# Patient Record
Sex: Female | Born: 1945 | Race: White | Hispanic: No | Smoking: Never smoker
Health system: Southern US, Community
[De-identification: ages and names within clinical notes are randomized; demographics above are authoritative.]

## PROBLEM LIST (undated history)

## (undated) DIAGNOSIS — R600 Localized edema: Secondary | ICD-10-CM

## (undated) DIAGNOSIS — B1910 Unspecified viral hepatitis B without hepatic coma: Secondary | ICD-10-CM

## (undated) DIAGNOSIS — J45909 Unspecified asthma, uncomplicated: Secondary | ICD-10-CM

## (undated) DIAGNOSIS — F419 Anxiety disorder, unspecified: Secondary | ICD-10-CM

## (undated) DIAGNOSIS — C801 Malignant (primary) neoplasm, unspecified: Secondary | ICD-10-CM

## (undated) DIAGNOSIS — K5790 Diverticulosis of intestine, part unspecified, without perforation or abscess without bleeding: Secondary | ICD-10-CM

## (undated) DIAGNOSIS — Z8719 Personal history of other diseases of the digestive system: Secondary | ICD-10-CM

## (undated) DIAGNOSIS — I10 Essential (primary) hypertension: Secondary | ICD-10-CM

## (undated) DIAGNOSIS — T7840XA Allergy, unspecified, initial encounter: Secondary | ICD-10-CM

## (undated) DIAGNOSIS — C8590 Non-Hodgkin lymphoma, unspecified, unspecified site: Secondary | ICD-10-CM

## (undated) DIAGNOSIS — I82629 Acute embolism and thrombosis of deep veins of unspecified upper extremity: Secondary | ICD-10-CM

## (undated) DIAGNOSIS — J069 Acute upper respiratory infection, unspecified: Secondary | ICD-10-CM

## (undated) DIAGNOSIS — K219 Gastro-esophageal reflux disease without esophagitis: Secondary | ICD-10-CM

## (undated) DIAGNOSIS — K259 Gastric ulcer, unspecified as acute or chronic, without hemorrhage or perforation: Secondary | ICD-10-CM

## (undated) HISTORY — DX: Allergy, unspecified, initial encounter: T78.40XA

## (undated) HISTORY — DX: Unspecified viral hepatitis B without hepatic coma: B19.10

## (undated) HISTORY — DX: Anxiety disorder, unspecified: F41.9

## (undated) HISTORY — PX: HEMORRHOID SURGERY: SHX153

## (undated) HISTORY — PX: PORTOCAVAL SHUNT PLACEMENT: SUR1035

## (undated) HISTORY — PX: COLONOSCOPY: SHX174

## (undated) HISTORY — DX: Acute upper respiratory infection, unspecified: J06.9

## (undated) HISTORY — DX: Essential (primary) hypertension: I10

## (undated) HISTORY — PX: CHOLECYSTECTOMY: SHX55

## (undated) HISTORY — PX: ESOPHAGOGASTRODUODENOSCOPY: SHX1529

---

## 1996-01-27 HISTORY — PX: LIVER BIOPSY: SHX301

## 2006-11-15 ENCOUNTER — Ambulatory Visit: Payer: Self-pay

## 2007-10-27 ENCOUNTER — Ambulatory Visit: Payer: Self-pay | Admitting: Oncology

## 2007-10-27 ENCOUNTER — Ambulatory Visit: Payer: Self-pay | Admitting: Internal Medicine

## 2007-10-27 DIAGNOSIS — K259 Gastric ulcer, unspecified as acute or chronic, without hemorrhage or perforation: Secondary | ICD-10-CM

## 2007-10-27 DIAGNOSIS — C8590 Non-Hodgkin lymphoma, unspecified, unspecified site: Secondary | ICD-10-CM | POA: Insufficient documentation

## 2007-10-27 HISTORY — DX: Gastric ulcer, unspecified as acute or chronic, without hemorrhage or perforation: K25.9

## 2007-11-10 ENCOUNTER — Inpatient Hospital Stay: Payer: Self-pay | Admitting: Internal Medicine

## 2007-11-10 ENCOUNTER — Other Ambulatory Visit: Payer: Self-pay

## 2007-11-27 ENCOUNTER — Ambulatory Visit: Payer: Self-pay | Admitting: Oncology

## 2007-12-05 ENCOUNTER — Ambulatory Visit: Payer: Self-pay | Admitting: Internal Medicine

## 2007-12-27 ENCOUNTER — Ambulatory Visit: Payer: Self-pay | Admitting: Oncology

## 2007-12-27 ENCOUNTER — Ambulatory Visit: Payer: Self-pay | Admitting: Internal Medicine

## 2008-01-04 ENCOUNTER — Emergency Department: Payer: Self-pay | Admitting: Emergency Medicine

## 2008-01-04 ENCOUNTER — Other Ambulatory Visit: Payer: Self-pay

## 2008-01-27 ENCOUNTER — Ambulatory Visit: Payer: Self-pay | Admitting: Internal Medicine

## 2008-01-27 ENCOUNTER — Ambulatory Visit: Payer: Self-pay | Admitting: Oncology

## 2008-02-15 ENCOUNTER — Inpatient Hospital Stay: Payer: Self-pay | Admitting: Internal Medicine

## 2008-02-27 ENCOUNTER — Ambulatory Visit: Payer: Self-pay | Admitting: Internal Medicine

## 2008-02-27 ENCOUNTER — Ambulatory Visit: Payer: Self-pay | Admitting: Oncology

## 2008-03-28 ENCOUNTER — Ambulatory Visit: Payer: Self-pay | Admitting: Internal Medicine

## 2008-03-28 ENCOUNTER — Ambulatory Visit: Payer: Self-pay | Admitting: Oncology

## 2008-04-13 ENCOUNTER — Emergency Department: Payer: Self-pay | Admitting: Emergency Medicine

## 2008-04-28 ENCOUNTER — Ambulatory Visit: Payer: Self-pay | Admitting: Oncology

## 2008-04-28 ENCOUNTER — Ambulatory Visit: Payer: Self-pay | Admitting: Internal Medicine

## 2008-05-27 ENCOUNTER — Ambulatory Visit: Payer: Self-pay | Admitting: Internal Medicine

## 2008-05-28 ENCOUNTER — Ambulatory Visit: Payer: Self-pay | Admitting: Internal Medicine

## 2008-06-18 ENCOUNTER — Emergency Department: Payer: Self-pay | Admitting: Emergency Medicine

## 2008-06-22 ENCOUNTER — Emergency Department: Payer: Self-pay | Admitting: Emergency Medicine

## 2008-06-28 ENCOUNTER — Ambulatory Visit: Payer: Self-pay | Admitting: Internal Medicine

## 2008-07-23 ENCOUNTER — Ambulatory Visit: Payer: Self-pay | Admitting: Unknown Physician Specialty

## 2008-07-29 ENCOUNTER — Ambulatory Visit: Payer: Self-pay | Admitting: Internal Medicine

## 2008-08-05 ENCOUNTER — Ambulatory Visit: Payer: Self-pay | Admitting: Unknown Physician Specialty

## 2008-08-26 ENCOUNTER — Ambulatory Visit: Payer: Self-pay | Admitting: Internal Medicine

## 2008-09-03 ENCOUNTER — Ambulatory Visit: Payer: Self-pay | Admitting: Family Medicine

## 2008-09-26 ENCOUNTER — Ambulatory Visit: Payer: Self-pay | Admitting: Internal Medicine

## 2008-09-30 ENCOUNTER — Ambulatory Visit: Payer: Self-pay | Admitting: Unknown Physician Specialty

## 2008-10-14 ENCOUNTER — Ambulatory Visit: Payer: Self-pay | Admitting: Unknown Physician Specialty

## 2008-10-26 ENCOUNTER — Ambulatory Visit: Payer: Self-pay | Admitting: Internal Medicine

## 2008-11-26 ENCOUNTER — Ambulatory Visit: Payer: Self-pay | Admitting: Internal Medicine

## 2009-01-26 ENCOUNTER — Ambulatory Visit: Payer: Self-pay | Admitting: Internal Medicine

## 2009-01-26 ENCOUNTER — Emergency Department: Payer: Self-pay | Admitting: Emergency Medicine

## 2009-02-11 ENCOUNTER — Ambulatory Visit: Payer: Self-pay | Admitting: Internal Medicine

## 2009-02-26 ENCOUNTER — Ambulatory Visit: Payer: Self-pay | Admitting: Internal Medicine

## 2009-03-11 ENCOUNTER — Ambulatory Visit: Payer: Self-pay | Admitting: Surgery

## 2009-03-26 ENCOUNTER — Ambulatory Visit: Payer: Self-pay | Admitting: Family Medicine

## 2009-03-28 ENCOUNTER — Ambulatory Visit: Payer: Self-pay | Admitting: Internal Medicine

## 2009-05-05 ENCOUNTER — Ambulatory Visit: Payer: Self-pay | Admitting: Internal Medicine

## 2009-05-14 ENCOUNTER — Ambulatory Visit: Payer: Self-pay | Admitting: Podiatry

## 2009-05-28 ENCOUNTER — Ambulatory Visit: Payer: Self-pay | Admitting: Internal Medicine

## 2009-06-05 ENCOUNTER — Ambulatory Visit: Payer: Self-pay | Admitting: Unknown Physician Specialty

## 2009-06-28 ENCOUNTER — Ambulatory Visit: Payer: Self-pay | Admitting: Internal Medicine

## 2009-07-29 ENCOUNTER — Ambulatory Visit: Payer: Self-pay | Admitting: Internal Medicine

## 2009-08-26 ENCOUNTER — Ambulatory Visit: Payer: Self-pay | Admitting: Internal Medicine

## 2009-09-03 ENCOUNTER — Ambulatory Visit: Payer: Self-pay | Admitting: Unknown Physician Specialty

## 2009-09-15 ENCOUNTER — Ambulatory Visit: Payer: Self-pay | Admitting: Internal Medicine

## 2009-09-26 ENCOUNTER — Ambulatory Visit: Payer: Self-pay | Admitting: Internal Medicine

## 2009-12-26 ENCOUNTER — Ambulatory Visit: Payer: Self-pay | Admitting: Internal Medicine

## 2010-01-19 ENCOUNTER — Ambulatory Visit: Payer: Self-pay | Admitting: Internal Medicine

## 2010-01-26 ENCOUNTER — Ambulatory Visit: Payer: Self-pay | Admitting: Internal Medicine

## 2010-07-27 ENCOUNTER — Ambulatory Visit: Payer: Self-pay | Admitting: Internal Medicine

## 2010-07-29 ENCOUNTER — Ambulatory Visit: Payer: Self-pay | Admitting: Internal Medicine

## 2010-10-05 ENCOUNTER — Ambulatory Visit: Payer: Self-pay | Admitting: Internal Medicine

## 2010-11-19 ENCOUNTER — Ambulatory Visit: Payer: Self-pay | Admitting: Family Medicine

## 2011-01-25 ENCOUNTER — Ambulatory Visit: Payer: Self-pay | Admitting: Internal Medicine

## 2011-01-27 ENCOUNTER — Ambulatory Visit: Payer: Self-pay | Admitting: Internal Medicine

## 2011-02-27 ENCOUNTER — Ambulatory Visit: Payer: Self-pay | Admitting: Internal Medicine

## 2011-03-27 DIAGNOSIS — B181 Chronic viral hepatitis B without delta-agent: Secondary | ICD-10-CM | POA: Insufficient documentation

## 2011-09-20 ENCOUNTER — Ambulatory Visit: Payer: Self-pay | Admitting: Internal Medicine

## 2011-09-20 LAB — CBC CANCER CENTER
Basophil #: 0 x10 3/mm (ref 0.0–0.1)
Basophil %: 0.3 %
Eosinophil #: 0.1 x10 3/mm (ref 0.0–0.7)
Eosinophil %: 2.1 %
HCT: 39 % (ref 35.0–47.0)
HGB: 13.4 g/dL (ref 12.0–16.0)
Lymphocyte #: 1.4 x10 3/mm (ref 1.0–3.6)
Lymphocyte %: 33.5 %
MCH: 32 pg (ref 26.0–34.0)
MCHC: 34.3 g/dL (ref 32.0–36.0)
MCV: 93 fL (ref 80–100)
Monocyte #: 0.4 x10 3/mm (ref 0.0–0.7)
Monocyte %: 8.8 %
Neutrophil #: 2.3 x10 3/mm (ref 1.4–6.5)
Neutrophil %: 55.3 %
Platelet: 164 x10 3/mm (ref 150–440)
RBC: 4.18 10*6/uL (ref 3.80–5.20)
RDW: 13.3 % (ref 11.5–14.5)
WBC: 4.1 x10 3/mm (ref 3.6–11.0)

## 2011-09-20 LAB — HEPATIC FUNCTION PANEL A (ARMC)
Albumin: 3.7 g/dL (ref 3.4–5.0)
Alkaline Phosphatase: 130 U/L (ref 50–136)
Bilirubin, Direct: 0.1 mg/dL (ref 0.00–0.20)
Bilirubin,Total: 0.2 mg/dL (ref 0.2–1.0)
SGOT(AST): 23 U/L (ref 15–37)
SGPT (ALT): 22 U/L
Total Protein: 7.2 g/dL (ref 6.4–8.2)

## 2011-09-20 LAB — LACTATE DEHYDROGENASE: LDH: 150 U/L (ref 84–246)

## 2011-09-20 LAB — CREATININE, SERUM
Creatinine: 0.89 mg/dL (ref 0.60–1.30)
EGFR (African American): >60
EGFR (Non-African Amer.): >60

## 2011-09-27 ENCOUNTER — Ambulatory Visit: Payer: Self-pay | Admitting: Internal Medicine

## 2012-01-20 ENCOUNTER — Ambulatory Visit: Payer: Self-pay | Admitting: Family Medicine

## 2012-01-29 ENCOUNTER — Emergency Department: Payer: Self-pay | Admitting: *Deleted

## 2012-03-20 ENCOUNTER — Ambulatory Visit: Payer: Self-pay | Admitting: Internal Medicine

## 2012-03-20 LAB — CBC CANCER CENTER
Basophil #: 0 x10 3/mm (ref 0.0–0.1)
Basophil %: 0.5 %
Eosinophil #: 0.1 x10 3/mm (ref 0.0–0.7)
Eosinophil %: 2.9 %
HCT: 42.3 % (ref 35.0–47.0)
HGB: 14 g/dL (ref 12.0–16.0)
Lymphocyte #: 1.4 x10 3/mm (ref 1.0–3.6)
Lymphocyte %: 32.9 %
MCH: 31 pg (ref 26.0–34.0)
MCHC: 33.1 g/dL (ref 32.0–36.0)
MCV: 94 fL (ref 80–100)
Monocyte #: 0.4 x10 3/mm (ref 0.2–0.9)
Monocyte %: 8.9 %
Neutrophil #: 2.3 x10 3/mm (ref 1.4–6.5)
Neutrophil %: 54.8 %
Platelet: 159 x10 3/mm (ref 150–440)
RBC: 4.52 10*6/uL (ref 3.80–5.20)
RDW: 12.7 % (ref 11.5–14.5)
WBC: 4.3 x10 3/mm (ref 3.6–11.0)

## 2012-03-20 LAB — HEPATIC FUNCTION PANEL A (ARMC)
Albumin: 3.8 g/dL (ref 3.4–5.0)
Alkaline Phosphatase: 131 U/L (ref 50–136)
Bilirubin, Direct: 0.1 mg/dL (ref 0.00–0.20)
Bilirubin,Total: 0.3 mg/dL (ref 0.2–1.0)
SGOT(AST): 31 U/L (ref 15–37)
SGPT (ALT): 28 U/L (ref 12–78)
Total Protein: 7.7 g/dL (ref 6.4–8.2)

## 2012-03-20 LAB — CREATININE, SERUM
Creatinine: 0.95 mg/dL (ref 0.60–1.30)
EGFR (African American): >60
EGFR (Non-African Amer.): >60

## 2012-03-20 LAB — LACTATE DEHYDROGENASE: LDH: 154 U/L (ref 81–246)

## 2012-03-28 ENCOUNTER — Ambulatory Visit: Payer: Self-pay | Admitting: Internal Medicine

## 2012-05-21 ENCOUNTER — Emergency Department: Payer: Self-pay | Admitting: Emergency Medicine

## 2012-05-24 ENCOUNTER — Emergency Department: Payer: Self-pay | Admitting: Emergency Medicine

## 2012-05-24 LAB — BASIC METABOLIC PANEL
Anion Gap: 6 — ABNORMAL LOW (ref 7–16)
BUN: 6 mg/dL — ABNORMAL LOW (ref 7–18)
Calcium, Total: 8.7 mg/dL (ref 8.5–10.1)
Chloride: 111 mmol/L — ABNORMAL HIGH (ref 98–107)
Co2: 24 mmol/L (ref 21–32)
Creatinine: 0.88 mg/dL (ref 0.60–1.30)
EGFR (African American): >60
EGFR (Non-African Amer.): >60
Glucose: 124 mg/dL — ABNORMAL HIGH (ref 65–99)
Osmolality: 280 (ref 275–301)
Potassium: 4.5 mmol/L (ref 3.5–5.1)
Sodium: 141 mmol/L (ref 136–145)

## 2012-05-24 LAB — CBC
HCT: 37.9 % (ref 35.0–47.0)
HGB: 12.8 g/dL (ref 12.0–16.0)
MCH: 31.1 pg (ref 26.0–34.0)
MCHC: 33.9 g/dL (ref 32.0–36.0)
MCV: 92 fL (ref 80–100)
Platelet: 145 10*3/uL — ABNORMAL LOW (ref 150–440)
RBC: 4.12 10*6/uL (ref 3.80–5.20)
RDW: 13.3 % (ref 11.5–14.5)
WBC: 3.5 10*3/uL — ABNORMAL LOW (ref 3.6–11.0)

## 2012-05-24 LAB — CK TOTAL AND CKMB (NOT AT ARMC)
CK, Total: 279 U/L — ABNORMAL HIGH (ref 21–215)
CK-MB: 1.6 ng/mL (ref 0.5–3.6)

## 2012-05-24 LAB — TROPONIN I: Troponin-I: <0.02 ng/mL

## 2012-10-26 ENCOUNTER — Ambulatory Visit: Payer: Self-pay | Admitting: Internal Medicine

## 2012-11-17 LAB — CBC CANCER CENTER
Basophil #: 0 x10 3/mm (ref 0.0–0.1)
Basophil %: 0.6 %
Eosinophil #: 0.2 x10 3/mm (ref 0.0–0.7)
Eosinophil %: 4.2 %
HCT: 39.3 % (ref 35.0–47.0)
HGB: 13.4 g/dL (ref 12.0–16.0)
Lymphocyte #: 1.1 x10 3/mm (ref 1.0–3.6)
Lymphocyte %: 27 %
MCH: 31.4 pg (ref 26.0–34.0)
MCHC: 34.1 g/dL (ref 32.0–36.0)
MCV: 92 fL (ref 80–100)
Monocyte #: 0.5 x10 3/mm (ref 0.2–0.9)
Monocyte %: 11.4 %
Neutrophil #: 2.3 x10 3/mm (ref 1.4–6.5)
Neutrophil %: 56.8 %
Platelet: 176 x10 3/mm (ref 150–440)
RBC: 4.28 10*6/uL (ref 3.80–5.20)
RDW: 12.8 % (ref 11.5–14.5)
WBC: 4.1 x10 3/mm (ref 3.6–11.0)

## 2012-11-17 LAB — HEPATIC FUNCTION PANEL A (ARMC)
Albumin: 3.6 g/dL (ref 3.4–5.0)
Alkaline Phosphatase: 129 U/L (ref 50–136)
Bilirubin, Direct: 0.1 mg/dL (ref 0.00–0.20)
Bilirubin,Total: 0.3 mg/dL (ref 0.2–1.0)
SGOT(AST): 22 U/L (ref 15–37)
SGPT (ALT): 22 U/L (ref 12–78)
Total Protein: 7.3 g/dL (ref 6.4–8.2)

## 2012-11-17 LAB — CREATININE, SERUM
Creatinine: 0.97 mg/dL (ref 0.60–1.30)
EGFR (African American): >60
EGFR (Non-African Amer.): >60

## 2012-11-17 LAB — LACTATE DEHYDROGENASE: LDH: 153 U/L (ref 81–246)

## 2012-11-26 ENCOUNTER — Ambulatory Visit: Payer: Self-pay | Admitting: Internal Medicine

## 2013-01-23 ENCOUNTER — Ambulatory Visit: Payer: Self-pay | Admitting: Family Medicine

## 2013-02-09 ENCOUNTER — Emergency Department: Payer: Self-pay | Admitting: Emergency Medicine

## 2013-07-30 ENCOUNTER — Ambulatory Visit: Payer: Self-pay | Admitting: Internal Medicine

## 2013-07-31 LAB — CBC CANCER CENTER
Basophil #: 0 x10 3/mm (ref 0.0–0.1)
Basophil %: 0.5 %
Eosinophil #: 0.1 x10 3/mm (ref 0.0–0.7)
Eosinophil %: 1.9 %
HCT: 38.7 % (ref 35.0–47.0)
HGB: 12.9 g/dL (ref 12.0–16.0)
Lymphocyte #: 1.1 x10 3/mm (ref 1.0–3.6)
Lymphocyte %: 22.8 %
MCH: 30.5 pg (ref 26.0–34.0)
MCHC: 33.4 g/dL (ref 32.0–36.0)
MCV: 92 fL (ref 80–100)
Monocyte #: 0.5 x10 3/mm (ref 0.2–0.9)
Monocyte %: 9.8 %
Neutrophil #: 3 x10 3/mm (ref 1.4–6.5)
Neutrophil %: 65 %
Platelet: 168 x10 3/mm (ref 150–440)
RBC: 4.22 10*6/uL (ref 3.80–5.20)
RDW: 12.9 % (ref 11.5–14.5)
WBC: 4.6 x10 3/mm (ref 3.6–11.0)

## 2013-07-31 LAB — PROTIME-INR
INR: 1
Prothrombin Time: 12.6 secs (ref 11.5–14.7)

## 2013-07-31 LAB — HEPATIC FUNCTION PANEL A (ARMC)
Albumin: 3.4 g/dL (ref 3.4–5.0)
Alkaline Phosphatase: 111 U/L
Bilirubin, Direct: 0.1 mg/dL (ref 0.00–0.20)
Bilirubin,Total: 0.3 mg/dL (ref 0.2–1.0)
SGOT(AST): 23 U/L (ref 15–37)
SGPT (ALT): 19 U/L (ref 12–78)
Total Protein: 6.5 g/dL (ref 6.4–8.2)

## 2013-07-31 LAB — CREATININE, SERUM
Creatinine: 1.14 mg/dL (ref 0.60–1.30)
EGFR (African American): 58 — ABNORMAL LOW
EGFR (Non-African Amer.): 50 — ABNORMAL LOW

## 2013-07-31 LAB — LACTATE DEHYDROGENASE: LDH: 295 U/L — ABNORMAL HIGH (ref 81–246)

## 2013-07-31 LAB — APTT: Activated PTT: 28 secs (ref 23.6–35.9)

## 2013-08-06 ENCOUNTER — Ambulatory Visit: Payer: Self-pay | Admitting: Internal Medicine

## 2013-08-13 ENCOUNTER — Ambulatory Visit: Payer: Self-pay | Admitting: Surgery

## 2013-08-13 LAB — CBC WITH DIFFERENTIAL/PLATELET
Basophil #: 0 10*3/uL (ref 0.0–0.1)
Basophil %: 0.8 %
Eosinophil #: 0.1 10*3/uL (ref 0.0–0.7)
Eosinophil %: 2.4 %
HCT: 36.4 % (ref 35.0–47.0)
HGB: 12.6 g/dL (ref 12.0–16.0)
Lymphocyte #: 1.2 10*3/uL (ref 1.0–3.6)
Lymphocyte %: 25.9 %
MCH: 31.9 pg (ref 26.0–34.0)
MCHC: 34.6 g/dL (ref 32.0–36.0)
MCV: 92 fL (ref 80–100)
Monocyte #: 0.4 x10 3/mm (ref 0.2–0.9)
Monocyte %: 9.4 %
Neutrophil #: 2.9 10*3/uL (ref 1.4–6.5)
Neutrophil %: 61.5 %
Platelet: 199 10*3/uL (ref 150–440)
RBC: 3.96 10*6/uL (ref 3.80–5.20)
RDW: 13.2 % (ref 11.5–14.5)
WBC: 4.7 10*3/uL (ref 3.6–11.0)

## 2013-08-17 ENCOUNTER — Ambulatory Visit: Payer: Self-pay | Admitting: Surgery

## 2013-08-24 LAB — PATHOLOGY REPORT

## 2013-08-26 ENCOUNTER — Ambulatory Visit: Payer: Self-pay | Admitting: Internal Medicine

## 2013-08-29 LAB — CBC CANCER CENTER
Basophil: 2 %
Comment - H1-Com1: NORMAL
Eosinophil: 4 %
HCT: 37.8 % (ref 35.0–47.0)
HGB: 12.3 g/dL (ref 12.0–16.0)
Lymphocytes: 19 %
MCH: 29.8 pg (ref 26.0–34.0)
MCHC: 32.6 g/dL (ref 32.0–36.0)
MCV: 91 fL (ref 80–100)
Monocytes: 7 %
Platelet: 212 x10 3/mm (ref 150–440)
RBC: 4.14 10*6/uL (ref 3.80–5.20)
RDW: 12.7 % (ref 11.5–14.5)
Segmented Neutrophils: 64 %
Variant Lymphocyte: 4 %
WBC: 5.3 x10 3/mm (ref 3.6–11.0)

## 2013-09-03 LAB — CBC CANCER CENTER
Basophil #: 0 x10 3/mm (ref 0.0–0.1)
Basophil %: 0.6 %
Eosinophil #: 0.1 x10 3/mm (ref 0.0–0.7)
Eosinophil %: 1.5 %
HCT: 35.4 % (ref 35.0–47.0)
HGB: 12 g/dL (ref 12.0–16.0)
Lymphocyte #: 1.4 x10 3/mm (ref 1.0–3.6)
Lymphocyte %: 28 %
MCH: 30.8 pg (ref 26.0–34.0)
MCHC: 33.9 g/dL (ref 32.0–36.0)
MCV: 91 fL (ref 80–100)
Monocyte #: 0.6 x10 3/mm (ref 0.2–0.9)
Monocyte %: 12.5 %
Neutrophil #: 2.8 x10 3/mm (ref 1.4–6.5)
Neutrophil %: 57.4 %
Platelet: 225 x10 3/mm (ref 150–440)
RBC: 3.91 10*6/uL (ref 3.80–5.20)
RDW: 12.6 % (ref 11.5–14.5)
WBC: 4.9 x10 3/mm (ref 3.6–11.0)

## 2013-09-03 LAB — COMPREHENSIVE METABOLIC PANEL
Albumin: 3.2 g/dL — ABNORMAL LOW (ref 3.4–5.0)
Alkaline Phosphatase: 136 U/L — ABNORMAL HIGH
Anion Gap: 10 (ref 7–16)
BUN: 16 mg/dL (ref 7–18)
Bilirubin,Total: 0.3 mg/dL (ref 0.2–1.0)
Calcium, Total: 9.4 mg/dL (ref 8.5–10.1)
Chloride: 98 mmol/L (ref 98–107)
Co2: 30 mmol/L (ref 21–32)
Creatinine: 1.01 mg/dL (ref 0.60–1.30)
EGFR (African American): >60
EGFR (Non-African Amer.): 58 — ABNORMAL LOW
Glucose: 104 mg/dL — ABNORMAL HIGH (ref 65–99)
Osmolality: 277 (ref 275–301)
Potassium: 4 mmol/L (ref 3.5–5.1)
SGOT(AST): 22 U/L (ref 15–37)
SGPT (ALT): 12 U/L (ref 12–78)
Sodium: 138 mmol/L (ref 136–145)
Total Protein: 7 g/dL (ref 6.4–8.2)

## 2013-09-03 LAB — URIC ACID: Uric Acid: 3.7 mg/dL (ref 2.6–6.0)

## 2013-09-06 LAB — URINALYSIS, COMPLETE
Bacteria: NONE SEEN
Bilirubin,UR: NEGATIVE
Blood: NEGATIVE
Glucose,UR: NEGATIVE mg/dL (ref 0–75)
Hyaline Cast: 1
Ketone: NEGATIVE
Nitrite: NEGATIVE
Ph: 6 (ref 4.5–8.0)
Protein: NEGATIVE
RBC,UR: 1 /HPF (ref 0–5)
Specific Gravity: 1.023 (ref 1.003–1.030)
Squamous Epithelial: 2
WBC UR: 7 /HPF (ref 0–5)

## 2013-09-07 LAB — URINALYSIS, COMPLETE
Bacteria: NONE SEEN
Bilirubin,UR: NEGATIVE
Blood: NEGATIVE
Glucose,UR: NEGATIVE mg/dL (ref 0–75)
Hyaline Cast: 3
Nitrite: NEGATIVE
Ph: 5 (ref 4.5–8.0)
Protein: NEGATIVE
RBC,UR: 1 /HPF (ref 0–5)
Specific Gravity: 1.03 (ref 1.003–1.030)
Squamous Epithelial: 3
WBC UR: 7 /HPF (ref 0–5)

## 2013-09-10 LAB — CBC CANCER CENTER
Basophil #: 0 x10 3/mm (ref 0.0–0.1)
Basophil %: 0.1 %
Eosinophil #: 0.1 x10 3/mm (ref 0.0–0.7)
Eosinophil %: 0.5 %
HCT: 40.4 % (ref 35.0–47.0)
HGB: 13.2 g/dL (ref 12.0–16.0)
Lymphocyte #: 0.7 x10 3/mm — ABNORMAL LOW (ref 1.0–3.6)
Lymphocyte %: 5.2 %
MCH: 29.8 pg (ref 26.0–34.0)
MCHC: 32.6 g/dL (ref 32.0–36.0)
MCV: 91 fL (ref 80–100)
Monocyte #: 0.1 x10 3/mm — ABNORMAL LOW (ref 0.2–0.9)
Monocyte %: 0.9 %
Neutrophil #: 12 x10 3/mm — ABNORMAL HIGH (ref 1.4–6.5)
Neutrophil %: 93.3 %
Platelet: 227 x10 3/mm (ref 150–440)
RBC: 4.41 10*6/uL (ref 3.80–5.20)
RDW: 12.7 % (ref 11.5–14.5)
WBC: 12.9 x10 3/mm — ABNORMAL HIGH (ref 3.6–11.0)

## 2013-09-10 LAB — BASIC METABOLIC PANEL
Anion Gap: 6 — ABNORMAL LOW (ref 7–16)
BUN: 24 mg/dL — ABNORMAL HIGH (ref 7–18)
Calcium, Total: 10.3 mg/dL — ABNORMAL HIGH (ref 8.5–10.1)
Chloride: 100 mmol/L (ref 98–107)
Co2: 27 mmol/L (ref 21–32)
Creatinine: 1.25 mg/dL (ref 0.60–1.30)
EGFR (African American): 52 — ABNORMAL LOW
EGFR (Non-African Amer.): 45 — ABNORMAL LOW
Glucose: 104 mg/dL — ABNORMAL HIGH (ref 65–99)
Osmolality: 271 (ref 275–301)
Potassium: 4.1 mmol/L (ref 3.5–5.1)
Sodium: 133 mmol/L — ABNORMAL LOW (ref 136–145)

## 2013-09-10 LAB — URIC ACID: Uric Acid: 4 mg/dL (ref 2.6–6.0)

## 2013-09-13 LAB — BASIC METABOLIC PANEL
Anion Gap: 9 (ref 7–16)
BUN: 20 mg/dL — ABNORMAL HIGH (ref 7–18)
Calcium, Total: 8.8 mg/dL (ref 8.5–10.1)
Chloride: 97 mmol/L — ABNORMAL LOW (ref 98–107)
Co2: 29 mmol/L (ref 21–32)
Creatinine: 1 mg/dL (ref 0.60–1.30)
EGFR (African American): >60
EGFR (Non-African Amer.): 59 — ABNORMAL LOW
Glucose: 110 mg/dL — ABNORMAL HIGH (ref 65–99)
Osmolality: 273 (ref 275–301)
Potassium: 3.9 mmol/L (ref 3.5–5.1)
Sodium: 135 mmol/L — ABNORMAL LOW (ref 136–145)

## 2013-09-17 LAB — CBC CANCER CENTER
Basophil #: 0 x10 3/mm (ref 0.0–0.1)
Basophil %: 0.1 %
Eosinophil #: 0 x10 3/mm (ref 0.0–0.7)
Eosinophil %: 0.1 %
HCT: 30.2 % — ABNORMAL LOW (ref 35.0–47.0)
HGB: 10 g/dL — ABNORMAL LOW (ref 12.0–16.0)
Lymphocyte #: 0.7 x10 3/mm — ABNORMAL LOW (ref 1.0–3.6)
Lymphocyte %: 12.4 %
MCH: 29.9 pg (ref 26.0–34.0)
MCHC: 33.1 g/dL (ref 32.0–36.0)
MCV: 90 fL (ref 80–100)
Monocyte #: 0.9 x10 3/mm (ref 0.2–0.9)
Monocyte %: 17.5 %
Neutrophil #: 3.8 x10 3/mm (ref 1.4–6.5)
Neutrophil %: 69.9 %
Platelet: 31 x10 3/mm — ABNORMAL LOW (ref 150–440)
RBC: 3.35 10*6/uL — ABNORMAL LOW (ref 3.80–5.20)
RDW: 12.3 % (ref 11.5–14.5)
WBC: 5.4 x10 3/mm (ref 3.6–11.0)

## 2013-09-17 LAB — BASIC METABOLIC PANEL
Anion Gap: 9 (ref 7–16)
BUN: 10 mg/dL (ref 7–18)
Calcium, Total: 8.9 mg/dL (ref 8.5–10.1)
Chloride: 102 mmol/L (ref 98–107)
Co2: 29 mmol/L (ref 21–32)
Creatinine: 0.81 mg/dL (ref 0.60–1.30)
EGFR (African American): >60
EGFR (Non-African Amer.): >60
Glucose: 114 mg/dL — ABNORMAL HIGH (ref 65–99)
Osmolality: 279 (ref 275–301)
Potassium: 3.7 mmol/L (ref 3.5–5.1)
Sodium: 140 mmol/L (ref 136–145)

## 2013-09-17 LAB — HEPATIC FUNCTION PANEL A (ARMC)
Albumin: 2.9 g/dL — ABNORMAL LOW (ref 3.4–5.0)
Alkaline Phosphatase: 122 U/L — ABNORMAL HIGH
Bilirubin, Direct: <0.1 mg/dL (ref 0.00–0.20)
Bilirubin,Total: <0.1 mg/dL — ABNORMAL LOW (ref 0.2–1.0)
SGOT(AST): 12 U/L — ABNORMAL LOW (ref 15–37)
SGPT (ALT): 16 U/L (ref 12–78)
Total Protein: 6.7 g/dL (ref 6.4–8.2)

## 2013-09-17 LAB — MAGNESIUM: Magnesium: 1.9 mg/dL

## 2013-09-24 LAB — CBC CANCER CENTER
Basophil #: 0 x10 3/mm (ref 0.0–0.1)
Basophil %: 0.3 %
Eosinophil #: 0 x10 3/mm (ref 0.0–0.7)
Eosinophil %: 0.1 %
HCT: 28.9 % — ABNORMAL LOW (ref 35.0–47.0)
HGB: 9.8 g/dL — ABNORMAL LOW (ref 12.0–16.0)
Lymphocyte #: 1.1 x10 3/mm (ref 1.0–3.6)
Lymphocyte %: 15.1 %
MCH: 30.4 pg (ref 26.0–34.0)
MCHC: 33.9 g/dL (ref 32.0–36.0)
MCV: 90 fL (ref 80–100)
Monocyte #: 1.2 x10 3/mm — ABNORMAL HIGH (ref 0.2–0.9)
Monocyte %: 16.3 %
Neutrophil #: 4.9 x10 3/mm (ref 1.4–6.5)
Neutrophil %: 68.2 %
Platelet: 303 x10 3/mm (ref 150–440)
RBC: 3.22 10*6/uL — ABNORMAL LOW (ref 3.80–5.20)
RDW: 12.5 % (ref 11.5–14.5)
WBC: 7.2 x10 3/mm (ref 3.6–11.0)

## 2013-09-24 LAB — BASIC METABOLIC PANEL
Anion Gap: 8 (ref 7–16)
BUN: 12 mg/dL (ref 7–18)
Calcium, Total: 9.1 mg/dL (ref 8.5–10.1)
Chloride: 102 mmol/L (ref 98–107)
Co2: 29 mmol/L (ref 21–32)
Creatinine: 0.83 mg/dL (ref 0.60–1.30)
EGFR (African American): >60
EGFR (Non-African Amer.): >60
Glucose: 110 mg/dL — ABNORMAL HIGH (ref 65–99)
Osmolality: 278 (ref 275–301)
Potassium: 4 mmol/L (ref 3.5–5.1)
Sodium: 139 mmol/L (ref 136–145)

## 2013-09-26 ENCOUNTER — Ambulatory Visit: Payer: Self-pay | Admitting: Internal Medicine

## 2013-09-27 ENCOUNTER — Inpatient Hospital Stay: Payer: Self-pay | Admitting: Internal Medicine

## 2013-09-27 LAB — URINALYSIS, COMPLETE
Bacteria: NONE SEEN
Bilirubin,UR: NEGATIVE
Blood: NEGATIVE
Glucose,UR: NEGATIVE mg/dL (ref 0–75)
Ketone: NEGATIVE
Leukocyte Esterase: NEGATIVE
Nitrite: NEGATIVE
Ph: 7 (ref 4.5–8.0)
Protein: 100
RBC,UR: <1 /HPF (ref 0–5)
Specific Gravity: 1.018 (ref 1.003–1.030)
Squamous Epithelial: 1
WBC UR: 7 /HPF (ref 0–5)

## 2013-09-27 LAB — CBC WITH DIFFERENTIAL/PLATELET
Basophil #: 0 10*3/uL (ref 0.0–0.1)
Basophil %: 0.5 %
Eosinophil #: 0 10*3/uL (ref 0.0–0.7)
Eosinophil %: 0.1 %
HCT: 29.3 % — ABNORMAL LOW (ref 35.0–47.0)
HGB: 9.9 g/dL — ABNORMAL LOW (ref 12.0–16.0)
Lymphocyte #: 0.1 10*3/uL — ABNORMAL LOW (ref 1.0–3.6)
Lymphocyte %: 1.3 %
MCH: 30.5 pg (ref 26.0–34.0)
MCHC: 33.6 g/dL (ref 32.0–36.0)
MCV: 91 fL (ref 80–100)
Monocyte #: 0.1 x10 3/mm — ABNORMAL LOW (ref 0.2–0.9)
Monocyte %: 1.3 %
Neutrophil #: 8.2 10*3/uL — ABNORMAL HIGH (ref 1.4–6.5)
Neutrophil %: 96.8 %
Platelet: 553 10*3/uL — ABNORMAL HIGH (ref 150–440)
RBC: 3.23 10*6/uL — ABNORMAL LOW (ref 3.80–5.20)
RDW: 12.5 % (ref 11.5–14.5)
WBC: 8.5 10*3/uL (ref 3.6–11.0)

## 2013-09-27 LAB — COMPREHENSIVE METABOLIC PANEL
Albumin: 3 g/dL — ABNORMAL LOW (ref 3.4–5.0)
Alkaline Phosphatase: 104 U/L
Anion Gap: 5 — ABNORMAL LOW (ref 7–16)
BUN: 24 mg/dL — ABNORMAL HIGH (ref 7–18)
Bilirubin,Total: 0.2 mg/dL (ref 0.2–1.0)
Calcium, Total: 8.8 mg/dL (ref 8.5–10.1)
Chloride: 104 mmol/L (ref 98–107)
Co2: 28 mmol/L (ref 21–32)
Creatinine: 0.83 mg/dL (ref 0.60–1.30)
EGFR (African American): >60
EGFR (Non-African Amer.): >60
Glucose: 106 mg/dL — ABNORMAL HIGH (ref 65–99)
Osmolality: 278 (ref 275–301)
Potassium: 3.3 mmol/L — ABNORMAL LOW (ref 3.5–5.1)
SGOT(AST): 32 U/L (ref 15–37)
SGPT (ALT): 28 U/L (ref 12–78)
Sodium: 137 mmol/L (ref 136–145)
Total Protein: 6.5 g/dL (ref 6.4–8.2)

## 2013-09-27 LAB — TROPONIN I: Troponin-I: <0.02 ng/mL

## 2013-09-27 LAB — LIPASE, BLOOD: Lipase: 93 U/L (ref 73–393)

## 2013-09-28 LAB — BASIC METABOLIC PANEL
Anion Gap: 6 — ABNORMAL LOW (ref 7–16)
BUN: 18 mg/dL (ref 7–18)
Calcium, Total: 8.3 mg/dL — ABNORMAL LOW (ref 8.5–10.1)
Chloride: 109 mmol/L — ABNORMAL HIGH (ref 98–107)
Co2: 25 mmol/L (ref 21–32)
Creatinine: 1 mg/dL (ref 0.60–1.30)
EGFR (African American): >60
EGFR (Non-African Amer.): 59 — ABNORMAL LOW
Glucose: 77 mg/dL (ref 65–99)
Osmolality: 280 (ref 275–301)
Potassium: 3.8 mmol/L (ref 3.5–5.1)
Sodium: 140 mmol/L (ref 136–145)

## 2013-09-28 LAB — MAGNESIUM: Magnesium: 1.9 mg/dL

## 2013-09-29 LAB — URINE CULTURE

## 2013-09-29 LAB — BASIC METABOLIC PANEL
Anion Gap: 7 (ref 7–16)
BUN: 10 mg/dL (ref 7–18)
Calcium, Total: 8.4 mg/dL — ABNORMAL LOW (ref 8.5–10.1)
Chloride: 114 mmol/L — ABNORMAL HIGH (ref 98–107)
Co2: 21 mmol/L (ref 21–32)
Creatinine: 0.98 mg/dL (ref 0.60–1.30)
EGFR (African American): >60
EGFR (Non-African Amer.): >60
Glucose: 83 mg/dL (ref 65–99)
Osmolality: 281 (ref 275–301)
Potassium: 3.7 mmol/L (ref 3.5–5.1)
Sodium: 142 mmol/L (ref 136–145)

## 2013-09-29 LAB — MAGNESIUM: Magnesium: 1.7 mg/dL — ABNORMAL LOW

## 2013-09-30 LAB — MAGNESIUM: Magnesium: 1.8 mg/dL

## 2013-10-01 LAB — BASIC METABOLIC PANEL
Anion Gap: 11 (ref 7–16)
BUN: 16 mg/dL (ref 7–18)
Calcium, Total: 8.6 mg/dL (ref 8.5–10.1)
Chloride: 102 mmol/L (ref 98–107)
Co2: 23 mmol/L (ref 21–32)
Creatinine: 0.95 mg/dL (ref 0.60–1.30)
EGFR (African American): >60
EGFR (Non-African Amer.): >60
Glucose: 108 mg/dL — ABNORMAL HIGH (ref 65–99)
Osmolality: 274 (ref 275–301)
Potassium: 3.8 mmol/L (ref 3.5–5.1)
Sodium: 136 mmol/L (ref 136–145)

## 2013-10-01 LAB — CBC CANCER CENTER
Basophil #: 0 x10 3/mm (ref 0.0–0.1)
Basophil %: 1 %
Eosinophil #: 0 x10 3/mm (ref 0.0–0.7)
Eosinophil %: 0.4 %
HCT: 28.1 % — ABNORMAL LOW (ref 35.0–47.0)
HGB: 9.5 g/dL — ABNORMAL LOW (ref 12.0–16.0)
Lymphocyte #: 0.4 x10 3/mm — ABNORMAL LOW (ref 1.0–3.6)
Lymphocyte %: 21.3 %
MCH: 30.1 pg (ref 26.0–34.0)
MCHC: 33.7 g/dL (ref 32.0–36.0)
MCV: 89 fL (ref 80–100)
Monocyte #: 0 x10 3/mm — ABNORMAL LOW (ref 0.2–0.9)
Monocyte %: 1.3 %
Neutrophil #: 1.4 x10 3/mm (ref 1.4–6.5)
Neutrophil %: 76 %
Platelet: 263 x10 3/mm (ref 150–440)
RBC: 3.14 10*6/uL — ABNORMAL LOW (ref 3.80–5.20)
RDW: 12.7 % (ref 11.5–14.5)
WBC: 1.8 x10 3/mm — CL (ref 3.6–11.0)

## 2013-10-03 LAB — CULTURE, BLOOD (SINGLE)

## 2013-10-08 LAB — CBC CANCER CENTER
Basophil #: 0 x10 3/mm (ref 0.0–0.1)
Basophil %: 0.1 %
Eosinophil #: 0 x10 3/mm (ref 0.0–0.7)
Eosinophil %: 1.3 %
HCT: 21.1 % — ABNORMAL LOW (ref 35.0–47.0)
HGB: 7.2 g/dL — ABNORMAL LOW (ref 12.0–16.0)
Lymphocyte #: 0.3 x10 3/mm — ABNORMAL LOW (ref 1.0–3.6)
Lymphocyte %: 34.9 %
MCH: 29.8 pg (ref 26.0–34.0)
MCHC: 34.1 g/dL (ref 32.0–36.0)
MCV: 87 fL (ref 80–100)
Monocyte #: 0.5 x10 3/mm (ref 0.2–0.9)
Monocyte %: 49.8 %
Neutrophil #: 0.1 x10 3/mm — ABNORMAL LOW (ref 1.4–6.5)
Neutrophil %: 13.9 %
Platelet: 12 x10 3/mm — CL (ref 150–440)
RBC: 2.41 10*6/uL — ABNORMAL LOW (ref 3.80–5.20)
RDW: 12.2 % (ref 11.5–14.5)
WBC: 1 x10 3/mm — CL (ref 3.6–11.0)

## 2013-10-08 LAB — HEPATIC FUNCTION PANEL A (ARMC)
Albumin: 2.9 g/dL — ABNORMAL LOW (ref 3.4–5.0)
Alkaline Phosphatase: 101 U/L
Bilirubin, Direct: 0.1 mg/dL (ref 0.00–0.20)
Bilirubin,Total: 0.3 mg/dL (ref 0.2–1.0)
SGOT(AST): 11 U/L — ABNORMAL LOW (ref 15–37)
SGPT (ALT): 23 U/L (ref 12–78)
Total Protein: 6.8 g/dL (ref 6.4–8.2)

## 2013-10-08 LAB — BASIC METABOLIC PANEL
Anion Gap: 8 (ref 7–16)
BUN: 10 mg/dL (ref 7–18)
Calcium, Total: 9.1 mg/dL (ref 8.5–10.1)
Chloride: 95 mmol/L — ABNORMAL LOW (ref 98–107)
Co2: 29 mmol/L (ref 21–32)
Creatinine: 0.86 mg/dL (ref 0.60–1.30)
EGFR (African American): >60
EGFR (Non-African Amer.): >60
Glucose: 95 mg/dL (ref 65–99)
Osmolality: 263 (ref 275–301)
Potassium: 4.2 mmol/L (ref 3.5–5.1)
Sodium: 132 mmol/L — ABNORMAL LOW (ref 136–145)

## 2013-10-08 LAB — PROTIME-INR
INR: 1
Prothrombin Time: 13.2 secs (ref 11.5–14.7)

## 2013-10-08 LAB — MAGNESIUM: Magnesium: 2.1 mg/dL

## 2013-10-10 LAB — CBC CANCER CENTER
Basophil #: 0 x10 3/mm (ref 0.0–0.1)
Basophil %: 0.1 %
Eosinophil #: 0 x10 3/mm (ref 0.0–0.7)
Eosinophil %: 0.1 %
HCT: 21.1 % — ABNORMAL LOW (ref 35.0–47.0)
HGB: 7.1 g/dL — ABNORMAL LOW (ref 12.0–16.0)
Lymphocyte #: 0.4 x10 3/mm — ABNORMAL LOW (ref 1.0–3.6)
Lymphocyte %: 20.3 %
MCH: 30 pg (ref 26.0–34.0)
MCHC: 33.7 g/dL (ref 32.0–36.0)
MCV: 89 fL (ref 80–100)
Monocyte #: 0.7 x10 3/mm (ref 0.2–0.9)
Monocyte %: 34.5 %
Neutrophil #: 0.9 x10 3/mm — ABNORMAL LOW (ref 1.4–6.5)
Neutrophil %: 45 %
Platelet: 30 x10 3/mm — CL (ref 150–440)
RBC: 2.37 10*6/uL — ABNORMAL LOW (ref 3.80–5.20)
RDW: 12.2 % (ref 11.5–14.5)
WBC: 2 x10 3/mm — CL (ref 3.6–11.0)

## 2013-10-15 LAB — BASIC METABOLIC PANEL
Anion Gap: 7 (ref 7–16)
BUN: 11 mg/dL (ref 7–18)
Calcium, Total: 9.2 mg/dL (ref 8.5–10.1)
Chloride: 103 mmol/L (ref 98–107)
Co2: 31 mmol/L (ref 21–32)
Creatinine: 0.87 mg/dL (ref 0.60–1.30)
EGFR (African American): >60
EGFR (Non-African Amer.): >60
Glucose: 101 mg/dL — ABNORMAL HIGH (ref 65–99)
Osmolality: 281 (ref 275–301)
Potassium: 4.4 mmol/L (ref 3.5–5.1)
Sodium: 141 mmol/L (ref 136–145)

## 2013-10-15 LAB — CBC CANCER CENTER
Basophil #: 0 x10 3/mm (ref 0.0–0.1)
Basophil %: 0.2 %
Eosinophil #: 0 x10 3/mm (ref 0.0–0.7)
Eosinophil %: 0 %
HCT: 26.3 % — ABNORMAL LOW (ref 35.0–47.0)
HGB: 8.8 g/dL — ABNORMAL LOW (ref 12.0–16.0)
Lymphocyte #: 0.7 x10 3/mm — ABNORMAL LOW (ref 1.0–3.6)
Lymphocyte %: 25.8 %
MCH: 30 pg (ref 26.0–34.0)
MCHC: 33.3 g/dL (ref 32.0–36.0)
MCV: 90 fL (ref 80–100)
Monocyte #: 0.7 x10 3/mm (ref 0.2–0.9)
Monocyte %: 26.6 %
Neutrophil #: 1.2 x10 3/mm — ABNORMAL LOW (ref 1.4–6.5)
Neutrophil %: 47.4 %
Platelet: 114 x10 3/mm — ABNORMAL LOW (ref 150–440)
RBC: 2.92 10*6/uL — ABNORMAL LOW (ref 3.80–5.20)
RDW: 12.7 % (ref 11.5–14.5)
WBC: 2.6 x10 3/mm — ABNORMAL LOW (ref 3.6–11.0)

## 2013-10-22 LAB — CBC CANCER CENTER
Basophil #: 0 x10 3/mm (ref 0.0–0.1)
Basophil %: 0.5 %
Eosinophil #: 0 x10 3/mm (ref 0.0–0.7)
Eosinophil %: 0.1 %
HCT: 29.4 % — ABNORMAL LOW (ref 35.0–47.0)
HGB: 10 g/dL — ABNORMAL LOW (ref 12.0–16.0)
Lymphocyte #: 1.1 x10 3/mm (ref 1.0–3.6)
Lymphocyte %: 32.2 %
MCH: 31.4 pg (ref 26.0–34.0)
MCHC: 33.9 g/dL (ref 32.0–36.0)
MCV: 93 fL (ref 80–100)
Monocyte #: 0.8 x10 3/mm (ref 0.2–0.9)
Monocyte %: 22.7 %
Neutrophil #: 1.5 x10 3/mm (ref 1.4–6.5)
Neutrophil %: 44.5 %
Platelet: 306 x10 3/mm (ref 150–440)
RBC: 3.18 10*6/uL — ABNORMAL LOW (ref 3.80–5.20)
RDW: 15.3 % — ABNORMAL HIGH (ref 11.5–14.5)
WBC: 3.4 x10 3/mm — ABNORMAL LOW (ref 3.6–11.0)

## 2013-10-22 LAB — BASIC METABOLIC PANEL
Anion Gap: 8 (ref 7–16)
BUN: 17 mg/dL (ref 7–18)
Calcium, Total: 9.1 mg/dL (ref 8.5–10.1)
Chloride: 103 mmol/L (ref 98–107)
Co2: 30 mmol/L (ref 21–32)
Creatinine: 0.82 mg/dL (ref 0.60–1.30)
EGFR (African American): >60
EGFR (Non-African Amer.): >60
Glucose: 93 mg/dL (ref 65–99)
Osmolality: 282 (ref 275–301)
Potassium: 4.1 mmol/L (ref 3.5–5.1)
Sodium: 141 mmol/L (ref 136–145)

## 2013-10-25 LAB — URINALYSIS, COMPLETE
Bacteria: NONE SEEN
Bilirubin,UR: NEGATIVE
Blood: NEGATIVE
Glucose,UR: NEGATIVE mg/dL (ref 0–75)
Ketone: NEGATIVE
Nitrite: NEGATIVE
Ph: 8 (ref 4.5–8.0)
Protein: 100
RBC,UR: 1 /HPF (ref 0–5)
Specific Gravity: 1.016 (ref 1.003–1.030)
Squamous Epithelial: NONE SEEN
WBC UR: 9 /HPF (ref 0–5)

## 2013-10-26 ENCOUNTER — Ambulatory Visit: Payer: Self-pay | Admitting: Internal Medicine

## 2013-10-29 LAB — CBC CANCER CENTER
Basophil #: 0 x10 3/mm (ref 0.0–0.1)
Basophil %: 1 %
Eosinophil #: 0 x10 3/mm (ref 0.0–0.7)
Eosinophil %: 0.9 %
HCT: 24.9 % — ABNORMAL LOW (ref 35.0–47.0)
HGB: 8.7 g/dL — ABNORMAL LOW (ref 12.0–16.0)
Lymphocyte #: 0.2 x10 3/mm — ABNORMAL LOW (ref 1.0–3.6)
Lymphocyte %: 39.7 %
MCH: 32.2 pg (ref 26.0–34.0)
MCHC: 35.1 g/dL (ref 32.0–36.0)
MCV: 92 fL (ref 80–100)
Monocyte #: 0 x10 3/mm — ABNORMAL LOW (ref 0.2–0.9)
Monocyte %: 6.2 %
Neutrophil #: 0.2 x10 3/mm — ABNORMAL LOW (ref 1.4–6.5)
Neutrophil %: 52.2 %
Platelet: 66 x10 3/mm — ABNORMAL LOW (ref 150–440)
RBC: 2.71 10*6/uL — ABNORMAL LOW (ref 3.80–5.20)
RDW: 19.4 % — ABNORMAL HIGH (ref 11.5–14.5)
WBC: 0.4 x10 3/mm — CL (ref 3.6–11.0)

## 2013-10-29 LAB — BASIC METABOLIC PANEL
Anion Gap: 8 (ref 7–16)
BUN: 22 mg/dL — ABNORMAL HIGH (ref 7–18)
Calcium, Total: 8.7 mg/dL (ref 8.5–10.1)
Chloride: 102 mmol/L (ref 98–107)
Co2: 28 mmol/L (ref 21–32)
Creatinine: 0.98 mg/dL (ref 0.60–1.30)
EGFR (African American): >60
EGFR (Non-African Amer.): >60
Glucose: 111 mg/dL — ABNORMAL HIGH (ref 65–99)
Osmolality: 280 (ref 275–301)
Potassium: 3.4 mmol/L — ABNORMAL LOW (ref 3.5–5.1)
Sodium: 138 mmol/L (ref 136–145)

## 2013-11-05 ENCOUNTER — Ambulatory Visit: Payer: Self-pay | Admitting: Internal Medicine

## 2013-11-05 LAB — BASIC METABOLIC PANEL
Anion Gap: 8 (ref 7–16)
BUN: 25 mg/dL — ABNORMAL HIGH (ref 7–18)
Calcium, Total: 8.9 mg/dL (ref 8.5–10.1)
Chloride: 102 mmol/L (ref 98–107)
Co2: 30 mmol/L (ref 21–32)
Creatinine: 0.93 mg/dL (ref 0.60–1.30)
EGFR (African American): >60
EGFR (Non-African Amer.): >60
Glucose: 95 mg/dL (ref 65–99)
Osmolality: 284 (ref 275–301)
Potassium: 3.9 mmol/L (ref 3.5–5.1)
Sodium: 140 mmol/L (ref 136–145)

## 2013-11-05 LAB — HEPATIC FUNCTION PANEL A (ARMC)
Albumin: 3.5 g/dL (ref 3.4–5.0)
Alkaline Phosphatase: 138 U/L — ABNORMAL HIGH
Bilirubin, Direct: <0.05 mg/dL (ref 0.00–0.20)
Bilirubin,Total: 0.1 mg/dL — ABNORMAL LOW (ref 0.2–1.0)
SGOT(AST): 15 U/L (ref 15–37)
SGPT (ALT): 22 U/L (ref 12–78)
Total Protein: 6.9 g/dL (ref 6.4–8.2)

## 2013-11-05 LAB — CBC CANCER CENTER
Basophil #: 0 x10 3/mm (ref 0.0–0.1)
Basophil %: 0.1 %
Eosinophil #: 0 x10 3/mm (ref 0.0–0.7)
Eosinophil %: 0.1 %
HCT: 21.5 % — ABNORMAL LOW (ref 35.0–47.0)
HGB: 7.4 g/dL — ABNORMAL LOW (ref 12.0–16.0)
Lymphocyte #: 0.8 x10 3/mm — ABNORMAL LOW (ref 1.0–3.6)
Lymphocyte %: 12.6 %
MCH: 31.8 pg (ref 26.0–34.0)
MCHC: 34.2 g/dL (ref 32.0–36.0)
MCV: 93 fL (ref 80–100)
Monocyte #: 1.2 x10 3/mm — ABNORMAL HIGH (ref 0.2–0.9)
Monocyte %: 19.6 %
Neutrophil #: 4.2 x10 3/mm (ref 1.4–6.5)
Neutrophil %: 67.6 %
Platelet: 5 x10 3/mm — CL (ref 150–440)
RBC: 2.32 10*6/uL — ABNORMAL LOW (ref 3.80–5.20)
RDW: 19.4 % — ABNORMAL HIGH (ref 11.5–14.5)
WBC: 6.2 x10 3/mm (ref 3.6–11.0)

## 2013-11-05 LAB — MAGNESIUM: Magnesium: 2.3 mg/dL

## 2013-11-06 LAB — CANCER CTR PLATELET CT: Platelet: 40 x10 3/mm — ABNORMAL LOW (ref 150–440)

## 2013-11-08 LAB — CBC CANCER CENTER
Basophil #: 0 x10 3/mm (ref 0.0–0.1)
Basophil %: 0.2 %
Eosinophil #: 0 x10 3/mm (ref 0.0–0.7)
Eosinophil %: 0.2 %
HCT: 18.6 % — ABNORMAL LOW (ref 35.0–47.0)
HGB: 6.4 g/dL — ABNORMAL LOW (ref 12.0–16.0)
Lymphocyte #: 0.5 x10 3/mm — ABNORMAL LOW (ref 1.0–3.6)
Lymphocyte %: 14.4 %
MCH: 31.9 pg (ref 26.0–34.0)
MCHC: 34.4 g/dL (ref 32.0–36.0)
MCV: 93 fL (ref 80–100)
Monocyte #: 0.9 x10 3/mm (ref 0.2–0.9)
Monocyte %: 24.3 %
Neutrophil #: 2.1 x10 3/mm (ref 1.4–6.5)
Neutrophil %: 60.9 %
RBC: 2.01 10*6/uL — ABNORMAL LOW (ref 3.80–5.20)
RDW: 19.2 % — ABNORMAL HIGH (ref 11.5–14.5)
WBC: 3.5 x10 3/mm — ABNORMAL LOW (ref 3.6–11.0)

## 2013-11-08 LAB — PLATELET COUNT: Platelet: 25 10*3/uL — CL (ref 150–440)

## 2013-11-12 LAB — BASIC METABOLIC PANEL
Anion Gap: 7 (ref 7–16)
BUN: 14 mg/dL (ref 7–18)
Calcium, Total: 8.5 mg/dL (ref 8.5–10.1)
Chloride: 105 mmol/L (ref 98–107)
Co2: 30 mmol/L (ref 21–32)
Creatinine: 0.76 mg/dL (ref 0.60–1.30)
EGFR (African American): >60
EGFR (Non-African Amer.): >60
Glucose: 126 mg/dL — ABNORMAL HIGH (ref 65–99)
Osmolality: 285 (ref 275–301)
Potassium: 4 mmol/L (ref 3.5–5.1)
Sodium: 142 mmol/L (ref 136–145)

## 2013-11-12 LAB — CBC CANCER CENTER
Basophil #: 0 x10 3/mm (ref 0.0–0.1)
Basophil %: 0.4 %
Eosinophil #: 0 x10 3/mm (ref 0.0–0.7)
Eosinophil %: 0.1 %
HCT: 21.9 % — ABNORMAL LOW (ref 35.0–47.0)
HGB: 7.6 g/dL — ABNORMAL LOW (ref 12.0–16.0)
Lymphocyte #: 0.5 x10 3/mm — ABNORMAL LOW (ref 1.0–3.6)
Lymphocyte %: 18.1 %
MCH: 32 pg (ref 26.0–34.0)
MCHC: 34.8 g/dL (ref 32.0–36.0)
MCV: 92 fL (ref 80–100)
Monocyte #: 0.8 x10 3/mm (ref 0.2–0.9)
Monocyte %: 27.8 %
Neutrophil #: 1.6 x10 3/mm (ref 1.4–6.5)
Neutrophil %: 53.6 %
Platelet: 64 x10 3/mm — ABNORMAL LOW (ref 150–440)
RBC: 2.38 10*6/uL — ABNORMAL LOW (ref 3.80–5.20)
RDW: 18 % — ABNORMAL HIGH (ref 11.5–14.5)
WBC: 2.9 x10 3/mm — ABNORMAL LOW (ref 3.6–11.0)

## 2013-11-20 LAB — BASIC METABOLIC PANEL
Anion Gap: 7 (ref 7–16)
BUN: 12 mg/dL (ref 7–18)
Calcium, Total: 8.3 mg/dL — ABNORMAL LOW (ref 8.5–10.1)
Chloride: 105 mmol/L (ref 98–107)
Co2: 30 mmol/L (ref 21–32)
Creatinine: 0.81 mg/dL (ref 0.60–1.30)
EGFR (African American): >60
EGFR (Non-African Amer.): >60
Glucose: 94 mg/dL (ref 65–99)
Osmolality: 283 (ref 275–301)
Potassium: 4 mmol/L (ref 3.5–5.1)
Sodium: 142 mmol/L (ref 136–145)

## 2013-11-20 LAB — CBC CANCER CENTER
Basophil #: 0 x10 3/mm (ref 0.0–0.1)
Basophil %: 0.1 %
Eosinophil #: 0 x10 3/mm (ref 0.0–0.7)
Eosinophil %: 1.2 %
HCT: 23.6 % — ABNORMAL LOW (ref 35.0–47.0)
HGB: 8 g/dL — ABNORMAL LOW (ref 12.0–16.0)
Lymphocyte #: 0.6 x10 3/mm — ABNORMAL LOW (ref 1.0–3.6)
Lymphocyte %: 21.3 %
MCH: 33.7 pg (ref 26.0–34.0)
MCHC: 33.7 g/dL (ref 32.0–36.0)
MCV: 100 fL (ref 80–100)
Monocyte #: 0.6 x10 3/mm (ref 0.2–0.9)
Monocyte %: 24.8 %
Neutrophil #: 1.4 x10 3/mm (ref 1.4–6.5)
Neutrophil %: 52.6 %
Platelet: 175 x10 3/mm (ref 150–440)
RBC: 2.37 10*6/uL — ABNORMAL LOW (ref 3.80–5.20)
RDW: 24 % — ABNORMAL HIGH (ref 11.5–14.5)
WBC: 2.6 x10 3/mm — ABNORMAL LOW (ref 3.6–11.0)

## 2013-11-26 ENCOUNTER — Ambulatory Visit: Payer: Self-pay | Admitting: Internal Medicine

## 2013-12-18 LAB — CBC CANCER CENTER
Basophil #: 0 x10 3/mm (ref 0.0–0.1)
Basophil %: 0.4 %
Eosinophil #: 0.1 x10 3/mm (ref 0.0–0.7)
Eosinophil %: 1.8 %
HCT: 30.7 % — ABNORMAL LOW (ref 35.0–47.0)
HGB: 10.3 g/dL — ABNORMAL LOW (ref 12.0–16.0)
Lymphocyte #: 0.7 x10 3/mm — ABNORMAL LOW (ref 1.0–3.6)
Lymphocyte %: 19.9 %
MCH: 35.3 pg — ABNORMAL HIGH (ref 26.0–34.0)
MCHC: 33.7 g/dL (ref 32.0–36.0)
MCV: 105 fL — ABNORMAL HIGH (ref 80–100)
Monocyte #: 0.5 x10 3/mm (ref 0.2–0.9)
Monocyte %: 15 %
Neutrophil #: 2.2 x10 3/mm (ref 1.4–6.5)
Neutrophil %: 62.9 %
Platelet: 156 x10 3/mm (ref 150–440)
RBC: 2.93 10*6/uL — ABNORMAL LOW (ref 3.80–5.20)
RDW: 17.7 % — ABNORMAL HIGH (ref 11.5–14.5)
WBC: 3.4 x10 3/mm — ABNORMAL LOW (ref 3.6–11.0)

## 2013-12-25 LAB — CBC CANCER CENTER
Basophil #: 0 x10 3/mm (ref 0.0–0.1)
Basophil %: 0.2 %
Eosinophil #: 0.1 x10 3/mm (ref 0.0–0.7)
Eosinophil %: 1.6 %
HCT: 33.1 % — ABNORMAL LOW (ref 35.0–47.0)
HGB: 11.2 g/dL — ABNORMAL LOW (ref 12.0–16.0)
Lymphocyte #: 0.8 x10 3/mm — ABNORMAL LOW (ref 1.0–3.6)
Lymphocyte %: 19.9 %
MCH: 35.4 pg — ABNORMAL HIGH (ref 26.0–34.0)
MCHC: 33.8 g/dL (ref 32.0–36.0)
MCV: 105 fL — ABNORMAL HIGH (ref 80–100)
Monocyte #: 0.5 x10 3/mm (ref 0.2–0.9)
Monocyte %: 11.1 %
Neutrophil #: 2.8 x10 3/mm (ref 1.4–6.5)
Neutrophil %: 67.2 %
Platelet: 158 x10 3/mm (ref 150–440)
RBC: 3.16 10*6/uL — ABNORMAL LOW (ref 3.80–5.20)
RDW: 16.1 % — ABNORMAL HIGH (ref 11.5–14.5)
WBC: 4.2 x10 3/mm (ref 3.6–11.0)

## 2013-12-26 ENCOUNTER — Ambulatory Visit: Payer: Self-pay | Admitting: Internal Medicine

## 2013-12-31 LAB — CBC CANCER CENTER
Basophil #: 0 x10 3/mm (ref 0.0–0.1)
Basophil %: 0.3 %
Eosinophil #: 0.1 x10 3/mm (ref 0.0–0.7)
Eosinophil %: 2.6 %
HCT: 32.3 % — ABNORMAL LOW (ref 35.0–47.0)
HGB: 10.9 g/dL — ABNORMAL LOW (ref 12.0–16.0)
Lymphocyte #: 0.6 x10 3/mm — ABNORMAL LOW (ref 1.0–3.6)
Lymphocyte %: 19.7 %
MCH: 35.5 pg — ABNORMAL HIGH (ref 26.0–34.0)
MCHC: 33.8 g/dL (ref 32.0–36.0)
MCV: 105 fL — ABNORMAL HIGH (ref 80–100)
Monocyte #: 0.3 x10 3/mm (ref 0.2–0.9)
Monocyte %: 10.7 %
Neutrophil #: 1.9 x10 3/mm (ref 1.4–6.5)
Neutrophil %: 66.7 %
Platelet: 140 x10 3/mm — ABNORMAL LOW (ref 150–440)
RBC: 3.08 10*6/uL — ABNORMAL LOW (ref 3.80–5.20)
RDW: 15.3 % — ABNORMAL HIGH (ref 11.5–14.5)
WBC: 2.8 x10 3/mm — ABNORMAL LOW (ref 3.6–11.0)

## 2014-01-26 ENCOUNTER — Ambulatory Visit: Payer: Self-pay | Admitting: Internal Medicine

## 2014-02-11 ENCOUNTER — Ambulatory Visit: Payer: Self-pay | Admitting: Internal Medicine

## 2014-02-12 ENCOUNTER — Ambulatory Visit: Payer: Self-pay | Admitting: Oncology

## 2014-02-12 LAB — CBC CANCER CENTER
Basophil #: 0 x10 3/mm (ref 0.0–0.1)
Basophil %: 0.5 %
Eosinophil #: 0 x10 3/mm (ref 0.0–0.7)
Eosinophil %: 1.8 %
HCT: 33.2 % — ABNORMAL LOW (ref 35.0–47.0)
HGB: 11.2 g/dL — ABNORMAL LOW (ref 12.0–16.0)
Lymphocyte #: 0.9 x10 3/mm — ABNORMAL LOW (ref 1.0–3.6)
Lymphocyte %: 34.1 %
MCH: 34.5 pg — ABNORMAL HIGH (ref 26.0–34.0)
MCHC: 33.8 g/dL (ref 32.0–36.0)
MCV: 102 fL — ABNORMAL HIGH (ref 80–100)
Monocyte #: 0.5 x10 3/mm (ref 0.2–0.9)
Monocyte %: 18.2 %
Neutrophil #: 1.2 x10 3/mm — ABNORMAL LOW (ref 1.4–6.5)
Neutrophil %: 45.4 %
Platelet: 157 x10 3/mm (ref 150–440)
RBC: 3.26 10*6/uL — ABNORMAL LOW (ref 3.80–5.20)
RDW: 13.3 % (ref 11.5–14.5)
WBC: 2.6 x10 3/mm — ABNORMAL LOW (ref 3.6–11.0)

## 2014-02-12 LAB — LACTATE DEHYDROGENASE: LDH: 167 U/L (ref 81–246)

## 2014-02-12 LAB — HEPATIC FUNCTION PANEL A (ARMC)
Albumin: 3.3 g/dL — ABNORMAL LOW (ref 3.4–5.0)
Alkaline Phosphatase: 132 U/L — ABNORMAL HIGH
Bilirubin, Direct: <0.1 mg/dL (ref 0.00–0.20)
Bilirubin,Total: 0.2 mg/dL (ref 0.2–1.0)
SGOT(AST): 19 U/L (ref 15–37)
SGPT (ALT): 24 U/L
Total Protein: 6.7 g/dL (ref 6.4–8.2)

## 2014-02-12 LAB — CREATININE, SERUM
Creatinine: 1.14 mg/dL (ref 0.60–1.30)
EGFR (African American): 58 — ABNORMAL LOW
EGFR (Non-African Amer.): 50 — ABNORMAL LOW

## 2014-02-26 ENCOUNTER — Ambulatory Visit: Payer: Self-pay | Admitting: Internal Medicine

## 2014-03-28 ENCOUNTER — Ambulatory Visit: Payer: Self-pay | Admitting: Internal Medicine

## 2014-03-29 ENCOUNTER — Ambulatory Visit: Payer: Self-pay | Admitting: Family Medicine

## 2014-05-07 ENCOUNTER — Ambulatory Visit: Payer: Self-pay | Admitting: Internal Medicine

## 2014-05-07 LAB — CBC CANCER CENTER
Basophil #: 0 x10 3/mm (ref 0.0–0.1)
Basophil %: 0.7 %
Eosinophil #: 0.2 x10 3/mm (ref 0.0–0.7)
Eosinophil %: 7.3 %
HCT: 33.2 % — ABNORMAL LOW (ref 35.0–47.0)
HGB: 11.2 g/dL — ABNORMAL LOW (ref 12.0–16.0)
Lymphocyte #: 0.7 x10 3/mm — ABNORMAL LOW (ref 1.0–3.6)
Lymphocyte %: 27.1 %
MCH: 34 pg (ref 26.0–34.0)
MCHC: 33.8 g/dL (ref 32.0–36.0)
MCV: 100 fL (ref 80–100)
Monocyte #: 0.6 x10 3/mm (ref 0.2–0.9)
Monocyte %: 22.6 %
Neutrophil #: 1 x10 3/mm — ABNORMAL LOW (ref 1.4–6.5)
Neutrophil %: 42.3 %
Platelet: 161 x10 3/mm (ref 150–440)
RBC: 3.3 10*6/uL — ABNORMAL LOW (ref 3.80–5.20)
RDW: 14.8 % — ABNORMAL HIGH (ref 11.5–14.5)
WBC: 2.5 x10 3/mm — ABNORMAL LOW (ref 3.6–11.0)

## 2014-05-07 LAB — HEPATIC FUNCTION PANEL A (ARMC)
Albumin: 3.5 g/dL (ref 3.4–5.0)
Alkaline Phosphatase: 143 U/L — ABNORMAL HIGH
Bilirubin, Direct: <0.1 mg/dL (ref 0.0–0.2)
Bilirubin,Total: 0.3 mg/dL (ref 0.2–1.0)
SGOT(AST): 20 U/L (ref 15–37)
SGPT (ALT): 22 U/L
Total Protein: 6.9 g/dL (ref 6.4–8.2)

## 2014-05-07 LAB — LACTATE DEHYDROGENASE: LDH: 153 U/L (ref 81–246)

## 2014-05-07 LAB — CREATININE, SERUM
Creatinine: 1.06 mg/dL (ref 0.60–1.30)
EGFR (African American): >60
EGFR (Non-African Amer.): 55 — ABNORMAL LOW

## 2014-05-09 DIAGNOSIS — I10 Essential (primary) hypertension: Secondary | ICD-10-CM | POA: Insufficient documentation

## 2014-05-28 ENCOUNTER — Ambulatory Visit: Payer: Self-pay | Admitting: Internal Medicine

## 2014-06-18 LAB — CBC CANCER CENTER
Basophil #: 0 x10 3/mm (ref 0.0–0.1)
Basophil %: 0.8 %
Eosinophil #: 0.1 x10 3/mm (ref 0.0–0.7)
Eosinophil %: 3.5 %
HCT: 33.5 % — ABNORMAL LOW (ref 35.0–47.0)
HGB: 11.3 g/dL — ABNORMAL LOW (ref 12.0–16.0)
Lymphocyte #: 0.8 x10 3/mm — ABNORMAL LOW (ref 1.0–3.6)
Lymphocyte %: 32.5 %
MCH: 33 pg (ref 26.0–34.0)
MCHC: 33.7 g/dL (ref 32.0–36.0)
MCV: 98 fL (ref 80–100)
Monocyte #: 0.4 x10 3/mm (ref 0.2–0.9)
Monocyte %: 16.4 %
Neutrophil #: 1.2 x10 3/mm — ABNORMAL LOW (ref 1.4–6.5)
Neutrophil %: 46.8 %
Platelet: 161 x10 3/mm (ref 150–440)
RBC: 3.41 10*6/uL — ABNORMAL LOW (ref 3.80–5.20)
RDW: 13.8 % (ref 11.5–14.5)
WBC: 2.5 x10 3/mm — ABNORMAL LOW (ref 3.6–11.0)

## 2014-06-28 ENCOUNTER — Ambulatory Visit: Payer: Self-pay | Admitting: Internal Medicine

## 2014-08-07 ENCOUNTER — Ambulatory Visit: Payer: Self-pay | Admitting: Internal Medicine

## 2014-08-14 ENCOUNTER — Ambulatory Visit: Payer: Self-pay | Admitting: Internal Medicine

## 2014-08-27 ENCOUNTER — Ambulatory Visit
Admit: 2014-08-27 | Disposition: A | Discharge status: Home / Self Care | Payer: Self-pay | Attending: Internal Medicine | Admitting: Internal Medicine

## 2014-10-02 ENCOUNTER — Ambulatory Visit
Admit: 2014-10-02 | Disposition: A | Discharge status: Home / Self Care | Payer: Self-pay | Attending: Internal Medicine | Admitting: Internal Medicine

## 2014-10-19 NOTE — H&P (Signed)
PATIENT NAME:  Erin [name], Erin [name] MR#:  [id number] DATE OF BIRTH:  [birth year]  DATE OF ADMISSION:  09/27/2013  PRIMARY CARE PROVIDER: Dr. Torrie Mayers  PRIMARY ONCOLOGIST: Dr. Ma Hillock  CHIEF COMPLAINT: Vomiting and severe weakness.   HISTORY OF PRESENT ILLNESS: A 69 year old Caucasian female patient with history of diffuse, large cell B-cell lymphoma, diagnosed in the right inguinal node and history of DVT, who presents to the Emergency Room after she started having severe vomiting earlier today morning. The patient did have a bowel movement, which was normal yesterday. Is passing gas. She started her second round of chemotherapy yesterday for her lymphoma. She did have similar symptoms during prior chemotherapy, but this is much more severe. She tried her Zofran pills, which did not help. Here in the hospital, the patient received 2 IV doses of different nausea medications, still continues to have dry heaving, vomiting, and hospitalist team has been requested for admission for IV medications and IV fluids for dehydration. The patient is also hypokalemic.   The patient had similar symptoms in the past with chemo, but milder. She does not complain of any diarrhea. No hematemesis. Does not take any NSAIDs.  PAST MEDICAL HISTORY: 1.  DVT.   2.  Diffuse large B-cell lymphoma.  3.  Anxiety.  4.  Asthma.  5.  Hiatal hernia.  6.  Peptic ulcer disease.  7.  Hemorrhoids.  8.  Diverticulosis.  9.  Cholecystectomy.   FAMILY HISTORY:  CVA and diabetes. No malignancy.   SOCIAL HISTORY: The patient does not smoke. No alcohol. No illicit drugs. Is unemployed. Lives alone.   HOME MEDICATIONS: 1.  Zofran 4 mg oral every 4 hours as needed for nausea or vomiting.  2.  Promethazine 25 mg oral every 6 hours as needed for nausea or vomiting.  3.  Allopurinol 100 mg oral once a day.  4.  Oxycodone 5 mg 1 to 2 tablets every 4 hours as needed for pain.  5.  Calcium with vitamin D 1 tablet 2 times a day. 6.   Gabapentin 300 mg oral once a day.  7.  Multivitamin 1 tablet daily.  8.  Omeprazole 20 mg oral 2 times a day.  9.  Ibuprofen 800 mg oral 3 times a day as needed for pain.  10.  Baraclude 0.5 mg oral once a day.  11.  Lorazepam 0.5 mg oral 3 times a day as needed for anxiety and nervousness. 13.  Tizanidine 2 mg, 2 tablets oral every 8 hours as needed.  14.  ProAir HFA 2 puffs inhaled 4 times a day as needed.   REVIEW OF SYSTEMS:   CONSTITUTIONAL: Complains of severe fatigue, weakness.  EYES: No blurred vision, pain or redness.  ENT: No tinnitus, ear pain, hearing loss.  RESPIRATORY: No cough, wheeze, hemoptysis.  CARDIOVASCULAR: No dyspnea, orthopnea, or edema.  GASTROINTESTINAL: Has nausea, vomiting. No diarrhea. Has upper epigastric abdominal pain.  GENITOURINARY: No dysuria, hematuria, frequency. ENDOCRINE: No polyuria, nocturia, thyroid problems.  HEMATOLOGIC AND LYMPHATIC: Has chronic anemia and lymphoma.  INTEGUMENTARY: No acne, rash, or lesion.  MUSCULOSKELETAL: No back pain, arthritis.  NEUROLOGIC: No focal numbness, has generalized weakness.  PSYCHIATRIC: Has anxiety.   ALLERGIES: PENICILLIN, PREDNISONE AND SULFA.   PHYSICAL EXAMINATION:  VITAL SIGNS: Temperature 98.5, pulse 102. Respirations 18, blood pressure 89/51, saturating 100% on room air.  GENERAL: Obese Caucasian female patient lying in bed in significant distress secondary to her vomiting and dry heaving. Feels very weak.  PSYCHIATRIC: Alert  and oriented x 3, anxious.  HEENT: Atraumatic, normocephalic. Oral mucosa dry and pink. No ulcers or thrush. Pallor positive. No icterus. Pupils were equal and reactive to light.  NECK: Supple. No thyromegaly or palpable lymph nodes. Trachea midline. No carotid bruits or JVD.  CARDIOVASCULAR: S1, S2, tachycardic, without any murmurs. Peripheral pulses 2+. No edema.  RESPIRATORY: Normal work of breathing. Clear to auscultation on both sides.  GASTROINTESTINAL: Soft abdomen.  Tenderness in the epigastric area. No rigidity or guarding. Bowel sounds present. No organomegaly palpable.  SKIN: Warm and dry. No petechiae, rash, ulcers.  MUSCULOSKELETAL: No joint swelling, redness, effusion of the large joints. Normal muscle tone.  NEUROLOGICAL: Motor strength 5/5 in upper and lower extremities. Sensation to fine touch  intact all over.  GENITOURINARY: No significant bladder distention.  LYMPHATIC: No cervical lymphadenopathy.   LABORATORY STUDIES: Show glucose of 106, BUN 24, creatinine of 0.83, sodium 137, potassium 3.3, with GFR greater than 60. AST, ALT, alkaline phosphatase, bilirubin normal. Albumin 3. Troponin less than 0.02. WBC 8.5, hemoglobin 9.9, platelets of 553.   Urinalysis shows no bacteria, with 7 WBCs.   EKG shows normal sinus rhythm, nonspecific ST-T wave changes.   Abdominal x-ray shows unremarkable bowel gas pattern. No acute findings.   ASSESSMENT AND PLAN: 1.  Intractable nausea and vomiting with significant dehydration and hypotension in a patient who received chemotherapy yesterday. The patient has not felt better in spite of IV nausea medications. Also continues to be hypotensive, in spite of 2 boluses of normal saline. Will admit the patient as inpatient, continue IV fluid resuscitation, along with IV nausea medication. X-ray shows no obstruction. I suspect her symptoms are secondary to the chemotherapy, along with her weakness. We will have to closely monitor how the patient does over the next 24 hours.   2.  Lymphoma. The patient to followup with Dr. Ma Hillock as outpatient.   3.  Anemia of chronic disease, stable.   4.  Hypokalemia. Replace through IV and p.o.   5.  History of deep vein thrombosis and pulmonary embolus. Place patient on Lovenox.   CODE STATUS IS FULL CODE.   Time spent today on this case with severe dehydration, hypotension, being bolused with IV fluids, is 45 minutes of critical care  time.   ____________________________ Leia Alf Gautam Langhorst, MD srs:mr D: 09/27/2013 19:47:00 ET T: 09/27/2013 20:43:45 ET JOB#: [id number]  cc: Alveta Heimlich R. Jeovanni Heuring, MD, <Dictator> Vinton Sandeep R. Ma Hillock, MD   Neita Carp MD ELECTRONICALLY SIGNED 10/09/2013 14:48

## 2014-10-19 NOTE — Op Note (Signed)
PATIENT NAME:  Erin [name], Erin [name] MR#:  [id number] DATE OF BIRTH:  [birth year]  DATE OF PROCEDURE:  08/17/2013  PREOPERATIVE DIAGNOSIS: Lymphoma.   POSTOPERATIVE DIAGNOSIS: Lymphoma.   PROCEDURE PERFORMED: Insertion of central venous catheter with subcutaneous infusion port, biopsy of right inguinal lymph node.   SURGEON: Rochel Brome, M.D.   ANESTHESIA: General.   INDICATIONS: This 69 year old with a prior history of lymphoma had recent development of a right inguinal adenopathy and has had oncology consultation and advised to have open lymph node biopsy. Also advised to have insertion of the port for chemotherapy.   DESCRIPTION OF PROCEDURE: The patient was placed on the operating table in the supine position under general anesthesia. A rolled sheet was placed behind the shoulder blades so that the neck was extended. The head was turned 20 degrees to the left. The neck was studied with ultrasound, demonstrating location of a normal-appearing internal jugular vein. Also the carotid artery was identified. Next, the anterior neck and right subclavian areas were prepared with ChloraPrep and also prepared the right groin with ChloraPrep. Both areas were draped with sterile towels and sheets.   The port was inserted first and a 3 cm incision was made in the right subclavian area, carried down through subcutaneous tissues. A pouch was created just deep to the deep fascia inferior to the incision large enough to admit the [place] port. Several small bleeding points were cauterized. Hemostasis subsequently intact.   The patient was placed in the Trendelenburg position and the jugular vein was then identified with fluoroscopy and I made a 5 mm incision overlying the jugular vein, and with ultrasound guidance, a needle was advanced into the jugular vein. Dark-appearing blood was aspirated. A guidewire was inserted and passed down into the vena cava. There were some momentary premature ventricular contractions.  The catheter was pulled back several inches. Fluoroscopy was used to demonstrate location of the guidewire in the vena cava.   Next, the dilator and introducer sheath were advanced over the guidewire. The guidewire and dilator were removed. The catheter was advanced down through the sheath and the sheath was peeled away. The catheter was positioned with the tip in the superior vena cava 12 cm from the incision. Next, the catheter was tunneled to the subclavian incision. Pressure was held over the tunnel site.   The patient was placed in the reverse Trendelenburg position. It was noted that an ultrasound image and fluoroscopic image were each saved for the paper chart. Next, the catheter was cut to fit and tacked to the [place] port. The port was accessed with Charisse March needle and aspirated a trace of blood and flushed with heparinized saline solution. The port was placed into the subfascial pouch and sutured to the pectoralis major muscle with 4-0 silk. Next, the pouch was closed with interrupted 5-0 Vicryl. Both skin incisions were closed with 5-0 Vicryl, subcuticular suture and Dermabond.   The patient appeared to be in satisfactory condition.   Next, the lymph node was biopsied by making an incision oriented transversely at the palpable large lymph node in the right groin. Dissection was carried down through a thin layer of subcutaneous tissues. Several small bleeding points were cauterized and deep fascia was incised, and there was the large lymph node apparent with its gray appearance and I used scissors to excise approximately 1 g of tissue and submitted fresh for lymphoma workup.   The wound was inspected. Several small bleeding points were cauterized. Next, the fascia was closed with  interrupted 4-0 Vicryl interrupted inverted sutures and then the skin was closed with 4-0 Monocryl subcuticular suture and Dermabond. The patient tolerated surgery satisfactorily and was then prepared for transfer to the  recovery room.   ____________________________ Lenna Sciara. Rochel Brome, MD jws:np D: 08/17/2013 15:58:51 ET T: 08/17/2013 17:25:09 ET JOB#: [id number]  cc: Loreli Dollar, MD, <Dictator> Loreli Dollar MD ELECTRONICALLY SIGNED 08/22/2013 16:15

## 2014-10-19 NOTE — Consult Note (Signed)
Reason for Visit: This 69 year old Female patient presents to the clinic for initial evaluation of  malignant lymphoma .   Referred by Dr Ma Hillock.  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 Had recurrence status post 3 cycles of rice chemotherapy with residual disease by PET/CT in the right inguinal region. Now for consolidative radiation therapy  Pathology Report pathology reports reviewed   Imaging Report PET CT scan is reviewed   Referral Report clinical notes reviewed   Planned Treatment Regimen consolidative radiation therapy to right inguinal nodes   HPI   the patient is an interesting 69 year old female presented in 2009 with diffuse large cell lymphoma B. 20 positive diagnosed by laparoscopic lymph node biopsy. Bone marrow was negative. PET scan in 2009 showed multiple areas of involvement including chest abdomen pelvis and right inguinal region. Completed 8 cycles of R. CHOP in October 2009. She developed a recurrence biopsy proven in February 2015 with biopsy right inguinal node. At that time PET CT scan showed hypermetabolic left suprarenal lymph nodes as well as right inguinal lymph nodes. She's undergone 3 cycles of rice chemotherapy with PET/CT demonstrating only residual hypermetabolic activity in the right inguinal nodes. She's been [facility] at [facility] and based on social situation has been declined for bone marrow transplant. I've asked to evaluate the patient for possibility of consolidative treatment to her right inguinal nodes. She's otherwise doing well at this time she specifically denies fever chills night sweats weight lossor pain.she does complain of edema in her lower extremities bilaterally.  Past Hx:    Peptic Ulcer Disease:    Hernia, Hiatal:    Hemorrhoids:    Diverticulosis:    DVT - Deep Vein Thrombosis:    Anxiety:     Asthma:    lymphoma:    Hepatitis A 1996:    GERD:    Anemia:    Cholecystectomy:    Laparoscopy:    port a cath removed:    POWER  Port a Cath placement  11/24/07:   Past, Family and Social History:  Past Medical History positive   Respiratory asthma   Gastrointestinal GERD; peptic ulcer disease; hiatal hernia, hemorrhoids   Neurological/Psychiatric anxiety   Past Surgical History cholecystectomy   Past Medical History Comments DVT, malignant lymphoma B cell type as described above   Family History positive   Family History Comments family history positive for CVA, [place] adult onset diabetes.   Social History noncontributory   Additional Past Medical and Surgical History seen by herself today   Allergies:   PCN: Hives  Prednisone: Hives  Sulfa: Rash  Home Meds:  Home Medications: Medication Instructions Status  Aldactone 25 mg oral tablet 1 tab(s) orally once a day Active  oxyCODONE 5 mg oral tablet Take 1  tablet orally every 4 hours as needed for pain. Active  magic mouthwash  5 ml Squish/swallow/spit QID - 480 ml with 1 refill Active  Coumadin 1 mg oral tablet 1 tab(s) orally once a day Active  ondansetron 4 mg oral tablet 1 tablet orally every 4 hours as needed for nausea or vomiting Active  Calcium 600+D 1 tab(s) orally 2 times a day Active  gabapentin capsule 300 mg 1 cap(s) orally once a day (at bedtime) Active  One-A-Day Women oral tablet 1 tab(s) orally once a day Active  omeprazole 20 mg oral delayed release capsule  1 cap(s) orally 2 times a day Active  Baraclude 0.5 mg oral tablet 1 tab(s) orally once a day Active  cetirizine 10 mg oral tablet 1 tab(s) orally once a day Active  ProAir HFA CFC free 90 mcg/inh inhalation aerosol 2 puff(s) inhaled 4 times a day, As Needed Active  LORazepam 0.5 mg oral tablet 1 tab(s) orally 3 times a day, As Needed - for Anxiety, Nervousness Active  tiZANidine 2 mg oral tablet 1 tab(s) orally every 8 hours, As  Needed Active  Vitamin B-12 100 mcg oral tablet 1 tab(s) orally once a day Active   Review of Systems:  General negative   Performance Status (ECOG) 0   Skin negative   Breast negative   Ophthalmologic negative   ENMT negative   Respiratory and Thorax negative   Cardiovascular negative   Gastrointestinal negative   Genitourinary negative   Musculoskeletal negative   Psychiatric negative   Hematology/Lymphatics see HPI   Endocrine negative   Allergic/Immunologic negative   Review of Systems   denies any weight loss, fatigue, weakness, fever, chills or night sweats. Patient denies any loss of vision, blurred vision. Patient denies any ringing  of the ears or hearing loss. No irregular heartbeat. Patient denies heart murmur or history of fainting. Patient denies any chest pain or pain radiating to her upper extremities. Patient denies any shortness of breath, difficulty breathing at night, cough or hemoptysis. Patient denies any swelling in the lower legs. Patient denies any nausea vomiting, vomiting of blood, or coffee ground material in the vomitus. Patient denies any stomach pain. Patient states has had normal bowel movements no significant constipation or diarrhea. Patient denies any dysuria, hematuria or significant nocturia. Patient denies any problems walking, swelling in the joints or loss of balance. Patient denies any skin changes, loss of hair or loss of weight. Patient denies any excessive worrying or anxiety or significant depression. Patient denies any problems with insomnia. Patient denies excessive thirst, polyuria, polydipsia. Patient denies any swollen glands, patient denies easy bruising or easy bleeding. Patient denies any recent infections, allergies or URI. Patient "s visual fields have not changed significantly in recent time.   Physical Exam:  General/Skin/HEENT:  General normal   Skin normal   Eyes normal   ENMT normal   Head and Neck normal    Additional PE well-developed elderly female in NAD. Lungs are clear to A&P cardiac examination shows regular rate and rhythm. Abdomen is benign. She does have prominent inguinal adenopathy on the right side left is clear no other peripheral adenopathy is identified. She does have 2/6 peripheral edema in her lower extremities bilaterally.   Breasts/Resp/CV/GI/GU:  Respiratory and Thorax normal   Cardiovascular normal   Gastrointestinal normal   Genitourinary normal   MS/Neuro/Psych/Lymph:  Musculoskeletal normal   Neurological normal   Lymphatics normal   Other Results:  Radiology Results: LabUnknown:    09-Feb-15 14:23, PET/CT Scan Lymphoma Restaging  PACS Image     11-May-15 14:48, PET/CT Scan Lymphoma Restaging  PACS Image   Nuclear Med:    09-Feb-15 14:23, PET/CT Scan Lymphoma Restaging  PET/CT Scan Lymphoma Restaging   REASON FOR EXAM:    lymphoma restage Rt inguinal adenopathy  COMMENTS:       PROCEDURE: PET - PET/CT RESTAGING LYMPHOMA  - Aug 06 2013  2:23PM     CLINICAL DATA:  Subsequent treatment strategy for lymphoma.  Non-Hodgkin's B-cell lymphoma. Patient status post 8 cycles  chemotherapy.    EXAM:  NUCLEAR MEDICINE PET SKULL BASE TO THIGH    FASTING BLOOD GLUCOSE:  Value: 88 mg/dl    TECHNIQUE:  12.2 mCi F-18 FDG was injected intravenously. CT data was obtained  and used for attenuation correction and anatomic localization only.  (This was not acquired as a diagnostic CT examination.) Additional  exam technical data entered on technologist worksheet.    COMPARISON:  NM PET TUM IMG RESTAG (PS) SKULL BASE T - THIGH dated  05/27/2008    FINDINGS:  NECK    No hypermetabolic neck.    CHEST  No hypermetabolic mediastinal or hilar nodes. No suspicious  pulmonary nodules on the CT scan.    ABDOMEN/PELVIS    There is an intensely hypermetabolic mass centered in the left super  renal location measuring 3.3 x 2.8 cm. This is immediately inferior  to  the adrenal gland and has SUV max 42. No Additional  hypermetabolic abdominal or pelvic lymph nodes. Spleen is normal  volume.    There is an enlarged hypermetabolic right inguinal lymph node  measuring 3.8cm with SUV max 56.    SKELETON  No focal hypermetabolic activity to suggest skeletal metastasis.     IMPRESSION:  1. Lymphoma recurrence with intensely hypermetabolic left suprarenal  lymph node and right inguinal lymph node.  2. Normal spleen.  No evidence of skeletal metastasis.      Electronically Signed    By: Suzy Bouchard M.D.    On: 08/06/2013 15:39         Verified By: Rennis Golden, M.D.,    11-May-15 14:48, PET/CT Scan Lymphoma Restaging  PET/CT Scan Lymphoma Restaging   REASON FOR EXAM:    restaging lymphoma  COMMENTS:       PROCEDURE: PET - PET/CT RESTAGING LYMPHOMA  - Nov 05 2013  2:48PM     CLINICAL DATA:  Subsequent treatment strategy for recurrent  non-Hodgkin's B-cell lymphoma.    EXAM:  NUCLEAR MEDICINE PET SKULL BASE TO THIGH    TECHNIQUE:  13.1 mCi F-18 FDG was injected intravenously. Full-ring PET imaging  was performed from the skull base to thigh after the radiotracer. CT  data was obtained and used for attenuation correction and anatomic  localization.    FASTING BLOOD GLUCOSE:  Value: 81 mg/dl    COMPARISON:  08/06/2013    FINDINGS:  NECK    No hypermetabolic lymph nodes in the neck.    CHEST    No hypermetabolic mediastinal or hilar nodes. No suspicious  pulmonary nodules on the CT scan.  ABDOMEN/PELVIS    No abnormal hypermetabolic activity within the liver, pancreas,  adrenal glands, or spleen.    Previously seen hypermetabolic left abdominal retroperitoneal  lymphadenopathy has resolved since previous exam. Hypermetabolic  lymphadenopathy in the right inguinal region has significantly  decreased in size and activity, with SUV max currently measuring 2.6  compared to 56.8 on prior exam.    SKELETON    No focal  hypermetabolic activity to suggest skeletal metastasis.  Diffusely increased hypermetabolic activity is seen throughout the  bone marrow, consistent with regenerative response to treatment.     IMPRESSION:  Resolution of previously seen hypermetabolic left abdominal  retroperitoneal lymphadenopathy.    Significant decrease in mild hypermetabolic lymphadenopathy in the  right inguinal region.    No evidence of new or progressive lymphoma since prior exam.    Diffuse bone marrow hypermetabolic regenerative response to  treatment noted.    Electronically Signed    By: Earle Gell  M.D.    On: 11/05/2013 17:54         Verified By: Marlaine Hind, M.D.,   Relevent Results:   Relevant Scans and Labs serial PET CTs are reviewed   Assessment and Plan: Impression:   recurrent B-cell lymphoma diffuse type with residual involvement of right inguinal nodes by PET CT scan after 3 cycles of our IRICE chemotherapy. Now for consolidative treatment a right inguinal nodes. Plan:   at this time have recommended based on her previous history and residual hypermetabolic activity in the right inguinal region to treat with consolidative radiation therapy. Will treat to 5000 cGy over 5 weeks using three-dimensional treatment planning. Risks and benefits of treatment including fatigue, skin reaction, possible alteration of blood counts, all were explained in detail to the patient. I have set her up next week for CT simulation. I discussed the case personally with medical oncology.  I would like to take this opportunity for allowing me to participate in the care of your patient..  CC Referral:  cc: Dr. Drue Dun   Electronic Signatures: Baruch Gouty Roda Shutters (MD)  (Signed 26-May-15 11:38)  Authored: HPI, Diagnosis, Past Hx, PFSH, Allergies, Home Meds, ROS, Physical Exam, Other Results, Relevent Results, Encounter Assessment and Plan, CC Referring Physician   Last Updated: 26-May-15 11:38 by Armstead Peaks (MD)

## 2014-10-19 NOTE — Discharge Summary (Signed)
PATIENT NAME:  Erin [name], Erin [name] MR#:  [id number] DATE OF BIRTH:  Nov 17, 1945  DATE OF ADMISSION:  09/27/2013 DATE OF DISCHARGE:  09/30/2013  ADMISSION DIAGNOSES:  1.  Hypotension.  2.  Nausea and vomiting secondary to chemotherapy.   DISCHARGE DIAGNOSES: 1.  Hypotension, likely secondary to dehydration with possibly a chronic component of hypotension.  2.  Intractable nausea and vomiting due to chemotherapy.  4.  History of lymphoma.  5.  Anemia of chronic disease.  6.  Electrolyte abnormalities.   LABORATORY, DIAGNOSTIC AND RADIOLOGICAL DATA AT DISCHARGE:   1.  Sodium 142, potassium 3.7, chloride 114, bicarb 21, BUN 10, creatinine 0.98, glucose 83. Magnesium is 1.8.  2.  Blood cultures are negative to date.   HOSPITAL COURSE: A 69 year old female who is undergoing chemotherapy, who presented with intractable nausea and vomiting and found to have hypotension. For further details, please refer to the H and P.  1.  Intractable nausea and vomiting due to effects of chemotherapy. The patient was placed on supportive care. She actually did well. She is tolerating her regular diet.  2.  Dehydration and hypotension. Blood pressure on the lower side but the patient is asymptomatic. She will need close followup as an outpatient for this. She received over 3 liters of fluids. She does not appear to be dehydrated at this time but her blood pressure is on the low normal side.  There is no indication for sepsis. There was a cortisol level that was checked but the laboratories are still pending. This should be followed up by her oncologist, Dr. Loni Muse.  3.  History of lymphoma. The patient will follow up with Dr. Ma Hillock on Monday.  4.  Anemia of chronic disease which is stable.  5.  Electrolyte abnormalities which were repleted prior to discharge.   DISCHARGE MEDICATIONS: 1.  Calcium plus D 1 tablet b.i.d.  2.  Gabapentin 300 mg at bedtime.  3.  One-A-Day vitamin daily.  4.  Omeprazole 20 mg b.i.d.   5.  Baraclude 0.5 mg daily.  6.  Cetirizine 10 mg daily.  7.  ProAir 2 puffs 4 times a day p.r.n.  8.  Allopurinol 100 mg daily.  9.  Zofran 4  mg q.4 hours p.r.n. nausea and vomiting.  10.  Oxycodone 5 mg 1 to 2 tablets q.4 hours p.r.n. pain.  11.  Tizanidine 2 mg q.8 hours p.r.n.  12.  Vitamin B12 100 mcg daily.   DISCHARGE DIET: Regular diet.   DISCHARGE ACTIVITY: As tolerated.   FOLLOWUP: Patient will follow up with Dr. Ma Hillock on Monday. At that time, cortisol level may be back and may be followed up on.   TIME SPENT: 35 minutes.   The patient is medically stable for discharge.   ____________________________ Erin Schank P. Benjie Karvonen, MD spm:cs D: 09/30/2013 12:34:28 ET T: 09/30/2013 18:41:19 ET JOB#: [id number]  cc: Jaymir Struble P. Benjie Karvonen, MD, <Dictator> Sandeep R. Ma Hillock, MD Donell Beers Hollis Tuller MD ELECTRONICALLY SIGNED 09/30/2013 21:20

## 2014-11-07 DIAGNOSIS — J069 Acute upper respiratory infection, unspecified: Secondary | ICD-10-CM | POA: Insufficient documentation

## 2014-11-12 ENCOUNTER — Other Ambulatory Visit: Payer: Self-pay | Admitting: Internal Medicine

## 2014-11-12 DIAGNOSIS — C8590 Non-Hodgkin lymphoma, unspecified, unspecified site: Secondary | ICD-10-CM

## 2014-12-10 ENCOUNTER — Other Ambulatory Visit: Payer: Self-pay | Admitting: Internal Medicine

## 2014-12-10 NOTE — Telephone Encounter (Signed)
Prescriptions approved and faxed to her pharmacy

## 2014-12-17 ENCOUNTER — Other Ambulatory Visit: Payer: Self-pay

## 2014-12-17 DIAGNOSIS — C8330 Diffuse large B-cell lymphoma, unspecified site: Secondary | ICD-10-CM

## 2014-12-18 ENCOUNTER — Other Ambulatory Visit: Payer: Self-pay

## 2014-12-18 ENCOUNTER — Ambulatory Visit: Payer: Self-pay | Admitting: Internal Medicine

## 2014-12-18 ENCOUNTER — Inpatient Hospital Stay: Payer: Medicare Other

## 2014-12-18 ENCOUNTER — Inpatient Hospital Stay: Payer: Medicare Other | Attending: Internal Medicine

## 2014-12-18 ENCOUNTER — Encounter: Payer: Self-pay | Admitting: Internal Medicine

## 2014-12-18 ENCOUNTER — Inpatient Hospital Stay (HOSPITAL_BASED_OUTPATIENT_CLINIC_OR_DEPARTMENT_OTHER): Payer: Medicare Other | Admitting: Internal Medicine

## 2014-12-18 VITALS — BP 109/74 | HR 82 | Temp 98.4°F | Resp 18 | Ht 63.0 in | Wt 162.1 lb

## 2014-12-18 DIAGNOSIS — Z9221 Personal history of antineoplastic chemotherapy: Secondary | ICD-10-CM | POA: Diagnosis not present

## 2014-12-18 DIAGNOSIS — D649 Anemia, unspecified: Secondary | ICD-10-CM

## 2014-12-18 DIAGNOSIS — Z79899 Other long term (current) drug therapy: Secondary | ICD-10-CM | POA: Diagnosis not present

## 2014-12-18 DIAGNOSIS — M545 Low back pain: Secondary | ICD-10-CM | POA: Insufficient documentation

## 2014-12-18 DIAGNOSIS — Z86718 Personal history of other venous thrombosis and embolism: Secondary | ICD-10-CM | POA: Insufficient documentation

## 2014-12-18 DIAGNOSIS — D72819 Decreased white blood cell count, unspecified: Secondary | ICD-10-CM

## 2014-12-18 DIAGNOSIS — G8929 Other chronic pain: Secondary | ICD-10-CM | POA: Insufficient documentation

## 2014-12-18 DIAGNOSIS — K769 Liver disease, unspecified: Secondary | ICD-10-CM | POA: Insufficient documentation

## 2014-12-18 DIAGNOSIS — C8338 Diffuse large B-cell lymphoma, lymph nodes of multiple sites: Secondary | ICD-10-CM | POA: Insufficient documentation

## 2014-12-18 DIAGNOSIS — C8330 Diffuse large B-cell lymphoma, unspecified site: Secondary | ICD-10-CM

## 2014-12-18 DIAGNOSIS — C8590 Non-Hodgkin lymphoma, unspecified, unspecified site: Secondary | ICD-10-CM

## 2014-12-18 LAB — CBC WITH DIFFERENTIAL/PLATELET
Basophils Absolute: 0 10*3/uL (ref 0–0.1)
Basophils Relative: 0 %
Eosinophils Absolute: 0.1 10*3/uL (ref 0–0.7)
Eosinophils Relative: 2 %
HCT: 35.1 % (ref 35.0–47.0)
Hemoglobin: 11.7 g/dL — ABNORMAL LOW (ref 12.0–16.0)
Lymphocytes Relative: 26 %
Lymphs Abs: 1 10*3/uL (ref 1.0–3.6)
MCH: 32.6 pg (ref 26.0–34.0)
MCHC: 33.4 g/dL (ref 32.0–36.0)
MCV: 97.4 fL (ref 80.0–100.0)
Monocytes Absolute: 0.4 10*3/uL (ref 0.2–0.9)
Monocytes Relative: 10 %
Neutro Abs: 2.5 10*3/uL (ref 1.4–6.5)
Neutrophils Relative %: 62 %
Platelets: 151 10*3/uL (ref 150–440)
RBC: 3.61 MIL/uL — ABNORMAL LOW (ref 3.80–5.20)
RDW: 13.8 % (ref 11.5–14.5)
WBC: 4 10*3/uL (ref 3.6–11.0)

## 2014-12-18 LAB — BASIC METABOLIC PANEL
Anion gap: 4 — ABNORMAL LOW (ref 5–15)
BUN: 16 mg/dL (ref 6–20)
CO2: 29 mmol/L (ref 22–32)
Calcium: 8.4 mg/dL — ABNORMAL LOW (ref 8.9–10.3)
Chloride: 100 mmol/L — ABNORMAL LOW (ref 101–111)
Creatinine, Ser: 1.1 mg/dL — ABNORMAL HIGH (ref 0.44–1.00)
GFR calc Af Amer: 59 mL/min — ABNORMAL LOW (ref >60)
GFR calc non Af Amer: 51 mL/min — ABNORMAL LOW (ref >60)
Glucose, Bld: 123 mg/dL — ABNORMAL HIGH (ref 65–99)
Potassium: 4 mmol/L (ref 3.5–5.1)
Sodium: 133 mmol/L — ABNORMAL LOW (ref 135–145)

## 2014-12-18 LAB — HEPATIC FUNCTION PANEL
ALT: 12 U/L — ABNORMAL LOW (ref 14–54)
AST: 15 U/L (ref 15–41)
Albumin: 3.8 g/dL (ref 3.5–5.0)
Alkaline Phosphatase: 128 U/L — ABNORMAL HIGH (ref 38–126)
Bilirubin, Direct: <0.1 mg/dL — ABNORMAL LOW (ref 0.1–0.5)
Total Bilirubin: 0.9 mg/dL (ref 0.3–1.2)
Total Protein: 6.9 g/dL (ref 6.5–8.1)

## 2014-12-18 LAB — LACTATE DEHYDROGENASE: LDH: 128 U/L (ref 98–192)

## 2014-12-18 MED ORDER — HEPARIN SOD (PORK) LOCK FLUSH 100 UNIT/ML IV SOLN
INTRAVENOUS | Status: AC
  Filled 2014-12-18: qty 5

## 2014-12-18 MED ORDER — HEPARIN SOD (PORK) LOCK FLUSH 100 UNIT/ML IV SOLN
500.0000 [IU] | Freq: Once | INTRAVENOUS | Status: AC
  Administered 2014-12-18: 500 [IU] via INTRAVENOUS

## 2014-12-18 MED ORDER — SODIUM CHLORIDE 0.9 % IJ SOLN
10.0000 mL | INTRAMUSCULAR | Status: AC | PRN
  Administered 2014-12-18: 10 mL via INTRAVENOUS
  Filled 2014-12-18: qty 10

## 2014-12-23 NOTE — Progress Notes (Signed)
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  The EBV ISH and CD15 are negative. Ki-67 highlights >90% proliferative rate. FISH study for MYC did not detect a MYC break apart rearrangement. 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 Flow cytometry is CD19+, CD20+, FMC7+, CD45+, HLADR+,  and CD5-, CD10-). PET scan 08/06/13 - IMPRESSION: 1. Lymphoma recurrence with intensely hypermetabolic left suprarenal lymph node and right inguinal lymph node. 2. Normal spleen.  No evidence of skeletal metastasis. Bone marrow biopsy on 08/29/13 reported:  No diagnostic morphologic or immunophenotypic evidence of lymphoma identified. Flow cytometry unremarkable. Cytogenetics unremarkable, 46XX. Patient completed 3 cycles of R-ICE chemotherapy (09/03/13 - 10/24/13). PET scan on 11/05/13 reported excellent response to treatment with some residual right inguinal area uptake. Was evaluated for Bone marrow transplant at [facility] (Dr.R.Shawnie Dapper) but felt not a candidate due to lack of social support. Completed consolidative radiation to right inguinal region in July 2015.  (Previously Stage III CD20+ diffuse LBCL diagnosed May 2009 by lap LN bx. Bone marrow bx was negative. PET Scan May 2009 - multiple areas of activity in chest abdomen pelvis and right inguinal areas.  Activity also noted in the liver, spleen, mildly in the cardiac region and in the region of the oropharynx. Completed R-CHOP x8 on 04/26/08).  #2  h/o extensive Right upper extremity and neck DVT July 30, 2008 - Port-A-Cath was removed, completed  anticoagulation.  #3 January 26, 2008, DVT of the left upper extremity (venous Doppler showed nonocclusive segmental thrombus in the LEFT axillary vein. No other areas of venous thrombosis are noted in the LEFT upper extremity).  Took anticoagulation till December 2009.    HISTORY OF PRESENT ILLNESS:  Patient returns for continued oncology evaluation for history of lymphoma as described above, she was last seen about 6 months ago. States that she is doing fairly steady. Eating steady, denies weight loss. Chronic low back is unchanged, denies new bone pains. She denies feeling any lymph node masses on self exam. She denies any new dyspnea, sputum, hemoptysis, or chest pain. No orthopnea, PND, or leg swelling either. No fevers or night sweats. No chills. No new mood disturbances. Pain is under control, back pain fluctuates 1-3/10.   REVIEW OF SYSTEMS:   ROS As in HPI above. In addition, no fever, chills or sweats. No new headaches or focal weakness.  No new mood disturbances. No  sore throat, cough, shortness of breath, sputum, hemoptysis or chest pain. No dizziness or palpitation. No abdominal pain, constipation, diarrhea, dysuria or hematuria. No new skin rash or bleeding symptoms. No new paresthesias in extremities. PS ECOG 1.  PAST MEDICAL HISTORY: Reviewed. Past Medical History  Diagnosis Date  . Allergy   . Anxiety           Asthma         Anxiety  Diffuse large B-cell lymphoma, CD20-positive, stage III diagnosed May 2009 by laparoscopic lymph node biopsy  May 2009 - EGD showed hiatal hernia, gastric ulcers with clean base. Colonoscopy showed diverticulosis and sigmoid colon and internal nonbleeding hemorrhoids.  Cholecystectomy September 2010  PAST SURGICAL HISTORY: Reviewed. As above  FAMILY HISTORY: Reviewed. Family History  Problem Relation Age of Onset  . Cancer Daughter   Remarkable for stroke and diabetes, denies malignancy.  ADVANCED DIRECTIVES:  No - patient declined  information  SOCIAL HISTORY: Reviewed. History  Substance Use Topics  . Smoking status: Never Smoker   . Smokeless tobacco: Never Used  . Alcohol Use: No  Denies history of smoking, alcohol, or recreational drug usage. Fairly physically active.  Allergies  Allergen Reactions  . Penicillins Rash  . Prednisone Hives  . Sulfa Antibiotics Hives and Shortness Of Breath    Current Outpatient Prescriptions  Medication Sig Dispense Refill  . albuterol (PROAIR HFA) 108 (90 BASE) MCG/ACT inhaler Inhale into the lungs.    . calcium-vitamin D (CALCIUM 500/D) 500-200 MG-UNIT per tablet Take by mouth.    . cetirizine (ZYRTEC) 10 MG tablet TAKE 1 TABLET BY MOUTH DAILY FOR ALLERGIES  11  . Cyanocobalamin (VITAMIN B-12 CR) 1000 MCG TBCR Take by mouth.    . entecavir (BARACLUDE) 0.5 MG tablet Take by mouth.    . gabapentin (NEURONTIN) 300 MG capsule Take by mouth.    Marland Kitchen ibuprofen (ADVIL,MOTRIN) 800 MG tablet   prn as needed    . ipratropium (ATROVENT HFA) 17 MCG/ACT inhaler Inhale into the lungs.    Marland Kitchen LORazepam (ATIVAN) 0.5 MG tablet Take by mouth.    . mirtazapine (REMERON) 15 MG tablet Take by mouth.    . Multiple Vitamin (MULTI-VITAMINS) TABS Take by mouth.    . ranitidine (ZANTAC) 300 MG capsule Take by mouth.    Marland Kitchen tiZANidine (ZANAFLEX) 2 MG tablet Take by mouth.    . traMADol (ULTRAM) 50 MG tablet TAKE ONE TABLET EVERY 6 HOURS AS NEEDED 60 tablet 0  . traZODone (DESYREL) 50 MG tablet Take by mouth.    . warfarin (COUMADIN) 1 MG tablet Take by mouth.     No current facility-administered medications for this visit.   Facility-Administered Medications Ordered in Other Visits  Medication Dose Route Frequency Provider Last Rate Last Dose  . sodium chloride 0.9 % injection 10 mL  10 mL Intravenous PRN Forest Gleason, MD   10 mL at 12/18/14 1121    PHYSICAL EXAM: Filed Vitals:   12/18/14 1147  BP: 109/74  Pulse: 82  Temp: 98.4 F (36.9 C)  Resp: 18     Body mass index is 28.73 kg/(m^2).     ECOG FS:1 - Symptomatic but completely ambulatory  GENERAL: Patient is alert and oriented and in no acute distress. There is no icterus. HEENT: EOMs intact. Oral exam negative for thrush or lesions. No cervical lymphadenopathy. CVS: S1S2, regular LUNGS: Bilaterally clear to auscultation, no rhonchi. ABDOMEN: Soft, nontender. No hepatosplenomegaly clinically.  NEURO: grossly nonfocal, cranial nerves are intact. Gait unremarkable. EXTREMITIES: No pedal edema. LYMPHATICS: No palpable adenopathy in axillary or inguinal areas.   LAB RESULTS:    Component Value Date/Time   NA 133* 12/18/2014 1108   NA 142 11/20/2013 0842   K 4.0 12/18/2014 1108   K 4.0 11/20/2013 0842   CL 100* 12/18/2014 1108   CL 105 11/20/2013 0842   CO2 29 12/18/2014 1108   CO2 30 11/20/2013 0842   GLUCOSE 123* 12/18/2014 1108   GLUCOSE 94 11/20/2013 0842   BUN 16 12/18/2014 1108   BUN 12 11/20/2013 0842   CREATININE 1.10* 12/18/2014 1108   CREATININE 1.06 05/07/2014 0937   CALCIUM 8.4* 12/18/2014 1108   CALCIUM 8.3* 11/20/2013  0842   PROT 6.9 12/18/2014 1108   PROT 6.9 05/07/2014 0937   ALBUMIN 3.8 12/18/2014 1108   ALBUMIN 3.5 05/07/2014 0937   AST 15 12/18/2014 1108   AST 20 05/07/2014 0937   ALT 12* 12/18/2014 1108   ALT 22 05/07/2014 0937   ALKPHOS 128* 12/18/2014 1108   ALKPHOS 143* 05/07/2014 0937   BILITOT 0.9 12/18/2014 1108   GFRNONAA 51* 12/18/2014 1108   GFRNONAA 50* 02/12/2014 1104   GFRAA 59* 12/18/2014 1108   GFRAA 58* 02/12/2014 1104   Lab Results  Component Value Date   WBC 4.0 12/18/2014   NEUTROABS 2.5 12/18/2014   HGB 11.7* 12/18/2014   HCT 35.1 12/18/2014   MCV 97.4 12/18/2014   PLT 151 12/18/2014     STUDIES: PET scan 11/05/13 - IMPRESSION: Resolution of previously seen hypermetabolic left abdominal retroperitoneal lymphadenopathy. Significant decrease in mild hypermetabolic lymphadenopathy in the right inguinal region. No evidence of new or progressive lymphoma since  prior exam. Diffuse bone marrow hypermetabolic regenerative response to treatment noted.  02/08/14 - CT chest/abdomen/pelvis. IMPRESSION:  1. The left adrenal and right inguinal masses have resolved.  2. No recurrent adenopathy or splenomegaly. No evidence of recurrent lymphoma.  3. Pelvic floor laxity.  4. Resolved sigmoid diverticulitis.  08/07/14 - PET scan. IMPRESSION: No evidence of metabolically active lymphoma.  Mild hypermetabolic atelectasis versus pneumonia in posterior left lower lobe.   STAGING: Lymphoma   Staging form: Lymphoid Neoplasms, AJCC 6th Edition     Clinical stage from 12/23/2014: Stage III - Signed by Leia Alf, MD on 12/23/2014   ASSESSMENT / PLAN:   1.  Recurrent stage III CD20 positive diffuse large B-cell lymphoma. Bone marrow biopsy on 08/29/13 is negative for lymphoma involvement. Has completed 3 cycles of R-ICE chemotherapy (09/03/13 - 10/24/13). PET scan on 11/05/13 reported excellent response to treatment with some residual right inguinal area uptake. Was evaluated for Bone marrow transplant at [facility] (Dr.C.Eugenio Hoes) but felt not a candidate due to lack of social support. Completed consolidative radiation to right inguinal region in July 2015  -  Have again independently reviewed PET scan done on 08/07/14 and d/w patient also, have explained that there is no evidence of recurrent lymphoma. Clinically does not have any lymphadenopathy, hepatosplenomegaly, or B symptoms. Plan is to continue surveillance, have again discussed with patient to try and identify and discuss with the family to see if she can have help available in case she needs to go for bone marrow transplant in the future, patient states that she will work on this. Will get lab on 04/07/15 and schedule CT chest/abdomen/pelvis for lymphoma surveillance, and see her back on 04/08/15. 2. Continue port flush q.6 weeks here.  3.  h/o right upper extremity DVT in 2010 related to Port-A-Cath which was removed, completed  3-4 months anticoagulation. Also has h/o LUE DVT in July 2009 and got anticoagulation till Dec 2009. Hypercoagulable workup was negative. Now on low grade anticoagulation with Coumadin 1 mg daily given that she has Port-A-Cath again.  4.  h/o abnormal liver enzymes, Hepatitis B, following with GI at [facility]. Liver biopsy 1997 reportedly negative for cirrhosis. Per last d/w them, most recent HBsAg study was negative. 5. Mild anemia and leukopenia - asymptomatic, nonspecific. She has had heavy treatment for lymphoma in the recent past. Continue to monitor. 6. In between visits, patient advised to call or come to ER in case of any new symptoms or acute sickness. She is agreeable to this plan.  Leia Alf, MD   12/23/2014 12:19 PM

## 2015-01-06 ENCOUNTER — Other Ambulatory Visit: Payer: Self-pay | Admitting: *Deleted

## 2015-01-06 DIAGNOSIS — C8590 Non-Hodgkin lymphoma, unspecified, unspecified site: Secondary | ICD-10-CM

## 2015-01-06 MED ORDER — LORAZEPAM 0.5 MG PO TABS: 0.5000 mg | ORAL_TABLET | Freq: Three times a day (TID) | ORAL | Status: DC | PRN

## 2015-01-06 MED ORDER — TRAMADOL HCL 50 MG PO TABS: 50.0000 mg | ORAL_TABLET | Freq: Four times a day (QID) | ORAL | Status: DC | PRN

## 2015-02-04 ENCOUNTER — Other Ambulatory Visit: Payer: Self-pay | Admitting: Family Medicine

## 2015-02-04 DIAGNOSIS — M8580 Other specified disorders of bone density and structure, unspecified site: Secondary | ICD-10-CM

## 2015-02-13 ENCOUNTER — Ambulatory Visit: Payer: Medicare Other

## 2015-02-17 ENCOUNTER — Ambulatory Visit
Admission: RE | Admit: 2015-02-17 | Discharge: 2015-02-17 | Disposition: A | Discharge status: Home / Self Care | Payer: Medicare Other | Source: Ambulatory Visit | Attending: Family Medicine | Admitting: Family Medicine

## 2015-02-17 DIAGNOSIS — Z13820 Encounter for screening for osteoporosis: Secondary | ICD-10-CM | POA: Diagnosis not present

## 2015-02-17 DIAGNOSIS — M810 Age-related osteoporosis without current pathological fracture: Secondary | ICD-10-CM | POA: Diagnosis not present

## 2015-02-17 DIAGNOSIS — M8580 Other specified disorders of bone density and structure, unspecified site: Secondary | ICD-10-CM

## 2015-02-26 ENCOUNTER — Other Ambulatory Visit: Payer: Self-pay | Admitting: Internal Medicine

## 2015-02-26 MED ORDER — TRAMADOL HCL 50 MG PO TABS: 50.0000 mg | ORAL_TABLET | Freq: Four times a day (QID) | ORAL | Status: DC | PRN

## 2015-02-26 MED ORDER — LORAZEPAM 0.5 MG PO TABS: ORAL_TABLET | ORAL | Status: DC

## 2015-02-26 NOTE — Addendum Note (Signed)
Addended by: Betti Cruz on: 02/26/2015 03:38 PM   Modules accepted: Orders, Medications

## 2015-03-10 ENCOUNTER — Other Ambulatory Visit: Payer: Self-pay | Admitting: Internal Medicine

## 2015-03-13 ENCOUNTER — Other Ambulatory Visit: Payer: Self-pay | Admitting: Internal Medicine

## 2015-03-14 ENCOUNTER — Other Ambulatory Visit: Payer: Self-pay | Admitting: Internal Medicine

## 2015-03-14 MED ORDER — TRAMADOL HCL 50 MG PO TABS: 50.0000 mg | ORAL_TABLET | Freq: Four times a day (QID) | ORAL | Status: DC | PRN

## 2015-03-14 NOTE — Telephone Encounter (Signed)
Faxed over refill of tramadol and called pt at the number listed and there was no answer.  Will try back later.

## 2015-04-07 ENCOUNTER — Ambulatory Visit
Admission: RE | Admit: 2015-04-07 | Discharge: 2015-04-07 | Disposition: A | Discharge status: Home / Self Care | Payer: Medicare Other | Source: Ambulatory Visit | Attending: Internal Medicine | Admitting: Internal Medicine

## 2015-04-07 ENCOUNTER — Inpatient Hospital Stay: Payer: Medicare Other | Attending: Oncology

## 2015-04-07 DIAGNOSIS — K4090 Unilateral inguinal hernia, without obstruction or gangrene, not specified as recurrent: Secondary | ICD-10-CM | POA: Insufficient documentation

## 2015-04-07 DIAGNOSIS — Z79899 Other long term (current) drug therapy: Secondary | ICD-10-CM | POA: Insufficient documentation

## 2015-04-07 DIAGNOSIS — Z8572 Personal history of non-Hodgkin lymphomas: Secondary | ICD-10-CM | POA: Diagnosis not present

## 2015-04-07 DIAGNOSIS — Z7901 Long term (current) use of anticoagulants: Secondary | ICD-10-CM | POA: Diagnosis not present

## 2015-04-07 DIAGNOSIS — J45909 Unspecified asthma, uncomplicated: Secondary | ICD-10-CM | POA: Insufficient documentation

## 2015-04-07 DIAGNOSIS — C8338 Diffuse large B-cell lymphoma, lymph nodes of multiple sites: Secondary | ICD-10-CM | POA: Diagnosis present

## 2015-04-07 DIAGNOSIS — G8929 Other chronic pain: Secondary | ICD-10-CM | POA: Diagnosis not present

## 2015-04-07 DIAGNOSIS — Z86718 Personal history of other venous thrombosis and embolism: Secondary | ICD-10-CM | POA: Diagnosis not present

## 2015-04-07 DIAGNOSIS — C8590 Non-Hodgkin lymphoma, unspecified, unspecified site: Secondary | ICD-10-CM

## 2015-04-07 DIAGNOSIS — R748 Abnormal levels of other serum enzymes: Secondary | ICD-10-CM | POA: Insufficient documentation

## 2015-04-07 DIAGNOSIS — D696 Thrombocytopenia, unspecified: Secondary | ICD-10-CM | POA: Insufficient documentation

## 2015-04-07 DIAGNOSIS — Z9221 Personal history of antineoplastic chemotherapy: Secondary | ICD-10-CM | POA: Diagnosis not present

## 2015-04-07 DIAGNOSIS — M545 Low back pain: Secondary | ICD-10-CM | POA: Insufficient documentation

## 2015-04-07 DIAGNOSIS — I2510 Atherosclerotic heart disease of native coronary artery without angina pectoris: Secondary | ICD-10-CM | POA: Diagnosis not present

## 2015-04-07 DIAGNOSIS — Z08 Encounter for follow-up examination after completed treatment for malignant neoplasm: Secondary | ICD-10-CM | POA: Diagnosis present

## 2015-04-07 DIAGNOSIS — B1910 Unspecified viral hepatitis B without hepatic coma: Secondary | ICD-10-CM | POA: Diagnosis not present

## 2015-04-07 DIAGNOSIS — F419 Anxiety disorder, unspecified: Secondary | ICD-10-CM | POA: Insufficient documentation

## 2015-04-07 DIAGNOSIS — Z923 Personal history of irradiation: Secondary | ICD-10-CM | POA: Diagnosis not present

## 2015-04-07 HISTORY — DX: Unspecified asthma, uncomplicated: J45.909

## 2015-04-07 HISTORY — DX: Malignant (primary) neoplasm, unspecified: C80.1

## 2015-04-07 LAB — CREATININE, SERUM
Creatinine, Ser: 0.99 mg/dL (ref 0.44–1.00)
GFR calc Af Amer: >60 mL/min (ref >60)
GFR calc non Af Amer: 58 mL/min — ABNORMAL LOW (ref >60)

## 2015-04-07 LAB — CBC WITH DIFFERENTIAL/PLATELET
Basophils Absolute: 0 10*3/uL (ref 0–0.1)
Basophils Relative: 0 %
Eosinophils Absolute: 0.1 10*3/uL (ref 0–0.7)
Eosinophils Relative: 3 %
HCT: 38.4 % (ref 35.0–47.0)
Hemoglobin: 13 g/dL (ref 12.0–16.0)
Lymphocytes Relative: 31 %
Lymphs Abs: 1.2 10*3/uL (ref 1.0–3.6)
MCH: 32.4 pg (ref 26.0–34.0)
MCHC: 33.9 g/dL (ref 32.0–36.0)
MCV: 95.7 fL (ref 80.0–100.0)
Monocytes Absolute: 0.4 10*3/uL (ref 0.2–0.9)
Monocytes Relative: 10 %
Neutro Abs: 2.3 10*3/uL (ref 1.4–6.5)
Neutrophils Relative %: 56 %
Platelets: 141 10*3/uL — ABNORMAL LOW (ref 150–440)
RBC: 4.01 MIL/uL (ref 3.80–5.20)
RDW: 13.5 % (ref 11.5–14.5)
WBC: 4.1 10*3/uL (ref 3.6–11.0)

## 2015-04-07 LAB — LACTATE DEHYDROGENASE: LDH: 125 U/L (ref 98–192)

## 2015-04-07 LAB — HEPATIC FUNCTION PANEL
ALT: 15 U/L (ref 14–54)
AST: 22 U/L (ref 15–41)
Albumin: 4 g/dL (ref 3.5–5.0)
Alkaline Phosphatase: 112 U/L (ref 38–126)
Bilirubin, Direct: <0.1 mg/dL — ABNORMAL LOW (ref 0.1–0.5)
Total Bilirubin: 0.4 mg/dL (ref 0.3–1.2)
Total Protein: 6.6 g/dL (ref 6.5–8.1)

## 2015-04-07 MED ORDER — IOHEXOL 350 MG/ML SOLN
100.0000 mL | Freq: Once | INTRAVENOUS | Status: AC | PRN
  Administered 2015-04-07: 85 mL via INTRAVENOUS

## 2015-04-08 ENCOUNTER — Ambulatory Visit: Payer: Medicare Other | Admitting: Internal Medicine

## 2015-04-09 ENCOUNTER — Inpatient Hospital Stay: Payer: Medicare Other | Admitting: Oncology

## 2015-04-09 ENCOUNTER — Other Ambulatory Visit: Payer: Self-pay | Admitting: Internal Medicine

## 2015-04-10 MED ORDER — TRAMADOL HCL 50 MG PO TABS: 50.0000 mg | ORAL_TABLET | Freq: Four times a day (QID) | ORAL | Status: DC | PRN

## 2015-04-10 NOTE — Addendum Note (Signed)
Addended by: Betti Cruz on: 04/10/2015 08:44 AM   Modules accepted: Orders

## 2015-04-16 ENCOUNTER — Inpatient Hospital Stay (HOSPITAL_BASED_OUTPATIENT_CLINIC_OR_DEPARTMENT_OTHER): Payer: Medicare Other | Admitting: Oncology

## 2015-04-16 VITALS — BP 115/79 | HR 90 | Temp 97.7°F | Resp 16 | Wt 165.3 lb

## 2015-04-16 DIAGNOSIS — Z7901 Long term (current) use of anticoagulants: Secondary | ICD-10-CM

## 2015-04-16 DIAGNOSIS — Z86718 Personal history of other venous thrombosis and embolism: Secondary | ICD-10-CM

## 2015-04-16 DIAGNOSIS — D696 Thrombocytopenia, unspecified: Secondary | ICD-10-CM | POA: Diagnosis not present

## 2015-04-16 DIAGNOSIS — C8338 Diffuse large B-cell lymphoma, lymph nodes of multiple sites: Secondary | ICD-10-CM | POA: Diagnosis not present

## 2015-04-16 DIAGNOSIS — Z9221 Personal history of antineoplastic chemotherapy: Secondary | ICD-10-CM | POA: Diagnosis not present

## 2015-04-16 DIAGNOSIS — Z923 Personal history of irradiation: Secondary | ICD-10-CM

## 2015-04-16 DIAGNOSIS — B1910 Unspecified viral hepatitis B without hepatic coma: Secondary | ICD-10-CM

## 2015-04-16 DIAGNOSIS — G8929 Other chronic pain: Secondary | ICD-10-CM

## 2015-04-16 DIAGNOSIS — C8330 Diffuse large B-cell lymphoma, unspecified site: Secondary | ICD-10-CM

## 2015-04-16 DIAGNOSIS — M545 Low back pain: Secondary | ICD-10-CM

## 2015-04-16 DIAGNOSIS — R748 Abnormal levels of other serum enzymes: Secondary | ICD-10-CM

## 2015-04-16 DIAGNOSIS — Z79899 Other long term (current) drug therapy: Secondary | ICD-10-CM

## 2015-04-16 DIAGNOSIS — F419 Anxiety disorder, unspecified: Secondary | ICD-10-CM

## 2015-04-16 NOTE — Progress Notes (Signed)
Patient reports being diagnosed with osteoporosis by PCP and since getting that diagnosis she has been having all over body aches.

## 2015-04-23 ENCOUNTER — Other Ambulatory Visit: Payer: Self-pay | Admitting: Family Medicine

## 2015-04-23 ENCOUNTER — Other Ambulatory Visit: Payer: Self-pay | Admitting: Internal Medicine

## 2015-04-23 MED ORDER — TRAMADOL HCL 50 MG PO TABS: 50.0000 mg | ORAL_TABLET | Freq: Four times a day (QID) | ORAL | Status: DC | PRN

## 2015-04-30 NOTE — Progress Notes (Signed)
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  The EBV ISH and CD15 are negative. Ki-67 highlights >90% proliferative rate. FISH study for [place] did not detect a MYC break apart rearrangement. 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 Flow cytometry is CD19+, CD20+, FMC7+, CD45+, HLADR+,  and CD5-, CD10-).  Bone marrow biopsy on 08/29/13 reported:  No diagnostic morphologic or immunophenotypic evidence of lymphoma identified. Flow cytometry unremarkable. Cytogenetics unremarkable, 46XX.  Patient completed 3 cycles of R-ICE chemotherapy (09/03/13 - 10/24/13). PET scan on 11/05/13 reported excellent response to treatment with some residual right inguinal area uptake. Was evaluated for Bone marrow transplant at [facility] (Dr.R.Eugenio Hoes) but felt not a candidate due to lack of social support. Completed consolidative radiation to right inguinal region in July 2015.  (Previously Stage III CD20+ diffuse LBCL diagnosed May 2009 by lap LN bx. Bone marrow bx was negative. PET Scan May 2009 - multiple areas of activity in chest abdomen pelvis and right inguinal areas.  Activity also noted in the liver, spleen, mildly in the cardiac region and in the region of the oropharynx. Completed R-CHOP x8 on 04/26/08).      HISTORY OF PRESENT ILLNESS: Patient returns to clinic today for repeat laboratory work, further evaluation, discussion of her imaging results. She currently feels well and is asymptomatic. She does not complain of weakness or fatigue. She has no neurologic complaints. She denies any recent fevers or  illnesses. She denies any night sweats. She has chronic low back pain that is unchanged. She denies any chest pain or shortness of breath. She denies any nausea, vomiting, constipation, or diarrhea. Patient otherwise feels well and offers no further specific complaints.  REVIEW OF SYSTEMS:   Review of Systems  Constitutional: Negative for fever, weight loss and malaise/fatigue.  Respiratory: Negative.   Cardiovascular: Negative.   Gastrointestinal: Negative.   Musculoskeletal: Positive for back pain.  Neurological: Negative.  Negative for weakness.  Endo/Heme/Allergies: Does not bruise/bleed easily.     PAST MEDICAL HISTORY: Reviewed. Past Medical History  Diagnosis Date  . Allergy   . Anxiety   . Asthma   . Cancer [facility])           Asthma         Anxiety  Diffuse large B-cell lymphoma, CD20-positive, stage III diagnosed May 2009 by laparoscopic lymph node biopsy  May 2009 - EGD showed hiatal hernia, gastric ulcers with clean base. Colonoscopy showed diverticulosis and sigmoid colon and internal nonbleeding hemorrhoids.  Cholecystectomy September 2010  PAST SURGICAL HISTORY: Reviewed.   FAMILY HISTORY: Reviewed. Family History  Problem Relation Age of Onset  . Cancer Daughter   Remarkable for stroke and diabetes, denies malignancy.  ADVANCED DIRECTIVES:  No - patient declined information  SOCIAL HISTORY: Reviewed. Social History  Substance Use Topics  . Smoking status: Never Smoker   . Smokeless tobacco: Never Used  . Alcohol Use: No  Denies history of smoking, alcohol, or recreational drug usage. Fairly physically active.  Allergies  Allergen Reactions  . Penicillins Rash  . Prednisone Hives  . Sulfa Antibiotics Hives and Shortness  Of Breath    Current Outpatient Prescriptions  Medication Sig Dispense Refill  . albuterol (PROAIR HFA) 108 (90 BASE) MCG/ACT inhaler Inhale into the lungs.    Marland Kitchen alendronate (FOSAMAX) 70 MG tablet     . calcium-vitamin D (CALCIUM  500/D) 500-200 MG-UNIT per tablet Take by mouth.    . cetirizine (ZYRTEC) 10 MG tablet TAKE 1 TABLET BY MOUTH DAILY FOR ALLERGIES  11  . Cyanocobalamin (VITAMIN B-12 CR) 1000 MCG TBCR Take by mouth.    . entecavir (BARACLUDE) 0.5 MG tablet Take by mouth.    . gabapentin (NEURONTIN) 300 MG capsule Take by mouth.    Marland Kitchen ibuprofen (ADVIL,MOTRIN) 800 MG tablet   prn as needed    . ipratropium (ATROVENT HFA) 17 MCG/ACT inhaler Inhale into the lungs.    Marland Kitchen LORazepam (ATIVAN) 0.5 MG tablet TAKE ONE TABLET 3 TIMES DAILY AS NEEDED FOR ANXIETY 90 tablet 1  . mirtazapine (REMERON) 15 MG tablet Take by mouth.    . Multiple Vitamin (MULTI-VITAMINS) TABS Take by mouth.    . ranitidine (ZANTAC) 300 MG capsule Take by mouth.    Marland Kitchen tiZANidine (ZANAFLEX) 2 MG tablet Take by mouth.    . traZODone (DESYREL) 50 MG tablet Take by mouth.    . warfarin (COUMADIN) 1 MG tablet Take by mouth.    . traMADol (ULTRAM) 50 MG tablet Take 1 tablet (50 mg total) by mouth every 6 (six) hours as needed. 60 tablet 0   No current facility-administered medications for this visit.   Facility-Administered Medications Ordered in Other Visits  Medication Dose Route Frequency Provider Last Rate Last Dose  . sodium chloride 0.9 % injection 10 mL  10 mL Intravenous PRN Forest Gleason, MD   10 mL at 12/18/14 1121    PHYSICAL EXAM: Filed Vitals:   04/16/15 1428  BP: 115/79  Pulse: 90  Temp: 97.7 F (36.5 C)  Resp: 16     Body mass index is 29.3 kg/(m^2).    ECOG FS:1 - Symptomatic but completely ambulatory  General: Well-developed, well-nourished, no acute distress. Eyes: Pink conjunctiva, anicteric sclera. Lungs: Clear to auscultation bilaterally. Heart: Regular rate and rhythm. No rubs, murmurs, or gallops. Abdomen: Soft, nontender, nondistended. No organomegaly noted, normoactive bowel sounds. Musculoskeletal: No edema, cyanosis, or clubbing. Neuro: Alert, answering all questions appropriately. Cranial nerves grossly  intact. Skin: No rashes or petechiae noted. Psych: Normal affect. Lymphatics: No cervical, calvicular, axillary or inguinal LAD.   LAB RESULTS:    Component Value Date/Time   NA 133* 12/18/2014 1108   NA 142 11/20/2013 0842   K 4.0 12/18/2014 1108   K 4.0 11/20/2013 0842   CL 100* 12/18/2014 1108   CL 105 11/20/2013 0842   CO2 29 12/18/2014 1108   CO2 30 11/20/2013 0842   GLUCOSE 123* 12/18/2014 1108   GLUCOSE 94 11/20/2013 0842   BUN 16 12/18/2014 1108   BUN 12 11/20/2013 0842   CREATININE 0.99 04/07/2015 0959   CREATININE 1.06 05/07/2014 0937   CALCIUM 8.4* 12/18/2014 1108   CALCIUM 8.3* 11/20/2013 0842   PROT 6.6 04/07/2015 0959   PROT 6.9 05/07/2014 0937   ALBUMIN 4.0 04/07/2015 0959   ALBUMIN 3.5 05/07/2014 0937   AST 22 04/07/2015 0959   AST 20 05/07/2014 0937   ALT 15 04/07/2015 0959   ALT 22 05/07/2014 0937   ALKPHOS 112 04/07/2015 0959   ALKPHOS 143* 05/07/2014 0937   BILITOT 0.4 04/07/2015 0959   BILITOT 0.3 05/07/2014 4401  GFRNONAA 58* 04/07/2015 0959   GFRNONAA 55* 05/07/2014 0937   GFRNONAA 50* 02/12/2014 1104   GFRAA >60 04/07/2015 0959   GFRAA >60 05/07/2014 0937   GFRAA 58* 02/12/2014 1104   Lab Results  Component Value Date   WBC 4.1 04/07/2015   NEUTROABS 2.3 04/07/2015   HGB 13.0 04/07/2015   HCT 38.4 04/07/2015   MCV 95.7 04/07/2015   PLT 141* 04/07/2015     STUDIES: April 07, 2015: CT of the chest, abdomen, pelvis- 1. No evidence of recurrent lymphoma in the chest, abdomen or Pelvis.   2. Coronary artery calcification.   STAGING: Lymphoma   Staging form: Lymphoid Neoplasms, AJCC 6th Edition     Clinical stage from 12/23/2014: Stage III - Signed by Leia Alf, MD on 12/23/2014   ASSESSMENT / PLAN:   1.  Recurrent stage III CD20 positive diffuse large B-cell lymphoma CT results reviewed independently and reported as above with no evidence of recurrent lymphoma. Patient completed her most recent chemotherapy with Rituxan plus  ICE in April 2015. She then completed consolidative radiation therapy to her right inguinal region in July 2015.  She was felt not to be a bone marrow transplant candidate given poor social support. No intervention is needed at this time. Return to clinic in 6 months with repeat imaging and further evaluation. 2.  h/o right upper extremity DVT in 2010 related to Port-A-Cath which was removed, completed 3-4 months anticoagulation. Also has h/o LUE DVT in July 2009 and got anticoagulation till Dec 2009. Hypercoagulable workup was negative. Now on low grade anticoagulation with Coumadin 1 mg daily given that she has Port-A-Cath again.  3.  h/o abnormal liver enzymes:  Hepatitis B, following with GI at [facility]. Liver biopsy 1997 reportedly negative for cirrhosis. Per last d/w them, most recent HBsAg study was negative. 4. Thrombocytopenia: Mild, monitor.  Patient expressed understanding and was in agreement with this plan.    Lloyd Huger, MD   04/30/2015 1:39 PM

## 2015-05-06 ENCOUNTER — Other Ambulatory Visit: Payer: Self-pay | Admitting: Internal Medicine

## 2015-05-06 NOTE — Telephone Encounter (Signed)
Patient has doubled her use of Tramadol in past month

## 2015-05-06 NOTE — Telephone Encounter (Signed)
Per Dr Thurnell Lose has not had tx in 1 1/2 years and should not be having this much anxiety from her cancer dx. She needs to see PCP regarding her chronic back pain as this is not related to her cancer or she needs to be referred to Pain Clinic for management of her pain.. Patient notified and she stated that she hurt her back and that she has a nerve problem and I reiterated that it has nothing to do with her cancer and that her PCP needs to be managing this for her. She said she will contact her PCP at [facility] for her refills

## 2015-05-21 ENCOUNTER — Inpatient Hospital Stay: Payer: Medicare Other | Admitting: Internal Medicine

## 2015-08-20 ENCOUNTER — Ambulatory Visit: Payer: Medicaid Other | Admitting: Internal Medicine

## 2015-08-20 ENCOUNTER — Inpatient Hospital Stay: Payer: Medicare Other

## 2015-08-20 ENCOUNTER — Inpatient Hospital Stay (HOSPITAL_BASED_OUTPATIENT_CLINIC_OR_DEPARTMENT_OTHER): Payer: Medicare Other | Admitting: Internal Medicine

## 2015-08-20 ENCOUNTER — Inpatient Hospital Stay: Payer: Medicare Other | Attending: Internal Medicine

## 2015-08-20 VITALS — BP 114/79 | HR 88 | Temp 95.5°F | Resp 18 | Ht 63.0 in | Wt 164.2 lb

## 2015-08-20 DIAGNOSIS — Z923 Personal history of irradiation: Secondary | ICD-10-CM

## 2015-08-20 DIAGNOSIS — Z9221 Personal history of antineoplastic chemotherapy: Secondary | ICD-10-CM | POA: Insufficient documentation

## 2015-08-20 DIAGNOSIS — Z79899 Other long term (current) drug therapy: Secondary | ICD-10-CM | POA: Insufficient documentation

## 2015-08-20 DIAGNOSIS — N189 Chronic kidney disease, unspecified: Secondary | ICD-10-CM

## 2015-08-20 DIAGNOSIS — C8330 Diffuse large B-cell lymphoma, unspecified site: Secondary | ICD-10-CM

## 2015-08-20 DIAGNOSIS — Z880 Allergy status to penicillin: Secondary | ICD-10-CM | POA: Diagnosis not present

## 2015-08-20 DIAGNOSIS — Z86718 Personal history of other venous thrombosis and embolism: Secondary | ICD-10-CM | POA: Diagnosis not present

## 2015-08-20 DIAGNOSIS — J45909 Unspecified asthma, uncomplicated: Secondary | ICD-10-CM | POA: Insufficient documentation

## 2015-08-20 DIAGNOSIS — C8338 Diffuse large B-cell lymphoma, lymph nodes of multiple sites: Secondary | ICD-10-CM | POA: Diagnosis present

## 2015-08-20 DIAGNOSIS — Z7901 Long term (current) use of anticoagulants: Secondary | ICD-10-CM | POA: Diagnosis not present

## 2015-08-20 DIAGNOSIS — K5900 Constipation, unspecified: Secondary | ICD-10-CM | POA: Insufficient documentation

## 2015-08-20 DIAGNOSIS — Z95828 Presence of other vascular implants and grafts: Secondary | ICD-10-CM

## 2015-08-20 LAB — CBC WITH DIFFERENTIAL/PLATELET
Basophils Absolute: 0 10*3/uL (ref 0–0.1)
Basophils Relative: 0 %
Eosinophils Absolute: 0.1 10*3/uL (ref 0–0.7)
Eosinophils Relative: 2 %
HCT: 36.3 % (ref 35.0–47.0)
Hemoglobin: 12.4 g/dL (ref 12.0–16.0)
Lymphocytes Relative: 32 %
Lymphs Abs: 1.3 10*3/uL (ref 1.0–3.6)
MCH: 32.8 pg (ref 26.0–34.0)
MCHC: 34 g/dL (ref 32.0–36.0)
MCV: 96.5 fL (ref 80.0–100.0)
Monocytes Absolute: 0.4 10*3/uL (ref 0.2–0.9)
Monocytes Relative: 10 %
Neutro Abs: 2.4 10*3/uL (ref 1.4–6.5)
Neutrophils Relative %: 56 %
Platelets: 150 10*3/uL (ref 150–440)
RBC: 3.76 MIL/uL — ABNORMAL LOW (ref 3.80–5.20)
RDW: 13.4 % (ref 11.5–14.5)
WBC: 4.2 10*3/uL (ref 3.6–11.0)

## 2015-08-20 LAB — COMPREHENSIVE METABOLIC PANEL
ALT: 13 U/L — ABNORMAL LOW (ref 14–54)
AST: 17 U/L (ref 15–41)
Albumin: 4 g/dL (ref 3.5–5.0)
Alkaline Phosphatase: 81 U/L (ref 38–126)
Anion gap: 6 (ref 5–15)
BUN: 21 mg/dL — ABNORMAL HIGH (ref 6–20)
CO2: 28 mmol/L (ref 22–32)
Calcium: 8.8 mg/dL — ABNORMAL LOW (ref 8.9–10.3)
Chloride: 103 mmol/L (ref 101–111)
Creatinine, Ser: 1.15 mg/dL — ABNORMAL HIGH (ref 0.44–1.00)
GFR calc Af Amer: 55 mL/min — ABNORMAL LOW (ref >60)
GFR calc non Af Amer: 48 mL/min — ABNORMAL LOW (ref >60)
Glucose, Bld: 108 mg/dL — ABNORMAL HIGH (ref 65–99)
Potassium: 3.9 mmol/L (ref 3.5–5.1)
Sodium: 137 mmol/L (ref 135–145)
Total Bilirubin: 0.4 mg/dL (ref 0.3–1.2)
Total Protein: 7 g/dL (ref 6.5–8.1)

## 2015-08-20 LAB — LACTATE DEHYDROGENASE: LDH: 121 U/L (ref 98–192)

## 2015-08-20 MED ORDER — SODIUM CHLORIDE 0.9% FLUSH
10.0000 mL | INTRAVENOUS | Status: DC | PRN
  Administered 2015-08-20: 10 mL via INTRAVENOUS
  Filled 2015-08-20: qty 10

## 2015-08-20 MED ORDER — HEPARIN SOD (PORK) LOCK FLUSH 100 UNIT/ML IV SOLN
500.0000 [IU] | Freq: Once | INTRAVENOUS | Status: AC
  Administered 2015-08-20: 500 [IU] via INTRAVENOUS

## 2015-08-20 NOTE — Progress Notes (Signed)
[place] OFFICE PROGRESS NOTE  Patient Care Team: Creola Corn, DO as PCP - General (Family Medicine)   SUMMARY OF ONCOLOGIC HISTORY:  # MAY 2009- DLBCL [Bx- laprscopic] s/p R-CHOP x8 [finished Oct 2009]  # FEB 2015- Recurrent DLBCL [right inguinal LN Bx-; PET- Left supra-renal LN; BMBx-neg] s/p RICE x3 [Dr.Pandit]; excellent Response; not Transplant candidate  [sec to poor social support]; s/p RT to right Inguinal LN; CT OCT 2016- NED.   # hx of DV [id number] /port [on coumadin 1mg /d]  INTERVAL HISTORY: This is my first interaction with the patient since I joined the practice September 2016. I reviewed the patient's prior charts/pertinent labs/imaging in detail; findings are summarized above.   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  Patient denies any new lumps or bumps. Appetite is good. She is not losing any weight. No nausea no vomiting. No chest pain or shortness of breath or cough.  Patient has intermittent constipation not any worse. She takes occasional laxative.   REVIEW OF SYSTEMS:  A complete 10 point review of system is done which is negative except mentioned above/history of present illness.   PAST MEDICAL HISTORY :  Past Medical History  Diagnosis Date  . Allergy   . Anxiety   . Asthma   . Cancer ([place])     PAST SURGICAL HISTORY :  No past surgical history on file.  FAMILY HISTORY :   Family History  Problem Relation Age of Onset  . Cancer Daughter     SOCIAL HISTORY:   Social History  Substance Use Topics  . Smoking status: Never Smoker   . Smokeless tobacco: Never Used  . Alcohol Use: No    ALLERGIES:  is allergic to penicillins; prednisone; and sulfa antibiotics.  MEDICATIONS:  Current Outpatient Prescriptions  Medication Sig Dispense Refill  . albuterol (PROAIR HFA) 108 (90 BASE) MCG/ACT inhaler Inhale into the lungs.    Marland Kitchen  alendronate (FOSAMAX) 70 MG tablet     . calcium-vitamin D (CALCIUM 500/D) 500-200 MG-UNIT per tablet Take by mouth.    . cetirizine (ZYRTEC) 10 MG tablet TAKE 1 TABLET BY MOUTH DAILY FOR ALLERGIES  11  . Cyanocobalamin (VITAMIN B-12 CR) 1000 MCG TBCR Take by mouth.    . entecavir (BARACLUDE) 0.5 MG tablet Take by mouth.    . gabapentin (NEURONTIN) 300 MG capsule Take by mouth.    Marland Kitchen ibuprofen (ADVIL,MOTRIN) 800 MG tablet   prn as needed    . ipratropium (ATROVENT HFA) 17 MCG/ACT inhaler Inhale into the lungs.    . mirtazapine (REMERON) 15 MG tablet Take by mouth.    . Multiple Vitamin (MULTI-VITAMINS) TABS Take by mouth.    . ranitidine (ZANTAC) 300 MG capsule Take by mouth.    Marland Kitchen tiZANidine (ZANAFLEX) 2 MG tablet Take by mouth.    . traZODone (DESYREL) 50 MG tablet Take by mouth.    . warfarin (COUMADIN) 1 MG tablet Take by mouth.     No current facility-administered medications for this visit.   Facility-Administered Medications Ordered in Other Visits  Medication Dose Route Frequency Provider Last Rate Last Dose  . sodium chloride 0.9 % injection 10 mL  10 mL Intravenous PRN Forest Gleason, MD   10 mL at 12/18/14 1121    PHYSICAL EXAMINATION: ECOG PERFORMANCE STATUS: 0 - Asymptomatic  BP 114/79 mmHg  Pulse 88  Temp(Src) 95.5 F (35.3 C) (Tympanic)  Resp  18  Ht 5\' 3"  (1.6 m)  Wt 164 lb 3.9 oz (74.5 kg)  BMI 29.10 kg/m2  Filed Weights   08/20/15 1115  Weight: 164 lb 3.9 oz (74.5 kg)    GENERAL: Well-nourished well-developed; Alert, no distress and comfortable.   Alone; able to sit on exam table with some help.  EYES: no pallor or icterus OROPHARYNX: no thrush or ulceration; dentures.  NECK: supple, no masses felt LYMPH:  no palpable lymphadenopathy in the cervical, axillary or inguinal regions LUNGS: clear to auscultation and  No wheeze or crackles HEART/CVS: regular rate & rhythm and no murmurs; No lower extremity edema ABDOMEN:abdomen soft, non-tender and normal bowel  sounds Musculoskeletal:no cyanosis of digits and no clubbing  PSYCH: alert & oriented x 3 with fluent speech NEURO: no focal motor/sensory deficits SKIN:  no rashes or significant lesions  LABORATORY DATA:  I have reviewed the data as listed    Component Value Date/Time   NA 137 08/20/2015 1049   NA 142 11/20/2013 0842   K 3.9 08/20/2015 1049   K 4.0 11/20/2013 0842   CL 103 08/20/2015 1049   CL 105 11/20/2013 0842   CO2 28 08/20/2015 1049   CO2 30 11/20/2013 0842   GLUCOSE 108* 08/20/2015 1049   GLUCOSE 94 11/20/2013 0842   BUN 21* 08/20/2015 1049   BUN 12 11/20/2013 0842   CREATININE 1.15* 08/20/2015 1049   CREATININE 1.06 05/07/2014 0937   CALCIUM 8.8* 08/20/2015 1049   CALCIUM 8.3* 11/20/2013 0842   PROT 7.0 08/20/2015 1049   PROT 6.9 05/07/2014 0937   ALBUMIN 4.0 08/20/2015 1049   ALBUMIN 3.5 05/07/2014 0937   AST 17 08/20/2015 1049   AST 20 05/07/2014 0937   ALT 13* 08/20/2015 1049   ALT 22 05/07/2014 0937   ALKPHOS 81 08/20/2015 1049   ALKPHOS 143* 05/07/2014 0937   BILITOT 0.4 08/20/2015 1049   BILITOT 0.3 05/07/2014 0937   GFRNONAA 48* 08/20/2015 1049   GFRNONAA 55* 05/07/2014 0937   GFRNONAA 50* 02/12/2014 1104   GFRAA 55* 08/20/2015 1049   GFRAA >60 05/07/2014 0937   GFRAA 58* 02/12/2014 1104    No results found for: SPEP, UPEP  Lab Results  Component Value Date   WBC 4.2 08/20/2015   NEUTROABS 2.4 08/20/2015   HGB 12.4 08/20/2015   HCT 36.3 08/20/2015   MCV 96.5 08/20/2015   PLT 150 08/20/2015      Chemistry      Component Value Date/Time   NA 137 08/20/2015 1049   NA 142 11/20/2013 0842   K 3.9 08/20/2015 1049   K 4.0 11/20/2013 0842   CL 103 08/20/2015 1049   CL 105 11/20/2013 0842   CO2 28 08/20/2015 1049   CO2 30 11/20/2013 0842   BUN 21* 08/20/2015 1049   BUN 12 11/20/2013 0842   CREATININE 1.15* 08/20/2015 1049   CREATININE 1.06 05/07/2014 0937      Component Value Date/Time   CALCIUM 8.8* 08/20/2015 1049   CALCIUM 8.3*  11/20/2013 0842   ALKPHOS 81 08/20/2015 1049   ALKPHOS 143* 05/07/2014 0937   AST 17 08/20/2015 1049   AST 20 05/07/2014 0937   ALT 13* 08/20/2015 1049   ALT 22 05/07/2014 0937   BILITOT 0.4 08/20/2015 1049   BILITOT 0.3 05/07/2014 0937        ASSESSMENT & PLAN:   # DLBCL- recurrent 2015-right inguinal/right suprarenal lymph node status post Rice 3; followed by consolidation radiation to the right inguinal  lymph node. Clinically no evidence of recurrence noted. Patient's most recent CAT scan October 2016- no recurrent disease. For now recommend continued follow-up on a clinical basis.  # question hepatitis B/abnormal liver enzymes- liver enzymes today are within normal limits.  # Port flushes- q 6-8 w  # chronic kidney disease creatinine of 1.15- reviewed with the patient. She is given a copy of her labs.  # patient will follow-up with me in approximately 6 months/labs.   # 15 minutes face-to-face with the patient discussing the above plan of care; more than 50% of time spent on natural history; counseling and coordination.       Cammie Sickle, MD 08/20/2015 11:30 AM

## 2015-09-25 ENCOUNTER — Other Ambulatory Visit: Payer: Self-pay | Admitting: Internal Medicine

## 2015-10-01 ENCOUNTER — Other Ambulatory Visit: Payer: Self-pay | Admitting: *Deleted

## 2015-10-02 ENCOUNTER — Other Ambulatory Visit: Payer: Self-pay | Admitting: Internal Medicine

## 2015-10-02 MED ORDER — WARFARIN SODIUM 1 MG PO TABS: 1.0000 mg | ORAL_TABLET | Freq: Every day | ORAL | Status: DC

## 2015-10-15 ENCOUNTER — Inpatient Hospital Stay: Payer: Medicare Other | Attending: Internal Medicine

## 2015-10-15 ENCOUNTER — Encounter: Payer: Self-pay | Admitting: *Deleted

## 2015-10-15 DIAGNOSIS — Z923 Personal history of irradiation: Secondary | ICD-10-CM | POA: Insufficient documentation

## 2015-10-15 DIAGNOSIS — C8338 Diffuse large B-cell lymphoma, lymph nodes of multiple sites: Secondary | ICD-10-CM | POA: Insufficient documentation

## 2015-10-15 DIAGNOSIS — Z452 Encounter for adjustment and management of vascular access device: Secondary | ICD-10-CM | POA: Diagnosis not present

## 2015-10-15 DIAGNOSIS — Z9221 Personal history of antineoplastic chemotherapy: Secondary | ICD-10-CM | POA: Insufficient documentation

## 2015-10-15 DIAGNOSIS — C801 Malignant (primary) neoplasm, unspecified: Secondary | ICD-10-CM

## 2015-10-15 MED ORDER — SODIUM CHLORIDE 0.9% FLUSH
10.0000 mL | INTRAVENOUS | Status: DC | PRN
  Administered 2015-10-15: 10 mL via INTRAVENOUS
  Filled 2015-10-15: qty 10

## 2015-10-15 MED ORDER — HEPARIN SOD (PORK) LOCK FLUSH 100 UNIT/ML IV SOLN
500.0000 [IU] | Freq: Once | INTRAVENOUS | Status: AC
  Administered 2015-10-15: 500 [IU] via INTRAVENOUS

## 2015-10-15 NOTE — Progress Notes (Signed)
Patient in office today for port flush and was asking to be seen for right knee pain and swelling and pain in her right hip when she lires on it at night. I asked her to go see her PCP regarding her pain and she stated she would go see her PCP.

## 2015-11-28 ENCOUNTER — Other Ambulatory Visit: Payer: Self-pay | Admitting: Internal Medicine

## 2015-12-03 ENCOUNTER — Inpatient Hospital Stay: Payer: Medicare Other | Attending: Internal Medicine

## 2015-12-03 DIAGNOSIS — C8330 Diffuse large B-cell lymphoma, unspecified site: Secondary | ICD-10-CM | POA: Insufficient documentation

## 2015-12-03 DIAGNOSIS — Z452 Encounter for adjustment and management of vascular access device: Secondary | ICD-10-CM | POA: Insufficient documentation

## 2015-12-03 DIAGNOSIS — Z95828 Presence of other vascular implants and grafts: Secondary | ICD-10-CM

## 2015-12-03 MED ORDER — SODIUM CHLORIDE 0.9% FLUSH
10.0000 mL | Freq: Once | INTRAVENOUS | Status: AC
  Administered 2015-12-03: 10 mL via INTRAVENOUS
  Filled 2015-12-03: qty 10

## 2015-12-03 MED ORDER — HEPARIN SOD (PORK) LOCK FLUSH 100 UNIT/ML IV SOLN
INTRAVENOUS | Status: AC
  Filled 2015-12-03: qty 5

## 2015-12-03 MED ORDER — HEPARIN SOD (PORK) LOCK FLUSH 100 UNIT/ML IV SOLN
500.0000 [IU] | Freq: Once | INTRAVENOUS | Status: AC
  Administered 2015-12-03: 500 [IU] via INTRAVENOUS

## 2015-12-29 ENCOUNTER — Other Ambulatory Visit: Payer: Self-pay | Admitting: Internal Medicine

## 2016-02-18 ENCOUNTER — Inpatient Hospital Stay: Payer: Medicare Other | Attending: Internal Medicine

## 2016-02-18 ENCOUNTER — Encounter: Payer: Self-pay | Admitting: Internal Medicine

## 2016-02-18 ENCOUNTER — Inpatient Hospital Stay: Payer: Medicare Other

## 2016-02-18 ENCOUNTER — Inpatient Hospital Stay (HOSPITAL_BASED_OUTPATIENT_CLINIC_OR_DEPARTMENT_OTHER): Payer: Medicare Other | Admitting: Internal Medicine

## 2016-02-18 VITALS — BP 110/71 | HR 85 | Temp 97.2°F | Wt 172.6 lb

## 2016-02-18 DIAGNOSIS — F419 Anxiety disorder, unspecified: Secondary | ICD-10-CM

## 2016-02-18 DIAGNOSIS — B1910 Unspecified viral hepatitis B without hepatic coma: Secondary | ICD-10-CM | POA: Diagnosis not present

## 2016-02-18 DIAGNOSIS — M7989 Other specified soft tissue disorders: Secondary | ICD-10-CM | POA: Diagnosis not present

## 2016-02-18 DIAGNOSIS — I10 Essential (primary) hypertension: Secondary | ICD-10-CM

## 2016-02-18 DIAGNOSIS — Z8572 Personal history of non-Hodgkin lymphomas: Secondary | ICD-10-CM | POA: Diagnosis present

## 2016-02-18 DIAGNOSIS — Z79899 Other long term (current) drug therapy: Secondary | ICD-10-CM

## 2016-02-18 DIAGNOSIS — I129 Hypertensive chronic kidney disease with stage 1 through stage 4 chronic kidney disease, or unspecified chronic kidney disease: Secondary | ICD-10-CM

## 2016-02-18 DIAGNOSIS — Z638 Other specified problems related to primary support group: Secondary | ICD-10-CM

## 2016-02-18 DIAGNOSIS — Z7289 Other problems related to lifestyle: Secondary | ICD-10-CM | POA: Diagnosis not present

## 2016-02-18 DIAGNOSIS — Z7951 Long term (current) use of inhaled steroids: Secondary | ICD-10-CM | POA: Diagnosis not present

## 2016-02-18 DIAGNOSIS — Z7901 Long term (current) use of anticoagulants: Secondary | ICD-10-CM

## 2016-02-18 DIAGNOSIS — J45909 Unspecified asthma, uncomplicated: Secondary | ICD-10-CM | POA: Insufficient documentation

## 2016-02-18 DIAGNOSIS — C8253 Diffuse follicle center lymphoma, intra-abdominal lymph nodes: Secondary | ICD-10-CM

## 2016-02-18 DIAGNOSIS — N189 Chronic kidney disease, unspecified: Secondary | ICD-10-CM | POA: Insufficient documentation

## 2016-02-18 DIAGNOSIS — C8330 Diffuse large B-cell lymphoma, unspecified site: Secondary | ICD-10-CM

## 2016-02-18 DIAGNOSIS — Z95828 Presence of other vascular implants and grafts: Secondary | ICD-10-CM

## 2016-02-18 LAB — COMPREHENSIVE METABOLIC PANEL
ALT: 17 U/L (ref 14–54)
AST: 22 U/L (ref 15–41)
Albumin: 3.7 g/dL (ref 3.5–5.0)
Alkaline Phosphatase: 93 U/L (ref 38–126)
Anion gap: 4 — ABNORMAL LOW (ref 5–15)
BUN: 15 mg/dL (ref 6–20)
CO2: 28 mmol/L (ref 22–32)
Calcium: 8.8 mg/dL — ABNORMAL LOW (ref 8.9–10.3)
Chloride: 103 mmol/L (ref 101–111)
Creatinine, Ser: 1.17 mg/dL — ABNORMAL HIGH (ref 0.44–1.00)
GFR calc Af Amer: 54 mL/min — ABNORMAL LOW (ref >60)
GFR calc non Af Amer: 47 mL/min — ABNORMAL LOW (ref >60)
Glucose, Bld: 110 mg/dL — ABNORMAL HIGH (ref 65–99)
Potassium: 3.8 mmol/L (ref 3.5–5.1)
Sodium: 135 mmol/L (ref 135–145)
Total Bilirubin: 0.4 mg/dL (ref 0.3–1.2)
Total Protein: 6.5 g/dL (ref 6.5–8.1)

## 2016-02-18 LAB — CBC WITH DIFFERENTIAL/PLATELET
Basophils Absolute: 0 10*3/uL (ref 0–0.1)
Basophils Relative: 0 %
Eosinophils Absolute: 0.1 10*3/uL (ref 0–0.7)
Eosinophils Relative: 2 %
HCT: 34 % — ABNORMAL LOW (ref 35.0–47.0)
Hemoglobin: 11.9 g/dL — ABNORMAL LOW (ref 12.0–16.0)
Lymphocytes Relative: 30 %
Lymphs Abs: 1 10*3/uL (ref 1.0–3.6)
MCH: 33.1 pg (ref 26.0–34.0)
MCHC: 34.9 g/dL (ref 32.0–36.0)
MCV: 94.8 fL (ref 80.0–100.0)
Monocytes Absolute: 0.4 10*3/uL (ref 0.2–0.9)
Monocytes Relative: 11 %
Neutro Abs: 1.9 10*3/uL (ref 1.4–6.5)
Neutrophils Relative %: 57 %
Platelets: 157 10*3/uL (ref 150–440)
RBC: 3.59 MIL/uL — ABNORMAL LOW (ref 3.80–5.20)
RDW: 13.6 % (ref 11.5–14.5)
WBC: 3.4 10*3/uL — ABNORMAL LOW (ref 3.6–11.0)

## 2016-02-18 LAB — LACTATE DEHYDROGENASE: LDH: 128 U/L (ref 98–192)

## 2016-02-18 MED ORDER — SODIUM CHLORIDE 0.9% FLUSH
10.0000 mL | INTRAVENOUS | Status: DC | PRN
  Administered 2016-02-18: 10 mL via INTRAVENOUS
  Filled 2016-02-18: qty 10

## 2016-02-18 MED ORDER — HEPARIN SOD (PORK) LOCK FLUSH 100 UNIT/ML IV SOLN
500.0000 [IU] | Freq: Once | INTRAVENOUS | Status: AC
  Administered 2016-02-18: 500 [IU] via INTRAVENOUS

## 2016-02-18 NOTE — Assessment & Plan Note (Signed)
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 Clinically no evidence of recurrence noted.  Last CT October 2016- no recurrent disease. For now recommend continued follow-up on a clinical basis.  # question hepatitis B/abnormal liver enzymes- liver enzymes today are within normal limits.  # Port flushes- q 6-8 w  # chronic kidney disease creatinine of 1.15- reviewed with the patient.  # swelling in legs/ recommend elevation/ compression stockings. No diuretics.   # patient will follow-up with me in approximately 6 months/labs.

## 2016-02-18 NOTE — Progress Notes (Signed)
[place] OFFICE PROGRESS NOTE  Patient Care Team: Creola Corn, DO as PCP - General (Family Medicine)   SUMMARY OF ONCOLOGIC HISTORY: Oncology History   # MAY 2009- DLBCL [Bx- laprscopic] s/p R-CHOP x8 [finished Oct 2009]  # FEB 2015- Recurrent DLBCL [right inguinal LN Bx-; PET- Left supra-renal LN; BMBx-neg] s/p RICE x3 [Dr.Pandit]; excellent Response; not Transplant candidate  [sec to poor social support]; s/p RT to right Inguinal LN; CT OCT 2016- NED.   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  Patient states that she has been under significant social stresses at home. She is gaining weight.  Patient denies any new lumps or bumps. Appetite is good.  No nausea no vomiting. No chest pain or shortness of breath or cough.   REVIEW OF SYSTEMS:  A complete 10 point review of system is done which is negative except mentioned above/history of present illness.   PAST MEDICAL HISTORY :  Past Medical History:  Diagnosis Date  . Acute URI   . Allergy   . Anxiety   . Asthma   . Cancer ([place])   . Hepatitis B   . Hypertension     PAST SURGICAL HISTORY :  History reviewed. No pertinent surgical history.  FAMILY HISTORY :   Family History  Problem Relation Age of Onset  . Cancer Daughter     SOCIAL HISTORY:   Social History  Substance Use Topics  . Smoking status: Never Smoker  . Smokeless tobacco: Never Used  . Alcohol use No    ALLERGIES:  is allergic to penicillins; prednisone; and sulfa antibiotics.  MEDICATIONS:  Current Outpatient Prescriptions  Medication Sig Dispense Refill  . albuterol (PROAIR HFA) 108 (90 BASE) MCG/ACT inhaler Inhale into the lungs.    Marland Kitchen alendronate (FOSAMAX) 70 MG tablet     . allopurinol  (ZYLOPRIM) 300 MG tablet Take by mouth.    . calcium-vitamin D (CALCIUM 500/D) 500-200 MG-UNIT per tablet Take by mouth.    . cetirizine (ZYRTEC) 10 MG tablet TAKE 1 TABLET BY MOUTH DAILY FOR ALLERGIES  11  . Cyanocobalamin (VITAMIN B-12 CR) 1000 MCG TBCR Take by mouth.    . entecavir (BARACLUDE) 0.5 MG tablet Take by mouth.    . gabapentin (NEURONTIN) 300 MG capsule Take by mouth.    Marland Kitchen ibuprofen (ADVIL,MOTRIN) 800 MG tablet   prn as needed    . ipratropium (ATROVENT HFA) 17 MCG/ACT inhaler Inhale into the lungs.    Marland Kitchen LORazepam (ATIVAN) 0.5 MG tablet Take by mouth.    . mirtazapine (REMERON) 15 MG tablet Take by mouth.    . Multiple Vitamin (MULTI-VITAMINS) TABS Take by mouth.    Marland Kitchen PARoxetine (PAXIL) 10 MG tablet     . ranitidine (ZANTAC) 300 MG capsule Take by mouth.    Marland Kitchen tiZANidine (ZANAFLEX) 2 MG tablet Take by mouth.    . traMADol (ULTRAM) 50 MG tablet Take by mouth.    . traZODone (DESYREL) 50 MG tablet Take by mouth.    . warfarin (COUMADIN) 1 MG tablet TAKE ONE (1) TABLET BY MOUTH EVERY DAY AT 6PM 30 tablet 2  . zolpidem (AMBIEN) 5 MG tablet Take 1 tab q hs     No current facility-administered medications for  this visit.    Facility-Administered Medications Ordered in Other Visits  Medication Dose Route Frequency Provider Last Rate Last Dose  . sodium chloride 0.9 % injection 10 mL  10 mL Intravenous PRN Forest Gleason, MD   10 mL at 12/18/14 1121  . sodium chloride flush (NS) 0.9 % injection 10 mL  10 mL Intravenous PRN Cammie Sickle, MD   10 mL at 02/18/16 1012    PHYSICAL EXAMINATION: ECOG PERFORMANCE STATUS: 0 - Asymptomatic  BP 110/71   Pulse 85   Temp 97.2 F (36.2 C) (Tympanic)   Wt 172 lb 9.9 oz (78.3 kg)   SpO2 98%   BMI 30.58 kg/m   Filed Weights   02/18/16 1058  Weight: 172 lb 9.9 oz (78.3 kg)    GENERAL: Well-nourished well-developed; Alert, no distress and comfortable.   Alone; able to sit on exam table with some help.  EYES: no pallor or  icterus OROPHARYNX: no thrush or ulceration; dentures.  NECK: supple, no masses felt LYMPH:  no palpable lymphadenopathy in the cervical, axillary or inguinal regions LUNGS: clear to auscultation and  No wheeze or crackles HEART/CVS: regular rate & rhythm and no murmurs; No lower extremity edema ABDOMEN:abdomen soft, non-tender and normal bowel sounds Musculoskeletal:no cyanosis of digits and no clubbing  PSYCH: alert & oriented x 3 with fluent speech NEURO: no focal motor/sensory deficits SKIN:  no rashes or significant lesions  LABORATORY DATA:  I have reviewed the data as listed    Component Value Date/Time   NA 135 02/18/2016 1004   NA 142 11/20/2013 0842   K 3.8 02/18/2016 1004   K 4.0 11/20/2013 0842   CL 103 02/18/2016 1004   CL 105 11/20/2013 0842   CO2 28 02/18/2016 1004   CO2 30 11/20/2013 0842   GLUCOSE 110 (H) 02/18/2016 1004   GLUCOSE 94 11/20/2013 0842   BUN 15 02/18/2016 1004   BUN 12 11/20/2013 0842   CREATININE 1.17 (H) 02/18/2016 1004   CREATININE 1.06 05/07/2014 0937   CALCIUM 8.8 (L) 02/18/2016 1004   CALCIUM 8.3 (L) 11/20/2013 0842   PROT 6.5 02/18/2016 1004   PROT 6.9 05/07/2014 0937   ALBUMIN 3.7 02/18/2016 1004   ALBUMIN 3.5 05/07/2014 0937   AST 22 02/18/2016 1004   AST 20 05/07/2014 0937   ALT 17 02/18/2016 1004   ALT 22 05/07/2014 0937   ALKPHOS 93 02/18/2016 1004   ALKPHOS 143 (H) 05/07/2014 0937   BILITOT 0.4 02/18/2016 1004   BILITOT 0.3 05/07/2014 0937   GFRNONAA 47 (L) 02/18/2016 1004   GFRNONAA 55 (L) 05/07/2014 0937   GFRNONAA 50 (L) 02/12/2014 1104   GFRAA 54 (L) 02/18/2016 1004   GFRAA >60 05/07/2014 0937   GFRAA 58 (L) 02/12/2014 1104    No results found for: SPEP, UPEP  Lab Results  Component Value Date   WBC 3.4 (L) 02/18/2016   NEUTROABS 1.9 02/18/2016   HGB 11.9 (L) 02/18/2016   HCT 34.0 (L) 02/18/2016   MCV 94.8 02/18/2016   PLT 157 02/18/2016      Chemistry      Component Value Date/Time   NA 135 02/18/2016  1004   NA 142 11/20/2013 0842   K 3.8 02/18/2016 1004   K 4.0 11/20/2013 0842   CL 103 02/18/2016 1004   CL 105 11/20/2013 0842   CO2 28 02/18/2016 1004   CO2 30 11/20/2013 0842   BUN 15 02/18/2016 1004   BUN 12 11/20/2013 0842  CREATININE 1.17 (H) 02/18/2016 1004   CREATININE 1.06 05/07/2014 0937      Component Value Date/Time   CALCIUM 8.8 (L) 02/18/2016 1004   CALCIUM 8.3 (L) 11/20/2013 0842   ALKPHOS 93 02/18/2016 1004   ALKPHOS 143 (H) 05/07/2014 0937   AST 22 02/18/2016 1004   AST 20 05/07/2014 0937   ALT 17 02/18/2016 1004   ALT 22 05/07/2014 0937   BILITOT 0.4 02/18/2016 1004   BILITOT 0.3 05/07/2014 0937        ASSESSMENT & PLAN:   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 Clinically no evidence of recurrence noted.  Last CT October 2016- no recurrent disease. For now recommend continued follow-up on a clinical basis.  # question hepatitis B/abnormal liver enzymes- liver enzymes today are within normal limits.  # Port flushes- q 6-8 w  # chronic kidney disease creatinine of 1.15- reviewed with the patient.  # swelling in legs/ recommend elevation/ compression stockings. No diuretics.   # patient will follow-up with me in approximately 6 months/labs.       Cammie Sickle, MD 02/18/2016 5:38 PM

## 2016-03-29 ENCOUNTER — Other Ambulatory Visit: Payer: Self-pay | Admitting: Internal Medicine

## 2016-03-31 ENCOUNTER — Inpatient Hospital Stay: Payer: Medicare Other | Attending: Internal Medicine

## 2016-03-31 DIAGNOSIS — Z452 Encounter for adjustment and management of vascular access device: Secondary | ICD-10-CM | POA: Diagnosis not present

## 2016-03-31 DIAGNOSIS — C9000 Multiple myeloma not having achieved remission: Secondary | ICD-10-CM | POA: Insufficient documentation

## 2016-03-31 DIAGNOSIS — Z95828 Presence of other vascular implants and grafts: Secondary | ICD-10-CM

## 2016-03-31 MED ORDER — HEPARIN SOD (PORK) LOCK FLUSH 100 UNIT/ML IV SOLN
500.0000 [IU] | Freq: Once | INTRAVENOUS | Status: AC
  Administered 2016-03-31: 500 [IU] via INTRAVENOUS

## 2016-03-31 MED ORDER — SODIUM CHLORIDE 0.9% FLUSH
10.0000 mL | INTRAVENOUS | Status: DC | PRN
  Administered 2016-03-31: 10 mL via INTRAVENOUS
  Filled 2016-03-31: qty 10

## 2016-04-01 ENCOUNTER — Other Ambulatory Visit: Payer: Self-pay | Admitting: Family Medicine

## 2016-04-01 DIAGNOSIS — Z1231 Encounter for screening mammogram for malignant neoplasm of breast: Secondary | ICD-10-CM

## 2016-04-27 ENCOUNTER — Ambulatory Visit
Admission: RE | Admit: 2016-04-27 | Discharge: 2016-04-27 | Disposition: A | Discharge status: Home / Self Care | Payer: Medicare Other | Source: Ambulatory Visit | Attending: Family Medicine | Admitting: Family Medicine

## 2016-04-27 ENCOUNTER — Encounter: Payer: Self-pay | Admitting: Radiology

## 2016-04-27 DIAGNOSIS — Z1231 Encounter for screening mammogram for malignant neoplasm of breast: Secondary | ICD-10-CM | POA: Insufficient documentation

## 2016-05-12 ENCOUNTER — Inpatient Hospital Stay: Payer: Medicare Other | Attending: Internal Medicine

## 2016-05-27 ENCOUNTER — Emergency Department
Admission: EM | Admit: 2016-05-27 | Discharge: 2016-05-27 | Disposition: A | Discharge status: Home / Self Care | Payer: Medicare Other | Attending: Emergency Medicine | Admitting: Emergency Medicine

## 2016-05-27 ENCOUNTER — Emergency Department: Payer: Medicare Other

## 2016-05-27 DIAGNOSIS — J45901 Unspecified asthma with (acute) exacerbation: Secondary | ICD-10-CM | POA: Insufficient documentation

## 2016-05-27 DIAGNOSIS — Z79899 Other long term (current) drug therapy: Secondary | ICD-10-CM | POA: Insufficient documentation

## 2016-05-27 DIAGNOSIS — J069 Acute upper respiratory infection, unspecified: Secondary | ICD-10-CM | POA: Insufficient documentation

## 2016-05-27 DIAGNOSIS — R0602 Shortness of breath: Secondary | ICD-10-CM | POA: Diagnosis present

## 2016-05-27 DIAGNOSIS — I10 Essential (primary) hypertension: Secondary | ICD-10-CM | POA: Diagnosis not present

## 2016-05-27 DIAGNOSIS — Z8572 Personal history of non-Hodgkin lymphomas: Secondary | ICD-10-CM | POA: Diagnosis not present

## 2016-05-27 LAB — BASIC METABOLIC PANEL
Anion gap: 7 (ref 5–15)
BUN: 14 mg/dL (ref 6–20)
CO2: 25 mmol/L (ref 22–32)
Calcium: 8.7 mg/dL — ABNORMAL LOW (ref 8.9–10.3)
Chloride: 106 mmol/L (ref 101–111)
Creatinine, Ser: 1.21 mg/dL — ABNORMAL HIGH (ref 0.44–1.00)
GFR calc Af Amer: 52 mL/min — ABNORMAL LOW (ref >60)
GFR calc non Af Amer: 45 mL/min — ABNORMAL LOW (ref >60)
Glucose, Bld: 114 mg/dL — ABNORMAL HIGH (ref 65–99)
Potassium: 3.5 mmol/L (ref 3.5–5.1)
Sodium: 138 mmol/L (ref 135–145)

## 2016-05-27 LAB — CBC
HCT: 37.1 % (ref 35.0–47.0)
Hemoglobin: 12.7 g/dL (ref 12.0–16.0)
MCH: 32.7 pg (ref 26.0–34.0)
MCHC: 34.1 g/dL (ref 32.0–36.0)
MCV: 95.7 fL (ref 80.0–100.0)
Platelets: 137 10*3/uL — ABNORMAL LOW (ref 150–440)
RBC: 3.88 MIL/uL (ref 3.80–5.20)
RDW: 13.8 % (ref 11.5–14.5)
WBC: 4.1 10*3/uL (ref 3.6–11.0)

## 2016-05-27 LAB — TROPONIN I: Troponin I: <0.03 ng/mL (ref <0.03)

## 2016-05-27 MED ORDER — IPRATROPIUM-ALBUTEROL 0.5-2.5 (3) MG/3ML IN SOLN
RESPIRATORY_TRACT | Status: AC
  Administered 2016-05-27: 3 mL via RESPIRATORY_TRACT
  Filled 2016-05-27: qty 6

## 2016-05-27 MED ORDER — DEXAMETHASONE SODIUM PHOSPHATE 4 MG/ML IJ SOLN
10.0000 mg | Freq: Once | INTRAMUSCULAR | Status: DC
  Filled 2016-05-27: qty 3
  Filled 2016-05-27: qty 2.5

## 2016-05-27 MED ORDER — IPRATROPIUM-ALBUTEROL 0.5-2.5 (3) MG/3ML IN SOLN
3.0000 mL | Freq: Once | RESPIRATORY_TRACT | Status: AC
  Administered 2016-05-27: 3 mL via RESPIRATORY_TRACT

## 2016-05-27 MED ORDER — LEVOFLOXACIN 500 MG PO TABS: 500.0000 mg | ORAL_TABLET | Freq: Every day | ORAL | 0 refills | Status: AC

## 2016-05-27 MED ORDER — IPRATROPIUM-ALBUTEROL 0.5-2.5 (3) MG/3ML IN SOLN
RESPIRATORY_TRACT | Status: AC
  Administered 2016-05-27: 3 mL via RESPIRATORY_TRACT
  Filled 2016-05-27: qty 3

## 2016-05-27 MED ORDER — IPRATROPIUM-ALBUTEROL 0.5-2.5 (3) MG/3ML IN SOLN
3.0000 mL | Freq: Four times a day (QID) | RESPIRATORY_TRACT | 1 refills | Status: DC | PRN
Start: 2016-05-27 — End: 2017-10-08

## 2016-05-27 MED ORDER — DEXAMETHASONE SODIUM PHOSPHATE 4 MG/ML IJ SOLN
10.0000 mg | Freq: Once | INTRAMUSCULAR | Status: AC
  Administered 2016-05-27: 10 mg via INTRAMUSCULAR

## 2016-05-27 MED ORDER — DEXAMETHASONE 1.5 MG (21) PO TBPK: 1.5000 mg | ORAL_TABLET | Freq: Every morning | ORAL | 0 refills | Status: DC

## 2016-05-27 NOTE — ED Triage Notes (Signed)
Pt in with co chest pain and shob since yest, has been using svn at home but states not improved. No obvious distress noted in triage.

## 2016-05-27 NOTE — ED Provider Notes (Signed)
[facility] Emergency Department Provider Note        Time seen: ----------------------------------------- 9:54 PM on 05/27/2016 -----------------------------------------    I have reviewed the triage vital signs and the nursing notes.   HISTORY  Chief Complaint Chest Pain    HPI Erin [name] is a 70 y.o. female who presents to ER for chest pain and shortness of breath since yesterday. Patient's been using inhalers at home but states it not improving.Son states she's had 3 breathing treatments today without significant improvement in her symptoms. She has not been on steroids recently, has not had fevers or chills.   Past Medical History:  Diagnosis Date  . Acute URI   . Allergy   . Anxiety   . Asthma   . Cancer ([place])    lymphoma  . Hepatitis B   . Hypertension     Patient Active Problem List   Diagnosis Date Noted  . 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 Disease of liver 12/18/2014  . Acute URI 11/07/2014  . BP (high blood pressure) 05/09/2014  . Chronic hepatitis B virus infection ([place]) 03/27/2011  . Lymphoma ([place]) 10/27/2007    No past surgical history on file.  Allergies Penicillins; Prednisone; and Sulfa antibiotics  Social History Social History  Substance Use Topics  . Smoking status: Never Smoker  . Smokeless tobacco: Never Used  . Alcohol use No    Review of Systems Constitutional: Negative for fever. Cardiovascular: Positive for chest tightness Respiratory: Positive for shortness of breath Gastrointestinal: Negative for abdominal pain, vomiting and diarrhea. Genitourinary: Negative for dysuria. Musculoskeletal: Negative for back pain. Skin: Negative for rash. Neurological: Negative for headaches, focal weakness or numbness.  10-point ROS otherwise negative.  ____________________________________________   PHYSICAL EXAM:  VITAL SIGNS: ED Triage Vitals  Enc  Vitals Group     BP 05/27/16 2037 132/80     Pulse Rate 05/27/16 2037 (!) 112     Resp 05/27/16 2037 20     Temp 05/27/16 2037 98.3 F (36.8 C)     Temp Source 05/27/16 2037 Oral     SpO2 05/27/16 2037 96 %     Weight 05/27/16 2038 173 lb (78.5 kg)     Height 05/27/16 2038 5\' 3"  (1.6 m)     Head Circumference --      Peak Flow --      Pain Score 05/27/16 2038 8     Pain Loc --      Pain Edu? --      Excl. in [place]? --     Constitutional: Alert and oriented. Well appearing and in no distress. Eyes: Conjunctivae are normal. PERRL. Normal extraocular movements. ENT   Head: Normocephalic and atraumatic.   Nose: No congestion/rhinnorhea.   Mouth/Throat: Mucous membranes are moist.   Neck: No stridor. Cardiovascular: Normal rate, regular rhythm. No murmurs, rubs, or gallops. Respiratory: Tachypnea, wheezing with diminished breath sounds bilaterally Gastrointestinal: Soft and nontender. Normal bowel sounds Musculoskeletal: Nontender with normal range of motion in all extremities. No lower extremity tenderness nor edema. Neurologic:  Normal speech and language. No gross focal neurologic deficits are appreciated.  Skin:  Skin is warm, dry and intact. No rash noted. Psychiatric: Mood and affect are normal. Speech and behavior are normal.  ____________________________________________  EKG: Interpreted by me. Sinus tachycardia with rate of 115 bpm, normal PR interval, normal QRS, normal QT, low voltage  ____________________________________________  ED COURSE:  Pertinent labs &  imaging results that were available during my care of the patient were reviewed by me and considered in my medical decision making (see chart for details). Clinical Course   Patient presents to ER with a likely asthma exacerbation. She'll be given DuoNeb since steroids.  Procedures ____________________________________________   LABS (pertinent positives/negatives)  Labs Reviewed  CBC - Abnormal;  Notable for the following:       Result Value   Platelets 137 (*)    All other components within normal limits  BASIC METABOLIC PANEL - Abnormal; Notable for the following:    Glucose, Bld 114 (*)    Creatinine, Ser 1.21 (*)    Calcium 8.7 (*)    GFR calc non Af Amer 45 (*)    GFR calc Af Amer 52 (*)    All other components within normal limits  TROPONIN I    RADIOLOGY Images were viewed by me  IMPRESSION: No active cardiopulmonary disease.  ____________________________________________  FINAL ASSESSMENT AND PLAN  Asthma exacerbation  Plan: Patient with labs and imaging as dictated above. Patient's no distress, much clearer after duo nebs and steroids here. She'll be discharged with a pack and DuoNeb solution for nebulizer. She is stable for outpatient follow-up.   Erin Newport, MD   Note: This dictation was prepared with Dragon dictation. Any transcriptional errors that result from this process are unintentional    Erin Newport, MD 05/27/16 2249

## 2016-05-27 NOTE — ED Notes (Signed)

## 2016-07-14 ENCOUNTER — Encounter: Payer: Self-pay | Admitting: Emergency Medicine

## 2016-07-14 ENCOUNTER — Emergency Department
Admission: EM | Admit: 2016-07-14 | Discharge: 2016-07-14 | Disposition: A | Discharge status: Home / Self Care | Payer: Medicare Other | Attending: Emergency Medicine | Admitting: Emergency Medicine

## 2016-07-14 ENCOUNTER — Emergency Department: Payer: Medicare Other

## 2016-07-14 DIAGNOSIS — Z79899 Other long term (current) drug therapy: Secondary | ICD-10-CM | POA: Diagnosis not present

## 2016-07-14 DIAGNOSIS — Z7901 Long term (current) use of anticoagulants: Secondary | ICD-10-CM | POA: Diagnosis not present

## 2016-07-14 DIAGNOSIS — Z8572 Personal history of non-Hodgkin lymphomas: Secondary | ICD-10-CM | POA: Insufficient documentation

## 2016-07-14 DIAGNOSIS — J40 Bronchitis, not specified as acute or chronic: Secondary | ICD-10-CM

## 2016-07-14 DIAGNOSIS — R0981 Nasal congestion: Secondary | ICD-10-CM | POA: Diagnosis present

## 2016-07-14 DIAGNOSIS — I10 Essential (primary) hypertension: Secondary | ICD-10-CM | POA: Diagnosis not present

## 2016-07-14 DIAGNOSIS — Z791 Long term (current) use of non-steroidal anti-inflammatories (NSAID): Secondary | ICD-10-CM | POA: Diagnosis not present

## 2016-07-14 DIAGNOSIS — J9801 Acute bronchospasm: Secondary | ICD-10-CM | POA: Insufficient documentation

## 2016-07-14 MED ORDER — ALBUTEROL SULFATE HFA 108 (90 BASE) MCG/ACT IN AERS: 1.0000 | INHALATION_SPRAY | Freq: Four times a day (QID) | RESPIRATORY_TRACT | 0 refills | Status: DC | PRN

## 2016-07-14 MED ORDER — METHYLPREDNISOLONE SODIUM SUCC 125 MG IJ SOLR
80.0000 mg | Freq: Once | INTRAMUSCULAR | Status: AC
  Administered 2016-07-14: 80 mg via INTRAMUSCULAR
  Filled 2016-07-14: qty 2

## 2016-07-14 MED ORDER — IPRATROPIUM-ALBUTEROL 0.5-2.5 (3) MG/3ML IN SOLN
3.0000 mL | Freq: Once | RESPIRATORY_TRACT | Status: AC
  Administered 2016-07-14: 3 mL via RESPIRATORY_TRACT
  Filled 2016-07-14: qty 3

## 2016-07-14 MED ORDER — ACETAMINOPHEN 325 MG PO TABS
650.0000 mg | ORAL_TABLET | Freq: Once | ORAL | Status: AC
  Administered 2016-07-14: 650 mg via ORAL

## 2016-07-14 MED ORDER — ALBUTEROL SULFATE HFA 108 (90 BASE) MCG/ACT IN AERS
1.0000 | INHALATION_SPRAY | Freq: Four times a day (QID) | RESPIRATORY_TRACT | Status: DC | PRN
  Filled 2016-07-14: qty 6.7

## 2016-07-14 MED ORDER — ALBUTEROL SULFATE (2.5 MG/3ML) 0.083% IN NEBU
3.0000 mL | INHALATION_SOLUTION | Freq: Four times a day (QID) | RESPIRATORY_TRACT | Status: DC | PRN
  Filled 2016-07-14: qty 3

## 2016-07-14 MED ORDER — AZITHROMYCIN 250 MG PO TABS: ORAL_TABLET | ORAL | 0 refills | Status: DC

## 2016-07-14 MED ORDER — AZITHROMYCIN 500 MG PO TABS
500.0000 mg | ORAL_TABLET | Freq: Once | ORAL | Status: AC
  Administered 2016-07-14: 500 mg via ORAL
  Filled 2016-07-14: qty 1

## 2016-07-14 MED ORDER — ACETAMINOPHEN 325 MG PO TABS
ORAL_TABLET | ORAL | Status: AC
  Administered 2016-07-14: 650 mg via ORAL
  Filled 2016-07-14: qty 2

## 2016-07-14 MED ORDER — PROMETHAZINE-DM 6.25-15 MG/5ML PO SYRP: 5.0000 mL | ORAL_SOLUTION | Freq: Four times a day (QID) | ORAL | 0 refills | Status: DC | PRN

## 2016-07-14 MED ORDER — LORATADINE 10 MG PO TABS: 10.0000 mg | ORAL_TABLET | Freq: Every day | ORAL | 0 refills | Status: DC

## 2016-07-14 NOTE — ED Provider Notes (Signed)
[facility] Emergency Department Provider Note  ____________________________________________  Time seen: Approximately 12:37 PM  I have reviewed the triage vital signs and the nursing notes.   HISTORY  Chief Complaint Nasal Congestion and Cough    HPI Erin [name] is a 71 y.o. female , NAD, presents to the emergency department accompanied by a family member who assists with history. Patient states she has been ill with cough, congestion, nasal drainage and postnasal drip for approximately 3 weeks. Has a history of asthma but states she ran out of her inhalers approximately one week ago. Denies any chest pain, abdominal pain, nausea, vomiting. No changes in urinary or bowel habits. Denies any numbness, weakness or tingling. Has had mild frontal headaches due to sinus pressure but no ear pain or drainage. Denies visual changes, dizziness or lightheadedness.   Past Medical History:  Diagnosis Date  . Acute URI   . Allergy   . Anxiety   . Asthma   . Cancer ([place])    lymphoma  . Hepatitis B   . Hypertension     Patient Active Problem List   Diagnosis Date Noted  . 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 Disease of liver 12/18/2014  . Acute URI 11/07/2014  . BP (high blood pressure) 05/09/2014  . Chronic hepatitis B virus infection ([place]) 03/27/2011  . Lymphoma ([place]) 10/27/2007    History reviewed. No pertinent surgical history.  Prior to Admission medications   Medication Sig Start Date End Date Taking? Authorizing Provider  albuterol (PROVENTIL HFA;VENTOLIN HFA) 108 (90 Base) MCG/ACT inhaler Inhale 1-2 puffs into the lungs every 6 (six) hours as needed for wheezing or shortness of breath. 07/14/16   Jami L Hagler, PA-C  alendronate (FOSAMAX) 70 MG tablet  03/26/15   Historical Provider, MD  allopurinol (ZYLOPRIM) 300 MG tablet Take by mouth.    Historical Provider, MD  azithromycin (ZITHROMAX Z-PAK) 250 MG  tablet Take 2 tablets (500 mg) on  Day 1,  followed by 1 tablet (250 mg) once daily on Days 2 through 5. 07/14/16   Jami L Hagler, PA-C  calcium-vitamin D (CALCIUM 500/D) 500-200 MG-UNIT per tablet Take by mouth.    Historical Provider, MD  cetirizine (ZYRTEC) 10 MG tablet TAKE 1 TABLET BY MOUTH DAILY FOR ALLERGIES 12/04/14   Historical Provider, MD  Cyanocobalamin (VITAMIN B-12 CR) 1000 MCG TBCR Take by mouth.    Historical Provider, MD  Dexamethasone (DEXPAK 6 DAY) 1.5 MG (21) TBPK Take 1.5 mg by mouth every morning. Dispense DEXPak as directed 05/27/16   Earleen Newport, MD  entecavir (BARACLUDE) 0.5 MG tablet Take by mouth. 08/06/14   Historical Provider, MD  gabapentin (NEURONTIN) 300 MG capsule Take by mouth. 08/25/09   Historical Provider, MD  ibuprofen (ADVIL,MOTRIN) 800 MG tablet   prn as needed 04/17/10   Historical Provider, MD  ipratropium (ATROVENT HFA) 17 MCG/ACT inhaler Inhale into the lungs.    Historical Provider, MD  ipratropium-albuterol (DUONEB) 0.5-2.5 (3) MG/3ML SOLN Take 3 mLs by nebulization every 6 (six) hours as needed. 05/27/16   Earleen Newport, MD  loratadine (CLARITIN) 10 MG tablet Take 1 tablet (10 mg total) by mouth daily. 07/14/16   Jami L Hagler, PA-C  LORazepam (ATIVAN) 0.5 MG tablet Take by mouth.    Historical Provider, MD  mirtazapine (REMERON) 15 MG tablet Take by mouth.    Historical Provider, MD  Multiple Vitamin (MULTI-VITAMINS) TABS Take by mouth.  Historical Provider, MD  PARoxetine (PAXIL) 10 MG tablet  08/07/15   Historical Provider, MD  promethazine-dextromethorphan (PROMETHAZINE-DM) 6.25-15 MG/5ML syrup Take 5 mLs by mouth 4 (four) times daily as needed for cough. 07/14/16   Jami L Hagler, PA-C  ranitidine (ZANTAC) 300 MG capsule Take by mouth.    Historical Provider, MD  tiZANidine (ZANAFLEX) 2 MG tablet Take by mouth. 07/07/13   Historical Provider, MD  traMADol (ULTRAM) 50 MG tablet Take by mouth. 04/23/15   Historical Provider, MD  traZODone  (DESYREL) 50 MG tablet Take by mouth. 09/28/10   Historical Provider, MD  warfarin (COUMADIN) 1 MG tablet TAKE ONE (1) TABLET BY MOUTH EVERY DAY AT 6PM 03/29/16   Lloyd Huger, MD  zolpidem (AMBIEN) 5 MG tablet Take 1 tab q hs 06/05/15   Historical Provider, MD    Allergies Penicillins; Prednisone; and Sulfa antibiotics  Family History  Problem Relation Age of Onset  . Cancer Daughter   . Breast cancer Neg Hx     Social History Social History  Substance Use Topics  . Smoking status: Never Smoker  . Smokeless tobacco: Never Used  . Alcohol use No     Review of Systems  Constitutional: No fever/chills Eyes: No visual changes.  ENT: Positive for sinus pressure, postnasal drainage, runny nose. No sore throat, Nasal congestion, ear pain, ear drainage. Cardiovascular: No chest pain. Respiratory: Positive cough, chest congestion, shortness of breath and wheezing.  Gastrointestinal: No abdominal pain.  No nausea, vomiting.  No diarrhea.  No constipation. Genitourinary: Negative for dysuria. No hematuria. No urinary hesitancy, urgency or increased frequency. Musculoskeletal: Negative for general myalgias or back pain.  Skin: Negative for rash. Neurological: Positive for sinus headaches, but no focal weakness or numbness. 10-point ROS otherwise negative.  ____________________________________________   PHYSICAL EXAM:  VITAL SIGNS: ED Triage Vitals  Enc Vitals Group     BP 07/14/16 1230 130/77     Pulse Rate 07/14/16 1230 (!) 116     Resp 07/14/16 1230 20     Temp 07/14/16 1230 98.8 F (37.1 C)     Temp Source 07/14/16 1230 Oral     SpO2 07/14/16 1230 96 %     Weight 07/14/16 1227 155 lb (70.3 kg)     Height 07/14/16 1227 5\' 4"  (1.626 m)     Head Circumference --      Peak Flow --      Pain Score 07/14/16 1230 6     Pain Loc --      Pain Edu? --      Excl. in [place]? --      Constitutional: Alert and oriented. Well appearing and in no acute distress. Eyes:  Conjunctivae are normal. Head: Atraumatic. ENT:      Ears: TMs visualized bilaterally without erythema, effusion, bulging, perforation.      Nose: Mild congestion with trace clear rhinorrhea. Bilateral turbinates are injected.      Mouth/Throat: Mucous membranes are moist. Pharynx without erythema, swelling, exudate. Uvula is midline. Airway is patent. Clear postnasal drainage. Neck: No stridor. Supple with full range of motion. Hematological/Lymphatic/Immunilogical: No cervical lymphadenopathy. Cardiovascular: Normal rate, regular rhythm. Normal S1 and S2.  Good peripheral circulation. Respiratory: Normal respiratory effort without tachypnea or retractions. Lungs with diffuse wheezing and mild rhonchi in the lower lobes. No Rales. Breath sounds can be heard throughout all lung fields. Neurologic:  Normal speech and language. No gross focal neurologic deficits are appreciated.  Skin:  Skin is warm,  dry and intact. No rash noted. Psychiatric: Mood and affect are normal. Speech and behavior are normal. Patient exhibits appropriate insight and judgement.   ____________________________________________   LABS  None ____________________________________________  EKG  None ____________________________________________  RADIOLOGY I, Braxton Feathers, personally viewed and evaluated these images (plain radiographs) as part of my medical decision making, as well as reviewing the written report by the radiologist.  Dg Chest 2 View  Result Date: 07/14/2016 CLINICAL DATA:  Cough and congestion x3 weeks. Tennis month. Fever 1 week ago. EXAM: CHEST  2 VIEW COMPARISON:  Port catheter tip terminates in the distal SVC. Heart and mediastinal contours are within normal limits for size. There is streaky atelectasis at the lung bases. No pneumonic consolidation, effusion or pneumothorax. No suspicious osseous abnormalities. Thoracolumbar degenerative disc disease, multilevel consistent with spondylosis.  FINDINGS: The heart size and mediastinal contours are within normal limits. Both lungs are clear. The visualized skeletal structures are unremarkable. IMPRESSION: No active cardiopulmonary disease. Electronically Signed   By: Ashley Royalty M.D.   On: 07/14/2016 13:31    ____________________________________________    PROCEDURES  Procedure(s) performed: None   Procedures   Medications  albuterol (PROVENTIL HFA;VENTOLIN HFA) 108 (90 Base) MCG/ACT inhaler 1-2 puff (not administered)  ipratropium-albuterol (DUONEB) 0.5-2.5 (3) MG/3ML nebulizer solution 3 mL (3 mLs Nebulization Given 07/14/16 1410)  methylPREDNISolone sodium succinate (SOLU-MEDROL) 125 mg/2 mL injection 80 mg (80 mg Intramuscular Given 07/14/16 1413)  acetaminophen (TYLENOL) tablet 650 mg (650 mg Oral Given 07/14/16 1423)  azithromycin (ZITHROMAX) tablet 500 mg (500 mg Oral Given 07/14/16 1521)     ____________________________________________   INITIAL IMPRESSION / ASSESSMENT AND PLAN / ED COURSE  Pertinent labs & imaging results that were available during my care of the patient were reviewed by me and considered in my medical decision making (see chart for details).  Clinical Course     Patient's diagnosis is consistent with Bronchitis and acute bronchospasm. Patient was given a DuoNeb nebulized solution while in the emergency department and tolerated well with improvement in shortness breath and chest congestion. Patient also received Solu-Medrol IM as well as oral Tylenol and tolerated well without side effects. Patient will be discharged home with prescriptions for azithromycin, albuterol inhaler, loratadine and promethazine DM to take as directed. Patient was given an albuterol inhaler and the first dose of her azithromycin while in the emergency department. Patient is to follow up with her primary care provider or [facility] if symptoms persist past this treatment course. Patient is given ED precautions to  return to the ED for any worsening or new symptoms.    ____________________________________________  FINAL CLINICAL IMPRESSION(S) / ED DIAGNOSES  Final diagnoses:  Bronchitis  Bronchospasm, acute      NEW MEDICATIONS STARTED DURING THIS VISIT:  Discharge Medication List as of 07/14/2016  3:00 PM    START taking these medications   Details  azithromycin (ZITHROMAX Z-PAK) 250 MG tablet Take 2 tablets (500 mg) on  Day 1,  followed by 1 tablet (250 mg) once daily on Days 2 through 5., Print    loratadine (CLARITIN) 10 MG tablet Take 1 tablet (10 mg total) by mouth daily., Starting Wed 07/14/2016, Print    promethazine-dextromethorphan (PROMETHAZINE-DM) 6.25-15 MG/5ML syrup Take 5 mLs by mouth 4 (four) times daily as needed for cough., Starting Wed 07/14/2016, Print             Judithe Modest [place], PA-C 07/14/16 1654    Lisa Roca, MD 07/16/16  1112  

## 2016-07-14 NOTE — ED Triage Notes (Signed)
Patient presents to the ED with cough and congestion x 3 weeks.  Patient reports history of asthma.  Patient reports she had a fever approx. 1 week ago but no fever but none since.  Patient denies vomiting and diarrhea.  Patient ambulatory to triage and speaking in full sentences without difficulty.  Patient states, "I kept hoping I would get better but I haven't been."

## 2016-07-14 NOTE — ED Notes (Signed)
Pt given albuterol inhaler per provider.

## 2016-08-20 ENCOUNTER — Ambulatory Visit: Payer: Medicare Other | Admitting: Internal Medicine

## 2016-08-20 ENCOUNTER — Other Ambulatory Visit: Payer: Medicare Other

## 2016-08-27 ENCOUNTER — Inpatient Hospital Stay: Payer: Medicare Other

## 2016-08-27 ENCOUNTER — Inpatient Hospital Stay: Payer: Medicare Other | Attending: Internal Medicine

## 2016-08-27 ENCOUNTER — Inpatient Hospital Stay (HOSPITAL_BASED_OUTPATIENT_CLINIC_OR_DEPARTMENT_OTHER): Payer: Medicare Other | Admitting: Internal Medicine

## 2016-08-27 VITALS — BP 115/77 | HR 106 | Temp 97.7°F | Wt 175.0 lb

## 2016-08-27 DIAGNOSIS — I129 Hypertensive chronic kidney disease with stage 1 through stage 4 chronic kidney disease, or unspecified chronic kidney disease: Secondary | ICD-10-CM | POA: Diagnosis not present

## 2016-08-27 DIAGNOSIS — Z803 Family history of malignant neoplasm of breast: Secondary | ICD-10-CM

## 2016-08-27 DIAGNOSIS — F419 Anxiety disorder, unspecified: Secondary | ICD-10-CM

## 2016-08-27 DIAGNOSIS — Z7901 Long term (current) use of anticoagulants: Secondary | ICD-10-CM

## 2016-08-27 DIAGNOSIS — I10 Essential (primary) hypertension: Secondary | ICD-10-CM

## 2016-08-27 DIAGNOSIS — Z79899 Other long term (current) drug therapy: Secondary | ICD-10-CM | POA: Insufficient documentation

## 2016-08-27 DIAGNOSIS — C801 Malignant (primary) neoplasm, unspecified: Secondary | ICD-10-CM

## 2016-08-27 DIAGNOSIS — Z452 Encounter for adjustment and management of vascular access device: Secondary | ICD-10-CM | POA: Diagnosis not present

## 2016-08-27 DIAGNOSIS — Z8619 Personal history of other infectious and parasitic diseases: Secondary | ICD-10-CM | POA: Insufficient documentation

## 2016-08-27 DIAGNOSIS — C8253 Diffuse follicle center lymphoma, intra-abdominal lymph nodes: Secondary | ICD-10-CM | POA: Diagnosis not present

## 2016-08-27 DIAGNOSIS — N189 Chronic kidney disease, unspecified: Secondary | ICD-10-CM | POA: Insufficient documentation

## 2016-08-27 DIAGNOSIS — R0602 Shortness of breath: Secondary | ICD-10-CM | POA: Insufficient documentation

## 2016-08-27 DIAGNOSIS — M7989 Other specified soft tissue disorders: Secondary | ICD-10-CM | POA: Diagnosis not present

## 2016-08-27 DIAGNOSIS — R079 Chest pain, unspecified: Secondary | ICD-10-CM

## 2016-08-27 DIAGNOSIS — J45909 Unspecified asthma, uncomplicated: Secondary | ICD-10-CM | POA: Diagnosis not present

## 2016-08-27 LAB — CBC WITH DIFFERENTIAL/PLATELET
Basophils Absolute: 0 10*3/uL (ref 0–0.1)
Basophils Relative: 0 %
Eosinophils Absolute: 0.1 10*3/uL (ref 0–0.7)
Eosinophils Relative: 2 %
HCT: 35.7 % (ref 35.0–47.0)
Hemoglobin: 12.4 g/dL (ref 12.0–16.0)
Lymphocytes Relative: 29 %
Lymphs Abs: 1.5 10*3/uL (ref 1.0–3.6)
MCH: 33.1 pg (ref 26.0–34.0)
MCHC: 34.8 g/dL (ref 32.0–36.0)
MCV: 94.9 fL (ref 80.0–100.0)
Monocytes Absolute: 0.4 10*3/uL (ref 0.2–0.9)
Monocytes Relative: 8 %
Neutro Abs: 3.1 10*3/uL (ref 1.4–6.5)
Neutrophils Relative %: 61 %
Platelets: 182 10*3/uL (ref 150–440)
RBC: 3.76 MIL/uL — ABNORMAL LOW (ref 3.80–5.20)
RDW: 14.1 % (ref 11.5–14.5)
WBC: 5.1 10*3/uL (ref 3.6–11.0)

## 2016-08-27 LAB — COMPREHENSIVE METABOLIC PANEL
ALT: 18 U/L (ref 14–54)
AST: 24 U/L (ref 15–41)
Albumin: 4 g/dL (ref 3.5–5.0)
Alkaline Phosphatase: 83 U/L (ref 38–126)
Anion gap: 8 (ref 5–15)
BUN: 20 mg/dL (ref 6–20)
CO2: 27 mmol/L (ref 22–32)
Calcium: 9.7 mg/dL (ref 8.9–10.3)
Chloride: 102 mmol/L (ref 101–111)
Creatinine, Ser: 1.37 mg/dL — ABNORMAL HIGH (ref 0.44–1.00)
GFR calc Af Amer: 44 mL/min — ABNORMAL LOW (ref >60)
GFR calc non Af Amer: 38 mL/min — ABNORMAL LOW (ref >60)
Glucose, Bld: 131 mg/dL — ABNORMAL HIGH (ref 65–99)
Potassium: 3.5 mmol/L (ref 3.5–5.1)
Sodium: 137 mmol/L (ref 135–145)
Total Bilirubin: 0.4 mg/dL (ref 0.3–1.2)
Total Protein: 7.2 g/dL (ref 6.5–8.1)

## 2016-08-27 LAB — LACTATE DEHYDROGENASE: LDH: 133 U/L (ref 98–192)

## 2016-08-27 MED ORDER — SODIUM CHLORIDE 0.9 % IJ SOLN: 10.0000 mL | INTRAMUSCULAR | Status: AC

## 2016-08-27 MED ORDER — SODIUM CHLORIDE 0.9% FLUSH
10.0000 mL | INTRAVENOUS | Status: DC | PRN
  Administered 2016-08-27: 10 mL via INTRAVENOUS
  Filled 2016-08-27: qty 10

## 2016-08-27 MED ORDER — HEPARIN SOD (PORK) LOCK FLUSH 100 UNIT/ML IV SOLN
500.0000 [IU] | Freq: Once | INTRAVENOUS | Status: AC
  Administered 2016-08-27: 500 [IU] via INTRAVENOUS
  Filled 2016-08-27: qty 5

## 2016-08-27 MED ORDER — HEPARIN SOD (PORK) LOCK FLUSH 100 UNIT/ML IV SOLN: 500.0000 [IU] | Freq: Once | INTRAVENOUS | Status: DC

## 2016-08-27 NOTE — Progress Notes (Signed)
[place] OFFICE PROGRESS NOTE  Patient Care Team: Creola Corn, DO as PCP - General (Family Medicine)   SUMMARY OF ONCOLOGIC HISTORY: Oncology History   # MAY 2009- DLBCL [Bx- laprscopic] s/p R-CHOP x8 [finished Oct 2009]  # FEB 2015- Recurrent DLBCL [right inguinal LN Bx-; PET- Left supra-renal LN; BMBx-neg] s/p RICE x3 [Dr.Pandit]; excellent Response; not Transplant candidate  [sec to poor social support]; s/p RT to right Inguinal LN; CT OCT 2016- NED.   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  Patient states that she continues to be under significant social stresses at home with son.  Her appetite remains good, no significant weight change  Patient denies any new lumps or bumps. No nausea or vomiting.  Chronic chest pain and shortness of breath with stress, Paxil refill obtained from PCP.  Denies cough.   REVIEW OF SYSTEMS:  A complete 10 point review of system is done which is negative except mentioned above/history of present illness.   PAST MEDICAL HISTORY :  Past Medical History:  Diagnosis Date  . Acute URI   . Allergy   . Anxiety   . Asthma   . Cancer ([place])    lymphoma  . Hepatitis B   . Hypertension     PAST SURGICAL HISTORY :  No past surgical history on file.  FAMILY HISTORY :   Family History  Problem Relation Age of Onset  . Cancer Daughter   . Breast cancer Neg Hx     SOCIAL HISTORY:   Social History  Substance Use Topics  . Smoking status: Never Smoker  . Smokeless tobacco: Never Used  . Alcohol use No    ALLERGIES:  is allergic to penicillins; prednisone; and sulfa antibiotics.  MEDICATIONS:  Current Outpatient Prescriptions  Medication Sig Dispense Refill  . albuterol (PROVENTIL  HFA;VENTOLIN HFA) 108 (90 Base) MCG/ACT inhaler Inhale 1-2 puffs into the lungs every 6 (six) hours as needed for wheezing or shortness of breath. 1 Inhaler 0  . alendronate (FOSAMAX) 70 MG tablet Take 70 mg by mouth once a week.     . calcium-vitamin D (CALCIUM 500/D) 500-200 MG-UNIT per tablet Take by mouth.    . cetirizine (ZYRTEC) 10 MG tablet TAKE 1 TABLET BY MOUTH DAILY FOR ALLERGIES  11  . Cyanocobalamin (VITAMIN B-12 CR) 1000 MCG TBCR Take by mouth.    . entecavir (BARACLUDE) 0.5 MG tablet Take 0.5 mg by mouth 2 (two) times daily.     Marland Kitchen gabapentin (NEURONTIN) 300 MG capsule Take 300 mg by mouth 3 (three) times daily.     Marland Kitchen ibuprofen (ADVIL,MOTRIN) 800 MG tablet   prn as needed    . ipratropium (ATROVENT HFA) 17 MCG/ACT inhaler Inhale into the lungs.    Marland Kitchen ipratropium-albuterol (DUONEB) 0.5-2.5 (3) MG/3ML SOLN Take 3 mLs by nebulization every 6 (six) hours as needed. 360 mL 1  . LORazepam (ATIVAN) 0.5 MG tablet Take by mouth.    . Multiple Vitamin (MULTI-VITAMINS) TABS Take by mouth.    Marland Kitchen PARoxetine (PAXIL) 10 MG tablet     . ranitidine (ZANTAC) 300 MG capsule Take by mouth.    Marland Kitchen tiZANidine (ZANAFLEX) 2 MG tablet Take 2 mg by mouth at bedtime.     Marland Kitchen  warfarin (COUMADIN) 1 MG tablet TAKE ONE (1) TABLET BY MOUTH EVERY DAY AT 6PM 90 tablet 2  . mirtazapine (REMERON) 15 MG tablet Take by mouth.     Current Facility-Administered Medications  Medication Dose Route Frequency Provider Last Rate Last Dose  . [START ON 10/22/2016] heparin lock flush 100 unit/mL  500 Units Intravenous Once Lucendia Herrlich, NP      . Derrill Memo ON 10/22/2016] sodium chloride 0.9 % injection 10 mL  10 mL Intravenous Q8 Weeks Lucendia Herrlich, NP       Facility-Administered Medications Ordered in Other Visits  Medication Dose Route Frequency Provider Last Rate Last Dose  . sodium chloride 0.9 % injection 10 mL  10 mL Intravenous PRN Forest Gleason, MD   10 mL at 12/18/14 1121    PHYSICAL EXAMINATION: ECOG PERFORMANCE  STATUS: 0 - Asymptomatic  BP 115/77 (BP Location: Right Arm, Patient Position: Sitting)   Pulse (!) 106   Temp 97.7 F (36.5 C) (Tympanic)   Wt 175 lb (79.4 kg)   BMI 30.04 kg/m   Filed Weights   08/27/16 1401  Weight: 175 lb (79.4 kg)    GENERAL: Well-nourished well-developed; Alert, no distress and comfortable.   With cousin; able to sit on exam table with minimal help.  EYES: no pallor or icterus OROPHARYNX: no thrush or ulceration; dentures.  NECK: supple, no masses felt LYMPH:  no palpable lymphadenopathy in the cervical, axillary or inguinal regions LUNGS: clear to auscultation and  No wheeze or crackles HEART/CVS: regular rate & rhythm and no murmurs; No lower extremity edema ABDOMEN:abdomen soft, non-tender and normal bowel sounds Musculoskeletal:no cyanosis of digits and no clubbing  PSYCH: alert & oriented x 3 with fluent speech NEURO: no focal motor/sensory deficits SKIN:  no rashes or significant lesions  LABORATORY DATA:  I have reviewed the data as listed    Component Value Date/Time   NA 137 08/27/2016 1327   NA 142 11/20/2013 0842   K 3.5 08/27/2016 1327   K 4.0 11/20/2013 0842   CL 102 08/27/2016 1327   CL 105 11/20/2013 0842   CO2 27 08/27/2016 1327   CO2 30 11/20/2013 0842   GLUCOSE 131 (H) 08/27/2016 1327   GLUCOSE 94 11/20/2013 0842   BUN 20 08/27/2016 1327   BUN 12 11/20/2013 0842   CREATININE 1.37 (H) 08/27/2016 1327   CREATININE 1.06 05/07/2014 0937   CALCIUM 9.7 08/27/2016 1327   CALCIUM 8.3 (L) 11/20/2013 0842   PROT 7.2 08/27/2016 1327   PROT 6.9 05/07/2014 0937   ALBUMIN 4.0 08/27/2016 1327   ALBUMIN 3.5 05/07/2014 0937   AST 24 08/27/2016 1327   AST 20 05/07/2014 0937   ALT 18 08/27/2016 1327   ALT 22 05/07/2014 0937   ALKPHOS 83 08/27/2016 1327   ALKPHOS 143 (H) 05/07/2014 0937   BILITOT 0.4 08/27/2016 1327   BILITOT 0.3 05/07/2014 0937   GFRNONAA 38 (L) 08/27/2016 1327   GFRNONAA 55 (L) 05/07/2014 0937   GFRNONAA 50 (L)  02/12/2014 1104   GFRAA 44 (L) 08/27/2016 1327   GFRAA >60 05/07/2014 0937   GFRAA 58 (L) 02/12/2014 1104    No results found for: SPEP, UPEP  Lab Results  Component Value Date   WBC 5.1 08/27/2016   NEUTROABS 3.1 08/27/2016   HGB 12.4 08/27/2016   HCT 35.7 08/27/2016   MCV 94.9 08/27/2016   PLT 182 08/27/2016      Chemistry      Component Value Date/Time  NA 137 08/27/2016 1327   NA 142 11/20/2013 0842   K 3.5 08/27/2016 1327   K 4.0 11/20/2013 0842   CL 102 08/27/2016 1327   CL 105 11/20/2013 0842   CO2 27 08/27/2016 1327   CO2 30 11/20/2013 0842   BUN 20 08/27/2016 1327   BUN 12 11/20/2013 0842   CREATININE 1.37 (H) 08/27/2016 1327   CREATININE 1.06 05/07/2014 0937      Component Value Date/Time   CALCIUM 9.7 08/27/2016 1327   CALCIUM 8.3 (L) 11/20/2013 0842   ALKPHOS 83 08/27/2016 1327   ALKPHOS 143 (H) 05/07/2014 0937   AST 24 08/27/2016 1327   AST 20 05/07/2014 0937   ALT 18 08/27/2016 1327   ALT 22 05/07/2014 0937   BILITOT 0.4 08/27/2016 1327   BILITOT 0.3 05/07/2014 0937        ASSESSMENT & PLAN:   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 Clinically no evidence of recurrence noted.  Last CT October 2016- no recurrent disease. For now recommend continued follow-up on a clinical basis.  # question hepatitis B/abnormal liver enzymes- liver enzymes today are within normal limits.  # Port flushes- q 6-8 w  # chronic kidney disease creatinine of 1.37- reviewed with the patient. Encouraged to continue drinking lots of fluids.  # swelling in legs/ recommend elevation/ compression stockings. No diuretics.   & Anxiety/stress: continue Paxil as prescribed; followed by PCP.  # patient will follow-up with me in approximately 6 months/labs.    Lucendia Herrlich,  NP 08/27/2016 2:34 PM

## 2016-08-27 NOTE — Assessment & Plan Note (Addendum)
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 Clinically no evidence of recurrence noted.  Last CT October 2016- no recurrent disease. For now recommend continued follow-up on a clinical basis.  # question hepatitis B/abnormal liver enzymes- liver enzymes today are within normal limits.  # Port flushes- q 6-8 w  # chronic kidney disease creatinine of 1.37- reviewed with the patient. Encouraged to continue drinking lots of fluids.  # swelling in legs/ recommend elevation/ compression stockings. No diuretics.   & Anxiety/stress: continue Paxil as prescribed; followed by PCP.  # patient will follow-up with me in approximately 6 months/labs.

## 2016-09-03 ENCOUNTER — Inpatient Hospital Stay: Payer: Medicare Other

## 2016-09-03 DIAGNOSIS — Z95828 Presence of other vascular implants and grafts: Secondary | ICD-10-CM

## 2016-09-03 DIAGNOSIS — C8253 Diffuse follicle center lymphoma, intra-abdominal lymph nodes: Secondary | ICD-10-CM | POA: Diagnosis not present

## 2016-09-03 MED ORDER — HEPARIN SOD (PORK) LOCK FLUSH 100 UNIT/ML IV SOLN
500.0000 [IU] | Freq: Once | INTRAVENOUS | Status: AC
  Administered 2016-09-03: 500 [IU] via INTRAVENOUS

## 2016-09-03 MED ORDER — SODIUM CHLORIDE 0.9% FLUSH
10.0000 mL | INTRAVENOUS | Status: DC | PRN
  Administered 2016-09-03: 10 mL via INTRAVENOUS
  Filled 2016-09-03: qty 10

## 2016-10-22 ENCOUNTER — Inpatient Hospital Stay: Payer: Medicare Other | Attending: Internal Medicine

## 2016-10-22 DIAGNOSIS — Z452 Encounter for adjustment and management of vascular access device: Secondary | ICD-10-CM | POA: Insufficient documentation

## 2016-10-22 DIAGNOSIS — Z95828 Presence of other vascular implants and grafts: Secondary | ICD-10-CM

## 2016-10-22 DIAGNOSIS — Z8572 Personal history of non-Hodgkin lymphomas: Secondary | ICD-10-CM | POA: Insufficient documentation

## 2016-10-22 MED ORDER — SODIUM CHLORIDE 0.9% FLUSH
10.0000 mL | INTRAVENOUS | Status: DC | PRN
  Administered 2016-10-22: 10 mL via INTRAVENOUS
  Filled 2016-10-22: qty 10

## 2016-10-22 MED ORDER — HEPARIN SOD (PORK) LOCK FLUSH 100 UNIT/ML IV SOLN
500.0000 [IU] | Freq: Once | INTRAVENOUS | Status: AC
  Administered 2016-10-22: 500 [IU] via INTRAVENOUS

## 2016-10-27 ENCOUNTER — Other Ambulatory Visit: Payer: Self-pay | Admitting: Oncology

## 2016-10-28 NOTE — Telephone Encounter (Signed)
Per Dr Rogue Bussing okay to refill #90 with 1 refill

## 2016-12-17 ENCOUNTER — Inpatient Hospital Stay: Payer: Medicare Other | Attending: Internal Medicine

## 2016-12-17 DIAGNOSIS — Z452 Encounter for adjustment and management of vascular access device: Secondary | ICD-10-CM | POA: Insufficient documentation

## 2016-12-17 DIAGNOSIS — Z8572 Personal history of non-Hodgkin lymphomas: Secondary | ICD-10-CM | POA: Diagnosis present

## 2016-12-17 DIAGNOSIS — Z95828 Presence of other vascular implants and grafts: Secondary | ICD-10-CM

## 2016-12-17 MED ORDER — HEPARIN SOD (PORK) LOCK FLUSH 100 UNIT/ML IV SOLN
500.0000 [IU] | Freq: Once | INTRAVENOUS | Status: AC
  Administered 2016-12-17: 500 [IU] via INTRAVENOUS

## 2016-12-17 MED ORDER — SODIUM CHLORIDE 0.9% FLUSH
10.0000 mL | INTRAVENOUS | Status: DC | PRN
  Administered 2016-12-17: 10 mL via INTRAVENOUS
  Filled 2016-12-17: qty 10

## 2017-02-11 ENCOUNTER — Inpatient Hospital Stay: Payer: Medicare Other | Attending: Internal Medicine

## 2017-02-11 DIAGNOSIS — Z452 Encounter for adjustment and management of vascular access device: Secondary | ICD-10-CM | POA: Insufficient documentation

## 2017-02-11 DIAGNOSIS — Z8572 Personal history of non-Hodgkin lymphomas: Secondary | ICD-10-CM | POA: Diagnosis present

## 2017-02-11 DIAGNOSIS — Z95828 Presence of other vascular implants and grafts: Secondary | ICD-10-CM

## 2017-02-11 MED ORDER — SODIUM CHLORIDE 0.9% FLUSH
10.0000 mL | INTRAVENOUS | Status: DC | PRN
  Administered 2017-02-11: 10 mL via INTRAVENOUS
  Filled 2017-02-11: qty 10

## 2017-02-11 MED ORDER — HEPARIN SOD (PORK) LOCK FLUSH 100 UNIT/ML IV SOLN
500.0000 [IU] | Freq: Once | INTRAVENOUS | Status: AC
  Administered 2017-02-11: 500 [IU] via INTRAVENOUS

## 2017-03-22 ENCOUNTER — Other Ambulatory Visit: Payer: Self-pay | Admitting: Family Medicine

## 2017-03-22 DIAGNOSIS — Z1231 Encounter for screening mammogram for malignant neoplasm of breast: Secondary | ICD-10-CM

## 2017-03-25 ENCOUNTER — Telehealth: Payer: Self-pay | Admitting: Internal Medicine

## 2017-03-25 ENCOUNTER — Inpatient Hospital Stay (HOSPITAL_BASED_OUTPATIENT_CLINIC_OR_DEPARTMENT_OTHER): Payer: Medicare Other | Admitting: Internal Medicine

## 2017-03-25 ENCOUNTER — Inpatient Hospital Stay: Payer: Medicare Other | Attending: Internal Medicine

## 2017-03-25 VITALS — BP 119/76 | HR 83 | Temp 98.2°F | Resp 16 | Wt 185.6 lb

## 2017-03-25 DIAGNOSIS — Z7951 Long term (current) use of inhaled steroids: Secondary | ICD-10-CM

## 2017-03-25 DIAGNOSIS — Z95828 Presence of other vascular implants and grafts: Secondary | ICD-10-CM

## 2017-03-25 DIAGNOSIS — I129 Hypertensive chronic kidney disease with stage 1 through stage 4 chronic kidney disease, or unspecified chronic kidney disease: Secondary | ICD-10-CM

## 2017-03-25 DIAGNOSIS — Z8619 Personal history of other infectious and parasitic diseases: Secondary | ICD-10-CM | POA: Diagnosis not present

## 2017-03-25 DIAGNOSIS — Z452 Encounter for adjustment and management of vascular access device: Secondary | ICD-10-CM | POA: Insufficient documentation

## 2017-03-25 DIAGNOSIS — J45909 Unspecified asthma, uncomplicated: Secondary | ICD-10-CM | POA: Diagnosis not present

## 2017-03-25 DIAGNOSIS — N189 Chronic kidney disease, unspecified: Secondary | ICD-10-CM

## 2017-03-25 DIAGNOSIS — Z800 Family history of malignant neoplasm of digestive organs: Secondary | ICD-10-CM | POA: Diagnosis not present

## 2017-03-25 DIAGNOSIS — C8253 Diffuse follicle center lymphoma, intra-abdominal lymph nodes: Secondary | ICD-10-CM | POA: Diagnosis present

## 2017-03-25 DIAGNOSIS — Z79899 Other long term (current) drug therapy: Secondary | ICD-10-CM | POA: Insufficient documentation

## 2017-03-25 DIAGNOSIS — F419 Anxiety disorder, unspecified: Secondary | ICD-10-CM | POA: Diagnosis not present

## 2017-03-25 DIAGNOSIS — Z7901 Long term (current) use of anticoagulants: Secondary | ICD-10-CM

## 2017-03-25 LAB — CBC WITH DIFFERENTIAL/PLATELET
Basophils Absolute: 0 10*3/uL (ref 0–0.1)
Basophils Relative: 0 %
Eosinophils Absolute: 0.1 10*3/uL (ref 0–0.7)
Eosinophils Relative: 2 %
HCT: 35.1 % (ref 35.0–47.0)
Hemoglobin: 12.2 g/dL (ref 12.0–16.0)
Lymphocytes Relative: 37 %
Lymphs Abs: 1.5 10*3/uL (ref 1.0–3.6)
MCH: 32.7 pg (ref 26.0–34.0)
MCHC: 34.8 g/dL (ref 32.0–36.0)
MCV: 94.1 fL (ref 80.0–100.0)
Monocytes Absolute: 0.4 10*3/uL (ref 0.2–0.9)
Monocytes Relative: 10 %
Neutro Abs: 2 10*3/uL (ref 1.4–6.5)
Neutrophils Relative %: 51 %
Platelets: 160 10*3/uL (ref 150–440)
RBC: 3.73 MIL/uL — ABNORMAL LOW (ref 3.80–5.20)
RDW: 13.3 % (ref 11.5–14.5)
WBC: 4 10*3/uL (ref 3.6–11.0)

## 2017-03-25 LAB — COMPREHENSIVE METABOLIC PANEL
ALT: 15 U/L (ref 14–54)
AST: 23 U/L (ref 15–41)
Albumin: 3.7 g/dL (ref 3.5–5.0)
Alkaline Phosphatase: 90 U/L (ref 38–126)
Anion gap: 7 (ref 5–15)
BUN: 21 mg/dL — ABNORMAL HIGH (ref 6–20)
CO2: 26 mmol/L (ref 22–32)
Calcium: 9.4 mg/dL (ref 8.9–10.3)
Chloride: 104 mmol/L (ref 101–111)
Creatinine, Ser: 1.29 mg/dL — ABNORMAL HIGH (ref 0.44–1.00)
GFR calc Af Amer: 48 mL/min — ABNORMAL LOW (ref >60)
GFR calc non Af Amer: 41 mL/min — ABNORMAL LOW (ref >60)
Glucose, Bld: 103 mg/dL — ABNORMAL HIGH (ref 65–99)
Potassium: 4 mmol/L (ref 3.5–5.1)
Sodium: 137 mmol/L (ref 135–145)
Total Bilirubin: 0.4 mg/dL (ref 0.3–1.2)
Total Protein: 7.2 g/dL (ref 6.5–8.1)

## 2017-03-25 LAB — LACTATE DEHYDROGENASE: LDH: 127 U/L (ref 98–192)

## 2017-03-25 MED ORDER — SODIUM CHLORIDE 0.9% FLUSH
10.0000 mL | INTRAVENOUS | Status: AC | PRN
  Administered 2017-03-25: 10 mL
  Filled 2017-03-25: qty 10

## 2017-03-25 MED ORDER — HEPARIN SOD (PORK) LOCK FLUSH 100 UNIT/ML IV SOLN
500.0000 [IU] | INTRAVENOUS | Status: AC | PRN
  Administered 2017-03-25: 500 [IU]
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
## 2017-03-25 NOTE — Telephone Encounter (Signed)
Please add/schedule patient- port flush every 2 months

## 2017-03-25 NOTE — Assessment & Plan Note (Addendum)
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 Clinically no evidence of recurrence noted.   # Port flushes- q 8 w [1 more year]; discussed re: explantation. However patient was to keep it.  # chronic kidney disease creatinine of 1.27/stable- reviewed with the patient.  If worse, reocmmend folow up with PCP.  # patient will follow-up with me in approximately 6 months/labs.   CC; PCP

## 2017-03-30 NOTE — Telephone Encounter (Signed)
Colette, please schedule patient for port flush every 2 months. Thanks!

## 2017-04-28 ENCOUNTER — Other Ambulatory Visit: Payer: Self-pay | Admitting: Internal Medicine

## 2017-05-02 ENCOUNTER — Ambulatory Visit
Admission: RE | Admit: 2017-05-02 | Discharge: 2017-05-02 | Disposition: A | Discharge status: Home / Self Care | Payer: Medicare Other | Source: Ambulatory Visit | Attending: Family Medicine | Admitting: Family Medicine

## 2017-05-02 DIAGNOSIS — Z1231 Encounter for screening mammogram for malignant neoplasm of breast: Secondary | ICD-10-CM | POA: Diagnosis present

## 2017-05-16 ENCOUNTER — Inpatient Hospital Stay: Payer: Medicare Other | Attending: Internal Medicine

## 2017-05-16 DIAGNOSIS — C8253 Diffuse follicle center lymphoma, intra-abdominal lymph nodes: Secondary | ICD-10-CM | POA: Diagnosis present

## 2017-05-16 DIAGNOSIS — Z9221 Personal history of antineoplastic chemotherapy: Secondary | ICD-10-CM | POA: Diagnosis not present

## 2017-05-16 DIAGNOSIS — Z452 Encounter for adjustment and management of vascular access device: Secondary | ICD-10-CM | POA: Diagnosis not present

## 2017-05-16 DIAGNOSIS — Z95828 Presence of other vascular implants and grafts: Secondary | ICD-10-CM

## 2017-05-16 MED ORDER — SODIUM CHLORIDE 0.9% FLUSH
10.0000 mL | INTRAVENOUS | Status: AC | PRN
  Administered 2017-05-16: 10 mL
  Filled 2017-05-16: qty 10

## 2017-05-16 MED ORDER — HEPARIN SOD (PORK) LOCK FLUSH 100 UNIT/ML IV SOLN
500.0000 [IU] | INTRAVENOUS | Status: AC | PRN
  Administered 2017-05-16: 500 [IU]

## 2017-06-28 ENCOUNTER — Other Ambulatory Visit: Payer: Self-pay | Admitting: Internal Medicine

## 2017-07-11 ENCOUNTER — Inpatient Hospital Stay: Payer: Medicare Other | Attending: Internal Medicine

## 2017-07-15 ENCOUNTER — Other Ambulatory Visit: Payer: Self-pay | Admitting: Family Medicine

## 2017-07-15 DIAGNOSIS — M8580 Other specified disorders of bone density and structure, unspecified site: Secondary | ICD-10-CM

## 2017-08-17 ENCOUNTER — Ambulatory Visit
Admission: RE | Admit: 2017-08-17 | Discharge: 2017-08-17 | Disposition: A | Discharge status: Home / Self Care | Payer: Medicare Other | Source: Ambulatory Visit | Attending: Family Medicine | Admitting: Family Medicine

## 2017-08-17 DIAGNOSIS — M810 Age-related osteoporosis without current pathological fracture: Secondary | ICD-10-CM | POA: Diagnosis not present

## 2017-08-17 DIAGNOSIS — M8580 Other specified disorders of bone density and structure, unspecified site: Secondary | ICD-10-CM | POA: Diagnosis present

## 2017-09-23 ENCOUNTER — Other Ambulatory Visit: Payer: Self-pay

## 2017-09-23 ENCOUNTER — Other Ambulatory Visit: Payer: Self-pay | Admitting: *Deleted

## 2017-09-23 ENCOUNTER — Inpatient Hospital Stay: Payer: Medicare Other | Attending: Internal Medicine

## 2017-09-23 ENCOUNTER — Inpatient Hospital Stay (HOSPITAL_BASED_OUTPATIENT_CLINIC_OR_DEPARTMENT_OTHER): Payer: Medicare Other | Admitting: Internal Medicine

## 2017-09-23 ENCOUNTER — Encounter: Payer: Self-pay | Admitting: Internal Medicine

## 2017-09-23 VITALS — BP 117/65 | HR 88 | Temp 98.0°F | Resp 20 | Ht 64.0 in | Wt 184.0 lb

## 2017-09-23 DIAGNOSIS — Z882 Allergy status to sulfonamides status: Secondary | ICD-10-CM

## 2017-09-23 DIAGNOSIS — Z880 Allergy status to penicillin: Secondary | ICD-10-CM

## 2017-09-23 DIAGNOSIS — Z888 Allergy status to other drugs, medicaments and biological substances status: Secondary | ICD-10-CM | POA: Diagnosis not present

## 2017-09-23 DIAGNOSIS — Z95828 Presence of other vascular implants and grafts: Secondary | ICD-10-CM

## 2017-09-23 DIAGNOSIS — Z79899 Other long term (current) drug therapy: Secondary | ICD-10-CM | POA: Diagnosis not present

## 2017-09-23 DIAGNOSIS — C8253 Diffuse follicle center lymphoma, intra-abdominal lymph nodes: Secondary | ICD-10-CM | POA: Insufficient documentation

## 2017-09-23 DIAGNOSIS — I129 Hypertensive chronic kidney disease with stage 1 through stage 4 chronic kidney disease, or unspecified chronic kidney disease: Secondary | ICD-10-CM | POA: Insufficient documentation

## 2017-09-23 DIAGNOSIS — Z7901 Long term (current) use of anticoagulants: Secondary | ICD-10-CM | POA: Insufficient documentation

## 2017-09-23 DIAGNOSIS — N189 Chronic kidney disease, unspecified: Secondary | ICD-10-CM

## 2017-09-23 DIAGNOSIS — R5383 Other fatigue: Secondary | ICD-10-CM

## 2017-09-23 LAB — IRON AND TIBC
Iron: 45 ug/dL (ref 28–170)
Saturation Ratios: 18 % (ref 10.4–31.8)
TIBC: 247 ug/dL — ABNORMAL LOW (ref 250–450)
UIBC: 202 ug/dL

## 2017-09-23 LAB — CBC WITH DIFFERENTIAL/PLATELET
Basophils Absolute: 0 10*3/uL (ref 0–0.1)
Basophils Relative: 1 %
Eosinophils Absolute: 0.1 10*3/uL (ref 0–0.7)
Eosinophils Relative: 2 %
HCT: 34 % — ABNORMAL LOW (ref 35.0–47.0)
Hemoglobin: 11.8 g/dL — ABNORMAL LOW (ref 12.0–16.0)
Lymphocytes Relative: 35 %
Lymphs Abs: 1.6 10*3/uL (ref 1.0–3.6)
MCH: 32.7 pg (ref 26.0–34.0)
MCHC: 34.6 g/dL (ref 32.0–36.0)
MCV: 94.3 fL (ref 80.0–100.0)
Monocytes Absolute: 0.4 10*3/uL (ref 0.2–0.9)
Monocytes Relative: 9 %
Neutro Abs: 2.4 10*3/uL (ref 1.4–6.5)
Neutrophils Relative %: 53 %
Platelets: 157 10*3/uL (ref 150–440)
RBC: 3.6 MIL/uL — ABNORMAL LOW (ref 3.80–5.20)
RDW: 13.4 % (ref 11.5–14.5)
WBC: 4.5 10*3/uL (ref 3.6–11.0)

## 2017-09-23 LAB — COMPREHENSIVE METABOLIC PANEL
ALT: 13 U/L — ABNORMAL LOW (ref 14–54)
AST: 23 U/L (ref 15–41)
Albumin: 3.6 g/dL (ref 3.5–5.0)
Alkaline Phosphatase: 114 U/L (ref 38–126)
Anion gap: 9 (ref 5–15)
BUN: 16 mg/dL (ref 6–20)
CO2: 23 mmol/L (ref 22–32)
Calcium: 8.7 mg/dL — ABNORMAL LOW (ref 8.9–10.3)
Chloride: 105 mmol/L (ref 101–111)
Creatinine, Ser: 1.36 mg/dL — ABNORMAL HIGH (ref 0.44–1.00)
GFR calc Af Amer: 45 mL/min — ABNORMAL LOW (ref >60)
GFR calc non Af Amer: 38 mL/min — ABNORMAL LOW (ref >60)
Glucose, Bld: 112 mg/dL — ABNORMAL HIGH (ref 65–99)
Potassium: 3.7 mmol/L (ref 3.5–5.1)
Sodium: 137 mmol/L (ref 135–145)
Total Bilirubin: 0.2 mg/dL — ABNORMAL LOW (ref 0.3–1.2)
Total Protein: 6.6 g/dL (ref 6.5–8.1)

## 2017-09-23 LAB — FERRITIN: Ferritin: 106 ng/mL (ref 11–307)

## 2017-09-23 LAB — LACTATE DEHYDROGENASE: LDH: 126 U/L (ref 98–192)

## 2017-09-23 MED ORDER — HEPARIN SOD (PORK) LOCK FLUSH 100 UNIT/ML IV SOLN
500.0000 [IU] | INTRAVENOUS | Status: AC | PRN
  Administered 2017-09-23: 500 [IU]

## 2017-09-23 MED ORDER — SODIUM CHLORIDE 0.9% FLUSH
10.0000 mL | INTRAVENOUS | Status: AC | PRN
  Administered 2017-09-23: 10 mL
  Filled 2017-09-23: qty 10
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
## 2017-09-23 NOTE — Assessment & Plan Note (Addendum)
Check iron studies.  # 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  # Clinically no evidence of recurrence noted.   # Port flushes- q 8 w/coumadin 1mg /day; [ patient was to keep it.].    # Mild anemia- Hb 11.8- ? Sec to CKD/patient is on p.o. iron once a day.  Reluctant for IV iron [secondary to ride]  # chronic kidney disease creatinine of 1.36/stable- reviewed with the patient.  If worse, reocmmend folow up with PCP.  # Fatigue- ? [facility] care program [pt does not have car]; check iron studies/ferritin.   # patient will follow-up with me in approximately 6 months/labs; check Iron studies/ferritin today [reluctant sec to ride]  CC; PCP

## 2017-09-23 NOTE — Addendum Note (Signed)
Addended by: Sandria Bales B on: 09/23/2017 03:23 PM   Modules accepted: Orders

## 2017-10-03 ENCOUNTER — Emergency Department: Payer: Medicare Other

## 2017-10-03 ENCOUNTER — Emergency Department
Admission: EM | Admit: 2017-10-03 | Discharge: 2017-10-03 | Disposition: A | Discharge status: Home / Self Care | Payer: Medicare Other | Source: Home / Self Care | Attending: Emergency Medicine | Admitting: Emergency Medicine

## 2017-10-03 ENCOUNTER — Encounter: Payer: Self-pay | Admitting: Emergency Medicine

## 2017-10-03 DIAGNOSIS — J181 Lobar pneumonia, unspecified organism: Secondary | ICD-10-CM | POA: Diagnosis not present

## 2017-10-03 DIAGNOSIS — J9801 Acute bronchospasm: Secondary | ICD-10-CM

## 2017-10-03 DIAGNOSIS — Z7901 Long term (current) use of anticoagulants: Secondary | ICD-10-CM

## 2017-10-03 DIAGNOSIS — Z79899 Other long term (current) drug therapy: Secondary | ICD-10-CM | POA: Insufficient documentation

## 2017-10-03 DIAGNOSIS — I10 Essential (primary) hypertension: Secondary | ICD-10-CM | POA: Insufficient documentation

## 2017-10-03 DIAGNOSIS — Z8572 Personal history of non-Hodgkin lymphomas: Secondary | ICD-10-CM

## 2017-10-03 MED ORDER — KETOROLAC TROMETHAMINE 30 MG/ML IJ SOLN
30.0000 mg | Freq: Once | INTRAMUSCULAR | Status: AC
  Administered 2017-10-03: 30 mg via INTRAMUSCULAR
  Filled 2017-10-03: qty 1

## 2017-10-03 MED ORDER — BENZONATATE 100 MG PO CAPS: 200.0000 mg | ORAL_CAPSULE | Freq: Three times a day (TID) | ORAL | 0 refills | Status: DC | PRN

## 2017-10-03 MED ORDER — HYDROCOD POLST-CPM POLST ER 10-8 MG/5ML PO SUER
5.0000 mL | Freq: Once | ORAL | Status: AC
  Administered 2017-10-03: 5 mL via ORAL
  Filled 2017-10-03: qty 5

## 2017-10-03 MED ORDER — IPRATROPIUM-ALBUTEROL 0.5-2.5 (3) MG/3ML IN SOLN
3.0000 mL | Freq: Once | RESPIRATORY_TRACT | Status: AC
  Administered 2017-10-03: 3 mL via RESPIRATORY_TRACT
  Filled 2017-10-03: qty 3

## 2017-10-03 MED ORDER — HYDROCOD POLST-CPM POLST ER 10-8 MG/5ML PO SUER: 5.0000 mL | Freq: Every evening | ORAL | 0 refills | Status: DC | PRN

## 2017-10-03 MED ORDER — BENZONATATE 100 MG PO CAPS
200.0000 mg | ORAL_CAPSULE | Freq: Once | ORAL | Status: AC
  Administered 2017-10-03: 200 mg via ORAL
  Filled 2017-10-03: qty 2

## 2017-10-03 NOTE — ED Triage Notes (Signed)
FIRST NURSE NOTE-cough and asthma flare for last week.  Nebulizer not helping.  Ambulatory.

## 2017-10-03 NOTE — ED Provider Notes (Signed)
[facility] Emergency Department Provider Note   ____________________________________________   First MD Initiated Contact with Patient 10/03/17 1216     (approximate)  I have reviewed the triage vital signs and the nursing notes.   HISTORY  Chief Complaint Asthma and Cough    HPI MERIAL MORITZ is a 72 y.o. female patient with complain of wheezing which she believed is secondary to her allergies.  Patient state she is also concerned because she has a productive greenish cough.  Patient state home nebulizers are not helping.  Patient appears in no acute distress.  Patient complained of chest wall pain secondary to coughing.  Patient rates the pain as a 3/10.  Patient described the pain is "achy".   Past Medical History:  Diagnosis Date  . Acute URI   . Allergy   . Anxiety   . Asthma   . Cancer ([place])    lymphoma  . Hepatitis B   . Hypertension     Patient Active Problem List   Diagnosis Date Noted  . 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 Disease of liver 12/18/2014  . Acute URI 11/07/2014  . BP (high blood pressure) 05/09/2014  . Chronic hepatitis B virus infection ([place]) 03/27/2011  . Lymphoma ([place]) 10/27/2007    History reviewed. No pertinent surgical history.  Prior to Admission medications   Medication Sig Start Date End Date Taking? Authorizing Provider  albuterol (PROVENTIL HFA;VENTOLIN HFA) 108 (90 Base) MCG/ACT inhaler Inhale 1-2 puffs into the lungs every 6 (six) hours as needed for wheezing or shortness of breath. 07/14/16   Hagler, Jami L, PA-C  alendronate (FOSAMAX) 70 MG tablet Take 70 mg by mouth once a week.  03/26/15   [provider]  benzonatate (TESSALON PERLES) 100 MG capsule Take 2 capsules (200 mg total) by mouth 3 (three) times daily as needed. 10/03/17 10/03/18  Sable Feil, PA-C  calcium-vitamin D (CALCIUM 500/D) 500-200 MG-UNIT per tablet Take 1 tablet by mouth  daily with breakfast.     [provider]  cetirizine (ZYRTEC) 10 MG tablet TAKE 1 TABLET BY MOUTH DAILY FOR ALLERGIES 12/04/14   [provider]  chlorpheniramine-HYDROcodone (TUSSIONEX PENNKINETIC ER) 10-8 MG/5ML SUER Take 5 mLs by mouth at bedtime as needed for cough. 10/03/17   Sable Feil, PA-C  Cyanocobalamin (VITAMIN B-12 CR) 1000 MCG TBCR Take by mouth.    [provider]  entecavir (BARACLUDE) 0.5 MG tablet Take 0.5 mg by mouth 2 (two) times daily.  08/06/14   [provider]  gabapentin (NEURONTIN) 300 MG capsule Take 300 mg by mouth 3 (three) times daily.  08/25/09   [provider]  ibuprofen (ADVIL,MOTRIN) 800 MG tablet 1 tablet orally  prn as needed 04/17/10   [provider]  ipratropium (ATROVENT HFA) 17 MCG/ACT inhaler Inhale into the lungs.    [provider]  ipratropium-albuterol (DUONEB) 0.5-2.5 (3) MG/3ML SOLN Take 3 mLs by nebulization every 6 (six) hours as needed. Patient not taking: Reported on 09/23/2017 05/27/16   Earleen Newport, MD  LORazepam (ATIVAN) 0.5 MG tablet Take 0.5 mg by mouth every 8 (eight) hours as needed for anxiety.     [provider]  mirtazapine (REMERON) 15 MG tablet Take 15 mg by mouth at bedtime.     [provider]  Multiple Vitamin (MULTI-VITAMINS) TABS Take 1 tablet by mouth daily.     [provider]  PARoxetine (PAXIL)  10 MG tablet Take 10 mg by mouth daily.  08/07/15   [provider]  ranitidine (ZANTAC) 300 MG capsule Take by mouth.    [provider]  tiZANidine (ZANAFLEX) 2 MG tablet Take 2 mg by mouth at bedtime.  07/07/13   [provider]  warfarin (COUMADIN) 1 MG tablet TAKE ONE TABLET BY MOUTH EVERY DAY AT [facility] 06/29/17   Cammie Sickle, MD    Allergies Penicillins; Prednisone; and Sulfa antibiotics  Family History  Problem Relation Age of Onset  . Cancer Daughter   . Breast cancer Neg Hx     Social  History Social History   Tobacco Use  . Smoking status: Never Smoker  . Smokeless tobacco: Never Used  Substance Use Topics  . Alcohol use: No  . Drug use: Not on file    Review of Systems Constitutional: No fever/chills Eyes: No visual changes. ENT: No sore throat. Cardiovascular: Denies chest pain. Respiratory: Cough and wheezing. Gastrointestinal: No abdominal pain.  No nausea, no vomiting.  No diarrhea.  No constipation. Genitourinary: Negative for dysuria. Musculoskeletal: Negative for back pain. Skin: Negative for rash. Neurological: Negative for headaches, focal weakness or numbness. Endocrine:Hepatitis B and hypertension. Allergic/Immunilogical: Penicillin, prednisone, and sulfa products.  ____________________________________________   PHYSICAL EXAM:  VITAL SIGNS: ED Triage Vitals  Enc Vitals Group     BP 10/03/17 1148 113/72     Pulse Rate 10/03/17 1148 (!) 108     Resp 10/03/17 1148 20     Temp 10/03/17 1148 97.9 F (36.6 C)     Temp Source 10/03/17 1148 Oral     SpO2 10/03/17 1148 98 %     Weight 10/03/17 1150 185 lb (83.9 kg)     Height 10/03/17 1150 5\' 3"  (1.6 m)     Head Circumference --      Peak Flow --      Pain Score --      Pain Loc --      Pain Edu? --      Excl. in [place]? --    Constitutional: Alert and oriented. Well appearing and in no acute distress. Neck: No stridor.   Cardiovascular: Normal rate, regular rhythm. Grossly normal heart sounds.  Good peripheral circulation. Respiratory: Normal respiratory effort.  No retractions.  Bilateral wheezing and coughing.  Patient is talking normal sentences. Neurologic:  Normal speech and language. No gross focal neurologic deficits are appreciated. No gait instability. Skin:  Skin is warm, dry and intact. No rash noted. Psychiatric: Mood and affect are normal. Speech and behavior are normal.  ____________________________________________   LABS (all labs ordered are listed, but only abnormal  results are displayed)  Labs Reviewed - No data to display ____________________________________________  EKG   ____________________________________________  RADIOLOGY  ED MD interpretation: No acute findings on chest x-ray.  Official radiology report(s): Dg Chest 2 View  Result Date: 10/03/2017 CLINICAL DATA:  Productive cough. EXAM: CHEST - 2 VIEW COMPARISON:  07/14/2016 FINDINGS: Right jugular Port-A-Cath terminates over the lower SVC. The cardiomediastinal silhouette is within normal limits. Mild scarring is noted in the right middle lobe. No acute airspace consolidation, edema, pleural effusion, or pneumothorax is identified. Right upper quadrant abdominal surgical clips are noted. No acute osseous abnormality is seen. IMPRESSION: No active cardiopulmonary disease. Electronically Signed   By: Logan Bores M.D.   On: 10/03/2017 12:22    ____________________________________________   PROCEDURES  Procedure(s) performed: None  Procedures  Critical Care performed: No  ____________________________________________  INITIAL IMPRESSION / ASSESSMENT AND PLAN / ED COURSE  As part of my medical decision making, I reviewed the following data within the electronic MEDICAL RECORD NUMBER    Cough and wheezing secondary bronchospasms.  Discussed negative chest x-ray findings with patient.  Patient responded well to to nebulized treatments.  Patient given discharge care instructions and a prescription for Tussionex to take at night before sleeping.  Patient also given prescription for [facility].  Patient advised follow-up PCP if no improvement in 3-4 days.      ____________________________________________   FINAL CLINICAL IMPRESSION(S) / ED DIAGNOSES  Final diagnoses:  Cough due to bronchospasm     ED Discharge Orders        Ordered    benzonatate (TESSALON PERLES) 100 MG capsule  3 times daily PRN     10/03/17 1452    chlorpheniramine-HYDROcodone (TUSSIONEX PENNKINETIC  ER) 10-8 MG/5ML SUER  At bedtime PRN     10/03/17 1452       Note:  This document was prepared using Dragon voice recognition software and may include unintentional dictation errors.    Sable Feil, PA-C 10/03/17 1521    Harvest Dark, MD 10/03/17 [phone]

## 2017-10-03 NOTE — ED Notes (Signed)
See triage note  Presents with family for a 1 week hx of cough and increased diff breathing  Has been using inhalers and svn treatments at home with min relief  No fever but has had prod cough

## 2017-10-03 NOTE — ED Triage Notes (Signed)
Pt reports with all the pollen she has a respiratory infection and her asthma has been flaring up. Pt reports coughing up green sputum.

## 2017-10-05 ENCOUNTER — Emergency Department: Payer: Medicare Other

## 2017-10-05 ENCOUNTER — Inpatient Hospital Stay
Admission: EM | Admit: 2017-10-05 | Discharge: 2017-10-08 | DRG: 194 | Disposition: A | Discharge status: Home / Self Care | Payer: Medicare Other | Attending: Family Medicine | Admitting: Family Medicine

## 2017-10-05 ENCOUNTER — Other Ambulatory Visit: Payer: Self-pay

## 2017-10-05 ENCOUNTER — Encounter: Payer: Self-pay | Admitting: Emergency Medicine

## 2017-10-05 DIAGNOSIS — F419 Anxiety disorder, unspecified: Secondary | ICD-10-CM | POA: Diagnosis present

## 2017-10-05 DIAGNOSIS — Z809 Family history of malignant neoplasm, unspecified: Secondary | ICD-10-CM | POA: Diagnosis not present

## 2017-10-05 DIAGNOSIS — R0602 Shortness of breath: Secondary | ICD-10-CM

## 2017-10-05 DIAGNOSIS — F329 Major depressive disorder, single episode, unspecified: Secondary | ICD-10-CM | POA: Diagnosis present

## 2017-10-05 DIAGNOSIS — Z880 Allergy status to penicillin: Secondary | ICD-10-CM | POA: Diagnosis not present

## 2017-10-05 DIAGNOSIS — Z888 Allergy status to other drugs, medicaments and biological substances status: Secondary | ICD-10-CM

## 2017-10-05 DIAGNOSIS — Z882 Allergy status to sulfonamides status: Secondary | ICD-10-CM

## 2017-10-05 DIAGNOSIS — I10 Essential (primary) hypertension: Secondary | ICD-10-CM | POA: Diagnosis present

## 2017-10-05 DIAGNOSIS — D638 Anemia in other chronic diseases classified elsewhere: Secondary | ICD-10-CM | POA: Diagnosis present

## 2017-10-05 DIAGNOSIS — J45901 Unspecified asthma with (acute) exacerbation: Secondary | ICD-10-CM | POA: Diagnosis present

## 2017-10-05 DIAGNOSIS — Z7901 Long term (current) use of anticoagulants: Secondary | ICD-10-CM

## 2017-10-05 DIAGNOSIS — J181 Lobar pneumonia, unspecified organism: Secondary | ICD-10-CM | POA: Diagnosis present

## 2017-10-05 DIAGNOSIS — Z825 Family history of asthma and other chronic lower respiratory diseases: Secondary | ICD-10-CM | POA: Diagnosis not present

## 2017-10-05 DIAGNOSIS — C8590 Non-Hodgkin lymphoma, unspecified, unspecified site: Secondary | ICD-10-CM

## 2017-10-05 DIAGNOSIS — Z8572 Personal history of non-Hodgkin lymphomas: Secondary | ICD-10-CM

## 2017-10-05 DIAGNOSIS — Z86711 Personal history of pulmonary embolism: Secondary | ICD-10-CM | POA: Diagnosis not present

## 2017-10-05 DIAGNOSIS — J189 Pneumonia, unspecified organism: Secondary | ICD-10-CM

## 2017-10-05 DIAGNOSIS — J40 Bronchitis, not specified as acute or chronic: Secondary | ICD-10-CM | POA: Diagnosis present

## 2017-10-05 LAB — COMPREHENSIVE METABOLIC PANEL
ALT: 18 U/L (ref 14–54)
AST: 32 U/L (ref 15–41)
Albumin: 3.8 g/dL (ref 3.5–5.0)
Alkaline Phosphatase: 125 U/L (ref 38–126)
Anion gap: 7 (ref 5–15)
BUN: 16 mg/dL (ref 6–20)
CO2: 25 mmol/L (ref 22–32)
Calcium: 8.1 mg/dL — ABNORMAL LOW (ref 8.9–10.3)
Chloride: 106 mmol/L (ref 101–111)
Creatinine, Ser: 1.39 mg/dL — ABNORMAL HIGH (ref 0.44–1.00)
GFR calc Af Amer: 43 mL/min — ABNORMAL LOW (ref >60)
GFR calc non Af Amer: 37 mL/min — ABNORMAL LOW (ref >60)
Glucose, Bld: 140 mg/dL — ABNORMAL HIGH (ref 65–99)
Potassium: 3.8 mmol/L (ref 3.5–5.1)
Sodium: 138 mmol/L (ref 135–145)
Total Bilirubin: 0.7 mg/dL (ref 0.3–1.2)
Total Protein: 7.4 g/dL (ref 6.5–8.1)

## 2017-10-05 LAB — CBC WITH DIFFERENTIAL/PLATELET
Basophils Absolute: 0 10*3/uL (ref 0–0.1)
Basophils Relative: 0 %
Eosinophils Absolute: 0.1 10*3/uL (ref 0–0.7)
Eosinophils Relative: 1 %
HCT: 34.9 % — ABNORMAL LOW (ref 35.0–47.0)
Hemoglobin: 11.7 g/dL — ABNORMAL LOW (ref 12.0–16.0)
Lymphocytes Relative: 12 %
Lymphs Abs: 0.8 10*3/uL — ABNORMAL LOW (ref 1.0–3.6)
MCH: 31.9 pg (ref 26.0–34.0)
MCHC: 33.6 g/dL (ref 32.0–36.0)
MCV: 94.8 fL (ref 80.0–100.0)
Monocytes Absolute: 0.5 10*3/uL (ref 0.2–0.9)
Monocytes Relative: 7 %
Neutro Abs: 5.2 10*3/uL (ref 1.4–6.5)
Neutrophils Relative %: 80 %
Platelets: 144 10*3/uL — ABNORMAL LOW (ref 150–440)
RBC: 3.68 MIL/uL — ABNORMAL LOW (ref 3.80–5.20)
RDW: 13.3 % (ref 11.5–14.5)
WBC: 6.6 10*3/uL (ref 3.6–11.0)

## 2017-10-05 LAB — PROCALCITONIN: Procalcitonin: <0.1 ng/mL

## 2017-10-05 LAB — MRSA PCR SCREENING: MRSA by PCR: NEGATIVE

## 2017-10-05 LAB — PROTIME-INR
INR: 0.97
Prothrombin Time: 12.8 seconds (ref 11.4–15.2)

## 2017-10-05 LAB — TROPONIN I: Troponin I: <0.03 ng/mL (ref <0.03)

## 2017-10-05 MED ORDER — LEVOFLOXACIN IN D5W 750 MG/150ML IV SOLN
750.0000 mg | INTRAVENOUS | Status: DC
  Administered 2017-10-07: 750 mg via INTRAVENOUS
  Filled 2017-10-05: qty 150

## 2017-10-05 MED ORDER — LEVOFLOXACIN IN D5W 750 MG/150ML IV SOLN
750.0000 mg | Freq: Once | INTRAVENOUS | Status: AC
  Administered 2017-10-05: 750 mg via INTRAVENOUS
  Filled 2017-10-05: qty 150

## 2017-10-05 MED ORDER — ACETAMINOPHEN 325 MG PO TABS
650.0000 mg | ORAL_TABLET | Freq: Four times a day (QID) | ORAL | Status: DC | PRN
  Administered 2017-10-06 – 2017-10-07 (×4): 650 mg via ORAL
  Filled 2017-10-05 (×4): qty 2

## 2017-10-05 MED ORDER — ONDANSETRON HCL 4 MG PO TABS: 4.0000 mg | ORAL_TABLET | Freq: Four times a day (QID) | ORAL | Status: DC | PRN

## 2017-10-05 MED ORDER — ALENDRONATE SODIUM 70 MG PO TABS: 70.0000 mg | ORAL_TABLET | ORAL | Status: DC

## 2017-10-05 MED ORDER — WARFARIN SODIUM 1 MG PO TABS
1.0000 mg | ORAL_TABLET | Freq: Every day | ORAL | Status: DC
  Administered 2017-10-05 – 2017-10-07 (×2): 1 mg via ORAL
  Filled 2017-10-05 (×2): qty 1

## 2017-10-05 MED ORDER — FAMOTIDINE 20 MG PO TABS
20.0000 mg | ORAL_TABLET | Freq: Two times a day (BID) | ORAL | Status: DC
  Administered 2017-10-05 – 2017-10-08 (×6): 20 mg via ORAL
  Filled 2017-10-05 (×6): qty 1

## 2017-10-05 MED ORDER — ENOXAPARIN SODIUM 40 MG/0.4ML ~~LOC~~ SOLN
40.0000 mg | SUBCUTANEOUS | Status: DC
  Administered 2017-10-05 – 2017-10-07 (×3): 40 mg via SUBCUTANEOUS
  Filled 2017-10-05 (×3): qty 0.4

## 2017-10-05 MED ORDER — CITALOPRAM HYDROBROMIDE 20 MG PO TABS
10.0000 mg | ORAL_TABLET | Freq: Every day | ORAL | Status: DC
  Administered 2017-10-05 – 2017-10-07 (×3): 10 mg via ORAL
  Filled 2017-10-05 (×3): qty 1

## 2017-10-05 MED ORDER — ONDANSETRON HCL 4 MG/2ML IJ SOLN: 4.0000 mg | Freq: Four times a day (QID) | INTRAMUSCULAR | Status: DC | PRN

## 2017-10-05 MED ORDER — POLYETHYLENE GLYCOL 3350 17 G PO PACK
17.0000 g | PACK | Freq: Every day | ORAL | Status: DC | PRN
  Administered 2017-10-07: 17 g via ORAL
  Filled 2017-10-05: qty 1

## 2017-10-05 MED ORDER — GABAPENTIN 300 MG PO CAPS
300.0000 mg | ORAL_CAPSULE | Freq: Three times a day (TID) | ORAL | Status: DC
  Administered 2017-10-05 – 2017-10-08 (×9): 300 mg via ORAL
  Filled 2017-10-05 (×9): qty 1

## 2017-10-05 MED ORDER — MIRTAZAPINE 15 MG PO TABS
15.0000 mg | ORAL_TABLET | Freq: Every day | ORAL | Status: DC
  Administered 2017-10-05 – 2017-10-07 (×3): 15 mg via ORAL
  Filled 2017-10-05 (×3): qty 1

## 2017-10-05 MED ORDER — TIZANIDINE HCL 2 MG PO TABS
2.0000 mg | ORAL_TABLET | Freq: Every day | ORAL | Status: DC
  Administered 2017-10-05 – 2017-10-07 (×3): 2 mg via ORAL
  Filled 2017-10-05 (×4): qty 1

## 2017-10-05 MED ORDER — ENTECAVIR 0.5 MG PO TABS
0.5000 mg | ORAL_TABLET | Freq: Two times a day (BID) | ORAL | Status: DC
  Filled 2017-10-05: qty 1

## 2017-10-05 MED ORDER — IPRATROPIUM-ALBUTEROL 0.5-2.5 (3) MG/3ML IN SOLN
9.0000 mL | Freq: Once | RESPIRATORY_TRACT | Status: AC
  Administered 2017-10-05: 9 mL via RESPIRATORY_TRACT
  Filled 2017-10-05: qty 9

## 2017-10-05 MED ORDER — HYDROCOD POLST-CPM POLST ER 10-8 MG/5ML PO SUER
5.0000 mL | Freq: Two times a day (BID) | ORAL | Status: DC
  Administered 2017-10-05 – 2017-10-08 (×6): 5 mL via ORAL
  Filled 2017-10-05 (×6): qty 5

## 2017-10-05 MED ORDER — METHYLPREDNISOLONE SODIUM SUCC 125 MG IJ SOLR
60.0000 mg | Freq: Four times a day (QID) | INTRAMUSCULAR | Status: DC
  Administered 2017-10-05 – 2017-10-07 (×8): 60 mg via INTRAVENOUS
  Filled 2017-10-05 (×8): qty 2

## 2017-10-05 MED ORDER — WARFARIN - PHYSICIAN DOSING INPATIENT
Freq: Every day | Status: DC
  Administered 2017-10-06: 05:00:00
  Filled 2017-10-05: qty 1

## 2017-10-05 MED ORDER — VANCOMYCIN HCL IN DEXTROSE 1-5 GM/200ML-% IV SOLN
1000.0000 mg | Freq: Once | INTRAVENOUS | Status: AC
  Administered 2017-10-05: 1000 mg via INTRAVENOUS
  Filled 2017-10-05: qty 200

## 2017-10-05 MED ORDER — IPRATROPIUM-ALBUTEROL 0.5-2.5 (3) MG/3ML IN SOLN
3.0000 mL | RESPIRATORY_TRACT | Status: DC
  Administered 2017-10-05 – 2017-10-07 (×11): 3 mL via RESPIRATORY_TRACT
  Filled 2017-10-05 (×13): qty 3

## 2017-10-05 MED ORDER — FENTANYL CITRATE (PF) 100 MCG/2ML IJ SOLN
50.0000 ug | Freq: Once | INTRAMUSCULAR | Status: AC
  Administered 2017-10-05: 50 ug via INTRAVENOUS
  Filled 2017-10-05: qty 2

## 2017-10-05 MED ORDER — LORATADINE 10 MG PO TABS
10.0000 mg | ORAL_TABLET | Freq: Every day | ORAL | Status: DC
  Administered 2017-10-05 – 2017-10-08 (×4): 10 mg via ORAL
  Filled 2017-10-05 (×4): qty 1

## 2017-10-05 MED ORDER — VITAMIN B-12 1000 MCG PO TABS
1000.0000 ug | ORAL_TABLET | Freq: Every day | ORAL | Status: DC
  Administered 2017-10-05 – 2017-10-08 (×4): 1000 ug via ORAL
  Filled 2017-10-05 (×4): qty 1

## 2017-10-05 MED ORDER — METHYLPREDNISOLONE SODIUM SUCC 125 MG IJ SOLR
125.0000 mg | Freq: Once | INTRAMUSCULAR | Status: AC
  Administered 2017-10-05: 125 mg via INTRAVENOUS
  Filled 2017-10-05: qty 2

## 2017-10-05 MED ORDER — GUAIFENESIN ER 600 MG PO TB12
600.0000 mg | ORAL_TABLET | Freq: Two times a day (BID) | ORAL | Status: DC
  Administered 2017-10-05 – 2017-10-08 (×6): 600 mg via ORAL
  Filled 2017-10-05 (×6): qty 1

## 2017-10-05 MED ORDER — BENZONATATE 100 MG PO CAPS
200.0000 mg | ORAL_CAPSULE | Freq: Three times a day (TID) | ORAL | Status: DC
  Administered 2017-10-05 – 2017-10-08 (×9): 200 mg via ORAL
  Filled 2017-10-05 (×9): qty 2

## 2017-10-05 MED ORDER — DOCUSATE SODIUM 100 MG PO CAPS
100.0000 mg | ORAL_CAPSULE | Freq: Two times a day (BID) | ORAL | Status: DC
  Administered 2017-10-05 – 2017-10-08 (×6): 100 mg via ORAL
  Filled 2017-10-05 (×6): qty 1

## 2017-10-05 MED ORDER — CALCIUM CARBONATE-VITAMIN D 500-200 MG-UNIT PO TABS
1.0000 | ORAL_TABLET | Freq: Every day | ORAL | Status: DC
  Administered 2017-10-06 – 2017-10-08 (×3): 1 via ORAL
  Filled 2017-10-05 (×3): qty 1

## 2017-10-05 MED ORDER — ACETAMINOPHEN 650 MG RE SUPP: 650.0000 mg | Freq: Four times a day (QID) | RECTAL | Status: DC | PRN

## 2017-10-05 MED ORDER — ALBUTEROL SULFATE (2.5 MG/3ML) 0.083% IN NEBU
7.5000 mg | INHALATION_SOLUTION | Freq: Once | RESPIRATORY_TRACT | Status: AC
  Administered 2017-10-05: 7.5 mg via RESPIRATORY_TRACT
  Filled 2017-10-05: qty 9

## 2017-10-05 MED ORDER — ADULT MULTIVITAMIN W/MINERALS CH
ORAL_TABLET | Freq: Every day | ORAL | Status: DC
  Administered 2017-10-05 – 2017-10-08 (×4): 1 via ORAL
  Filled 2017-10-05 (×4): qty 1

## 2017-10-05 MED ORDER — ENTECAVIR 0.5 MG PO TABS: 0.5000 mg | ORAL_TABLET | Freq: Two times a day (BID) | ORAL | Status: DC

## 2017-10-05 NOTE — H&P (Signed)
[place] [facility] [place] NAME: Erin [name]    MR#:  [id number]  DATE OF BIRTH:  01-05-1946  DATE OF ADMISSION:  10/05/2017  PRIMARY CARE PHYSICIAN: Creola Corn, DO   REQUESTING/REFERRING PHYSICIAN: Dr. Larae Grooms  CHIEF COMPLAINT:   Chief Complaint  Patient presents with  . Cough  . Asthma    HISTORY OF PRESENT ILLNESS:  Erin [name]  is a 72 y.o. female with a known history of asthma, not in any home oxygen, history of lymphoma currently in remission, hypertension, anxiety presents to the hospital from home secondary to worsening cough, congestion and shortness of breath. Patient explains that her symptoms started about 2-3 weeks ago.  She has not gotten any over-the-counter medications or prescription medications from her PCP.  Initially started as upper airway symptoms with cough and congestion.  Gradually worsened to very productive cough and shortness of breath.  She also was nauseous and threw up this morning.  Her son was off today and brought her to the emergency room since she was not getting any better.  Denies any fevers but had chills.  Also body aches.  I am consistent with asthma exacerbation associated with right middle lobe pneumonia on chest x-ray.  PAST MEDICAL HISTORY:   Past Medical History:  Diagnosis Date  . Acute URI   . Allergy   . Anxiety   . Asthma   . Cancer ([place])    lymphoma in remission  . Hepatitis B    treated  . Hypertension     PAST SURGICAL HISTORY:   Past Surgical History:  Procedure Laterality Date  . CHOLECYSTECTOMY      SOCIAL HISTORY:   Social History   Tobacco Use  . Smoking status: Never Smoker  . Smokeless tobacco: Never Used  Substance Use Topics  . Alcohol use: No    FAMILY HISTORY:   Family History  Problem Relation Age of Onset  . Cancer Daughter   . Asthma Mother   . Breast cancer Neg Hx     DRUG ALLERGIES:   Allergies  Allergen Reactions  .  Penicillins Rash    Has patient had a PCN reaction causing immediate rash, facial/tongue/throat swelling, SOB or lightheadedness with hypotension: Yes Has patient had a PCN reaction causing severe rash involving mucus membranes or skin necrosis: No Has patient had a PCN reaction that required hospitalization: No Has patient had a PCN reaction occurring within the last 10 years: No If all of the above answers are "NO", then may proceed with Cephalosporin use.  . Prednisone Hives  . Sulfa Antibiotics Hives and Shortness Of Breath    REVIEW OF SYSTEMS:   Review of Systems  Constitutional: Positive for malaise/fatigue. Negative for chills, fever and weight loss.  HENT: Negative for ear discharge, ear pain, hearing loss and nosebleeds.   Eyes: Negative for blurred vision, double vision and photophobia.  Respiratory: Positive for cough and shortness of breath. Negative for hemoptysis and wheezing.   Cardiovascular: Negative for chest pain, palpitations, orthopnea and leg swelling.  Gastrointestinal: Positive for nausea and vomiting. Negative for abdominal pain, constipation, diarrhea, heartburn and melena.  Genitourinary: Negative for dysuria, frequency and urgency.  Musculoskeletal: Positive for myalgias. Negative for back pain and neck pain.  Skin: Negative for rash.  Neurological: Negative for dizziness, tingling, tremors, sensory change, speech change, focal weakness and headaches.  Endo/Heme/Allergies: Does not bruise/bleed easily.  Psychiatric/Behavioral: Negative for depression.    MEDICATIONS  AT HOME:   Prior to Admission medications   Medication Sig Start Date End Date Taking? Authorizing Provider  albuterol (PROVENTIL HFA;VENTOLIN HFA) 108 (90 Base) MCG/ACT inhaler Inhale 1-2 puffs into the lungs every 6 (six) hours as needed for wheezing or shortness of breath. 07/14/16  Yes Hagler, Jami L, PA-C  alendronate (FOSAMAX) 70 MG tablet Take 70 mg by mouth once a week.  03/26/15  Yes  [provider]  benzonatate (TESSALON PERLES) 100 MG capsule Take 2 capsules (200 mg total) by mouth 3 (three) times daily as needed. 10/03/17 10/03/18 Yes Sable Feil, PA-C  calcium-vitamin D (CALCIUM 500/D) 500-200 MG-UNIT per tablet Take 1 tablet by mouth daily with breakfast.    Yes [provider]  cetirizine (ZYRTEC) 10 MG tablet TAKE 1 TABLET BY MOUTH DAILY FOR ALLERGIES 12/04/14  Yes [provider]  chlorpheniramine-HYDROcodone (TUSSIONEX PENNKINETIC ER) 10-8 MG/5ML SUER Take 5 mLs by mouth at bedtime as needed for cough. 10/03/17  Yes Sable Feil, PA-C  citalopram (CELEXA) 10 MG tablet Take 1 tablet by mouth daily. 09/27/17  Yes [provider]  Cyanocobalamin (VITAMIN B-12 CR) 1000 MCG TBCR Take 1 tablet by mouth daily.    Yes [provider]  entecavir (BARACLUDE) 0.5 MG tablet Take 0.5 mg by mouth 2 (two) times daily.  08/06/14  Yes [provider]  gabapentin (NEURONTIN) 300 MG capsule Take 300 mg by mouth 3 (three) times daily.  08/25/09  Yes [provider]  ipratropium (ATROVENT HFA) 17 MCG/ACT inhaler Inhale 2 puffs into the lungs every 6 (six) hours as needed.    Yes [provider]  ipratropium-albuterol (DUONEB) 0.5-2.5 (3) MG/3ML SOLN Take 3 mLs by nebulization every 6 (six) hours as needed. 05/27/16  Yes Earleen Newport, MD  mirtazapine (REMERON) 15 MG tablet Take 15 mg by mouth at bedtime.    Yes [provider]  Multiple Vitamin (MULTI-VITAMINS) TABS Take 1 tablet by mouth daily.    Yes [provider]  ranitidine (ZANTAC) 300 MG capsule Take 300 mg by mouth 2 (two) times daily.    Yes [provider]  tiZANidine (ZANAFLEX) 2 MG tablet Take 2 mg by mouth at bedtime.  07/07/13  Yes [provider]  warfarin (COUMADIN) 1 MG tablet TAKE ONE TABLET BY MOUTH EVERY DAY AT 6PM 06/29/17  Yes Cammie Sickle, MD      VITAL SIGNS:  Blood pressure 103/71, pulse (!) 109,  temperature 98.6 F (37 C), temperature source Oral, resp. rate 17, height 5\' 3"  (1.6 m), weight 83.9 kg (185 lb), SpO2 94 %.  PHYSICAL EXAMINATION:   Physical Exam  GENERAL:  72 y.o.-year-old patient lying in the bed with no acute distress.  EYES: Pupils equal, round, reactive to light and accommodation. No scleral icterus. Extraocular muscles intact.  HEENT: Head atraumatic, normocephalic. Oropharynx and nasopharynx clear.  NECK:  Supple, no jugular venous distention. No thyroid enlargement, no tenderness.  LUNGS: scant breath sounds bilaterally, with scattered expiratory wheezing,no rales,rhonchi or crepitation. No use of accessory muscles of respiration.  CARDIOVASCULAR: S1, S2 normal. No murmurs, rubs, or gallops.  ABDOMEN: Soft, nontender, nondistended. Bowel sounds present. No organomegaly or mass.  EXTREMITIES: No pedal edema, cyanosis, or clubbing.  NEUROLOGIC: Cranial nerves II through XII are intact. Muscle strength 5/5 in all extremities. Sensation intact. Gait not checked.  PSYCHIATRIC: The patient is alert and oriented x 3.  SKIN: No obvious rash, lesion, or ulcer.   LABORATORY PANEL:  CBC Recent Labs  Lab 10/05/17 0944  WBC 6.6  HGB 11.7*  HCT 34.9*  PLT 144*   ------------------------------------------------------------------------------------------------------------------  Chemistries  Recent Labs  Lab 10/05/17 0944  NA 138  K 3.8  CL 106  CO2 25  GLUCOSE 140*  BUN 16  CREATININE 1.39*  CALCIUM 8.1*  AST 32  ALT 18  ALKPHOS 125  BILITOT 0.7   ------------------------------------------------------------------------------------------------------------------  Cardiac Enzymes Recent Labs  Lab 10/05/17 0944  TROPONINI <0.03   ------------------------------------------------------------------------------------------------------------------  RADIOLOGY:  Dg Chest 2 View  Result Date: 10/05/2017 CLINICAL DATA:  URI symptoms, body aches, shortness  of breath, pleuritic chest pain unrelieved with antibiotics over the past 2 weeks. History of asthma and bronchitis and previous episodes of pneumonia. EXAM: CHEST - 2 VIEW COMPARISON:  Chest x-ray of October 03, 2017 FINDINGS: The lungs are adequately inflated. There are coarse lung markings in the lower retrosternal region. These likely lie on the left. There is no pleural effusion. The heart and pulmonary vascularity are normal. The porta catheter tip projects over the midportion of the SVC. There is calcification in the wall of the aortic arch. The bony thorax exhibits no acute abnormality. IMPRESSION: Subsegmental atelectasis or developing infiltrate in the right middle lobe anteriorly. Followup PA and lateral chest X-ray is recommended in 3-4 weeks following trial of antibiotic therapy to ensure resolution and exclude underlying malignancy. Electronically Signed   By: David  Martinique M.D.   On: 10/05/2017 10:35    EKG:   Orders placed or performed during the hospital encounter of 10/05/17  . ED EKG  . ED EKG    IMPRESSION AND PLAN:   Erin [name]  is a 72 y.o. female with a known history of asthma, not in any home oxygen, history of lymphoma currently in remission, hypertension, anxiety presents to the hospital from home secondary to worsening cough, congestion and shortness of breath.  1.  Acute asthma exacerbation with bronchitis/right middle lobe pneumonia-currently not requiring supplemental oxygen -Admit, IV steroids, nebs and inhalers -PRN oxygen -Cough medications -Started on Levaquin  2.  History of lymphoma-currently in remission.  Outpatient follow-up as recommended  3.  Patient takes warfarin to keep her port flushed.  Based on recent oncology notes, she is supposed to take only 1 mg/day.  Her INR is low anyway. -Confirm with oncology about the dosage  4.  History of anemia of chronic disease-hemoglobin is stable.  Monitor for now  5.  Depression and anxiety-continue home  medications  6.  DVT prophylaxis-started on Lovenox since INR is subtherapeutic  Son updated at bedside    All the records are reviewed and case discussed with ED provider. Management plans discussed with the patient, family and they are in agreement.  CODE STATUS: Full Code  TOTAL TIME TAKING CARE OF THIS PATIENT: 50 minutes.    Gladstone Lighter M.D on 10/05/2017 at 1:41 PM  Between 7am to 6pm - Pager - [phone]  After 6pm go to [email or web address] - password [email or web address] [facility]  Office  [phone]  CC: Primary care physician; White, Gomer J, DO

## 2017-10-05 NOTE — ED Provider Notes (Signed)
[facility] Emergency Department Provider Note  ___________________________________________   First MD Initiated Contact with Patient 10/05/17 [phone]     (approximate)  I have reviewed the triage vital signs and the nursing notes.   HISTORY  Chief Complaint Cough and Asthma   HPI Erin [name] is a 72 y.o. female with a history of lymphoma in remission as well as asthma and seasonal allergies who is presenting to the emergency department today with cough productive of green sputum as well as chills last night.  She was seen in the emergency department 2 days ago and discharged with cough suppressants.  However, since then she says that she is worsened with progressive shortness of breath and worsening of her cough.  She is also describing midsternal, cramping chest pain that worsens with coughing.  Has also a history of a PE on Coumadin.  Says that she has been taking albuterol nebulizers at home, 2 treatments last night and one this morning without relief.  Past Medical History:  Diagnosis Date  . Acute URI   . Allergy   . Anxiety   . Asthma   . Cancer ([place])    lymphoma  . Hepatitis B   . Hypertension     Patient Active Problem List   Diagnosis Date Noted  . 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 Disease of liver 12/18/2014  . Acute URI 11/07/2014  . BP (high blood pressure) 05/09/2014  . Chronic hepatitis B virus infection ([place]) 03/27/2011  . Lymphoma ([place]) 10/27/2007    History reviewed. No pertinent surgical history.  Prior to Admission medications   Medication Sig Start Date End Date Taking? Authorizing Provider  albuterol (PROVENTIL HFA;VENTOLIN HFA) 108 (90 Base) MCG/ACT inhaler Inhale 1-2 puffs into the lungs every 6 (six) hours as needed for wheezing or shortness of breath. 07/14/16   Hagler, Jami L, PA-C  alendronate (FOSAMAX) 70 MG tablet Take 70 mg by mouth once a week.  03/26/15    [provider]  benzonatate (TESSALON PERLES) 100 MG capsule Take 2 capsules (200 mg total) by mouth 3 (three) times daily as needed. 10/03/17 10/03/18  Sable Feil, PA-C  calcium-vitamin D (CALCIUM 500/D) 500-200 MG-UNIT per tablet Take 1 tablet by mouth daily with breakfast.     [provider]  cetirizine (ZYRTEC) 10 MG tablet TAKE 1 TABLET BY MOUTH DAILY FOR ALLERGIES 12/04/14   [provider]  chlorpheniramine-HYDROcodone (TUSSIONEX PENNKINETIC ER) 10-8 MG/5ML SUER Take 5 mLs by mouth at bedtime as needed for cough. 10/03/17   Sable Feil, PA-C  Cyanocobalamin (VITAMIN B-12 CR) 1000 MCG TBCR Take by mouth.    [provider]  entecavir (BARACLUDE) 0.5 MG tablet Take 0.5 mg by mouth 2 (two) times daily.  08/06/14   [provider]  gabapentin (NEURONTIN) 300 MG capsule Take 300 mg by mouth 3 (three) times daily.  08/25/09   [provider]  ibuprofen (ADVIL,MOTRIN) 800 MG tablet 1 tablet orally  prn as needed 04/17/10   [provider]  ipratropium (ATROVENT HFA) 17 MCG/ACT inhaler Inhale into the lungs.    [provider]  ipratropium-albuterol (DUONEB) 0.5-2.5 (3) MG/3ML SOLN Take 3 mLs by nebulization every 6 (six) hours as needed. Patient not taking: Reported on 09/23/2017 05/27/16   Earleen Newport, MD  LORazepam (ATIVAN) 0.5 MG tablet Take 0.5 mg by mouth every 8 (eight) hours as needed for anxiety.  [provider]  mirtazapine (REMERON) 15 MG tablet Take 15 mg by mouth at bedtime.     [provider]  Multiple Vitamin (MULTI-VITAMINS) TABS Take 1 tablet by mouth daily.     [provider]  PARoxetine (PAXIL) 10 MG tablet Take 10 mg by mouth daily.  08/07/15   [provider]  ranitidine (ZANTAC) 300 MG capsule Take by mouth.    [provider]  tiZANidine (ZANAFLEX) 2 MG tablet Take 2 mg by mouth at bedtime.  07/07/13   [provider]  warfarin (COUMADIN) 1  MG tablet TAKE ONE TABLET BY MOUTH EVERY DAY AT [facility] 06/29/17   Cammie Sickle, MD    Allergies Penicillins; Prednisone; and Sulfa antibiotics  Family History  Problem Relation Age of Onset  . Cancer Daughter   . Breast cancer Neg Hx     Social History Social History   Tobacco Use  . Smoking status: Never Smoker  . Smokeless tobacco: Never Used  Substance Use Topics  . Alcohol use: No  . Drug use: Not on file    Review of Systems  Constitutional: No fever/chills Eyes: No visual changes. ENT: No sore throat. Cardiovascular: As above Respiratory: As above Gastrointestinal: No abdominal pain.  No nausea, no vomiting.  No diarrhea.  No constipation. Genitourinary: Negative for dysuria. Musculoskeletal: Negative for back pain. Skin: Negative for rash. Neurological: Negative for headaches, focal weakness or numbness.   ____________________________________________   PHYSICAL EXAM:  VITAL SIGNS: ED Triage Vitals  Enc Vitals Group     BP 10/05/17 0925 115/73     Pulse Rate 10/05/17 0925 (!) 121     Resp 10/05/17 0925 (!) 22     Temp 10/05/17 0925 98.6 F (37 C)     Temp Source 10/05/17 0925 Oral     SpO2 10/05/17 0925 95 %     Weight 10/05/17 0926 185 lb (83.9 kg)     Height 10/05/17 0926 5\' 3"  (1.6 m)     Head Circumference --      Peak Flow --      Pain Score 10/05/17 0926 8     Pain Loc --      Pain Edu? --      Excl. in [place]? --     Constitutional: Alert and oriented. Well appearing and in no acute distress. Eyes: Conjunctivae are normal.  Head: Atraumatic. Nose: No congestion/rhinnorhea. Mouth/Throat: Mucous membranes are moist.  Neck: No stridor.   Cardiovascular: Tachycardic, regular rhythm. Grossly normal heart sounds.  Good peripheral circulation. Respiratory: Tachypneic.  Speaking in full sentences.  No retractions.  However, the patient is a prolonged expiratory phase with coarse wheezing throughout all fields.  Expiratory cough multiple times  throughout exam. Gastrointestinal: Soft and nontender. No distention. Musculoskeletal: No lower extremity tenderness nor edema.  No joint effusions. Neurologic:  Normal speech and language. No gross focal neurologic deficits are appreciated. Skin:  Skin is warm, dry and intact. No rash noted. Psychiatric: Mood and affect are normal. Speech and behavior are normal.  ____________________________________________   LABS (all labs ordered are listed, but only abnormal results are displayed)  Labs Reviewed  CBC WITH DIFFERENTIAL/PLATELET - Abnormal; Notable for the following components:      Result Value   RBC 3.68 (*)    Hemoglobin 11.7 (*)    HCT 34.9 (*)    Platelets 144 (*)    Lymphs Abs 0.8 (*)    All other components within normal limits  COMPREHENSIVE METABOLIC PANEL  TROPONIN I   ____________________________________________  EKG  ED ECG REPORT I, Doran Stabler, the attending physician, personally viewed and interpreted this ECG.   Date: 10/05/2017  EKG Time: 0936  Rate: 111  Rhythm: sinus tachycardia  Axis: Normal  Intervals:none  ST&T Change: No ST segment elevation or depression.  No abnormal T wave inversion.  ____________________________________________  RADIOLOGY  Right middle lobe atelectasis versus infiltrate. ____________________________________________   PROCEDURES  Procedure(s) performed:   Procedures  Critical Care performed:   ____________________________________________   INITIAL IMPRESSION / ASSESSMENT AND PLAN / ED COURSE  Pertinent labs & imaging results that were available during my care of the patient were reviewed by me and considered in my medical decision making (see chart for details).  Differential includes, but is not limited to, viral syndrome, bronchitis including COPD exacerbation, pneumonia, reactive airway disease including asthma, CHF including exacerbation with or without pulmonary/interstitial edema, pneumothorax,  ACS, thoracic trauma, and pulmonary embolism. As part of my medical decision making, I reviewed the following data within the electronic MEDICAL RECORD NUMBER Notes from prior ED visits  ----------------------------------------- 11:27 AM on 10/05/2017 -----------------------------------------  Patient persistently wheezing despite 3 duo nebs.  Will give an additional course of albuterol patient will be admitted to the hospital.  Signed out to Dr. Tressia Miners.  The patient is understanding of the plan and willing to comply. ____________________________________________   FINAL CLINICAL IMPRESSION(S) / ED DIAGNOSES  Community-acquired pneumonia.  Asthma exacerbation.    NEW MEDICATIONS STARTED DURING THIS VISIT:  New Prescriptions   No medications on file     Note:  This document was prepared using Dragon voice recognition software and may include unintentional dictation errors.     Orbie Pyo, MD 10/05/17 [phone]

## 2017-10-05 NOTE — ED Notes (Signed)
Pt transported to xray 

## 2017-10-05 NOTE — ED Triage Notes (Signed)
Here with c/o cough, congestion for the past 2 weeks now, has been treated with antibiotic with no relief. Coughing in triage, pt pale.

## 2017-10-05 NOTE — Progress Notes (Signed)

## 2017-10-05 NOTE — Progress Notes (Signed)
Pharmacy Antibiotic Note  Erin [name] is a 72 y.o. female admitted on 10/05/2017 with pneumonia/CAP. Pharmacy has been consulted for levofloxacin dosing.  Plan: Levofloxacin 750mg  IV q48h (CrCl 38.23ml/min)   Height: 5\' 3"  (160 cm) Weight: 185 lb (83.9 kg) IBW/kg (Calculated) : 52.4  Temp (24hrs), Avg:98.6 F (37 C), Min:98.6 F (37 C), Max:98.6 F (37 C)  Recent Labs  Lab 10/05/17 0944  WBC 6.6  CREATININE 1.39*    Estimated Creatinine Clearance: 38.6 mL/min (A) (by C-G formula based on SCr of 1.39 mg/dL (H)).    Allergies  Allergen Reactions  . Penicillins Rash    Has patient had a PCN reaction causing immediate rash, facial/tongue/throat swelling, SOB or lightheadedness with hypotension: Yes Has patient had a PCN reaction causing severe rash involving mucus membranes or skin necrosis: No Has patient had a PCN reaction that required hospitalization: No Has patient had a PCN reaction occurring within the last 10 years: No If all of the above answers are "NO", then may proceed with Cephalosporin use.  . Prednisone Hives  . Sulfa Antibiotics Hives and Shortness Of Breath    Antimicrobials this admission: 4/10 Vancomycin >> Once 4/10 Levofloxacin >>   Dose adjustments this admission:   Microbiology results: 4/10 MRSA PCR: Negative  Thank you for allowing pharmacy to be a part of this patient's care.  Candelaria Stagers, PharmD Pharmacy Resident  10/05/2017 12:18 PM

## 2017-10-06 LAB — BASIC METABOLIC PANEL
Anion gap: 8 (ref 5–15)
BUN: 16 mg/dL (ref 6–20)
CO2: 23 mmol/L (ref 22–32)
Calcium: 8.2 mg/dL — ABNORMAL LOW (ref 8.9–10.3)
Chloride: 108 mmol/L (ref 101–111)
Creatinine, Ser: 1.24 mg/dL — ABNORMAL HIGH (ref 0.44–1.00)
GFR calc Af Amer: 50 mL/min — ABNORMAL LOW (ref >60)
GFR calc non Af Amer: 43 mL/min — ABNORMAL LOW (ref >60)
Glucose, Bld: 226 mg/dL — ABNORMAL HIGH (ref 65–99)
Potassium: 2.6 mmol/L — CL (ref 3.5–5.1)
Sodium: 139 mmol/L (ref 135–145)

## 2017-10-06 LAB — CBC
HCT: 32.1 % — ABNORMAL LOW (ref 35.0–47.0)
Hemoglobin: 11.1 g/dL — ABNORMAL LOW (ref 12.0–16.0)
MCH: 32.6 pg (ref 26.0–34.0)
MCHC: 34.4 g/dL (ref 32.0–36.0)
MCV: 94.7 fL (ref 80.0–100.0)
Platelets: 149 10*3/uL — ABNORMAL LOW (ref 150–440)
RBC: 3.39 MIL/uL — ABNORMAL LOW (ref 3.80–5.20)
RDW: 13.6 % (ref 11.5–14.5)
WBC: 5.1 10*3/uL (ref 3.6–11.0)

## 2017-10-06 LAB — MAGNESIUM: Magnesium: 2.5 mg/dL — ABNORMAL HIGH (ref 1.7–2.4)

## 2017-10-06 LAB — GLUCOSE, CAPILLARY
Glucose-Capillary: 289 mg/dL — ABNORMAL HIGH (ref 65–99)
Glucose-Capillary: 185 mg/dL — ABNORMAL HIGH (ref 65–99)

## 2017-10-06 LAB — INFLUENZA PANEL BY PCR (TYPE A & B)
Influenza A By PCR: NEGATIVE
Influenza B By PCR: NEGATIVE

## 2017-10-06 MED ORDER — POTASSIUM CHLORIDE CRYS ER 20 MEQ PO TBCR
60.0000 meq | EXTENDED_RELEASE_TABLET | Freq: Once | ORAL | Status: AC
  Administered 2017-10-06: 60 meq via ORAL
  Filled 2017-10-06: qty 3

## 2017-10-06 MED ORDER — MAGNESIUM SULFATE 2 GM/50ML IV SOLN
2.0000 g | Freq: Once | INTRAVENOUS | Status: AC
  Administered 2017-10-06: 2 g via INTRAVENOUS
  Filled 2017-10-06: qty 50

## 2017-10-06 MED ORDER — INSULIN ASPART 100 UNIT/ML ~~LOC~~ SOLN
0.0000 [IU] | Freq: Three times a day (TID) | SUBCUTANEOUS | Status: DC
  Administered 2017-10-06: 5 [IU] via SUBCUTANEOUS
  Administered 2017-10-07: 2 [IU] via SUBCUTANEOUS
  Administered 2017-10-07: 3 [IU] via SUBCUTANEOUS
  Administered 2017-10-08: 1 [IU] via SUBCUTANEOUS
  Filled 2017-10-06 (×4): qty 1

## 2017-10-06 MED ORDER — SODIUM CHLORIDE 0.9 % IV SOLN
2.0000 g | Freq: Three times a day (TID) | INTRAVENOUS | Status: DC
  Filled 2017-10-06 (×2): qty 2

## 2017-10-06 MED ORDER — SODIUM CHLORIDE 0.9 % IV SOLN
INTRAVENOUS | Status: DC
  Administered 2017-10-06: 11:00:00 via INTRAVENOUS

## 2017-10-06 MED ORDER — ALPRAZOLAM 0.5 MG PO TABS
0.5000 mg | ORAL_TABLET | Freq: Two times a day (BID) | ORAL | Status: DC | PRN
  Administered 2017-10-06 – 2017-10-07 (×2): 0.5 mg via ORAL
  Filled 2017-10-06 (×2): qty 1

## 2017-10-06 NOTE — Progress Notes (Signed)
MD Informed of critical potassium 2.6. Verbal orders placed per MD

## 2017-10-06 NOTE — Progress Notes (Addendum)
[place] at [place] NAME: Erin [name]    MR#:  [id number]  DATE OF BIRTH:  01/20/1946  SUBJECTIVE:  CHIEF COMPLAINT:  Sob better but coughing a lot  REVIEW OF SYSTEMS:  CONSTITUTIONAL: No fever, fatigue or weakness.  EYES: No blurred or double vision.  EARS, NOSE, AND THROAT: No tinnitus or ear pain.  RESPIRATORY: Reports  cough, shortness of breath, no wheezing or hemoptysis.  CARDIOVASCULAR: No chest pain, orthopnea, edema.  GASTROINTESTINAL: No nausea, vomiting, diarrhea or abdominal pain.  GENITOURINARY: No dysuria, hematuria.  ENDOCRINE: No polyuria, nocturia,  HEMATOLOGY: No anemia, easy bruising or bleeding SKIN: No rash or lesion. MUSCULOSKELETAL: No joint pain or arthritis.   NEUROLOGIC: No tingling, numbness, weakness.  PSYCHIATRY: No anxiety or depression.   DRUG ALLERGIES:   Allergies  Allergen Reactions  . Penicillins Rash    Has patient had a PCN reaction causing immediate rash, facial/tongue/throat swelling, SOB or lightheadedness with hypotension: Yes Has patient had a PCN reaction causing severe rash involving mucus membranes or skin necrosis: No Has patient had a PCN reaction that required hospitalization: No Has patient had a PCN reaction occurring within the last 10 years: No If all of the above answers are "NO", then may proceed with Cephalosporin use.  . Prednisone Hives  . Sulfa Antibiotics Hives and Shortness Of Breath    VITALS:  Blood pressure (!) 89/57, pulse 99, temperature 97.7 F (36.5 C), temperature source Oral, resp. rate 18, height 5\' 3"  (1.6 m), weight 82.9 kg (182 lb 12.2 oz), SpO2 98 %.  PHYSICAL EXAMINATION:  GENERAL:  72 y.o.-year-old patient lying in the bed with no acute distress.  EYES: Pupils equal, round, reactive to light and accommodation. No scleral icterus. Extraocular muscles intact.  HEENT: Head atraumatic, normocephalic. Oropharynx and nasopharynx clear.  NECK:   Supple, no jugular venous distention. No thyroid enlargement, no tenderness.  LUNGS: Patient with a diminished breath sounds bilaterally, no wheezing, rales,rhonchi or crepitation. No use of accessory muscles of respiration.  CARDIOVASCULAR: S1, S2 normal. No murmurs, rubs, or gallops.  Right anterior chest wall with Port-A-Cath ABDOMEN: Soft, nontender, nondistended. Bowel sounds present.  EXTREMITIES: No pedal edema, cyanosis, or clubbing.  NEUROLOGIC: Cranial nerves II through XII are intact. Sensation intact. Gait not checked.  PSYCHIATRIC: The patient is alert and oriented x 3.  SKIN: No obvious rash, lesion, or ulcer.    LABORATORY PANEL:   CBC Recent Labs  Lab 10/06/17 0530  WBC 5.1  HGB 11.1*  HCT 32.1*  PLT 149*   ------------------------------------------------------------------------------------------------------------------  Chemistries  Recent Labs  Lab 10/05/17 0944 10/06/17 0530  NA 138 139  K 3.8 2.6*  CL 106 108  CO2 25 23  GLUCOSE 140* 226*  BUN 16 16  CREATININE 1.39* 1.24*  CALCIUM 8.1* 8.2*  MG  --  2.5*  AST 32  --   ALT 18  --   ALKPHOS 125  --   BILITOT 0.7  --    ------------------------------------------------------------------------------------------------------------------  Cardiac Enzymes Recent Labs  Lab 10/05/17 0944  TROPONINI <0.03   ------------------------------------------------------------------------------------------------------------------  RADIOLOGY:  Dg Chest 2 View  Result Date: 10/05/2017 CLINICAL DATA:  URI symptoms, body aches, shortness of breath, pleuritic chest pain unrelieved with antibiotics over the past 2 weeks. History of asthma and bronchitis and previous episodes of pneumonia. EXAM: CHEST - 2 VIEW COMPARISON:  Chest x-ray of October 03, 2017 FINDINGS: The lungs are adequately inflated. There are coarse lung  markings in the lower retrosternal region. These likely lie on the left. There is no pleural effusion.  The heart and pulmonary vascularity are normal. The porta catheter tip projects over the midportion of the SVC. There is calcification in the wall of the aortic arch. The bony thorax exhibits no acute abnormality. IMPRESSION: Subsegmental atelectasis or developing infiltrate in the right middle lobe anteriorly. Followup PA and lateral chest X-ray is recommended in 3-4 weeks following trial of antibiotic therapy to ensure resolution and exclude underlying malignancy. Electronically Signed   By: David  Martinique M.D.   On: 10/05/2017 10:35    EKG:   Orders placed or performed during the hospital encounter of 10/05/17  . ED EKG  . ED EKG    ASSESSMENT AND PLAN:     Erin [name]  is a 72 y.o. female with a known history of asthma, not in any home oxygen, history of lymphoma currently in remission, hypertension, anxiety presents to the hospital from home secondary to worsening cough, congestion and shortness of breath.  1.  Acute asthma exacerbation with bronchitis/right middle lobe pneumonia-currently not requiring supplemental oxygen -1 dose of IV magnesium -Continue IV steroids, nebs and inhalers -PRN oxygen -Cough medications, supportive treatment -on Levaquin  2.    History of large B cell lymphoma-currently in remission.  Outpatient follow-up as recommended  3.  Patient takes warfarin to keep her port flushed.  Based on recent oncology notes, she is supposed to take only 1 mg/day.    Will continue the same and does not need to monitor PT/INR on regular basis yes D/W  Dr. Richardean Chimera  4.  History of anemia of chronic disease-hemoglobin is stable.  Monitor for now  5.  Depression and anxiety-continue home medications  6.  DVT prophylaxis-started on Lovenox since INR is subtherapeutic      All the records are reviewed and case discussed with Care Management/Social Workerr. Management plans discussed with the patient, family and they are in agreement.  CODE STATUS:  fc  TOTAL TIME TAKING CARE OF THIS PATIENT: 35  minutes.   POSSIBLE D/C IN 1-2  DAYS, DEPENDING ON CLINICAL CONDITION.  Note: This dictation was prepared with Dragon dictation along with smaller phrase technology. Any transcriptional errors that result from this process are unintentional.   Nicholes Mango M.D on 10/06/2017 at 10:42 AM  Between 7am to 6pm - Pager - [phone] After 6pm go to [email or web address] - password [email or web address] [facility]  [place] [facility]  Office  [phone]  CC: Primary care physician; White, [place] J, DO

## 2017-10-07 ENCOUNTER — Inpatient Hospital Stay: Payer: Medicare Other

## 2017-10-07 LAB — BASIC METABOLIC PANEL
Anion gap: 5 (ref 5–15)
BUN: 22 mg/dL — ABNORMAL HIGH (ref 6–20)
CO2: 23 mmol/L (ref 22–32)
Calcium: 8.1 mg/dL — ABNORMAL LOW (ref 8.9–10.3)
Chloride: 109 mmol/L (ref 101–111)
Creatinine, Ser: 1.12 mg/dL — ABNORMAL HIGH (ref 0.44–1.00)
GFR calc Af Amer: 56 mL/min — ABNORMAL LOW (ref >60)
GFR calc non Af Amer: 49 mL/min — ABNORMAL LOW (ref >60)
Glucose, Bld: 192 mg/dL — ABNORMAL HIGH (ref 65–99)
Potassium: 3.5 mmol/L (ref 3.5–5.1)
Sodium: 137 mmol/L (ref 135–145)

## 2017-10-07 LAB — GLUCOSE, CAPILLARY
Glucose-Capillary: 158 mg/dL — ABNORMAL HIGH (ref 65–99)
Glucose-Capillary: 241 mg/dL — ABNORMAL HIGH (ref 65–99)
Glucose-Capillary: 103 mg/dL — ABNORMAL HIGH (ref 65–99)
Glucose-Capillary: 213 mg/dL — ABNORMAL HIGH (ref 65–99)

## 2017-10-07 MED ORDER — CLONAZEPAM 1 MG PO TABS
1.0000 mg | ORAL_TABLET | Freq: Two times a day (BID) | ORAL | Status: DC
  Administered 2017-10-07 – 2017-10-08 (×2): 1 mg via ORAL
  Filled 2017-10-07 (×2): qty 1

## 2017-10-07 MED ORDER — LEVOFLOXACIN 750 MG PO TABS: 750.0000 mg | ORAL_TABLET | Freq: Every day | ORAL | Status: DC

## 2017-10-07 MED ORDER — IPRATROPIUM-ALBUTEROL 0.5-2.5 (3) MG/3ML IN SOLN
3.0000 mL | Freq: Four times a day (QID) | RESPIRATORY_TRACT | Status: DC
  Administered 2017-10-07 – 2017-10-08 (×3): 3 mL via RESPIRATORY_TRACT
  Filled 2017-10-07 (×5): qty 3

## 2017-10-07 MED ORDER — METOPROLOL TARTRATE 25 MG PO TABS
12.5000 mg | ORAL_TABLET | Freq: Two times a day (BID) | ORAL | Status: DC
  Administered 2017-10-07 – 2017-10-08 (×3): 12.5 mg via ORAL
  Filled 2017-10-07 (×3): qty 1

## 2017-10-07 MED ORDER — TEMAZEPAM 15 MG PO CAPS
15.0000 mg | ORAL_CAPSULE | Freq: Every day | ORAL | Status: DC
  Administered 2017-10-07: 15 mg via ORAL
  Filled 2017-10-07: qty 1

## 2017-10-07 MED ORDER — LEVOFLOXACIN 750 MG PO TABS
750.0000 mg | ORAL_TABLET | Freq: Every day | ORAL | Status: DC
  Administered 2017-10-08: 750 mg via ORAL
  Filled 2017-10-07 (×2): qty 1

## 2017-10-07 MED ORDER — DEXAMETHASONE 4 MG PO TABS
4.0000 mg | ORAL_TABLET | Freq: Three times a day (TID) | ORAL | Status: DC
  Administered 2017-10-07 – 2017-10-08 (×3): 4 mg via ORAL
  Filled 2017-10-07 (×4): qty 1

## 2017-10-07 MED ORDER — CITALOPRAM HYDROBROMIDE 20 MG PO TABS
20.0000 mg | ORAL_TABLET | Freq: Every day | ORAL | Status: DC
  Administered 2017-10-08: 20 mg via ORAL
  Filled 2017-10-07: qty 1

## 2017-10-07 MED ORDER — SIMETHICONE 80 MG PO CHEW
80.0000 mg | CHEWABLE_TABLET | Freq: Four times a day (QID) | ORAL | Status: DC | PRN
  Administered 2017-10-07: 80 mg via ORAL
  Filled 2017-10-07 (×3): qty 1

## 2017-10-07 NOTE — Progress Notes (Signed)
Inpatient Diabetes Program Recommendations  AACE/ADA: New Consensus Statement on Inpatient Glycemic Control (2015)  Target Ranges:  Prepandial:   less than 140 mg/dL      Peak postprandial:   less than 180 mg/dL (1-2 hours)      Critically ill patients:  140 - 180 mg/dL   Lab Results  Component Value Date   GLUCAP 241 (H) 10/07/2017    Review of Glycemic ControlResults for Erin [name], Erin [name] (MRN [id number]) as of 10/07/2017 13:16  Ref. Range 10/06/2017 21:19 10/07/2017 07:59 10/07/2017 11:45  Glucose-Capillary Latest Ref Range: 65 - 99 mg/dL 185 (H) 158 (H) 241 (H)    Diabetes history:None Outpatient Diabetes medications: None Current orders for Inpatient glycemic control:  Novolog sensitive tid with meals and HS Solumedrol 60 mg IV q 6 hours Inpatient Diabetes Program Recommendations:    May consider increasing Novolog correction to moderate tid with meals and HS.   Thanks,  Adah Perl, RN, BC-ADM Inpatient Diabetes Coordinator Pager [phone] (8a-5p)

## 2017-10-07 NOTE — Progress Notes (Signed)
[place] at [place] NAME: Erin [name]    MR#:  [id number]  DATE OF BIRTH:  1946/01/25  SUBJECTIVE:  CHIEF COMPLAINT:   Chief Complaint  Patient presents with  . Cough  . Asthma  numersouse compliants, appears stable/no distress, noted anxiety  REVIEW OF SYSTEMS:  CONSTITUTIONAL: No fever, fatigue or weakness.  EYES: No blurred or double vision.  EARS, NOSE, AND THROAT: No tinnitus or ear pain.  RESPIRATORY: No cough, shortness of breath, wheezing or hemoptysis.  CARDIOVASCULAR: No chest pain, orthopnea, edema.  GASTROINTESTINAL: No nausea, vomiting, diarrhea or abdominal pain.  GENITOURINARY: No dysuria, hematuria.  ENDOCRINE: No polyuria, nocturia,  HEMATOLOGY: No anemia, easy bruising or bleeding SKIN: No rash or lesion. MUSCULOSKELETAL: No joint pain or arthritis.   NEUROLOGIC: No tingling, numbness, weakness.  PSYCHIATRY: No anxiety or depression.   ROS  DRUG ALLERGIES:   Allergies  Allergen Reactions  . Penicillins Rash    Has patient had a PCN reaction causing immediate rash, facial/tongue/throat swelling, SOB or lightheadedness with hypotension: Yes Has patient had a PCN reaction causing severe rash involving mucus membranes or skin necrosis: No Has patient had a PCN reaction that required hospitalization: No Has patient had a PCN reaction occurring within the last 10 years: No If all of the above answers are "NO", then may proceed with Cephalosporin use.  . Prednisone Hives  . Sulfa Antibiotics Hives and Shortness Of Breath    VITALS:  Blood pressure 112/66, pulse 92, temperature 97.8 F (36.6 C), resp. rate 18, height 5\' 3"  (1.6 m), weight 82.9 kg (182 lb 12.2 oz), SpO2 95 %.  PHYSICAL EXAMINATION:  GENERAL:  72 y.o.-year-old patient lying in the bed with no acute distress.  EYES: Pupils equal, round, reactive to light and accommodation. No scleral icterus. Extraocular muscles intact.  HEENT: Head atraumatic,  normocephalic. Oropharynx and nasopharynx clear.  NECK:  Supple, no jugular venous distention. No thyroid enlargement, no tenderness.  LUNGS: Normal breath sounds bilaterally, no wheezing, rales,rhonchi or crepitation. No use of accessory muscles of respiration.  CARDIOVASCULAR: S1, S2 normal. No murmurs, rubs, or gallops.  ABDOMEN: Soft, nontender, nondistended. Bowel sounds present. No organomegaly or mass.  EXTREMITIES: No pedal edema, cyanosis, or clubbing.  NEUROLOGIC: Cranial nerves II through XII are intact. Muscle strength 5/5 in all extremities. Sensation intact. Gait not checked.  PSYCHIATRIC: The patient is alert and oriented x 3.  SKIN: No obvious rash, lesion, or ulcer.   Physical Exam LABORATORY PANEL:   CBC Recent Labs  Lab 10/06/17 0530  WBC 5.1  HGB 11.1*  HCT 32.1*  PLT 149*   ------------------------------------------------------------------------------------------------------------------  Chemistries  Recent Labs  Lab 10/05/17 0944 10/06/17 0530 10/07/17 1245  NA 138 139 137  K 3.8 2.6* 3.5  CL 106 108 109  CO2 25 23 23   GLUCOSE 140* 226* 192*  BUN 16 16 22*  CREATININE 1.39* 1.24* 1.12*  CALCIUM 8.1* 8.2* 8.1*  MG  --  2.5*  --   AST 32  --   --   ALT 18  --   --   ALKPHOS 125  --   --   BILITOT 0.7  --   --    ------------------------------------------------------------------------------------------------------------------  Cardiac Enzymes Recent Labs  Lab 10/05/17 0944  TROPONINI <0.03   ------------------------------------------------------------------------------------------------------------------  RADIOLOGY:  Dg Chest 2 View  Result Date: 10/07/2017 CLINICAL DATA:  Shortness of breath, cough EXAM: CHEST - 2 VIEW COMPARISON:  10/05/2017 FINDINGS: Right  Port-A-Cath remains in place, unchanged. Bibasilar atelectasis. Heart is normal size. Eventration of the posterior left hemidiaphragm, stable. No effusions or acute bony abnormality.  IMPRESSION: Bibasilar atelectasis. Electronically Signed   By: Rolm Baptise M.D.   On: 10/07/2017 11:07    ASSESSMENT AND PLAN:  Erin [name] a72 y.o.femalewith a known history of asthma, not in any home oxygen, history of lymphoma currently in remission, hypertension, anxiety presents to the hospital from home secondary to worsening cough, congestion and shortness of breath.  1.Acute asthma exacerbation  Resoving Transition to p.o. steroids, and O2 off as tolerated, Levaquin to p.o., and continue close medical monitoring   2.   History of large B cell lymphoma currently in remission Will need to f/u with oncology s/p discharge  3. Mediport Patient takes warfarin to keep her port flushed. Based on recent oncology notes, she is supposed to take only 1 mg/day.   Will continue the same and does not need to monitor PT/INR on regular basis yes D/W  Dr. Richardean Chimera  4. History of anemia of chronic disease Stable  5. Depression and anxiety Noted anxiety Change to Klonopin twice daily, Restoril at bedtime, discontinue Ativan, increase Celexa  6. DVT prophylaxis-started on Lovenox since INR is subtherapeutic   Disposition to home on tomorrow    All the records are reviewed and case discussed with Care Management/Social Workerr. Management plans discussed with the patient, family and they are in agreement.  CODE STATUS:full  TOTAL TIME TAKING CARE OF THIS PATIENT: 45 minutes.     POSSIBLE D/C IN 1 DAYS, DEPENDING ON CLINICAL CONDITION.   Avel Peace Mieke Brinley M.D on 10/07/2017   Between 7am to 6pm - Pager - [phone]  After 6pm go to [email or web address] - password [email or web address] [place] [facility]  Office  [phone]  CC: Primary care physician; White, Tamela Gammon, DO  Note: This dictation was prepared with Diplomatic Services operational officer dictation along with smaller phrase technology. Any transcriptional errors that result from this process are unintentional.

## 2017-10-08 LAB — GLUCOSE, CAPILLARY
Glucose-Capillary: 122 mg/dL — ABNORMAL HIGH (ref 65–99)
Glucose-Capillary: 104 mg/dL — ABNORMAL HIGH (ref 65–99)

## 2017-10-08 LAB — PROTIME-INR
INR: 0.87
Prothrombin Time: 11.8 seconds (ref 11.4–15.2)

## 2017-10-08 MED ORDER — LEVOFLOXACIN 750 MG PO TABS: 750.0000 mg | ORAL_TABLET | Freq: Every day | ORAL | 0 refills | Status: DC

## 2017-10-08 MED ORDER — CITALOPRAM HYDROBROMIDE 20 MG PO TABS: 20.0000 mg | ORAL_TABLET | Freq: Every day | ORAL | 0 refills | Status: DC

## 2017-10-08 MED ORDER — ALBUTEROL SULFATE HFA 108 (90 BASE) MCG/ACT IN AERS: 1.0000 | INHALATION_SPRAY | Freq: Four times a day (QID) | RESPIRATORY_TRACT | 0 refills | Status: AC | PRN

## 2017-10-08 MED ORDER — METOPROLOL TARTRATE 25 MG PO TABS: 12.5000 mg | ORAL_TABLET | Freq: Two times a day (BID) | ORAL | 0 refills | Status: DC

## 2017-10-08 MED ORDER — BUDESONIDE-FORMOTEROL FUMARATE 160-4.5 MCG/ACT IN AERO: 2.0000 | INHALATION_SPRAY | Freq: Two times a day (BID) | RESPIRATORY_TRACT | 0 refills | Status: DC

## 2017-10-08 MED ORDER — IPRATROPIUM BROMIDE HFA 17 MCG/ACT IN AERS: 2.0000 | INHALATION_SPRAY | Freq: Four times a day (QID) | RESPIRATORY_TRACT | 0 refills | Status: DC | PRN

## 2017-10-08 MED ORDER — GUAIFENESIN ER 600 MG PO TB12: 600.0000 mg | ORAL_TABLET | Freq: Two times a day (BID) | ORAL | 0 refills | Status: DC

## 2017-10-08 MED ORDER — IPRATROPIUM-ALBUTEROL 0.5-2.5 (3) MG/3ML IN SOLN: 3.0000 mL | Freq: Four times a day (QID) | RESPIRATORY_TRACT | 0 refills | Status: DC

## 2017-10-08 MED ORDER — BENZONATATE 200 MG PO CAPS: 200.0000 mg | ORAL_CAPSULE | Freq: Three times a day (TID) | ORAL | 0 refills | Status: DC

## 2017-10-08 MED ORDER — DEXAMETHASONE 4 MG PO TABS: 4.0000 mg | ORAL_TABLET | Freq: Three times a day (TID) | ORAL | 0 refills | Status: DC

## 2017-10-08 MED ORDER — TRAZODONE HCL 100 MG PO TABS: 100.0000 mg | ORAL_TABLET | Freq: Every day | ORAL | 0 refills | Status: DC

## 2017-10-08 NOTE — Discharge Summary (Signed)
[facility] [place] NAME: Erin [name]    MR#:  [id number]  DATE OF BIRTH:  [birth year]  DATE OF ADMISSION:  10/05/2017 ADMITTING PHYSICIAN: Gladstone Lighter, MD  DATE OF DISCHARGE: No discharge date for patient encounter.  PRIMARY CARE PHYSICIAN: Creola Corn, DO    ADMISSION DIAGNOSIS:  Community acquired pneumonia of right middle lobe of lung ([place]) [J18.1] Exacerbation of asthma, unspecified asthma severity, unspecified whether persistent [J45.901]  DISCHARGE DIAGNOSIS:  Active Problems:   Asthma exacerbation   SECONDARY DIAGNOSIS:   Past Medical History:  Diagnosis Date  . Acute URI   . Allergy   . Anxiety   . Asthma   . Cancer ([place])    lymphoma in remission  . Hepatitis B    treated  . Hypertension     HOSPITAL COURSE:  Erin [name] a72 y.o.femalewith a known history of asthma, not in any home oxygen, history of lymphoma currently in remission, hypertension, anxiety presents to the hospital from home secondary to worsening cough, congestion and shortness of breath.  1.Acute asthma exacerbation  Resoving Treated with IV Solu-Medrol-transition to p.o. steroids on day prior to discharge, O2 weaned off, Levaquin to p.o., and patient did well   2.History of large B celllymphoma currently in remission Will need to f/u with oncology s/p discharge  3. Mediport Patient takes warfarin to keep her port flushed. Based on recent oncology notes, she is supposed to take only 1 mg/day.Will continue the same and does not need to monitor PT/INR on regular basis yes D/WDr. Richardean Chimera  4. History of anemia of chronic disease Stable  5. Depression and anxiety Celexa increased to 20 mg daily, trazodone at bedtime for sleep, referred to primary care provider status post discharge for continued medical management   DISCHARGE CONDITIONS:  On the day of discharge patient is afebrile,  hemodynamic stable, tolerating diet, ready for discharge home, for more specific details please see chart  CONSULTS OBTAINED:    DRUG ALLERGIES:   Allergies  Allergen Reactions  . Penicillins Rash    Has patient had a PCN reaction causing immediate rash, facial/tongue/throat swelling, SOB or lightheadedness with hypotension: Yes Has patient had a PCN reaction causing severe rash involving mucus membranes or skin necrosis: No Has patient had a PCN reaction that required hospitalization: No Has patient had a PCN reaction occurring within the last 10 years: No If all of the above answers are "NO", then may proceed with Cephalosporin use.  . Prednisone Hives  . Sulfa Antibiotics Hives and Shortness Of Breath    DISCHARGE MEDICATIONS:   Allergies as of 10/08/2017      Reactions   Penicillins Rash   Has patient had a PCN reaction causing immediate rash, facial/tongue/throat swelling, SOB or lightheadedness with hypotension: Yes Has patient had a PCN reaction causing severe rash involving mucus membranes or skin necrosis: No Has patient had a PCN reaction that required hospitalization: No Has patient had a PCN reaction occurring within the last 10 years: No If all of the above answers are "NO", then may proceed with Cephalosporin use.   Prednisone Hives   Sulfa Antibiotics Hives, Shortness Of Breath      Medication List    TAKE these medications   albuterol 108 (90 Base) MCG/ACT inhaler Commonly known as:  PROVENTIL HFA;VENTOLIN HFA Inhale 1-2 puffs into the lungs every 6 (six) hours as needed for wheezing or shortness of breath.   alendronate 70 MG tablet Commonly  known as:  FOSAMAX Take 70 mg by mouth once a week.   BARACLUDE 0.5 MG tablet Generic drug:  entecavir Take 0.5 mg by mouth 2 (two) times daily.   benzonatate 200 MG capsule Commonly known as:  TESSALON Take 1 capsule (200 mg total) by mouth 3 (three) times daily. What changed:    medication strength  when to  take this  reasons to take this   budesonide-formoterol 160-4.5 MCG/ACT inhaler Commonly known as:  SYMBICORT Inhale 2 puffs into the lungs 2 (two) times daily.   CALCIUM 500/D 500-200 MG-UNIT tablet Generic drug:  calcium-vitamin D Take 1 tablet by mouth daily with breakfast.   cetirizine 10 MG tablet Commonly known as:  ZYRTEC TAKE 1 TABLET BY MOUTH DAILY FOR ALLERGIES   chlorpheniramine-HYDROcodone 10-8 MG/5ML Suer Commonly known as:  TUSSIONEX PENNKINETIC ER Take 5 mLs by mouth at bedtime as needed for cough.   citalopram 20 MG tablet Commonly known as:  CELEXA Take 1 tablet (20 mg total) by mouth daily. Start taking on:  10/09/2017 What changed:    medication strength  how much to take   dexamethasone 4 MG tablet Commonly known as:  DECADRON Take 1 tablet (4 mg total) by mouth every 8 (eight) hours.   gabapentin 300 MG capsule Commonly known as:  NEURONTIN Take 300 mg by mouth 3 (three) times daily.   guaiFENesin 600 MG 12 hr tablet Commonly known as:  MUCINEX Take 1 tablet (600 mg total) by mouth 2 (two) times daily.   ipratropium 17 MCG/ACT inhaler Commonly known as:  ATROVENT HFA Inhale 2 puffs into the lungs every 6 (six) hours as needed.   ipratropium-albuterol 0.5-2.5 (3) MG/3ML Soln Commonly known as:  DUONEB Take 3 mLs by nebulization every 6 (six) hours. What changed:    when to take this  reasons to take this   levofloxacin 750 MG tablet Commonly known as:  LEVAQUIN Take 1 tablet (750 mg total) by mouth daily. Start taking on:  10/09/2017   metoprolol tartrate 25 MG tablet Commonly known as:  LOPRESSOR Take 0.5 tablets (12.5 mg total) by mouth 2 (two) times daily.   mirtazapine 15 MG tablet Commonly known as:  REMERON Take 15 mg by mouth at bedtime.   MULTI-VITAMINS Tabs Take 1 tablet by mouth daily.   ranitidine 300 MG capsule Commonly known as:  ZANTAC Take 300 mg by mouth 2 (two) times daily.   tiZANidine 2 MG tablet Commonly  known as:  ZANAFLEX Take 2 mg by mouth at bedtime.   traZODone 100 MG tablet Commonly known as:  DESYREL Take 1 tablet (100 mg total) by mouth at bedtime.   Vitamin B-12 CR 1000 MCG Tbcr Take 1 tablet by mouth daily.   warfarin 1 MG tablet Commonly known as:  COUMADIN TAKE ONE TABLET BY MOUTH EVERY DAY AT 6PM        DISCHARGE INSTRUCTIONS:   If you experience worsening of your admission symptoms, develop shortness of breath, life threatening emergency, suicidal or homicidal thoughts you must seek medical attention immediately by calling 911 or calling your MD immediately  if symptoms less severe.  You Must read complete instructions/literature along with all the possible adverse reactions/side effects for all the Medicines you take and that have been prescribed to you. Take any new Medicines after you have completely understood and accept all the possible adverse reactions/side effects.   Please note  You were cared for by a hospitalist during your hospital stay.  If you have any questions about your discharge medications or the care you received while you were in the hospital after you are discharged, you can call the unit and asked to speak with the hospitalist on call if the hospitalist that took care of you is not available. Once you are discharged, your primary care physician will handle any further medical issues. Please note that NO REFILLS for any discharge medications will be authorized once you are discharged, as it is imperative that you return to your primary care physician (or establish a relationship with a primary care physician if you do not have one) for your aftercare needs so that they can reassess your need for medications and monitor your lab values.    Today   CHIEF COMPLAINT:   Chief Complaint  Patient presents with  . Cough  . Asthma    HISTORY OF PRESENT ILLNESS:  72 y.o. female with a known history of asthma, not in any home oxygen, history of  lymphoma currently in remission, hypertension, anxiety presents to the hospital from home secondary to worsening cough, congestion and shortness of breath. Patient explains that her symptoms started about 2-3 weeks ago.  She has not gotten any over-the-counter medications or prescription medications from her PCP.  Initially started as upper airway symptoms with cough and congestion.  Gradually worsened to very productive cough and shortness of breath.  She also was nauseous and threw up this morning.  Her son was off today and brought her to the emergency room since she was not getting any better.  Denies any fevers but had chills.  Also body aches.  I am consistent with asthma exacerbation associated with right middle lobe pneumonia on chest x-ray.  VITAL SIGNS:  Blood pressure 122/79, pulse (!) 110, temperature 97.6 F (36.4 C), temperature source Oral, resp. rate 18, height 5\' 3"  (1.6 m), weight 82.9 kg (182 lb 12.2 oz), SpO2 95 %.  I/O:    Intake/Output Summary (Last 24 hours) at 10/08/2017 1057 Last data filed at 10/08/2017 1054 Gross per 24 hour  Intake 1200 ml  Output 1300 ml  Net -100 ml    PHYSICAL EXAMINATION:  GENERAL:  72 y.o.-year-old patient lying in the bed with no acute distress.  EYES: Pupils equal, round, reactive to light and accommodation. No scleral icterus. Extraocular muscles intact.  HEENT: Head atraumatic, normocephalic. Oropharynx and nasopharynx clear.  NECK:  Supple, no jugular venous distention. No thyroid enlargement, no tenderness.  LUNGS: Normal breath sounds bilaterally, no wheezing, rales,rhonchi or crepitation. No use of accessory muscles of respiration.  CARDIOVASCULAR: S1, S2 normal. No murmurs, rubs, or gallops.  ABDOMEN: Soft, non-tender, non-distended. Bowel sounds present. No organomegaly or mass.  EXTREMITIES: No pedal edema, cyanosis, or clubbing.  NEUROLOGIC: Cranial nerves II through XII are intact. Muscle strength 5/5 in all extremities.  Sensation intact. Gait not checked.  PSYCHIATRIC: The patient is alert and oriented x 3.  SKIN: No obvious rash, lesion, or ulcer.   DATA REVIEW:   CBC Recent Labs  Lab 10/06/17 0530  WBC 5.1  HGB 11.1*  HCT 32.1*  PLT 149*    Chemistries  Recent Labs  Lab 10/05/17 0944 10/06/17 0530 10/07/17 1245  NA 138 139 137  K 3.8 2.6* 3.5  CL 106 108 109  CO2 25 23 23   GLUCOSE 140* 226* 192*  BUN 16 16 22*  CREATININE 1.39* 1.24* 1.12*  CALCIUM 8.1* 8.2* 8.1*  MG  --  2.5*  --  AST 32  --   --   ALT 18  --   --   ALKPHOS 125  --   --   BILITOT 0.7  --   --     Cardiac Enzymes Recent Labs  Lab 10/05/17 0944  TROPONINI <0.03    Microbiology Results  Results for orders placed or performed during the hospital encounter of 10/05/17  MRSA PCR Screening     Status: None   Collection Time: 10/05/17  2:15 PM  Result Value Ref Range Status   MRSA by PCR NEGATIVE NEGATIVE Final    Comment:        The GeneXpert MRSA Assay (FDA approved for NASAL specimens only), is one component of a comprehensive MRSA colonization surveillance program. It is not intended to diagnose MRSA infection nor to guide or monitor treatment for MRSA infections. Performed at [facility], [address] [place], [place] [address]     RADIOLOGY:  Dg Chest 2 View  Result Date: 10/07/2017 CLINICAL DATA:  Shortness of breath, cough EXAM: CHEST - 2 VIEW COMPARISON:  10/05/2017 FINDINGS: Right Port-A-Cath remains in place, unchanged. Bibasilar atelectasis. Heart is normal size. Eventration of the posterior left hemidiaphragm, stable. No effusions or acute bony abnormality. IMPRESSION: Bibasilar atelectasis. Electronically Signed   By: Rolm Baptise M.D.   On: 10/07/2017 11:07    EKG:   Orders placed or performed during the hospital encounter of 10/05/17  . ED EKG  . ED EKG      Management plans discussed with the patient, family and they are in agreement.  CODE STATUS:      Code Status Orders  (From admission, onward)        Start     Ordered   10/05/17 1652  Full code  Continuous     10/05/17 1651    Code Status History    This patient has a current code status but no historical code status.      TOTAL TIME TAKING CARE OF THIS PATIENT: 35 minutes.    Avel Peace Parnika Tweten M.D on 10/08/2017 at 10:57 AM  Between 7am to 6pm - Pager - [phone]  After 6pm go to [email or web address] - password [email or web address] [facility]  Office  [phone]  CC: Primary care physician; White, Tamela Gammon, DO   Note: This dictation was prepared with Diplomatic Services operational officer dictation along with smaller phrase technology. Any transcriptional errors that result from this process are unintentional.

## 2017-10-11 ENCOUNTER — Telehealth: Payer: Self-pay

## 2017-10-11 NOTE — Telephone Encounter (Signed)
Flagged for not having a follow up scheduled.  Called and spoke with patient.  She reported she has a follow up with [facility] on May 1st.  She mentioned she feels tired and having some swelling around her ankles.  I encouraged her to call Princella Ion if it does not improve.  She said she received great care while at [facility] and had no further questions or concerns at this time.  I thanked her for her feedback and informed her that she would receive one more automated call in the next few days checking on her.

## 2017-10-13 ENCOUNTER — Telehealth: Payer: Self-pay

## 2017-10-13 NOTE — Telephone Encounter (Deleted)
EMMI Follow-up: [name]

## 2017-11-25 ENCOUNTER — Inpatient Hospital Stay: Payer: Medicare Other | Attending: Internal Medicine

## 2017-11-25 DIAGNOSIS — Z452 Encounter for adjustment and management of vascular access device: Secondary | ICD-10-CM | POA: Insufficient documentation

## 2017-11-25 DIAGNOSIS — C8253 Diffuse follicle center lymphoma, intra-abdominal lymph nodes: Secondary | ICD-10-CM | POA: Insufficient documentation

## 2017-11-25 DIAGNOSIS — Z95828 Presence of other vascular implants and grafts: Secondary | ICD-10-CM

## 2017-11-25 MED ORDER — HEPARIN SOD (PORK) LOCK FLUSH 100 UNIT/ML IV SOLN
500.0000 [IU] | Freq: Once | INTRAVENOUS | Status: AC
  Administered 2017-11-25: 500 [IU] via INTRAVENOUS

## 2017-11-25 MED ORDER — SODIUM CHLORIDE 0.9% FLUSH
10.0000 mL | Freq: Once | INTRAVENOUS | Status: AC
  Administered 2017-11-25: 10 mL via INTRAVENOUS
  Filled 2017-11-25: qty 10

## 2018-01-20 ENCOUNTER — Inpatient Hospital Stay: Payer: Medicare Other | Attending: Internal Medicine

## 2018-01-20 DIAGNOSIS — C8253 Diffuse follicle center lymphoma, intra-abdominal lymph nodes: Secondary | ICD-10-CM | POA: Diagnosis present

## 2018-01-20 DIAGNOSIS — Z452 Encounter for adjustment and management of vascular access device: Secondary | ICD-10-CM | POA: Diagnosis not present

## 2018-01-20 DIAGNOSIS — Z95828 Presence of other vascular implants and grafts: Secondary | ICD-10-CM

## 2018-01-20 MED ORDER — HEPARIN SOD (PORK) LOCK FLUSH 100 UNIT/ML IV SOLN
500.0000 [IU] | Freq: Once | INTRAVENOUS | Status: AC
  Administered 2018-01-20: 500 [IU] via INTRAVENOUS

## 2018-01-20 MED ORDER — SODIUM CHLORIDE 0.9% FLUSH
10.0000 mL | Freq: Once | INTRAVENOUS | Status: AC
  Administered 2018-01-20: 10 mL via INTRAVENOUS
  Filled 2018-01-20: qty 10

## 2018-03-24 ENCOUNTER — Inpatient Hospital Stay: Payer: Medicare Other | Attending: Internal Medicine

## 2018-03-24 ENCOUNTER — Encounter: Payer: Self-pay | Admitting: Internal Medicine

## 2018-03-24 ENCOUNTER — Inpatient Hospital Stay (HOSPITAL_BASED_OUTPATIENT_CLINIC_OR_DEPARTMENT_OTHER): Payer: Medicare Other | Admitting: Internal Medicine

## 2018-03-24 ENCOUNTER — Other Ambulatory Visit: Payer: Self-pay

## 2018-03-24 VITALS — BP 109/76 | HR 82 | Temp 97.9°F | Resp 20 | Ht 63.0 in | Wt 183.0 lb

## 2018-03-24 DIAGNOSIS — C8258 Diffuse follicle center lymphoma, lymph nodes of multiple sites: Secondary | ICD-10-CM | POA: Insufficient documentation

## 2018-03-24 DIAGNOSIS — C8253 Diffuse follicle center lymphoma, intra-abdominal lymph nodes: Secondary | ICD-10-CM

## 2018-03-24 DIAGNOSIS — Z9221 Personal history of antineoplastic chemotherapy: Secondary | ICD-10-CM

## 2018-03-24 DIAGNOSIS — N183 Chronic kidney disease, stage 3 (moderate): Secondary | ICD-10-CM | POA: Insufficient documentation

## 2018-03-24 DIAGNOSIS — D631 Anemia in chronic kidney disease: Secondary | ICD-10-CM

## 2018-03-24 DIAGNOSIS — Z923 Personal history of irradiation: Secondary | ICD-10-CM

## 2018-03-24 LAB — CBC WITH DIFFERENTIAL/PLATELET
Basophils Absolute: 0 10*3/uL (ref 0–0.1)
Basophils Relative: 0 %
Eosinophils Absolute: 0.1 10*3/uL (ref 0–0.7)
Eosinophils Relative: 2 %
HCT: 37.2 % (ref 35.0–47.0)
Hemoglobin: 12.7 g/dL (ref 12.0–16.0)
Lymphocytes Relative: 30 %
Lymphs Abs: 1.5 10*3/uL (ref 1.0–3.6)
MCH: 32.2 pg (ref 26.0–34.0)
MCHC: 34.1 g/dL (ref 32.0–36.0)
MCV: 94.2 fL (ref 80.0–100.0)
Monocytes Absolute: 0.4 10*3/uL (ref 0.2–0.9)
Monocytes Relative: 9 %
Neutro Abs: 2.9 10*3/uL (ref 1.4–6.5)
Neutrophils Relative %: 59 %
Platelets: 193 10*3/uL (ref 150–440)
RBC: 3.94 MIL/uL (ref 3.80–5.20)
RDW: 13.5 % (ref 11.5–14.5)
WBC: 4.8 10*3/uL (ref 3.6–11.0)

## 2018-03-24 LAB — COMPREHENSIVE METABOLIC PANEL
ALT: 14 U/L (ref 0–44)
AST: 23 U/L (ref 15–41)
Albumin: 4 g/dL (ref 3.5–5.0)
Alkaline Phosphatase: 94 U/L (ref 38–126)
Anion gap: 7 (ref 5–15)
BUN: 17 mg/dL (ref 8–23)
CO2: 26 mmol/L (ref 22–32)
Calcium: 9.3 mg/dL (ref 8.9–10.3)
Chloride: 106 mmol/L (ref 98–111)
Creatinine, Ser: 1.38 mg/dL — ABNORMAL HIGH (ref 0.44–1.00)
GFR calc Af Amer: 44 mL/min — ABNORMAL LOW (ref >60)
GFR calc non Af Amer: 38 mL/min — ABNORMAL LOW (ref >60)
Glucose, Bld: 106 mg/dL — ABNORMAL HIGH (ref 70–99)
Potassium: 4.1 mmol/L (ref 3.5–5.1)
Sodium: 139 mmol/L (ref 135–145)
Total Bilirubin: 0.6 mg/dL (ref 0.3–1.2)
Total Protein: 7.3 g/dL (ref 6.5–8.1)

## 2018-03-24 LAB — LACTATE DEHYDROGENASE: LDH: 127 U/L (ref 98–192)

## 2018-03-24 MED ORDER — HEPARIN SOD (PORK) LOCK FLUSH 100 UNIT/ML IV SOLN
500.0000 [IU] | Freq: Once | INTRAVENOUS | Status: AC
  Administered 2018-03-24: 500 [IU] via INTRAVENOUS
  Filled 2018-03-24: qty 5

## 2018-03-24 MED ORDER — SODIUM CHLORIDE 0.9% FLUSH
10.0000 mL | Freq: Once | INTRAVENOUS | Status: AC
  Administered 2018-03-24: 10 mL via INTRAVENOUS
  Filled 2018-03-24: qty 10

## 2018-03-24 NOTE — Assessment & Plan Note (Addendum)
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  # Clinically no evidence of recurrence noted.  Stable.  # Port flushes- q 8 w/coumadin 1mg /day; [ patient was to keep it.].    # Mild anemia- Hb-12.sec to CKD/patient is on p.o. iron once a day.  Stable.  # CKD- III reatinine of 1.36;STABLE   # patient will follow-up with me in approximately 6 months/labs;  CC; PCP
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
## 2018-03-30 MED ORDER — VANCOMYCIN HCL IN DEXTROSE 750-5 MG/150ML-% IV SOLN
750.0000 mg | Freq: Once | INTRAVENOUS | Status: DC
  Filled 2018-03-30 (×2): qty 150

## 2018-03-30 MED ORDER — GENTAMICIN SULFATE 40 MG/ML IJ SOLN
80.0000 mg | Freq: Once | INTRAVENOUS | Status: DC
  Filled 2018-03-30: qty 2

## 2018-03-30 MED ORDER — GENTAMICIN IN SALINE 1.6-0.9 MG/ML-% IV SOLN
80.0000 mg | INTRAVENOUS | Status: AC
  Administered 2018-03-31: 80 mg via INTRAVENOUS
  Filled 2018-03-30 (×2): qty 50

## 2018-03-31 ENCOUNTER — Encounter
Admission: RE | Disposition: A | Discharge status: Home / Self Care | Payer: Self-pay | Source: Ambulatory Visit | Attending: Unknown Physician Specialty

## 2018-03-31 ENCOUNTER — Ambulatory Visit: Payer: Medicare Other | Admitting: Anesthesiology

## 2018-03-31 ENCOUNTER — Ambulatory Visit
Admission: RE | Admit: 2018-03-31 | Discharge: 2018-03-31 | Disposition: A | Discharge status: Home / Self Care | Payer: Medicare Other | Source: Ambulatory Visit | Attending: Unknown Physician Specialty | Admitting: Unknown Physician Specialty

## 2018-03-31 ENCOUNTER — Other Ambulatory Visit: Payer: Self-pay

## 2018-03-31 DIAGNOSIS — Z1211 Encounter for screening for malignant neoplasm of colon: Secondary | ICD-10-CM | POA: Diagnosis not present

## 2018-03-31 DIAGNOSIS — Z8572 Personal history of non-Hodgkin lymphomas: Secondary | ICD-10-CM | POA: Diagnosis not present

## 2018-03-31 DIAGNOSIS — Z7901 Long term (current) use of anticoagulants: Secondary | ICD-10-CM | POA: Diagnosis not present

## 2018-03-31 DIAGNOSIS — Z7983 Long term (current) use of bisphosphonates: Secondary | ICD-10-CM | POA: Insufficient documentation

## 2018-03-31 DIAGNOSIS — J45909 Unspecified asthma, uncomplicated: Secondary | ICD-10-CM | POA: Insufficient documentation

## 2018-03-31 DIAGNOSIS — K635 Polyp of colon: Secondary | ICD-10-CM | POA: Insufficient documentation

## 2018-03-31 DIAGNOSIS — Z7951 Long term (current) use of inhaled steroids: Secondary | ICD-10-CM | POA: Diagnosis not present

## 2018-03-31 DIAGNOSIS — Z86718 Personal history of other venous thrombosis and embolism: Secondary | ICD-10-CM | POA: Diagnosis not present

## 2018-03-31 DIAGNOSIS — Z79899 Other long term (current) drug therapy: Secondary | ICD-10-CM | POA: Insufficient documentation

## 2018-03-31 DIAGNOSIS — K219 Gastro-esophageal reflux disease without esophagitis: Secondary | ICD-10-CM | POA: Insufficient documentation

## 2018-03-31 DIAGNOSIS — K640 First degree hemorrhoids: Secondary | ICD-10-CM | POA: Diagnosis not present

## 2018-03-31 DIAGNOSIS — F419 Anxiety disorder, unspecified: Secondary | ICD-10-CM | POA: Diagnosis not present

## 2018-03-31 DIAGNOSIS — K5730 Diverticulosis of large intestine without perforation or abscess without bleeding: Secondary | ICD-10-CM | POA: Insufficient documentation

## 2018-03-31 DIAGNOSIS — I10 Essential (primary) hypertension: Secondary | ICD-10-CM | POA: Diagnosis not present

## 2018-03-31 HISTORY — DX: Diverticulosis of intestine, part unspecified, without perforation or abscess without bleeding: K57.90

## 2018-03-31 HISTORY — DX: Localized edema: R60.0

## 2018-03-31 HISTORY — DX: Gastric ulcer, unspecified as acute or chronic, without hemorrhage or perforation: K25.9

## 2018-03-31 HISTORY — DX: Non-Hodgkin lymphoma, unspecified, unspecified site: C85.90

## 2018-03-31 HISTORY — DX: Acute embolism and thrombosis of deep veins of unspecified upper extremity: I82.629

## 2018-03-31 HISTORY — PX: COLONOSCOPY WITH PROPOFOL: SHX5780

## 2018-03-31 SURGERY — COLONOSCOPY WITH PROPOFOL
Anesthesia: General

## 2018-03-31 MED ORDER — SODIUM CHLORIDE 0.9 % IV SOLN
INTRAVENOUS | Status: DC
  Administered 2018-03-31: 12:00:00 via INTRAVENOUS

## 2018-03-31 MED ORDER — PROPOFOL 500 MG/50ML IV EMUL
INTRAVENOUS | Status: AC
  Filled 2018-03-31: qty 50

## 2018-03-31 MED ORDER — PROPOFOL 10 MG/ML IV BOLUS
INTRAVENOUS | Status: DC | PRN
  Administered 2018-03-31 (×4): 10 mg via INTRAVENOUS

## 2018-03-31 MED ORDER — MIDAZOLAM HCL 2 MG/2ML IJ SOLN
INTRAMUSCULAR | Status: AC
  Filled 2018-03-31: qty 2

## 2018-03-31 MED ORDER — FENTANYL CITRATE (PF) 100 MCG/2ML IJ SOLN
INTRAMUSCULAR | Status: AC
  Filled 2018-03-31: qty 2

## 2018-03-31 MED ORDER — PROPOFOL 500 MG/50ML IV EMUL
INTRAVENOUS | Status: DC | PRN
  Administered 2018-03-31: 25 ug/kg/min via INTRAVENOUS

## 2018-03-31 MED ORDER — LIDOCAINE HCL (PF) 2 % IJ SOLN
INTRAMUSCULAR | Status: DC | PRN
  Administered 2018-03-31: 60 mg

## 2018-03-31 MED ORDER — SODIUM CHLORIDE 0.9 % IV SOLN: INTRAVENOUS | Status: DC

## 2018-03-31 MED ORDER — LIDOCAINE HCL (PF) 2 % IJ SOLN
INTRAMUSCULAR | Status: AC
  Filled 2018-03-31: qty 10

## 2018-03-31 MED ORDER — MIDAZOLAM HCL 5 MG/5ML IJ SOLN
INTRAMUSCULAR | Status: DC | PRN
  Administered 2018-03-31: 2 mg via INTRAVENOUS

## 2018-03-31 MED ORDER — FENTANYL CITRATE (PF) 100 MCG/2ML IJ SOLN
INTRAMUSCULAR | Status: DC | PRN
  Administered 2018-03-31 (×4): 25 ug via INTRAVENOUS

## 2018-03-31 NOTE — Transfer of Care (Signed)
Immediate Anesthesia Transfer of Care Note  Patient: Erin [name]  Procedure(s) Performed: COLONOSCOPY WITH PROPOFOL (N/A )  Patient Location: PACU  Anesthesia Type:General  Level of Consciousness: sedated  Airway & Oxygen Therapy: Patient Spontanous Breathing and Patient connected to nasal cannula oxygen  Post-op Assessment: Report given to RN and Post -op Vital signs reviewed and stable  Post vital signs: Reviewed and stable  Last Vitals:  Vitals Value Taken Time  BP    Temp    Pulse    Resp    SpO2      Last Pain:  Vitals:   03/31/18 1139  TempSrc: Tympanic         Complications: No apparent anesthesia complications

## 2018-03-31 NOTE — H&P (Signed)
Primary Care Physician:  Creola Corn, DO Primary Gastroenterologist:  Dr. Vira Agar  Pre-Procedure History & Physical: HPI:  EDDY [name] is a 72 y.o. female is here for a screening  Colonoscopy.  Last one was 10 years ago, no polyps found.   Past Medical History:  Diagnosis Date  . Acute URI   . Allergy   . Anxiety   . Asthma   . Cancer ([place])    lymphoma in remission  . Diverticulosis   . DVT of upper extremity (deep vein thrombosis) ([place]) 07/2008 & 12/2007  . Gastric ulcer 10/2007   EGD  . Hepatitis B    treated  . Hypertension   . Lower extremity edema   . Lymphoma ([place])    Stage 3 large B cell lymphoma    Past Surgical History:  Procedure Laterality Date  . CHOLECYSTECTOMY    . COLONOSCOPY  11/23/2007, 05/12/1994  . ESOPHAGOGASTRODUODENOSCOPY  09/03/2009, 10/14/2008, 09/30/2008, 11/23/2007  . HEMORRHOID SURGERY    . LIVER BIOPSY  01/27/1996  . PORTOCAVAL SHUNT PLACEMENT     and removal    Prior to Admission medications   Medication Sig Start Date End Date Taking? Authorizing Provider  albuterol (PROVENTIL HFA;VENTOLIN HFA) 108 (90 Base) MCG/ACT inhaler Inhale 1-2 puffs into the lungs every 6 (six) hours as needed for wheezing or shortness of breath. 10/08/17  Yes Salary, Avel Peace, MD  alendronate (FOSAMAX) 70 MG tablet Take 70 mg by mouth once a week.  03/26/15  Yes [provider]  budesonide-formoterol (SYMBICORT) 160-4.5 MCG/ACT inhaler Inhale 2 puffs into the lungs 2 (two) times daily. 10/08/17  Yes Salary, Avel Peace, MD  calcium-vitamin D (CALCIUM 500/D) 500-200 MG-UNIT per tablet Take 1 tablet by mouth daily with breakfast.    Yes [provider]  cetirizine (ZYRTEC) 10 MG tablet TAKE 1 TABLET BY MOUTH DAILY FOR ALLERGIES 12/04/14  Yes [provider]  citalopram (CELEXA) 20 MG tablet Take 1 tablet (20 mg total) by mouth daily. 10/09/17  Yes Salary, Avel Peace, MD  Cyanocobalamin (VITAMIN B-12 CR) 1000 MCG TBCR Take 1 tablet by mouth  daily.    Yes [provider]  entecavir (BARACLUDE) 0.5 MG tablet Take 4 mg by mouth 2 (two) times daily.  08/06/14  Yes [provider]  gabapentin (NEURONTIN) 300 MG capsule Take 300 mg by mouth 3 (three) times daily.  08/25/09  Yes [provider]  ipratropium (ATROVENT HFA) 17 MCG/ACT inhaler Inhale 2 puffs into the lungs every 6 (six) hours as needed. 10/08/17  Yes Salary, Montell D, MD  ipratropium-albuterol (DUONEB) 0.5-2.5 (3) MG/3ML SOLN Take 3 mLs by nebulization every 6 (six) hours. 10/08/17  Yes Salary, Avel Peace, MD  metoprolol tartrate (LOPRESSOR) 25 MG tablet Take 12.5 mg by mouth 2 (two) times daily.   Yes [provider]  mirtazapine (REMERON) 15 MG tablet Take 15 mg by mouth at bedtime.    Yes [provider]  Multiple Vitamin (MULTI-VITAMINS) TABS Take 1 tablet by mouth daily.    Yes [provider]  omeprazole (PRILOSEC) 20 MG capsule TK 1 C PO BID 03/17/18  Yes [provider]  tiZANidine (ZANAFLEX) 2 MG tablet Take 2 mg by mouth at bedtime.  07/07/13  Yes [provider]  traZODone (DESYREL) 100 MG tablet Take 1 tablet (100 mg total) by mouth at bedtime. 10/08/17  Yes Salary, Avel Peace, MD  warfarin (COUMADIN) 1 MG tablet TAKE ONE TABLET BY MOUTH EVERY DAY  AT 6PM 06/29/17  Yes Cammie Sickle, MD    Allergies as of 01/23/2018 - Review Complete 10/05/2017  Allergen Reaction Noted  . Penicillins Rash 12/18/2014  . Prednisone Hives 12/18/2014  . Sulfa antibiotics Hives and Shortness Of Breath 12/18/2014    Family History  Problem Relation Age of Onset  . Cancer Daughter   . Asthma Mother   . Breast cancer Neg Hx     Social History   Socioeconomic History  . Marital status: Divorced    Spouse name: Not on file  . Number of children: Not on file  . Years of education: Not on file  . Highest education level: Not on file  Occupational History  . Not on file  Social Needs  . Financial resource  strain: Not on file  . Food insecurity:    Worry: Not on file    Inability: Not on file  . Transportation needs:    Medical: Not on file    Non-medical: Not on file  Tobacco Use  . Smoking status: Never Smoker  . Smokeless tobacco: Never Used  Substance and Sexual Activity  . Alcohol use: No  . Drug use: Not on file  . Sexual activity: Not on file  Lifestyle  . Physical activity:    Days per week: Not on file    Minutes per session: Not on file  . Stress: Not on file  Relationships  . Social connections:    Talks on phone: Not on file    Gets together: Not on file    Attends religious service: Not on file    Active member of club or organization: Not on file    Attends meetings of clubs or organizations: Not on file    Relationship status: Not on file  . Intimate partner violence:    Fear of current or ex partner: Not on file    Emotionally abused: Not on file    Physically abused: Not on file    Forced sexual activity: Not on file  Other Topics Concern  . Not on file  Social History Narrative   Independent at baseline, ambulates without any support   Lives by herself    Review of Systems: See HPI, otherwise negative ROS  Physical Exam: BP 106/74   Pulse 75   Temp (!) 95.1 F (35.1 C) (Tympanic)   Resp 18   Ht 5\' 3"  (1.6 m)   Wt 79.8 kg   SpO2 97%   BMI 31.18 kg/m  General:   Alert,  pleasant and cooperative in NAD Head:  Normocephalic and atraumatic. Neck:  Supple; no masses or thyromegaly. Lungs:  Clear throughout to auscultation.    Heart:  Regular rate and rhythm. Abdomen:  Soft, nontender and nondistended. Normal bowel sounds, without guarding, and without rebound.   Neurologic:  Alert and  oriented x4;  grossly normal neurologically.  Impression/Plan: Erin [name] is here for an colonoscopy to be performed for screening colonoscopy  Risks, benefits, limitations, and alternatives regarding  colonoscopy have been reviewed with the patient.   Questions have been answered.  All parties agreeable.   Erin Cheers, MD  03/31/2018, 1:09 PM

## 2018-03-31 NOTE — Anesthesia Post-op Follow-up Note (Signed)
Anesthesia QCDR form completed.        

## 2018-03-31 NOTE — Anesthesia Preprocedure Evaluation (Signed)
Anesthesia Evaluation  Patient identified by MRN, date of birth, ID band Patient awake    Reviewed: Allergy & Precautions, H&P , NPO status , Patient's Chart, lab work & pertinent test results, reviewed documented beta blocker date and time   History of Anesthesia Complications Negative for: history of anesthetic complications  Airway Mallampati: II  TM Distance: >3 FB Neck ROM: full    Dental  (+) Upper Dentures, Lower Dentures, Dental Advidsory Given   Pulmonary neg shortness of breath, asthma , neg sleep apnea, neg recent URI,           Cardiovascular Exercise Tolerance: Good hypertension, (-) angina(-) CAD, (-) Past MI and (-) CABG (-) dysrhythmias (-) Valvular Problems/Murmurs     Neuro/Psych PSYCHIATRIC DISORDERS Anxiety negative neurological ROS     GI/Hepatic Neg liver ROS, PUD, GERD  ,(+) Hepatitis - (s/p treatment), B  Endo/Other  negative endocrine ROS  Renal/GU negative Renal ROS  negative genitourinary   Musculoskeletal   Abdominal   Peds  Hematology negative hematology ROS (+)   Anesthesia Other Findings Past Medical History: No date: Acute URI No date: Allergy No date: Anxiety No date: Asthma No date: Cancer [facility]     Comment:  lymphoma in remission No date: Diverticulosis 07/2008 & 12/2007: DVT of upper extremity (deep vein thrombosis) ([place]) 10/2007: Gastric ulcer     Comment:  EGD No date: Hepatitis B     Comment:  treated No date: Hypertension No date: Lower extremity edema No date: Lymphoma ([place])     Comment:  Stage 3 large B cell lymphoma   Reproductive/Obstetrics negative OB ROS                             Anesthesia Physical Anesthesia Plan  ASA: III  Anesthesia Plan: General   Post-op Pain Management:    Induction: Intravenous  PONV Risk Score and Plan: 3 and Propofol infusion and TIVA  Airway Management Planned: Natural Airway and Nasal  Cannula  Additional Equipment:   Intra-op Plan:   Post-operative Plan:   Informed Consent: I have reviewed the patients History and Physical, chart, labs and discussed the procedure including the risks, benefits and alternatives for the proposed anesthesia with the patient or authorized representative who has indicated his/her understanding and acceptance.   Dental Advisory Given  Plan Discussed with: Anesthesiologist, CRNA and Surgeon  Anesthesia Plan Comments:         Anesthesia Quick Evaluation

## 2018-03-31 NOTE — Op Note (Signed)
[facility] Gastroenterology Patient Name: Erin [name] Procedure Date: 03/31/2018 1:05 PM MRN: [id number] Account #: [id number] Date of Birth: Jan 07, 1946 Admit Type: Inpatient Age: 72 Room: [facility] ENDO ROOM 1 Gender: Female Note Status: Finalized Procedure:            Colonoscopy Indications:          Screening for colorectal malignant neoplasm Providers:            Manya Silvas, MD Medicines:            Propofol per Anesthesia Complications:        No immediate complications. Procedure:            Pre-Anesthesia Assessment:                       - After reviewing the risks and benefits, the patient                        was deemed in satisfactory condition to undergo the                        procedure.                       After obtaining informed consent, the colonoscope was                        passed under direct vision. Throughout the procedure,                        the patient's blood pressure, pulse, and oxygen                        saturations were monitored continuously. The                        Colonoscope was introduced through the anus and                        advanced to the the cecum, identified by appendiceal                        orifice and ileocecal valve. The colonoscopy was                        somewhat difficult due to restricted mobility of the                        colon, significant looping and a tortuous colon.                        Successful completion of the procedure was aided by                        applying abdominal pressure. The patient tolerated the                        procedure well. The quality of the bowel preparation                        was good. Findings:  A diminutive polyp was found in the sigmoid colon. The polyp was       sessile. The polyp was removed with a hot snare. Polyp not recovered.      A diminutive polyp was found in the transverse colon. The polyp was       sessile. The  polyp was removed with a hot snare. Polyp not recovered.      Multiple small and large-mouthed diverticula were found in the sigmoid       colon, descending colon, transverse colon and ascending colon.      Internal hemorrhoids were found during endoscopy. The hemorrhoids were       small and Grade I (internal hemorrhoids that do not prolapse). Impression:           - One diminutive polyp in the sigmoid colon, removed                        with a hot snare. Resected and retrieved.                       - One diminutive polyp in the transverse colon, removed                        with a hot snare. Resected and retrieved.                       - Diverticulosis in the sigmoid colon, in the                        descending colon, in the transverse colon and in the                        ascending colon.                       - Internal hemorrhoids. Recommendation:       - The findings and recommendations were discussed with                        the patient's family. Manya Silvas, MD 03/31/2018 2:11:27 PM This report has been signed electronically. Number of Addenda: 0 Note Initiated On: 03/31/2018 1:05 PM Scope Withdrawal Time: 0 hours 10 minutes 43 seconds  Total Procedure Duration: 0 hours 39 minutes 21 seconds       [facility]

## 2018-04-01 ENCOUNTER — Encounter: Payer: Self-pay | Admitting: Unknown Physician Specialty

## 2018-04-02 NOTE — Anesthesia Postprocedure Evaluation (Signed)
Anesthesia Post Note  Patient: Erin [name]  Procedure(s) Performed: COLONOSCOPY WITH PROPOFOL (N/A )  Patient location during evaluation: Endoscopy Anesthesia Type: General Level of consciousness: awake and alert Pain management: pain level controlled Vital Signs Assessment: post-procedure vital signs reviewed and stable Respiratory status: spontaneous breathing, nonlabored ventilation, respiratory function stable and patient connected to nasal cannula oxygen Cardiovascular status: blood pressure returned to baseline and stable Postop Assessment: no apparent nausea or vomiting Anesthetic complications: no     Last Vitals:  Vitals:   03/31/18 1420 03/31/18 1430  BP: 105/68 115/72  Pulse: 73 69  Resp: 17 14  Temp:    SpO2: 99% 99%    Last Pain:  Vitals:   04/01/18 1210  TempSrc:   PainSc: 0-No pain                 Martha Clan

## 2018-05-19 ENCOUNTER — Inpatient Hospital Stay: Payer: Medicare Other | Attending: Internal Medicine

## 2018-05-19 DIAGNOSIS — Z452 Encounter for adjustment and management of vascular access device: Secondary | ICD-10-CM | POA: Diagnosis not present

## 2018-05-19 DIAGNOSIS — C8258 Diffuse follicle center lymphoma, lymph nodes of multiple sites: Secondary | ICD-10-CM | POA: Insufficient documentation

## 2018-05-19 DIAGNOSIS — Z923 Personal history of irradiation: Secondary | ICD-10-CM | POA: Diagnosis not present

## 2018-05-19 DIAGNOSIS — Z9221 Personal history of antineoplastic chemotherapy: Secondary | ICD-10-CM | POA: Diagnosis not present

## 2018-05-19 DIAGNOSIS — C801 Malignant (primary) neoplasm, unspecified: Secondary | ICD-10-CM

## 2018-05-19 MED ORDER — HEPARIN SOD (PORK) LOCK FLUSH 100 UNIT/ML IV SOLN
500.0000 [IU] | Freq: Once | INTRAVENOUS | Status: AC
  Administered 2018-05-19: 500 [IU] via INTRAVENOUS
  Filled 2018-05-19: qty 5

## 2018-05-19 MED ORDER — SODIUM CHLORIDE 0.9% FLUSH
10.0000 mL | INTRAVENOUS | Status: DC | PRN
  Administered 2018-05-19: 10 mL via INTRAVENOUS
  Filled 2018-05-19: qty 10

## 2018-06-13 ENCOUNTER — Other Ambulatory Visit: Payer: Self-pay | Admitting: Family Medicine

## 2018-06-13 DIAGNOSIS — Z1231 Encounter for screening mammogram for malignant neoplasm of breast: Secondary | ICD-10-CM

## 2018-07-12 ENCOUNTER — Ambulatory Visit
Admission: RE | Admit: 2018-07-12 | Discharge: 2018-07-12 | Disposition: A | Discharge status: Home / Self Care | Payer: Medicare Other | Source: Ambulatory Visit | Attending: Family Medicine | Admitting: Family Medicine

## 2018-07-12 DIAGNOSIS — Z1231 Encounter for screening mammogram for malignant neoplasm of breast: Secondary | ICD-10-CM | POA: Diagnosis present

## 2018-07-21 ENCOUNTER — Inpatient Hospital Stay: Payer: Medicare Other | Attending: Internal Medicine

## 2018-07-21 DIAGNOSIS — C8258 Diffuse follicle center lymphoma, lymph nodes of multiple sites: Secondary | ICD-10-CM | POA: Insufficient documentation

## 2018-07-21 DIAGNOSIS — Z9221 Personal history of antineoplastic chemotherapy: Secondary | ICD-10-CM | POA: Insufficient documentation

## 2018-07-21 DIAGNOSIS — Z923 Personal history of irradiation: Secondary | ICD-10-CM | POA: Diagnosis not present

## 2018-07-21 DIAGNOSIS — Z452 Encounter for adjustment and management of vascular access device: Secondary | ICD-10-CM | POA: Diagnosis not present

## 2018-07-21 DIAGNOSIS — Z95828 Presence of other vascular implants and grafts: Secondary | ICD-10-CM

## 2018-07-21 MED ORDER — SODIUM CHLORIDE 0.9% FLUSH
10.0000 mL | Freq: Once | INTRAVENOUS | Status: AC
  Administered 2018-07-21: 10 mL via INTRAVENOUS
  Filled 2018-07-21: qty 10

## 2018-07-21 MED ORDER — HEPARIN SOD (PORK) LOCK FLUSH 100 UNIT/ML IV SOLN
500.0000 [IU] | Freq: Once | INTRAVENOUS | Status: AC
  Administered 2018-07-21: 500 [IU] via INTRAVENOUS
  Filled 2018-07-21: qty 5

## 2018-08-06 ENCOUNTER — Encounter: Payer: Self-pay | Admitting: Emergency Medicine

## 2018-08-06 ENCOUNTER — Emergency Department: Payer: Medicare Other

## 2018-08-06 ENCOUNTER — Emergency Department
Admission: EM | Admit: 2018-08-06 | Discharge: 2018-08-06 | Disposition: A | Discharge status: Home / Self Care | Payer: Medicare Other | Attending: Emergency Medicine | Admitting: Emergency Medicine

## 2018-08-06 ENCOUNTER — Other Ambulatory Visit: Payer: Self-pay

## 2018-08-06 DIAGNOSIS — Z7901 Long term (current) use of anticoagulants: Secondary | ICD-10-CM | POA: Diagnosis not present

## 2018-08-06 DIAGNOSIS — Z8571 Personal history of Hodgkin lymphoma: Secondary | ICD-10-CM | POA: Insufficient documentation

## 2018-08-06 DIAGNOSIS — I10 Essential (primary) hypertension: Secondary | ICD-10-CM | POA: Diagnosis not present

## 2018-08-06 DIAGNOSIS — R0602 Shortness of breath: Secondary | ICD-10-CM | POA: Diagnosis not present

## 2018-08-06 DIAGNOSIS — J45909 Unspecified asthma, uncomplicated: Secondary | ICD-10-CM | POA: Insufficient documentation

## 2018-08-06 DIAGNOSIS — Z79899 Other long term (current) drug therapy: Secondary | ICD-10-CM | POA: Diagnosis not present

## 2018-08-06 LAB — COMPREHENSIVE METABOLIC PANEL
ALT: 16 U/L (ref 0–44)
AST: 24 U/L (ref 15–41)
Albumin: 4.3 g/dL (ref 3.5–5.0)
Alkaline Phosphatase: 93 U/L (ref 38–126)
Anion gap: 8 (ref 5–15)
BUN: 18 mg/dL (ref 8–23)
CO2: 29 mmol/L (ref 22–32)
Calcium: 9.6 mg/dL (ref 8.9–10.3)
Chloride: 100 mmol/L (ref 98–111)
Creatinine, Ser: 1.5 mg/dL — ABNORMAL HIGH (ref 0.44–1.00)
GFR calc Af Amer: 40 mL/min — ABNORMAL LOW (ref >60)
GFR calc non Af Amer: 35 mL/min — ABNORMAL LOW (ref >60)
Glucose, Bld: 104 mg/dL — ABNORMAL HIGH (ref 70–99)
Potassium: 3.3 mmol/L — ABNORMAL LOW (ref 3.5–5.1)
Sodium: 137 mmol/L (ref 135–145)
Total Bilirubin: 0.5 mg/dL (ref 0.3–1.2)
Total Protein: 7.5 g/dL (ref 6.5–8.1)

## 2018-08-06 LAB — CBC
HCT: 39.2 % (ref 36.0–46.0)
Hemoglobin: 13 g/dL (ref 12.0–15.0)
MCH: 31.4 pg (ref 26.0–34.0)
MCHC: 33.2 g/dL (ref 30.0–36.0)
MCV: 94.7 fL (ref 80.0–100.0)
Platelets: 180 10*3/uL (ref 150–400)
RBC: 4.14 MIL/uL (ref 3.87–5.11)
RDW: 12.8 % (ref 11.5–15.5)
WBC: 6.1 10*3/uL (ref 4.0–10.5)
nRBC: 0 % (ref 0.0–0.2)

## 2018-08-06 LAB — TROPONIN I: Troponin I: <0.03 ng/mL (ref <0.03)

## 2018-08-06 MED ORDER — AZITHROMYCIN 250 MG PO TABS: ORAL_TABLET | ORAL | 0 refills | Status: AC

## 2018-08-06 MED ORDER — IPRATROPIUM-ALBUTEROL 0.5-2.5 (3) MG/3ML IN SOLN
3.0000 mL | Freq: Once | RESPIRATORY_TRACT | Status: AC
  Administered 2018-08-06: 3 mL via RESPIRATORY_TRACT
  Filled 2018-08-06: qty 3

## 2018-08-06 MED ORDER — ALBUTEROL SULFATE (2.5 MG/3ML) 0.083% IN NEBU: 2.5000 mg | INHALATION_SOLUTION | Freq: Four times a day (QID) | RESPIRATORY_TRACT | 12 refills | Status: DC | PRN

## 2018-08-06 NOTE — ED Triage Notes (Signed)
Pt presents to ED via POV with c/o SOB x 3 days. Pt also c/o productive cough, unable to state color of sputum. Pt is alert and oriented at this time, states hx of "respiratory problem".

## 2018-08-06 NOTE — ED Notes (Signed)
Pt verbalized understanding of discharge instructions. NAD at this time. 

## 2018-08-06 NOTE — Discharge Instructions (Addendum)
Please seek medical attention for any high fevers, chest pain, shortness of breath, change in behavior, persistent vomiting, bloody stool or any other new or concerning symptoms.  

## 2018-08-06 NOTE — ED Provider Notes (Signed)
[facility] Emergency Department Provider Note   ____________________________________________   I have reviewed the triage vital signs and the nursing notes.   HISTORY  Chief Complaint Shortness of Breath   History limited by: Not Limited   HPI Erin [name] is a 73 y.o. female who presents to the emergency department today because of concerns for shortness of breath.  Patient states that she has had some shortness of breath for the past 3 days.  She has had some associated cough and congestion.  She states she is worried because she has had colds go down her chest and have turned into pneumonias in the past.  Patient has a nebulizer at home but states she ran out of her breathing treatments has not been able to give her any.  She denies any fevers with this.  She states that she does feel like she has some chest discomfort with the shortness of breath.  Per medical record review patient has a history of HTN  Past Medical History:  Diagnosis Date  . Acute URI   . Allergy   . Anxiety   . Asthma   . Cancer ([place])    lymphoma in remission  . Diverticulosis   . DVT of upper extremity (deep vein thrombosis) ([place]) 07/2008 & 12/2007  . Gastric ulcer 10/2007   EGD  . Hepatitis B    treated  . Hypertension   . Lower extremity edema   . Lymphoma ([place])    Stage 3 large B cell lymphoma    Patient Active Problem List   Diagnosis Date Noted  . Asthma exacerbation 10/05/2017  . 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 Disease of liver 12/18/2014  . Acute URI 11/07/2014  . BP (high blood pressure) 05/09/2014  . Chronic hepatitis B virus infection ([place]) 03/27/2011  . Lymphoma ([place]) 10/27/2007    Past Surgical History:  Procedure Laterality Date  . CHOLECYSTECTOMY    . COLONOSCOPY  11/23/2007, 05/12/1994  . COLONOSCOPY WITH PROPOFOL N/A 03/31/2018   Procedure: COLONOSCOPY WITH PROPOFOL;  Surgeon: Manya Silvas, MD;  Location: [facility] ENDOSCOPY;  Service: Endoscopy;  Laterality: N/A;  . ESOPHAGOGASTRODUODENOSCOPY  09/03/2009, 10/14/2008, 09/30/2008, 11/23/2007  . HEMORRHOID SURGERY    . LIVER BIOPSY  01/27/1996  . PORTOCAVAL SHUNT PLACEMENT     and removal    Prior to Admission medications   Medication Sig Start Date End Date Taking? Authorizing Provider  albuterol (PROVENTIL HFA;VENTOLIN HFA) 108 (90 Base) MCG/ACT inhaler Inhale 1-2 puffs into the lungs every 6 (six) hours as needed for wheezing or shortness of breath. 10/08/17   Salary, Avel Peace, MD  alendronate (FOSAMAX) 70 MG tablet Take 70 mg by mouth once a week.  03/26/15   [provider]  budesonide-formoterol (SYMBICORT) 160-4.5 MCG/ACT inhaler Inhale 2 puffs into the lungs 2 (two) times daily. 10/08/17   Salary, Avel Peace, MD  calcium-vitamin D (CALCIUM 500/D) 500-200 MG-UNIT per tablet Take 1 tablet by mouth daily with breakfast.     [provider]  cetirizine (ZYRTEC) 10 MG tablet TAKE 1 TABLET BY MOUTH DAILY FOR ALLERGIES 12/04/14   [provider]  citalopram (CELEXA) 20 MG tablet Take 1 tablet (20 mg total) by mouth daily. 10/09/17   Salary, Avel Peace, MD  Cyanocobalamin (VITAMIN B-12 CR) 1000 MCG TBCR Take 1 tablet by mouth daily.     [provider]  entecavir (BARACLUDE) 0.5 MG tablet Take 4 mg by  mouth 2 (two) times daily.  08/06/14   [provider]  gabapentin (NEURONTIN) 300 MG capsule Take 300 mg by mouth 3 (three) times daily.  08/25/09   [provider]  ipratropium (ATROVENT HFA) 17 MCG/ACT inhaler Inhale 2 puffs into the lungs every 6 (six) hours as needed. 10/08/17   Salary, Holly Bodily D, MD  ipratropium-albuterol (DUONEB) 0.5-2.5 (3) MG/3ML SOLN Take 3 mLs by nebulization every 6 (six) hours. 10/08/17   Salary, Avel Peace, MD  metoprolol tartrate (LOPRESSOR) 25 MG tablet Take 12.5 mg by mouth 2 (two) times daily.    [provider]  mirtazapine (REMERON) 15 MG tablet Take 15 mg  by mouth at bedtime.     [provider]  Multiple Vitamin (MULTI-VITAMINS) TABS Take 1 tablet by mouth daily.     [provider]  omeprazole (PRILOSEC) 20 MG capsule TK 1 C PO BID 03/17/18   [provider]  tiZANidine (ZANAFLEX) 2 MG tablet Take 2 mg by mouth at bedtime.  07/07/13   [provider]  traZODone (DESYREL) 100 MG tablet Take 1 tablet (100 mg total) by mouth at bedtime. 10/08/17   Salary, Holly Bodily D, MD  warfarin (COUMADIN) 1 MG tablet TAKE ONE TABLET BY MOUTH EVERY DAY AT [facility] 06/29/17   Cammie Sickle, MD    Allergies Penicillins; Prednisone; and Sulfa antibiotics  Family History  Problem Relation Age of Onset  . Cancer Daughter   . Asthma Mother   . Breast cancer Neg Hx     Social History Social History   Tobacco Use  . Smoking status: Never Smoker  . Smokeless tobacco: Never Used  Substance Use Topics  . Alcohol use: No  . Drug use: Not on file    Review of Systems Constitutional: No fever/chills Eyes: No visual changes. ENT: No sore throat. Cardiovascular: Positive for chest pain. Respiratory: Positive for shortness of breath. Gastrointestinal: No abdominal pain.  No nausea, no vomiting.  No diarrhea.   Genitourinary: Negative for dysuria. Musculoskeletal: Negative for back pain. Skin: Negative for rash. Neurological: Negative for headaches, focal weakness or numbness.  ____________________________________________   PHYSICAL EXAM:  VITAL SIGNS: ED Triage Vitals  Enc Vitals Group     BP 08/06/18 1346 (!) 134/91     Pulse Rate 08/06/18 1346 (!) 102     Resp 08/06/18 1346 18     Temp 08/06/18 1346 98 F (36.7 C)     Temp Source 08/06/18 1346 Oral     SpO2 08/06/18 1346 98 %     Weight 08/06/18 1351 175 lb (79.4 kg)     Height 08/06/18 1351 5\' 4"  (1.626 m)     Head Circumference --      Peak Flow --      Pain Score 08/06/18 1351 0   Constitutional: Alert and oriented.  Eyes: Conjunctivae are normal.   ENT      Head: Normocephalic and atraumatic.      Nose: No congestion/rhinnorhea.      Mouth/Throat: Mucous membranes are moist.      Neck: No stridor. Hematological/Lymphatic/Immunilogical: No cervical lymphadenopathy. Cardiovascular: Normal rate, regular rhythm.  No murmurs, rubs, or gallops. Respiratory: Normal respiratory effort without tachypnea nor retractions. Mild expiratory wheezing bilaterally. Gastrointestinal: Soft and non tender. No rebound. No guarding.  Genitourinary: Deferred Musculoskeletal: Normal range of motion in all extremities. No lower extremity edema. Neurologic:  Normal speech and language. No gross focal neurologic deficits are appreciated.  Skin:  Skin is  warm, dry and intact. No rash noted. Psychiatric: Mood and affect are normal. Speech and behavior are normal. Patient exhibits appropriate insight and judgment.  ____________________________________________    LABS (pertinent positives/negatives)  Trop <0.03 CBC wbc 6.1, hgb 13.0, plt 180 CMP na 137, k 3.3, glu 104, cr 1.50, ca 9.6  ____________________________________________   EKG  I, Nance Pear, attending physician, personally viewed and interpreted this EKG  EKG Time: 1357 Rate: 97 Rhythm: normal sinus rhythm Axis: left axis deviation Intervals: qtc 341 QRS: narrow, low voltage QRS ST changes: no st elevation Impression: abnormal ekg  ____________________________________________    RADIOLOGY  CXR Lower lung opacities, atelectasis vs pneumonia.   ____________________________________________   PROCEDURES  Procedures  ____________________________________________   INITIAL IMPRESSION / ASSESSMENT AND PLAN / ED COURSE  Pertinent labs & imaging results that were available during my care of the patient were reviewed by me and considered in my medical decision making (see chart for details).   Patient presented to the emergency department today because of concerns for  shortness of breath.  She is also having some chest pressure.  On exam patient has mild bilateral expiratory wheezing.  Patient was given DuoNeb treatment here which did help.  Chest x-ray was concern for possible pneumonia in the lower lungs however think atelectasis more likely given lack of fever or white count however given patient's concern will plan on treating with azithromycin.  Also discharge patient with prescription for albuterol for breathing treatments.  ____________________________________________   FINAL CLINICAL IMPRESSION(S) / ED DIAGNOSES  Final diagnoses:  Shortness of breath     Note: This dictation was prepared with Dragon dictation. Any transcriptional errors that result from this process are unintentional     Nance Pear, MD 08/06/18 1752

## 2018-09-22 ENCOUNTER — Ambulatory Visit: Payer: Medicare Other | Admitting: Internal Medicine

## 2018-09-22 ENCOUNTER — Other Ambulatory Visit: Payer: Medicare Other

## 2018-10-01 ENCOUNTER — Encounter: Payer: Self-pay | Admitting: Emergency Medicine

## 2018-10-01 ENCOUNTER — Emergency Department: Payer: Medicare Other

## 2018-10-01 ENCOUNTER — Other Ambulatory Visit: Payer: Self-pay

## 2018-10-01 ENCOUNTER — Inpatient Hospital Stay
Admission: EM | Admit: 2018-10-01 | Discharge: 2018-10-05 | DRG: 871 | Disposition: A | Discharge status: Home Health | Payer: Medicare Other | Attending: Internal Medicine | Admitting: Internal Medicine

## 2018-10-01 DIAGNOSIS — Z8572 Personal history of non-Hodgkin lymphomas: Secondary | ICD-10-CM | POA: Diagnosis not present

## 2018-10-01 DIAGNOSIS — Z7951 Long term (current) use of inhaled steroids: Secondary | ICD-10-CM | POA: Diagnosis not present

## 2018-10-01 DIAGNOSIS — Z880 Allergy status to penicillin: Secondary | ICD-10-CM

## 2018-10-01 DIAGNOSIS — Z7901 Long term (current) use of anticoagulants: Secondary | ICD-10-CM

## 2018-10-01 DIAGNOSIS — E871 Hypo-osmolality and hyponatremia: Secondary | ICD-10-CM | POA: Diagnosis present

## 2018-10-01 DIAGNOSIS — J189 Pneumonia, unspecified organism: Secondary | ICD-10-CM

## 2018-10-01 DIAGNOSIS — B1910 Unspecified viral hepatitis B without hepatic coma: Secondary | ICD-10-CM | POA: Diagnosis present

## 2018-10-01 DIAGNOSIS — J45909 Unspecified asthma, uncomplicated: Secondary | ICD-10-CM | POA: Diagnosis present

## 2018-10-01 DIAGNOSIS — I4891 Unspecified atrial fibrillation: Secondary | ICD-10-CM | POA: Diagnosis present

## 2018-10-01 DIAGNOSIS — E876 Hypokalemia: Secondary | ICD-10-CM | POA: Diagnosis present

## 2018-10-01 DIAGNOSIS — A419 Sepsis, unspecified organism: Secondary | ICD-10-CM | POA: Diagnosis present

## 2018-10-01 DIAGNOSIS — R05 Cough: Secondary | ICD-10-CM | POA: Diagnosis present

## 2018-10-01 DIAGNOSIS — Z825 Family history of asthma and other chronic lower respiratory diseases: Secondary | ICD-10-CM | POA: Diagnosis not present

## 2018-10-01 DIAGNOSIS — I129 Hypertensive chronic kidney disease with stage 1 through stage 4 chronic kidney disease, or unspecified chronic kidney disease: Secondary | ICD-10-CM | POA: Diagnosis present

## 2018-10-01 DIAGNOSIS — J181 Lobar pneumonia, unspecified organism: Secondary | ICD-10-CM

## 2018-10-01 DIAGNOSIS — Z79899 Other long term (current) drug therapy: Secondary | ICD-10-CM | POA: Diagnosis not present

## 2018-10-01 DIAGNOSIS — G629 Polyneuropathy, unspecified: Secondary | ICD-10-CM | POA: Diagnosis present

## 2018-10-01 DIAGNOSIS — A403 Sepsis due to Streptococcus pneumoniae: Principal | ICD-10-CM | POA: Diagnosis present

## 2018-10-01 DIAGNOSIS — N183 Chronic kidney disease, stage 3 (moderate): Secondary | ICD-10-CM | POA: Diagnosis present

## 2018-10-01 DIAGNOSIS — C8590 Non-Hodgkin lymphoma, unspecified, unspecified site: Secondary | ICD-10-CM

## 2018-10-01 DIAGNOSIS — Z20828 Contact with and (suspected) exposure to other viral communicable diseases: Secondary | ICD-10-CM | POA: Diagnosis present

## 2018-10-01 DIAGNOSIS — K219 Gastro-esophageal reflux disease without esophagitis: Secondary | ICD-10-CM | POA: Diagnosis present

## 2018-10-01 DIAGNOSIS — F419 Anxiety disorder, unspecified: Secondary | ICD-10-CM | POA: Diagnosis present

## 2018-10-01 DIAGNOSIS — Z882 Allergy status to sulfonamides status: Secondary | ICD-10-CM | POA: Diagnosis not present

## 2018-10-01 DIAGNOSIS — Z888 Allergy status to other drugs, medicaments and biological substances status: Secondary | ICD-10-CM

## 2018-10-01 DIAGNOSIS — Z86718 Personal history of other venous thrombosis and embolism: Secondary | ICD-10-CM | POA: Diagnosis not present

## 2018-10-01 DIAGNOSIS — F329 Major depressive disorder, single episode, unspecified: Secondary | ICD-10-CM | POA: Diagnosis present

## 2018-10-01 DIAGNOSIS — J13 Pneumonia due to Streptococcus pneumoniae: Secondary | ICD-10-CM | POA: Diagnosis present

## 2018-10-01 DIAGNOSIS — Z9049 Acquired absence of other specified parts of digestive tract: Secondary | ICD-10-CM | POA: Diagnosis not present

## 2018-10-01 LAB — RESPIRATORY PANEL BY PCR

## 2018-10-01 LAB — COMPREHENSIVE METABOLIC PANEL
ALT: 19 U/L (ref 0–44)
AST: 21 U/L (ref 15–41)
Albumin: 3.1 g/dL — ABNORMAL LOW (ref 3.5–5.0)
Alkaline Phosphatase: 113 U/L (ref 38–126)
Anion gap: 13 (ref 5–15)
BUN: 17 mg/dL (ref 8–23)
CO2: 21 mmol/L — ABNORMAL LOW (ref 22–32)
Calcium: 7.4 mg/dL — ABNORMAL LOW (ref 8.9–10.3)
Chloride: 94 mmol/L — ABNORMAL LOW (ref 98–111)
Creatinine, Ser: 1.66 mg/dL — ABNORMAL HIGH (ref 0.44–1.00)
GFR calc Af Amer: 36 mL/min — ABNORMAL LOW (ref >60)
GFR calc non Af Amer: 31 mL/min — ABNORMAL LOW (ref >60)
Glucose, Bld: 146 mg/dL — ABNORMAL HIGH (ref 70–99)
Potassium: 2.8 mmol/L — ABNORMAL LOW (ref 3.5–5.1)
Sodium: 128 mmol/L — ABNORMAL LOW (ref 135–145)
Total Bilirubin: 0.4 mg/dL (ref 0.3–1.2)
Total Protein: 6.8 g/dL (ref 6.5–8.1)

## 2018-10-01 LAB — BLOOD CULTURE ID PANEL (REFLEXED)

## 2018-10-01 LAB — CBC WITH DIFFERENTIAL/PLATELET
Abs Immature Granulocytes: 0.28 10*3/uL — ABNORMAL HIGH (ref 0.00–0.07)
Basophils Absolute: 0 10*3/uL (ref 0.0–0.1)
Basophils Relative: 0 %
Eosinophils Absolute: 0 10*3/uL (ref 0.0–0.5)
Eosinophils Relative: 0 %
HCT: 29.6 % — ABNORMAL LOW (ref 36.0–46.0)
Hemoglobin: 10.4 g/dL — ABNORMAL LOW (ref 12.0–15.0)
Immature Granulocytes: 2 %
Lymphocytes Relative: 3 %
Lymphs Abs: 0.5 10*3/uL — ABNORMAL LOW (ref 0.7–4.0)
MCH: 31.7 pg (ref 26.0–34.0)
MCHC: 35.1 g/dL (ref 30.0–36.0)
MCV: 90.2 fL (ref 80.0–100.0)
Monocytes Absolute: 1 10*3/uL (ref 0.1–1.0)
Monocytes Relative: 7 %
Neutro Abs: 11.6 10*3/uL — ABNORMAL HIGH (ref 1.7–7.7)
Neutrophils Relative %: 88 %
Platelets: 188 10*3/uL (ref 150–400)
RBC: 3.28 MIL/uL — ABNORMAL LOW (ref 3.87–5.11)
RDW: 12.9 % (ref 11.5–15.5)
Smear Review: NORMAL
WBC: 13.3 10*3/uL — ABNORMAL HIGH (ref 4.0–10.5)
nRBC: 0 % (ref 0.0–0.2)

## 2018-10-01 LAB — PROTIME-INR
INR: 1.3 — ABNORMAL HIGH (ref 0.8–1.2)
Prothrombin Time: 15.7 seconds — ABNORMAL HIGH (ref 11.4–15.2)

## 2018-10-01 LAB — HEMOGLOBIN A1C
Hgb A1c MFr Bld: 5.4 % (ref 4.8–5.6)
Mean Plasma Glucose: 108.28 mg/dL

## 2018-10-01 LAB — TSH: TSH: 2.021 u[IU]/mL (ref 0.350–4.500)

## 2018-10-01 LAB — URINALYSIS, ROUTINE W REFLEX MICROSCOPIC
Bacteria, UA: NONE SEEN
Bilirubin Urine: NEGATIVE
Glucose, UA: NEGATIVE mg/dL
Ketones, ur: NEGATIVE mg/dL
Leukocytes,Ua: NEGATIVE
Nitrite: NEGATIVE
Protein, ur: NEGATIVE mg/dL
Specific Gravity, Urine: 1.003 — ABNORMAL LOW (ref 1.005–1.030)
pH: 7 (ref 5.0–8.0)

## 2018-10-01 LAB — LACTIC ACID, PLASMA: Lactic Acid, Venous: 1.9 mmol/L (ref 0.5–1.9)

## 2018-10-01 LAB — INFLUENZA PANEL BY PCR (TYPE A & B)
Influenza A By PCR: NEGATIVE
Influenza B By PCR: NEGATIVE

## 2018-10-01 MED ORDER — LORATADINE 10 MG PO TABS
10.0000 mg | ORAL_TABLET | Freq: Every day | ORAL | Status: DC
  Administered 2018-10-01 – 2018-10-05 (×5): 10 mg via ORAL
  Filled 2018-10-01 (×5): qty 1

## 2018-10-01 MED ORDER — TIZANIDINE HCL 2 MG PO TABS
2.0000 mg | ORAL_TABLET | Freq: Every day | ORAL | Status: DC
  Administered 2018-10-01 – 2018-10-04 (×4): 2 mg via ORAL
  Filled 2018-10-01 (×5): qty 1

## 2018-10-01 MED ORDER — ACETAMINOPHEN 650 MG RE SUPP: 650.0000 mg | Freq: Four times a day (QID) | RECTAL | Status: DC | PRN

## 2018-10-01 MED ORDER — GABAPENTIN 300 MG PO CAPS
300.0000 mg | ORAL_CAPSULE | Freq: Three times a day (TID) | ORAL | Status: DC
  Administered 2018-10-01 – 2018-10-02 (×3): 300 mg via ORAL
  Filled 2018-10-01 (×3): qty 1

## 2018-10-01 MED ORDER — WARFARIN - PHARMACIST DOSING INPATIENT
Freq: Every day | Status: DC
  Administered 2018-10-04: 18:00:00

## 2018-10-01 MED ORDER — ALBUTEROL SULFATE HFA 108 (90 BASE) MCG/ACT IN AERS: 1.0000 | INHALATION_SPRAY | Freq: Four times a day (QID) | RESPIRATORY_TRACT | Status: DC | PRN

## 2018-10-01 MED ORDER — POTASSIUM CHLORIDE IN NACL 20-0.9 MEQ/L-% IV SOLN
INTRAVENOUS | Status: DC
  Administered 2018-10-01 – 2018-10-02 (×5): via INTRAVENOUS
  Filled 2018-10-01 (×7): qty 1000

## 2018-10-01 MED ORDER — CALCIUM CARBONATE-VITAMIN D 500-200 MG-UNIT PO TABS
1.0000 | ORAL_TABLET | Freq: Every day | ORAL | Status: DC
  Administered 2018-10-02 – 2018-10-05 (×4): 1 via ORAL
  Filled 2018-10-01 (×4): qty 1

## 2018-10-01 MED ORDER — POTASSIUM CHLORIDE CRYS ER 20 MEQ PO TBCR: 40.0000 meq | EXTENDED_RELEASE_TABLET | ORAL | Status: AC

## 2018-10-01 MED ORDER — ADULT MULTIVITAMIN W/MINERALS CH
1.0000 | ORAL_TABLET | Freq: Every day | ORAL | Status: DC
  Administered 2018-10-01 – 2018-10-05 (×5): 1 via ORAL
  Filled 2018-10-01 (×5): qty 1

## 2018-10-01 MED ORDER — ACETAMINOPHEN 500 MG PO TABS: 1000.0000 mg | ORAL_TABLET | Freq: Once | ORAL | Status: DC

## 2018-10-01 MED ORDER — ALBUTEROL SULFATE HFA 108 (90 BASE) MCG/ACT IN AERS
2.0000 | INHALATION_SPRAY | RESPIRATORY_TRACT | Status: DC
  Administered 2018-10-01 – 2018-10-03 (×10): 2 via RESPIRATORY_TRACT
  Filled 2018-10-01: qty 6.7

## 2018-10-01 MED ORDER — SODIUM CHLORIDE 0.9 % IV SOLN
2.0000 g | INTRAVENOUS | Status: DC
  Administered 2018-10-02 – 2018-10-05 (×4): 2 g via INTRAVENOUS
  Filled 2018-10-01 (×4): qty 2

## 2018-10-01 MED ORDER — LEVOFLOXACIN IN D5W 750 MG/150ML IV SOLN
750.0000 mg | Freq: Once | INTRAVENOUS | Status: AC
  Administered 2018-10-01: 750 mg via INTRAVENOUS
  Filled 2018-10-01: qty 150

## 2018-10-01 MED ORDER — VITAMIN B-12 1000 MCG PO TABS
1000.0000 ug | ORAL_TABLET | Freq: Every day | ORAL | Status: DC
  Administered 2018-10-01 – 2018-10-05 (×5): 1000 ug via ORAL
  Filled 2018-10-01 (×5): qty 1

## 2018-10-01 MED ORDER — MIRTAZAPINE 15 MG PO TABS
15.0000 mg | ORAL_TABLET | Freq: Every day | ORAL | Status: DC
  Administered 2018-10-01 – 2018-10-04 (×4): 15 mg via ORAL
  Filled 2018-10-01 (×5): qty 1

## 2018-10-01 MED ORDER — ALBUTEROL SULFATE (2.5 MG/3ML) 0.083% IN NEBU: 2.5000 mg | INHALATION_SOLUTION | Freq: Four times a day (QID) | RESPIRATORY_TRACT | Status: DC | PRN

## 2018-10-01 MED ORDER — ACETAMINOPHEN 325 MG PO TABS
650.0000 mg | ORAL_TABLET | Freq: Four times a day (QID) | ORAL | Status: DC | PRN
  Administered 2018-10-01 – 2018-10-02 (×4): 650 mg via ORAL
  Filled 2018-10-01 (×4): qty 2

## 2018-10-01 MED ORDER — WARFARIN SODIUM 1 MG PO TABS
1.5000 mg | ORAL_TABLET | Freq: Once | ORAL | Status: AC
  Administered 2018-10-01: 1.5 mg via ORAL
  Filled 2018-10-01 (×2): qty 1

## 2018-10-01 MED ORDER — LEVOFLOXACIN IN D5W 750 MG/150ML IV SOLN: 750.0000 mg | INTRAVENOUS | Status: DC

## 2018-10-01 MED ORDER — ONDANSETRON HCL 4 MG PO TABS: 4.0000 mg | ORAL_TABLET | Freq: Four times a day (QID) | ORAL | Status: DC | PRN

## 2018-10-01 MED ORDER — SODIUM CHLORIDE 0.9 % IV BOLUS
1000.0000 mL | Freq: Once | INTRAVENOUS | Status: AC
  Administered 2018-10-01: 1000 mL via INTRAVENOUS

## 2018-10-01 MED ORDER — ENTECAVIR 0.5 MG PO TABS: 0.5000 mg | ORAL_TABLET | Freq: Every day | ORAL | Status: DC

## 2018-10-01 MED ORDER — TRAZODONE HCL 100 MG PO TABS
100.0000 mg | ORAL_TABLET | Freq: Every day | ORAL | Status: DC
  Administered 2018-10-01 – 2018-10-04 (×4): 100 mg via ORAL
  Filled 2018-10-01 (×4): qty 1

## 2018-10-01 MED ORDER — ONDANSETRON HCL 4 MG/2ML IJ SOLN: 4.0000 mg | Freq: Four times a day (QID) | INTRAMUSCULAR | Status: DC | PRN

## 2018-10-01 MED ORDER — CITALOPRAM HYDROBROMIDE 20 MG PO TABS
20.0000 mg | ORAL_TABLET | Freq: Every day | ORAL | Status: DC
  Administered 2018-10-01 – 2018-10-05 (×5): 20 mg via ORAL
  Filled 2018-10-01 (×5): qty 1

## 2018-10-01 MED ORDER — SODIUM CHLORIDE 0.9% FLUSH
3.0000 mL | Freq: Two times a day (BID) | INTRAVENOUS | Status: DC
  Administered 2018-10-01 – 2018-10-05 (×6): 3 mL via INTRAVENOUS

## 2018-10-01 MED ORDER — PANTOPRAZOLE SODIUM 40 MG PO TBEC
40.0000 mg | DELAYED_RELEASE_TABLET | Freq: Every day | ORAL | Status: DC
  Administered 2018-10-01 – 2018-10-05 (×5): 40 mg via ORAL
  Filled 2018-10-01 (×5): qty 1

## 2018-10-01 MED ORDER — DOCUSATE SODIUM 100 MG PO CAPS
100.0000 mg | ORAL_CAPSULE | Freq: Two times a day (BID) | ORAL | Status: DC
  Administered 2018-10-01 – 2018-10-05 (×9): 100 mg via ORAL
  Filled 2018-10-01 (×9): qty 1

## 2018-10-01 MED ORDER — ACETAMINOPHEN 500 MG PO TABS
1000.0000 mg | ORAL_TABLET | Freq: Once | ORAL | Status: AC
  Administered 2018-10-01: 1000 mg via ORAL
  Filled 2018-10-01: qty 2

## 2018-10-01 NOTE — ED Provider Notes (Signed)
[facility] Emergency Department Provider Note   ____________________________________________   First MD Initiated Contact with Patient 10/01/18 [phone]     (approximate)  I have reviewed the triage vital signs and the nursing notes.   HISTORY  Chief Complaint Cough and Fever    HPI Erin [name] is a 73 y.o. female who comes from nursing home complaining of 3 weeks of cough and fever.  The cough is nonproductive.  She says she is coughing enough that her chest hurts when she coughs.  Last couple days is been hurting to take a deep breath 2 but that is, she is sure, because she has been coughing so much.  Pain is moderate and achy with coughing.  She is not short of breath.  Fever today is 100.4.  Nothing seems to make it better or worse.  Symptoms have not changed since onset 3 weeks ago.         Past Medical History:  Diagnosis Date  . Acute URI   . Allergy   . Anxiety   . Asthma   . Cancer ([place])    lymphoma in remission  . Diverticulosis   . DVT of upper extremity (deep vein thrombosis) ([place]) 07/2008 & 12/2007  . Gastric ulcer 10/2007   EGD  . Hepatitis B    treated  . Hypertension   . Lower extremity edema   . 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 Sepsis ([place]) 10/01/2018  . Asthma exacerbation 10/05/2017  . 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 Disease of liver 12/18/2014  . Acute URI 11/07/2014  . BP (high blood pressure) 05/09/2014  . Chronic hepatitis B virus infection ([place]) 03/27/2011  . Lymphoma ([place]) 10/27/2007    Past Surgical History:  Procedure Laterality Date  . CHOLECYSTECTOMY    . COLONOSCOPY  11/23/2007, 05/12/1994  . COLONOSCOPY WITH PROPOFOL N/A 03/31/2018   Procedure: COLONOSCOPY WITH PROPOFOL;  Surgeon: Manya Silvas, MD;  Location: [facility] ENDOSCOPY;  Service: Endoscopy;  Laterality: N/A;  .  ESOPHAGOGASTRODUODENOSCOPY  09/03/2009, 10/14/2008, 09/30/2008, 11/23/2007  . HEMORRHOID SURGERY    . LIVER BIOPSY  01/27/1996  . PORTOCAVAL SHUNT PLACEMENT     and removal    Prior to Admission medications   Medication Sig Start Date End Date Taking? Authorizing Provider  albuterol (PROVENTIL HFA;VENTOLIN HFA) 108 (90 Base) MCG/ACT inhaler Inhale 1-2 puffs into the lungs every 6 (six) hours as needed for wheezing or shortness of breath. 10/08/17   Salary, Avel Peace, MD  albuterol (PROVENTIL) (2.5 MG/3ML) 0.083% nebulizer solution Take 3 mLs (2.5 mg total) by nebulization every 6 (six) hours as needed for wheezing or shortness of breath. 08/06/18   Nance Pear, MD  alendronate (FOSAMAX) 70 MG tablet Take 70 mg by mouth once a week.  03/26/15   [provider]  budesonide-formoterol (SYMBICORT) 160-4.5 MCG/ACT inhaler Inhale 2 puffs into the lungs 2 (two) times daily. 10/08/17   Salary, Avel Peace, MD  calcium-vitamin D (CALCIUM 500/D) 500-200 MG-UNIT per tablet Take 1 tablet by mouth daily with breakfast.     [provider]  cetirizine (ZYRTEC) 10 MG tablet TAKE 1 TABLET BY MOUTH DAILY FOR ALLERGIES 12/04/14   [provider]  citalopram (CELEXA) 20 MG tablet Take 1 tablet (20 mg total) by mouth daily. 10/09/17   Salary, Avel Peace, MD  Cyanocobalamin (VITAMIN B-12 CR) 1000 MCG TBCR Take 1 tablet by mouth daily.     [provider]  entecavir (BARACLUDE) 0.5 MG tablet Take 4 mg by mouth 2 (two) times daily.  08/06/14   [provider]  furosemide (LASIX) 20 MG tablet Take 20 mg by mouth daily. 07/22/18   [provider]  gabapentin (NEURONTIN) 300 MG capsule Take 300 mg by mouth 3 (three) times daily.  08/25/09   [provider]  ipratropium (ATROVENT HFA) 17 MCG/ACT inhaler Inhale 2 puffs into the lungs every 6 (six) hours as needed. 10/08/17   Salary, Holly Bodily D, MD  ipratropium-albuterol (DUONEB) 0.5-2.5 (3) MG/3ML SOLN Take 3 mLs by nebulization  every 6 (six) hours. 10/08/17   Salary, Avel Peace, MD  mirtazapine (REMERON) 15 MG tablet Take 15 mg by mouth at bedtime.     [provider]  Multiple Vitamin (MULTI-VITAMINS) TABS Take 1 tablet by mouth daily.     [provider]  omeprazole (PRILOSEC) 20 MG capsule TK 1 C PO BID 03/17/18   [provider]  tiZANidine (ZANAFLEX) 2 MG tablet Take 2 mg by mouth at bedtime.  07/07/13   [provider]  traZODone (DESYREL) 100 MG tablet Take 1 tablet (100 mg total) by mouth at bedtime. 10/08/17   Salary, Holly Bodily D, MD  warfarin (COUMADIN) 1 MG tablet TAKE ONE TABLET BY MOUTH EVERY DAY AT [facility] 06/29/17   Cammie Sickle, MD    Allergies Penicillins; Prednisone; and Sulfa antibiotics  Family History  Problem Relation Age of Onset  . Cancer Daughter   . Asthma Mother   . Breast cancer Neg Hx     Social History Social History   Tobacco Use  . Smoking status: Never Smoker  . Smokeless tobacco: Never Used  Substance Use Topics  . Alcohol use: No  . Drug use: Never    Review of Systems  Constitutional:  fever/chills Eyes: No visual changes. ENT: No sore throat. Cardiovascular: Mild to moderate chest pain with cough. Respiratory: Denies shortness of breath. Gastrointestinal: No abdominal pain.  No nausea, no vomiting.  No diarrhea.  No constipation. Genitourinary: Negative for dysuria. Musculoskeletal: Negative for back pain. Skin: Negative for rash. Neurological: Negative for headaches, focal weakness   ____________________________________________   PHYSICAL EXAM:  VITAL SIGNS: ED Triage Vitals [10/01/18 0311]  Enc Vitals Group     BP 101/64     Pulse Rate (!) 110     Resp 20     Temp (!) 102.5 F (39.2 C)     Temp Source Oral     SpO2 99 %     Weight      Height      Head Circumference      Peak Flow      Pain Score      Pain Loc      Pain Edu?      Excl. in [place]?     Constitutional: Alert and oriented. Well appearing and in  no acute distress. Eyes: Conjunctivae are normal. Head: Atraumatic. Nose: No congestion/rhinnorhea. Mouth/Throat: Mucous membranes are moist.  Oropharynx non-erythematous. Neck: No stridor.   Cardiovascular: Slightly rapid rate, regular rhythm. Grossly normal heart sounds.  Good peripheral circulation. Respiratory: Normal respiratory effort.  No retractions. Lungs CTAB. Gastrointestinal: Soft and nontender. No distention. No abdominal bruits. No CVA tenderness. Musculoskeletal: No lower extremity tenderness nor edema.  Neurologic:  Normal speech and language. No gross focal neurologic deficits are appreciated.  Skin:  Skin is warm, dry and intact. No rash noted.   ____________________________________________   LABS (all labs ordered are listed, but only abnormal results are displayed)  Labs Reviewed  COMPREHENSIVE METABOLIC PANEL - Abnormal; Notable for the following components:      Result Value   Sodium 128 (*)    Potassium 2.8 (*)    Chloride 94 (*)    CO2 21 (*)    Glucose, Bld 146 (*)    Creatinine, Ser 1.66 (*)    Calcium 7.4 (*)    Albumin 3.1 (*)    GFR calc non Af Amer 31 (*)    GFR calc Af Amer 36 (*)    All other components within normal limits  CBC WITH DIFFERENTIAL/PLATELET - Abnormal; Notable for the following components:   WBC 13.3 (*)    RBC 3.28 (*)    Hemoglobin 10.4 (*)    HCT 29.6 (*)    Neutro Abs 11.6 (*)    Lymphs Abs 0.5 (*)    Abs Immature Granulocytes 0.28 (*)    All other components within normal limits  PROTIME-INR - Abnormal; Notable for the following components:   Prothrombin Time 15.7 (*)    INR 1.3 (*)    All other components within normal limits  CULTURE, BLOOD (ROUTINE X 2)  CULTURE, BLOOD (ROUTINE X 2)  NOVEL CORONAVIRUS, NAA (HOSPITAL ORDER, SEND-OUT TO REF LAB)  RESPIRATORY PANEL BY PCR  LACTIC ACID, PLASMA  INFLUENZA PANEL BY PCR (TYPE A & B)  TSH  LACTIC ACID, PLASMA  URINALYSIS, ROUTINE W REFLEX MICROSCOPIC  HEMOGLOBIN A1C    ____________________________________________  EKG   ____________________________________________  RADIOLOGY  ED MD interpretation: Chest x-ray read by radiology reviewed by me shows left upper lobe infiltrate.  Official radiology report(s): Dg Chest Portable 1 View  Result Date: 10/01/2018 CLINICAL DATA:  Cough and fever EXAM: PORTABLE CHEST 1 VIEW COMPARISON:  08/06/2018 FINDINGS: There is consolidation in the left upper lobe. Right chest wall Port-A-Cath position is unchanged. The right lung is clear. Normal cardiomediastinal contours. IMPRESSION: Left upper lobe consolidation, likely pneumonia. Electronically Signed   By: Ulyses Jarred M.D.   On: 10/01/2018 04:20    ____________________________________________   PROCEDURES  Procedure(s) performed (including Critical Care):  Procedures   ____________________________________________   INITIAL IMPRESSION / ASSESSMENT AND PLAN / ED COURSE  Patient has had 1 L of fluid and Levaquin and her blood pressures dropped somewhat.  We will give her second liter fluid monitor her carefully.  Plan on getting her in the hospital.             ____________________________________________   FINAL CLINICAL IMPRESSION(S) / ED DIAGNOSES  Final diagnoses:  Community acquired pneumonia of left upper lobe of lung ([place])  Sepsis, due to unspecified organism, unspecified whether acute organ dysfunction present [facility])     ED Discharge Orders    None       Note:  This document was prepared using Dragon voice recognition software and may include unintentional dictation errors.    Nena Polio, MD 10/01/18 [phone]

## 2018-10-01 NOTE — Progress Notes (Signed)
CODE SEPSIS - PHARMACY COMMUNICATION  **Broad Spectrum Antibiotics should be administered within 1 hour of Sepsis diagnosis**  Time Code Sepsis Called/Page Received: @ [phone]  Antibiotics Ordered: levofloxacin   Time of 1st antibiotic administration: @ 0400  Additional action taken by pharmacy: Spoke with nurse @ [phone] about time left to start Abx for sepsis   Pernell Dupre, PharmD, BCPS Clinical Pharmacist 10/01/2018 4:10 AM

## 2018-10-01 NOTE — Consult Note (Addendum)
ANTICOAGULATION CONSULT NOTE - Initial Consult  Pharmacy Consult for Warfarin Dosing and Monitoring  Indication: atrial fibrillation  Allergies  Allergen Reactions  . Penicillins Rash    Has patient had a PCN reaction causing immediate rash, facial/tongue/throat swelling, SOB or lightheadedness with hypotension: Yes Has patient had a PCN reaction causing severe rash involving mucus membranes or skin necrosis: No Has patient had a PCN reaction that required hospitalization: No Has patient had a PCN reaction occurring within the last 10 years: No If all of the above answers are "NO", then may proceed with Cephalosporin use.  . Prednisone Hives  . Sulfa Antibiotics Hives and Shortness Of Breath    Patient Measurements: Weight: 183 lb (83 kg)  Vital Signs: Temp: 98.7 F (37.1 C) (04/05 0441) Temp Source: Oral (04/05 0441) BP: 96/55 (04/05 0430) Pulse Rate: 100 (04/05 0430)  Labs: Recent Labs    10/01/18 0328  HGB 10.4*  HCT 29.6*  PLT 188  LABPROT 15.7*  INR 1.3*  CREATININE 1.66*    Estimated Creatinine Clearance: 32.4 mL/min (A) (by C-G formula based on SCr of 1.66 mg/dL (H)).   Medical History: Past Medical History:  Diagnosis Date  . Acute URI   . Allergy   . Anxiety   . Asthma   . Cancer ([place])    lymphoma in remission  . Diverticulosis   . DVT of upper extremity (deep vein thrombosis) ([place]) 07/2008 & 12/2007  . Gastric ulcer 10/2007   EGD  . Hepatitis B    treated  . Hypertension   . Lower extremity edema   . Lymphoma ([place])    Stage 3 large B cell lymphoma    Assessment: Pharmacy consulted for warfarin dosing and monitoring for 73 yo female with PMH of A. Fib. INR on admission is subtherapeutic  @ 1.3. Patient is receiving levofloxacin.   Home Regimen: Warfarin 1mg  daily   DATE INR DOSE 4/5 1.3  Goal of Therapy:  INR 2-3 Monitor platelets by anticoagulation protocol: Yes   Plan:  4/5 Will order warfarin 1.5mg  x 1 dose now. Patient is  receiving levofloxacin which can increase INR. Will check INRs daily while on abx per protocol.  Next INR ordered 4/6 w/AM labs.  Pharmacy will continue to monitor and order/adjsut warfarin doses as needed.   Pernell Dupre, PharmD, BCPS Clinical Pharmacist 10/01/2018 5:06 AM

## 2018-10-01 NOTE — H&P (Signed)
Erin [name] is an 73 y.o. female.   Chief Complaint: Cough HPI: The patient with past medical history of large B-cell lymphoma currently in remission, hypertension, hepatitis B and asthma presents to the emergency department complaining of nonproductive cough.  The patient reports that she has had a cough for approximately 2 weeks but developed fever 2 days ago.  She feels generally weak. No travel risk factors or known contact with individuals with exposure to novel coronavirus.  The patient was placed on supplemental oxygen via nasal cannula upon arrival.  Chest x-ray showed left upper lobe pneumonia.  She was found to meet criteria for sepsis which prompted initiation of sepsis protocol prior to the emergency department staff calling the hospitalist service for admission.   Past Medical History:  Diagnosis Date  . Acute URI   . Allergy   . Anxiety   . Asthma   . Cancer ([place])    lymphoma in remission  . Diverticulosis   . DVT of upper extremity (deep vein thrombosis) ([place]) 07/2008 & 12/2007  . Gastric ulcer 10/2007   EGD  . Hepatitis B    treated  . Hypertension   . Lower extremity edema   . 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 CHOLECYSTECTOMY    . COLONOSCOPY  11/23/2007, 05/12/1994  . COLONOSCOPY WITH PROPOFOL N/A 03/31/2018   Procedure: COLONOSCOPY WITH PROPOFOL;  Surgeon: Manya Silvas, MD;  Location: [facility] ENDOSCOPY;  Service: Endoscopy;  Laterality: N/A;  . ESOPHAGOGASTRODUODENOSCOPY  09/03/2009, 10/14/2008, 09/30/2008, 11/23/2007  . HEMORRHOID SURGERY    . LIVER BIOPSY  01/27/1996  . PORTOCAVAL SHUNT PLACEMENT     and removal    Family History  Problem Relation Age of Onset  . Cancer Daughter   . Asthma Mother   . Breast cancer Neg Hx    Social History:  reports that she has never smoked. She has never used smokeless tobacco. She reports that she does not drink alcohol. No history on file for  drug.  Allergies:  Allergies  Allergen Reactions  . Penicillins Rash    Has patient had a PCN reaction causing immediate rash, facial/tongue/throat swelling, SOB or lightheadedness with hypotension: Yes Has patient had a PCN reaction causing severe rash involving mucus membranes or skin necrosis: No Has patient had a PCN reaction that required hospitalization: No Has patient had a PCN reaction occurring within the last 10 years: No If all of the above answers are "NO", then may proceed with Cephalosporin use.  . Prednisone Hives  . Sulfa Antibiotics Hives and Shortness Of Breath    Facility-Administered Medications Prior to Admission  Medication Dose Route Frequency Provider Last Rate Last Dose  . heparin lock flush 100 unit/mL  500 Units Intravenous Once Lucendia Herrlich, NP       Medications Prior to Admission  Medication Sig Dispense Refill  . albuterol (PROVENTIL HFA;VENTOLIN HFA) 108 (90 Base) MCG/ACT inhaler Inhale 1-2 puffs into the lungs every 6 (six) hours as needed for wheezing or shortness of breath. 1 Inhaler 0  . albuterol (PROVENTIL) (2.5 MG/3ML) 0.083% nebulizer solution Take 3 mLs (2.5 mg total) by nebulization every 6 (six) hours as needed for wheezing or shortness of breath. 75 mL 12  . alendronate (FOSAMAX) 70 MG tablet Take 70 mg by mouth once a week.     . budesonide-formoterol (SYMBICORT) 160-4.5 MCG/ACT inhaler Inhale 2 puffs into the  lungs 2 (two) times daily. 1 Inhaler 0  . calcium-vitamin D (CALCIUM 500/D) 500-200 MG-UNIT per tablet Take 1 tablet by mouth daily with breakfast.     . cetirizine (ZYRTEC) 10 MG tablet TAKE 1 TABLET BY MOUTH DAILY FOR ALLERGIES  11  . citalopram (CELEXA) 20 MG tablet Take 1 tablet (20 mg total) by mouth daily. 90 tablet 0  . Cyanocobalamin (VITAMIN B-12 CR) 1000 MCG TBCR Take 1 tablet by mouth daily.     Marland Kitchen entecavir (BARACLUDE) 0.5 MG tablet Take 4 mg by mouth 2 (two) times daily.     . furosemide (LASIX) 20 MG tablet Take 20 mg  by mouth daily.    Marland Kitchen gabapentin (NEURONTIN) 300 MG capsule Take 300 mg by mouth 3 (three) times daily.     Marland Kitchen ipratropium (ATROVENT HFA) 17 MCG/ACT inhaler Inhale 2 puffs into the lungs every 6 (six) hours as needed. 1 Inhaler 0  . ipratropium-albuterol (DUONEB) 0.5-2.5 (3) MG/3ML SOLN Take 3 mLs by nebulization every 6 (six) hours. 360 mL 0  . mirtazapine (REMERON) 15 MG tablet Take 15 mg by mouth at bedtime.     . Multiple Vitamin (MULTI-VITAMINS) TABS Take 1 tablet by mouth daily.     Marland Kitchen omeprazole (PRILOSEC) 20 MG capsule TK 1 C PO BID  2  . tiZANidine (ZANAFLEX) 2 MG tablet Take 2 mg by mouth at bedtime.     . traZODone (DESYREL) 100 MG tablet Take 1 tablet (100 mg total) by mouth at bedtime. 90 tablet 0  . warfarin (COUMADIN) 1 MG tablet TAKE ONE TABLET BY MOUTH EVERY DAY AT 6PM 90 tablet 0    Results for orders placed or performed during the hospital encounter of 10/01/18 (from the past 48 hour(s))  Lactic acid, plasma     Status: None   Collection Time: 10/01/18  3:28 AM  Result Value Ref Range   Lactic Acid, Venous 1.9 0.5 - 1.9 mmol/L    Comment: Performed at [facility], [address] [place], [place] [address]  Comprehensive metabolic panel     Status: Abnormal   Collection Time: 10/01/18  3:28 AM  Result Value Ref Range   Sodium 128 (L) 135 - 145 mmol/L   Potassium 2.8 (L) 3.5 - 5.1 mmol/L   Chloride 94 (L) 98 - 111 mmol/L   CO2 21 (L) 22 - 32 mmol/L   Glucose, Bld 146 (H) 70 - 99 mg/dL   BUN 17 8 - 23 mg/dL   Creatinine, Ser 1.66 (H) 0.44 - 1.00 mg/dL   Calcium 7.4 (L) 8.9 - 10.3 mg/dL   Total Protein 6.8 6.5 - 8.1 g/dL   Albumin 3.1 (L) 3.5 - 5.0 g/dL   AST 21 15 - 41 U/L   ALT 19 0 - 44 U/L   Alkaline Phosphatase 113 38 - 126 U/L   Total Bilirubin 0.4 0.3 - 1.2 mg/dL   GFR calc non Af Amer 31 (L) >60 mL/min   GFR calc Af Amer 36 (L) >60 mL/min   Anion gap 13 5 - 15    Comment: Performed at [facility], [address] [place], [place]  [address]  CBC WITH DIFFERENTIAL     Status: Abnormal   Collection Time: 10/01/18  3:28 AM  Result Value Ref Range   WBC 13.3 (H) 4.0 - 10.5 K/uL   RBC 3.28 (L) 3.87 - 5.11 MIL/uL   Hemoglobin 10.4 (L) 12.0 - 15.0 g/dL   HCT 29.6 (L)  36.0 - 46.0 %   MCV 90.2 80.0 - 100.0 fL   MCH 31.7 26.0 - 34.0 pg   MCHC 35.1 30.0 - 36.0 g/dL   RDW 12.9 11.5 - 15.5 %   Platelets 188 150 - 400 K/uL   nRBC 0.0 0.0 - 0.2 %   Neutrophils Relative % 88 %   Neutro Abs 11.6 (H) 1.7 - 7.7 K/uL   Lymphocytes Relative 3 %   Lymphs Abs 0.5 (L) 0.7 - 4.0 K/uL   Monocytes Relative 7 %   Monocytes Absolute 1.0 0.1 - 1.0 K/uL   Eosinophils Relative 0 %   Eosinophils Absolute 0.0 0.0 - 0.5 K/uL   Basophils Relative 0 %   Basophils Absolute 0.0 0.0 - 0.1 K/uL   RBC Morphology MORPHOLOGY UNREMARKABLE    Smear Review Normal platelet morphology    Immature Granulocytes 2 %   Abs Immature Granulocytes 0.28 (H) 0.00 - 0.07 K/uL   Dohle Bodies PRESENT     Comment: Performed at [facility], [address] [place], [place] [address]  Influenza panel by PCR (type A & B)     Status: None   Collection Time: 10/01/18  3:28 AM  Result Value Ref Range   Influenza A By PCR NEGATIVE NEGATIVE   Influenza B By PCR NEGATIVE NEGATIVE    Comment: (NOTE) The Xpert Xpress Flu assay is intended as an aid in the diagnosis of  influenza and should not be used as a sole basis for treatment.  This  assay is FDA approved for nasopharyngeal swab specimens only. Nasal  washings and aspirates are unacceptable for Xpert Xpress Flu testing. Performed at [facility], [address] [place], [place] [address]   Protime-INR     Status: Abnormal   Collection Time: 10/01/18  3:28 AM  Result Value Ref Range   Prothrombin Time 15.7 (H) 11.4 - 15.2 seconds   INR 1.3 (H) 0.8 - 1.2    Comment: (NOTE) INR goal varies based on device and disease states. Performed at [facility], [address]  [place],  [address]    Dg Chest Portable 1 View  Result Date: 10/01/2018 CLINICAL DATA:  Cough and fever EXAM: PORTABLE CHEST 1 VIEW COMPARISON:  08/06/2018 FINDINGS: There is consolidation in the left upper lobe. Right chest wall Port-A-Cath position is unchanged. The right lung is clear. Normal cardiomediastinal contours. IMPRESSION: Left upper lobe consolidation, likely pneumonia. Electronically Signed   By: Ulyses Jarred M.D.   On: 10/01/2018 04:20    Review of Systems  Constitutional: Positive for fever. Negative for chills.  HENT: Negative for sore throat and tinnitus.   Eyes: Negative for blurred vision and redness.  Respiratory: Positive for cough and shortness of breath.   Cardiovascular: Negative for chest pain, palpitations, orthopnea and PND.  Gastrointestinal: Negative for abdominal pain, diarrhea, nausea and vomiting.  Genitourinary: Negative for dysuria, frequency and urgency.  Musculoskeletal: Negative for joint pain and myalgias.  Skin: Negative for rash.       No lesions  Neurological: Negative for speech change, focal weakness and weakness.  Endo/Heme/Allergies: Does not bruise/bleed easily.       No temperature intolerance  Psychiatric/Behavioral: Negative for depression and suicidal ideas.    Blood pressure (!) 90/58, pulse (!) 28, temperature 98.7 F (37.1 C), temperature source Oral, resp. rate 20, weight 83 kg, SpO2 99 %. Physical Exam  Vitals reviewed. Constitutional: She is oriented to person, place, and time. She appears  well-developed and well-nourished. No distress.  HENT:  Head: Normocephalic and atraumatic.  Mouth/Throat: Oropharynx is clear and moist.  Eyes: Pupils are equal, round, and reactive to light. Conjunctivae and EOM are normal. No scleral icterus.  Neck: Normal range of motion. Neck supple. No JVD present. No tracheal deviation present. No thyromegaly present.  Cardiovascular: Normal rate, regular rhythm and normal heart sounds. Exam  reveals no gallop and no friction rub.  No murmur heard. Respiratory: Effort normal. She has decreased breath sounds in the left lower field. She has wheezes. She has rhonchi in the left upper field.  GI: Soft. Bowel sounds are normal. She exhibits no distension. There is no abdominal tenderness.  Genitourinary:    Genitourinary Comments: Deferred   Musculoskeletal: Normal range of motion.        General: No edema.  Lymphadenopathy:    She has no cervical adenopathy.  Neurological: She is alert and oriented to person, place, and time. No cranial nerve deficit. She exhibits normal muscle tone.  Skin: Skin is warm and dry. No rash noted. No erythema.  Psychiatric: She has a normal mood and affect. Her behavior is normal. Judgment and thought content normal.     Assessment/Plan This is a 73 year old female admitted for sepsis. 1.  Sepsis: The patient meets criteria via fever, tachycardia, tachypnea and leukocytosis.  She is hemodynamically stable.  Source appears to be lungs.  Follow blood cultures for growth and sensitivities. 2.  Pneumonia: Community-acquired; left upper lobe.  Continue ceftriaxone and azithromycin.  The patient also has some significant wheezing.  Schedule albuterol treatments 3.  Shortness of breath: Constellation of symptoms warrants rule out of COVID-19.  Droplet and contact isolation until cleared 4.  Electrolyte abnormalities: Hyponatremia secondary to pneumonia.  Hydrate with normal saline.  Also replete potassium as she also has hypokalemia. 5.  Hepatitis B: Continue retroviral therapy 6.  DVT prophylaxis: Warfarin due to history of DVT 7.  GI prophylaxis: PPI per home regimen The patient is a full code.  Time spent on admission orders and patient care approximately 45 minutes  Harrie Foreman, MD 10/01/2018, 5:44 AM

## 2018-10-01 NOTE — Progress Notes (Signed)
[facility] [place] NAME: Christia Domke    MR#:  [id number]  DATE OF BIRTH:  28-Sep-1945  SUBJECTIVE:   [name] presented to the hospital due to cough, fever and noted to have sepsis secondary to pneumonia.  [name] also been ruled out for COVID-19.  REVIEW OF SYSTEMS:    Review of Systems  Constitutional: Positive for fever. Negative for chills.  HENT: Negative for congestion and tinnitus.   Eyes: Negative for blurred vision and double vision.  Respiratory: Positive for cough and shortness of breath. Negative for wheezing.   Cardiovascular: Negative for chest pain, orthopnea and PND.  Gastrointestinal: Negative for abdominal pain, diarrhea, nausea and vomiting.  Genitourinary: Negative for dysuria and hematuria.  Neurological: Negative for dizziness, sensory change and focal weakness.  All other systems reviewed and are negative.   Nutrition: Heart Healthy Tolerating Diet: Yes Tolerating PT: Await Eval.   DRUG ALLERGIES:   Allergies  Allergen Reactions  . Penicillins Rash    Has [name] had a PCN reaction causing immediate rash, facial/tongue/throat swelling, SOB or lightheadedness with hypotension: Yes Has [name] had a PCN reaction causing severe rash involving mucus membranes or skin necrosis: No Has [name] had a PCN reaction that required hospitalization: No Has [name] had a PCN reaction occurring within the last 10 years: No If all of the above answers are "NO", then may proceed with Cephalosporin use.  . Prednisone Hives  . Sulfa Antibiotics Hives and Shortness Of Breath    VITALS:  Blood pressure 109/65, pulse 100, temperature 98.6 F (37 C), temperature source Oral, resp. rate 18, height 5\' 4"  (1.626 m), weight 83.8 kg, SpO2 99 %.  PHYSICAL EXAMINATION:   Physical Exam  GENERAL:  Erin [name] lying in bed in no acute distress.  EYES: Pupils equal, round, reactive to light and accommodation. No  scleral icterus. Extraocular muscles intact.  HEENT: Head atraumatic, normocephalic. Oropharynx and nasopharynx clear.  NECK:  Supple, no jugular venous distention. No thyroid enlargement, no tenderness.  LUNGS: Normal breath sounds bilaterally, no wheezing, rales, rhonchi. No use of accessory muscles of respiration.  CARDIOVASCULAR: S1, S2 normal. No murmurs, rubs, or gallops.  ABDOMEN: Soft, nontender, nondistended. Bowel sounds present. No organomegaly or mass.  EXTREMITIES: No cyanosis, clubbing or edema b/l.    NEUROLOGIC: Cranial nerves II through XII are intact. No focal Motor or sensory deficits b/l. PSYCHIATRIC: The [name] is alert and oriented x 3.  SKIN: No obvious rash, lesion, or ulcer.    LABORATORY PANEL:   CBC Recent Labs  Lab 04/05/Erin 0328  WBC 13.3*  HGB 10.4*  HCT 29.6*  PLT 188   ------------------------------------------------------------------------------------------------------------------  Chemistries  Recent Labs  Lab 04/05/Erin 0328  NA 128*  K 2.8*  CL 94*  CO2 21*  GLUCOSE 146*  BUN 17  CREATININE 1.66*  CALCIUM 7.4*  AST 21  ALT 19  ALKPHOS 113  BILITOT 0.4   ------------------------------------------------------------------------------------------------------------------  Cardiac Enzymes No results for input(s): TROPONINI in the last 168 hours. ------------------------------------------------------------------------------------------------------------------  RADIOLOGY:  Dg Chest Portable 1 View  Result Date: 10/01/2018 CLINICAL DATA:  Cough and fever EXAM: PORTABLE CHEST 1 VIEW COMPARISON:  08/06/2018 FINDINGS: There is consolidation in the left upper lobe. Right chest wall Port-A-Cath position is unchanged. The right lung is clear. Normal cardiomediastinal contours. IMPRESSION: Left upper lobe consolidation, likely pneumonia. Electronically Signed   By: Ulyses Jarred M.D.   On: 10/01/2018 04:Erin  ASSESSMENT AND PLAN:    73 year old female with past medical history of hepatitis B, hypertension, anxiety, history of lymphoma, GERD who presented to the hospital due to fever, shortness of breath and cough.  1.  Sepsis-[name] meets criteria given [name]'s fever, leukocytosis and chest x-ray findings suggestive of pneumonia. -Continue treatment for underlying pneumonia with IV Levaquin.  Follow blood, sputum cultures. - [name] also being ruled out for COVID-19.  2.  Suspected COVID-19-[name] presented with fever cough shortness of breath.  She has had no recent travel history of sick contacts. - Continue droplet, contact precautions. -Await COVID-19 results.  O2 requirements are stable.  3.  Hyponatremia/hypokalemia -improving with IV fluid hydration and potassium supplementation. -We will continue to monitor.  Repeat level in morning along with magnesium level.  4.  GERD-continue Protonix.  5.  Depression-continue Celexa.  6. Hx of Hep B - cont. Entecavir  7. Neuropathy - cont. Neurontin.   8. Hx of previous DVT - cont. Coumadin.     All the records are reviewed and case discussed with Care Management/Social Worker. Management plans discussed with the [name], family and they are in agreement.  CODE STATUS: Full code  DVT Prophylaxis: Coumadin  TOTAL TIME TAKING CARE OF THIS [name]: 30 minutes.   POSSIBLE D/C IN 2-3 DAYS, DEPENDING ON CLINICAL CONDITION.   Henreitta Leber M.D on 10/01/2018 at 12:03 PM  Between 7am to 6pm - Pager - [phone]  After 6pm go to [email or web address] - Technical brewer [facility]  Office  [phone]  CC: Primary care physician; White, [place] J, DO

## 2018-10-01 NOTE — ED Triage Notes (Signed)
Pt arrived via EMS from home where pt reports cough x2 weeks, fever 2 days. Pt has hx/o asthma. Pt denies travel. Pt last took tylenol x4 hours ago.

## 2018-10-01 NOTE — Plan of Care (Signed)
  Problem: Clinical Measurements: Goal: Ability to maintain clinical measurements within normal limits will improve Outcome: Progressing Goal: Respiratory complications will improve Outcome: Progressing   Problem: Pain Managment: Goal: General experience of comfort will improve Outcome: Progressing   Problem: Safety: Goal: Ability to remain free from injury will improve Outcome: Progressing   Problem: Skin Integrity: Goal: Risk for impaired skin integrity will decrease Outcome: Progressing

## 2018-10-01 NOTE — Progress Notes (Signed)
PHARMACY - PHYSICIAN COMMUNICATION CRITICAL VALUE ALERT - BLOOD CULTURE IDENTIFICATION (BCID)  Erin [name] is an 73 y.o. female who presented to [facility] on 10/01/2018 with a chief complaint of Cough/CAP  Assessment: Blood culture 2 out of 4 bottles both anaerobic were positive for Streptococcus sp. and Streptococcus pneumoniae. Pt was called about PCN allergy and she stated the allergy was 10 years ago and it was just a rash, nothing more serious.   Name of physician (or Provider) Contacted: Dr. Estanislado Pandy   Current antibiotics: Levofloxacin 750 mg q48H IV   Changes to prescribed antibiotics recommended: Ceftriaxone 2 g q24H  Recommendations accepted by provider  Results for orders placed or performed during the hospital encounter of 10/01/18  Blood Culture ID Panel (Reflexed) (Collected: 10/01/2018  3:28 AM)  Result Value Ref Range   Enterococcus species NOT DETECTED NOT DETECTED   Listeria monocytogenes NOT DETECTED NOT DETECTED   Staphylococcus species NOT DETECTED NOT DETECTED   Staphylococcus aureus (BCID) NOT DETECTED NOT DETECTED   Streptococcus species DETECTED (A) NOT DETECTED   Streptococcus agalactiae NOT DETECTED NOT DETECTED   Streptococcus pneumoniae DETECTED (A) NOT DETECTED   Streptococcus pyogenes NOT DETECTED NOT DETECTED   Acinetobacter baumannii NOT DETECTED NOT DETECTED   Enterobacteriaceae species NOT DETECTED NOT DETECTED   Enterobacter cloacae complex NOT DETECTED NOT DETECTED   Escherichia coli NOT DETECTED NOT DETECTED   Klebsiella oxytoca NOT DETECTED NOT DETECTED   Klebsiella pneumoniae NOT DETECTED NOT DETECTED   Proteus species NOT DETECTED NOT DETECTED   Serratia marcescens NOT DETECTED NOT DETECTED   Haemophilus influenzae NOT DETECTED NOT DETECTED   Neisseria meningitidis NOT DETECTED NOT DETECTED   Pseudomonas aeruginosa NOT DETECTED NOT DETECTED   Candida albicans NOT DETECTED NOT DETECTED   Candida glabrata NOT DETECTED NOT DETECTED   Candida krusei NOT DETECTED NOT DETECTED   Candida parapsilosis NOT DETECTED NOT DETECTED   Candida tropicalis NOT DETECTED NOT DETECTED    Oswald Hillock, PharmD, BCPS 10/01/2018  6:57 PM

## 2018-10-02 LAB — BASIC METABOLIC PANEL
Anion gap: 5 (ref 5–15)
BUN: 17 mg/dL (ref 8–23)
CO2: 20 mmol/L — ABNORMAL LOW (ref 22–32)
Calcium: 6.9 mg/dL — ABNORMAL LOW (ref 8.9–10.3)
Chloride: 117 mmol/L — ABNORMAL HIGH (ref 98–111)
Creatinine, Ser: 1.9 mg/dL — ABNORMAL HIGH (ref 0.44–1.00)
GFR calc Af Amer: 30 mL/min — ABNORMAL LOW (ref >60)
GFR calc non Af Amer: 26 mL/min — ABNORMAL LOW (ref >60)
Glucose, Bld: 119 mg/dL — ABNORMAL HIGH (ref 70–99)
Potassium: 3.7 mmol/L (ref 3.5–5.1)
Sodium: 142 mmol/L (ref 135–145)

## 2018-10-02 LAB — NOVEL CORONAVIRUS, NAA (HOSP ORDER, SEND-OUT TO REF LAB; TAT 18-24 HRS): SARS-CoV-2, NAA: NOT DETECTED

## 2018-10-02 LAB — PROTIME-INR
INR: 1.5 — ABNORMAL HIGH (ref 0.8–1.2)
Prothrombin Time: 18 seconds — ABNORMAL HIGH (ref 11.4–15.2)

## 2018-10-02 LAB — MAGNESIUM: Magnesium: 2.4 mg/dL (ref 1.7–2.4)

## 2018-10-02 MED ORDER — GABAPENTIN 300 MG PO CAPS
300.0000 mg | ORAL_CAPSULE | Freq: Two times a day (BID) | ORAL | Status: DC
  Administered 2018-10-02 – 2018-10-05 (×6): 300 mg via ORAL
  Filled 2018-10-02 (×6): qty 1

## 2018-10-02 MED ORDER — TENOFOVIR DISOPROXIL FUMARATE 300 MG PO TABS
300.0000 mg | ORAL_TABLET | ORAL | Status: DC
  Administered 2018-10-02: 300 mg via ORAL
  Filled 2018-10-02 (×3): qty 1

## 2018-10-02 MED ORDER — SODIUM CHLORIDE 0.9 % IV SOLN
500.0000 mg | INTRAVENOUS | Status: DC
  Administered 2018-10-02: 500 mg via INTRAVENOUS
  Filled 2018-10-02 (×2): qty 500

## 2018-10-02 MED ORDER — METHOCARBAMOL 500 MG PO TABS
500.0000 mg | ORAL_TABLET | Freq: Three times a day (TID) | ORAL | Status: AC
  Administered 2018-10-02 – 2018-10-04 (×5): 500 mg via ORAL
  Filled 2018-10-02 (×6): qty 1

## 2018-10-02 MED ORDER — ALBUTEROL SULFATE HFA 108 (90 BASE) MCG/ACT IN AERS
1.0000 | INHALATION_SPRAY | Freq: Four times a day (QID) | RESPIRATORY_TRACT | Status: DC | PRN
  Filled 2018-10-02: qty 6.7

## 2018-10-02 MED ORDER — ALBUTEROL SULFATE HFA 108 (90 BASE) MCG/ACT IN AERS
2.0000 | INHALATION_SPRAY | Freq: Four times a day (QID) | RESPIRATORY_TRACT | Status: DC | PRN
  Filled 2018-10-02: qty 6.7

## 2018-10-02 MED ORDER — WARFARIN SODIUM 2 MG PO TABS
2.0000 mg | ORAL_TABLET | Freq: Once | ORAL | Status: AC
  Administered 2018-10-02: 2 mg via ORAL
  Filled 2018-10-02: qty 1

## 2018-10-02 NOTE — Consult Note (Signed)
ANTICOAGULATION CONSULT NOTE - Initial Consult  Pharmacy Consult for Warfarin Dosing and Monitoring  Indication: atrial fibrillation  Allergies  Allergen Reactions  . Penicillins Rash    Has patient had a PCN reaction causing immediate rash, facial/tongue/throat swelling, SOB or lightheadedness with hypotension: Yes Has patient had a PCN reaction causing severe rash involving mucus membranes or skin necrosis: No Has patient had a PCN reaction that required hospitalization: No Has patient had a PCN reaction occurring within the last 10 years: No If all of the above answers are "NO", then may proceed with Cephalosporin use.  . Prednisone Hives  . Sulfa Antibiotics Hives and Shortness Of Breath    Patient Measurements: Height: 5\' 4"  (162.6 cm) Weight: 184 lb 15.5 oz (83.9 kg) IBW/kg (Calculated) : 54.7  Vital Signs: Temp: 99.1 F (37.3 C) (04/06 0502) BP: 94/54 (04/06 0502) Pulse Rate: 103 (04/06 0525)  Labs: Recent Labs    10/01/18 0328 10/02/18 0357  HGB 10.4*  --   HCT 29.6*  --   PLT 188  --   LABPROT 15.7* 18.0*  INR 1.3* 1.5*  CREATININE 1.66* 1.90*    Estimated Creatinine Clearance: 28.5 mL/min (A) (by C-G formula based on SCr of 1.9 mg/dL (H)).   Medical History: Past Medical History:  Diagnosis Date  . Acute URI   . Allergy   . Anxiety   . Asthma   . Cancer ([place])    lymphoma in remission  . Diverticulosis   . DVT of upper extremity (deep vein thrombosis) ([place]) 07/2008 & 12/2007  . Gastric ulcer 10/2007   EGD  . Hepatitis B    treated  . Hypertension   . Lower extremity edema   . Lymphoma ([place])    Stage 3 large B cell lymphoma    Assessment: Pharmacy consulted for warfarin dosing and monitoring for 73 yo female with PMH of A. Fib. INR on admission is subtherapeutic  @ 1.3 and subsequently received 1.5 mg dose yesterday morning. Patient was on levofloxacin and will start Ceftriaxone today; both antibiotics could increase her INR.   Home Regimen:  Warfarin 1mg  daily   DATE INR DOSE 4/5 1.3 1.5 4/6       1.5  Goal of Therapy:  INR 2-3 Monitor platelets by anticoagulation protocol: Yes   Plan:  4/5 Will order warfarin 2 mg x 1 dose for 1800. Will check INRs daily while on abx per protocol.  Next INR ordered 4/7 w/AM labs.  Pharmacy will continue to monitor and order/adjsut warfarin doses as needed.   Rowland Lathe, PharmD Clinical Pharmacist 10/02/2018 7:46 AM

## 2018-10-02 NOTE — Progress Notes (Signed)
Patient care taken over at this time by this RN.  Patient negative for Covid and placed in regular room.  Oriented to surroundings.

## 2018-10-02 NOTE — Progress Notes (Signed)
[facility] [facility] [place] NAME: Natayah Warmack    MR#:  [id number]  DATE OF BIRTH:  1946-06-27  SUBJECTIVE:  CHIEF COMPLAINT:   Chief Complaint  Patient presents with  . Cough  . Fever   Still complains of generalized weakness.  No fevers today.  Still has cough and left-sided chest pain on deep breaths  REVIEW OF SYSTEMS:  Review of Systems  Constitutional: Positive for malaise/fatigue. Negative for chills and fever.  HENT: Negative for congestion, ear discharge, hearing loss and nosebleeds.   Respiratory: Positive for cough and shortness of breath. Negative for wheezing.   Cardiovascular: Positive for chest pain. Negative for palpitations.  Gastrointestinal: Negative for abdominal pain, constipation, diarrhea, nausea and vomiting.  Genitourinary: Negative for dysuria.  Musculoskeletal: Negative for myalgias.  Neurological: Negative for dizziness, focal weakness, seizures, weakness and headaches.  Psychiatric/Behavioral: Negative for depression.    DRUG ALLERGIES:   Allergies  Allergen Reactions  . Penicillins Rash    Has patient had a PCN reaction causing immediate rash, facial/tongue/throat swelling, SOB or lightheadedness with hypotension: Yes Has patient had a PCN reaction causing severe rash involving mucus membranes or skin necrosis: No Has patient had a PCN reaction that required hospitalization: No Has patient had a PCN reaction occurring within the last 10 years: No If all of the above answers are "NO", then may proceed with Cephalosporin use.  . Prednisone Hives  . Sulfa Antibiotics Hives and Shortness Of Breath    VITALS:  Blood pressure 104/60, pulse 100, temperature 98.5 F (36.9 C), temperature source Oral, resp. rate 18, height 5\' 4"  (1.626 m), weight 83.9 kg, SpO2 97 %.  PHYSICAL EXAMINATION:  Physical Exam   GENERAL:  73 y.o.-year-old elderly patient lying in the bed with no acute distress.  EYES: Pupils  equal, round, reactive to light and accommodation. No scleral icterus. Extraocular muscles intact.  HEENT: Head atraumatic, normocephalic. Oropharynx and nasopharynx clear.  NECK:  Supple, no jugular venous distention. No thyroid enlargement, no tenderness.  LUNGS: Normal breath sounds bilaterally, no wheezing, rales,rhonchi or crepitation. No use of accessory muscles of respiration.  Decreased left basilar breath sounds CARDIOVASCULAR: S1, S2 normal. No murmurs, rubs, or gallops.  ABDOMEN: Soft, nontender, nondistended. Bowel sounds present. No organomegaly or mass.  EXTREMITIES: No pedal edema, cyanosis, or clubbing.  NEUROLOGIC: Cranial nerves II through XII are intact. Muscle strength 5/5 in all extremities. Sensation intact. Gait not checked.  Global weakness noted PSYCHIATRIC: The patient is alert and oriented x 3.  SKIN: No obvious rash, lesion, or ulcer.    LABORATORY PANEL:   CBC Recent Labs  Lab 10/01/18 0328  WBC 13.3*  HGB 10.4*  HCT 29.6*  PLT 188   ------------------------------------------------------------------------------------------------------------------  Chemistries  Recent Labs  Lab 10/01/18 0328 10/02/18 0357  NA 128* 142  K 2.8* 3.7  CL 94* 117*  CO2 21* 20*  GLUCOSE 146* 119*  BUN 17 17  CREATININE 1.66* 1.90*  CALCIUM 7.4* 6.9*  MG  --  2.4  AST 21  --   ALT 19  --   ALKPHOS 113  --   BILITOT 0.4  --    ------------------------------------------------------------------------------------------------------------------  Cardiac Enzymes No results for input(s): TROPONINI in the last 168 hours. ------------------------------------------------------------------------------------------------------------------  RADIOLOGY:  Dg Chest Portable 1 View  Result Date: 10/01/2018 CLINICAL DATA:  Cough and fever EXAM: PORTABLE CHEST 1 VIEW COMPARISON:  08/06/2018 FINDINGS: There is consolidation in the left upper lobe. Right  chest wall Port-A-Cath  position is unchanged. The right lung is clear. Normal cardiomediastinal contours. IMPRESSION: Left upper lobe consolidation, likely pneumonia. Electronically Signed   By: Ulyses Jarred M.D.   On: 10/01/2018 04:20    EKG:   Orders placed or performed during the hospital encounter of 10/01/18  . ED EKG 12-Lead  . ED EKG 12-Lead    ASSESSMENT AND PLAN:   73 year old female with past medical history of hepatitis B, hypertension, anxiety, history of lymphoma, GERD who presented to the hospital due to fever, shortness of breath and cough.  1.  Sepsis- secondary to community-acquired pneumonia. -Negative for COVID-19. -Has pleuritic chest pain secondary to pneumonia.  Muscle relaxants added -Negative blood cultures.  Continue Rocephin. -Off oxygen  2.  GERD-continue Protonix.  3.  Depression-continue Celexa and Remeron.  4. Hx of Hep B - cont. Entecavir-changed to tenofovir in the hospital  5. Neuropathy - cont. Neurontin.   6. Hx of previous DVT - cont. Coumadin.   7.  Hypokalemia and hyponatremia-replaced and normalized now  8.  CKD stage III-continue to monitor with fluids.  Ambulate with physical therapy   All the records are reviewed and case discussed with Care Management/Social Workerr. Management plans discussed with the patient, family and they are in agreement.  CODE STATUS: Full code  TOTAL TIME TAKING CARE OF THIS PATIENT: 38 minutes.   POSSIBLE D/C IN 1-2 DAYS, DEPENDING ON CLINICAL CONDITION.   Gladstone Lighter M.D on 10/02/2018 at 2:46 PM  Between 7am to 6pm - Pager - [phone]  After 6pm go to [email or web address] - password [email or web address] [place] [facility]  Office  [phone]  CC: Primary care physician; White, [place] J, DO

## 2018-10-02 NOTE — Evaluation (Signed)
Physical Therapy Evaluation Patient Details Name: Erin [name] MRN: [id number] DOB: 1946-02-26 Today's Date: 10/02/2018   History of Present Illness  Patient presented from private residence apartment via EMS to hospital ER on 08/31/2018 complaining of cough 2-3 weeks and fever last 2 days. She was diagnosed with pneumonia of left upper lobe of lung and sepsis and admitted to the hospital for treatment. Relevant PMH include hostiry of large B-cell lymphoma currentlyin remission, HTN, hepatitis B, anxiety, diverticulosis, neuropathy, depression, GERD, DVT of upper extremmity, cholecystectomy. Patient tested and found negative for COVID19. Chest radiographs show changes consistent wiht left upper lobe pneumonia.     Clinical Impression  Prior to hospitalization patient was I with all aspects of mobility and care. Upon physical therapy evaluation, pt was I with bed mobility, required supervision for transfers, and contact guard with ambulation. Pt ambulated 200 feet with contact guard assistance for safety. She fatigued quickly with functional mobility and required one short standing rest break to check O2 sat during ambulation. Her vitals were 94% on RA with HR of 114 bpm after ambulation. Pt demo shortness of breath so was instructed in pursed lip breathing. Patient would benefit from physical therapy to improve her functional activity tolerance and return to PLOF or maximum independence.     Follow Up Recommendations Home health PT    Equipment Recommendations  None recommended by PT    Recommendations for Other Services       Precautions / Restrictions Precautions Precautions: Fall Restrictions Weight Bearing Restrictions: No      Mobility  Bed Mobility Overal bed mobility: Independent             General bed mobility comments: supine to sit without assistance  Transfers Overall transfer level: Needs assistance Equipment used: None Transfers: Sit to/from Stand Sit to  Stand: Supervision         General transfer comment: sit <> stand x2 trials with supervision.   Ambulation/Gait Ambulation/Gait assistance: Min guard Gait Distance (Feet): 200 Feet Assistive device: None Gait Pattern/deviations: Decreased stride length;Step-through pattern     General Gait Details: slow pace, reaches for counters at times but no loss of balance.  Stairs            Wheelchair Mobility    Modified Rankin (Stroke Patients Only)       Balance Overall balance assessment: Needs assistance Sitting-balance support: No upper extremity supported Sitting balance-[name] Scale: Normal Sitting balance - Comments: appears WNL   Standing balance support: No upper extremity supported Standing balance-[name] Scale: Good Standing balance comment: pt reaches for counters when close but maintains balance without UE support otherwise.                              Pertinent Vitals/Pain Pain Assessment: 0-10 Pain Score: 6  Pain Location: left chest Pain Descriptors / Indicators: Aching Pain Intervention(s): Monitored during session    Home Living Family/patient expects to be discharged to:: Private residence Living Arrangements: Alone Available Help at Discharge: Friend(s);Other (Comment);Family;Available PRN/intermittently(occasionally, may not be reliable based on varied response of pt) Type of Home: Apartment([address] (Public housing)) Home Access: Level entry     Home Layout: One level Home Equipment: Shower seat Additional Comments: pt reports no equipment    Prior Function Level of Independence: Independent         Comments: I with all mobility and care. Reports cannot drive because she does not currently have  a care. Must take breaks while grocery shopping but does not use power buggy.     Hand Dominance        Extremity/Trunk Assessment   Upper Extremity Assessment Upper Extremity Assessment: Generalized weakness     Lower Extremity Assessment Lower Extremity Assessment: Generalized weakness    Cervical / Trunk Assessment Cervical / Trunk Assessment: Normal  Communication   Communication: No difficulties  Cognition Arousal/Alertness: Awake/alert Behavior During Therapy: WFL for tasks assessed/performed Overall Cognitive Status: Within Functional Limits for tasks assessed                                 General Comments: mood seems low      General Comments      Exercises Other Exercises Other Exercises: pursed lip breathing during and after ambulation and transfers.    Assessment/Plan    PT Assessment    PT Problem List Decreased strength;Decreased mobility;Decreased activity tolerance;Cardiopulmonary status limiting activity;Decreased balance;Decreased knowledge of use of DME;Pain       PT Treatment Interventions DME instruction;Functional mobility training;Balance training;Patient/family education;Gait training;Therapeutic activities;Neuromuscular re-education;Stair training;Therapeutic exercise    PT Goals (Current goals can be found in the Care Plan section)  Acute Rehab PT Goals Patient Stated Goal: feel better and return home PT Goal Formulation: With patient Time For Goal Achievement: 10/30/18 Potential to Achieve Goals: Good    Frequency Min 2X/week   Barriers to discharge Decreased caregiver support      Co-evaluation               AM-PAC PT "6 Clicks" Mobility  Outcome Measure Help needed turning from your back to your side while in a flat bed without using bedrails?: None Help needed moving from lying on your back to sitting on the side of a flat bed without using bedrails?: None Help needed moving to and from a bed to a chair (including a wheelchair)?: None Help needed standing up from a chair using your arms (e.g., wheelchair or bedside chair)?: None Help needed to walk in hospital room?: A Little Help needed climbing 3-5 steps with a  railing? : Total 6 Click Score: 20    End of Session Equipment Utilized During Treatment: Gait belt Activity Tolerance: Patient tolerated treatment well;Patient limited by fatigue;Patient limited by pain Patient left: in chair;with call bell/phone within reach;with chair alarm set Nurse Communication: Mobility status(results of session) PT Visit Diagnosis: Muscle weakness (generalized) (M62.81);Difficulty in walking, not elsewhere classified (R26.2)    Time: 4944-9675 PT Time Calculation (min) (ACUTE ONLY): 27 min   Charges:   PT Evaluation $PT Eval Low Complexity: 1 Low PT Treatments $Gait Training: 8-22 mins        Everlean Alstrom. Graylon Good, PT, DPT 10/02/18, 4:21 PM

## 2018-10-02 NOTE — Plan of Care (Signed)
  Problem: Respiratory: Goal: Ability to maintain adequate ventilation will improve 10/02/2018 0021 by Tawni Millers, RN Outcome: Progressing

## 2018-10-03 ENCOUNTER — Inpatient Hospital Stay: Payer: Medicare Other

## 2018-10-03 LAB — CBC
HCT: 27.1 % — ABNORMAL LOW (ref 36.0–46.0)
Hemoglobin: 9.1 g/dL — ABNORMAL LOW (ref 12.0–15.0)
MCH: 31.3 pg (ref 26.0–34.0)
MCHC: 33.6 g/dL (ref 30.0–36.0)
MCV: 93.1 fL (ref 80.0–100.0)
Platelets: 213 10*3/uL (ref 150–400)
RBC: 2.91 MIL/uL — ABNORMAL LOW (ref 3.87–5.11)
RDW: 14 % (ref 11.5–15.5)
WBC: 12.4 10*3/uL — ABNORMAL HIGH (ref 4.0–10.5)
nRBC: 0 % (ref 0.0–0.2)

## 2018-10-03 LAB — BASIC METABOLIC PANEL
Anion gap: 6 (ref 5–15)
BUN: 15 mg/dL (ref 8–23)
CO2: 19 mmol/L — ABNORMAL LOW (ref 22–32)
Calcium: 7.6 mg/dL — ABNORMAL LOW (ref 8.9–10.3)
Chloride: 120 mmol/L — ABNORMAL HIGH (ref 98–111)
Creatinine, Ser: 1.61 mg/dL — ABNORMAL HIGH (ref 0.44–1.00)
GFR calc Af Amer: 37 mL/min — ABNORMAL LOW (ref >60)
GFR calc non Af Amer: 32 mL/min — ABNORMAL LOW (ref >60)
Glucose, Bld: 97 mg/dL (ref 70–99)
Potassium: 3.7 mmol/L (ref 3.5–5.1)
Sodium: 145 mmol/L (ref 135–145)

## 2018-10-03 LAB — CULTURE, BLOOD (ROUTINE X 2)
Special Requests: ADEQUATE
Special Requests: ADEQUATE

## 2018-10-03 LAB — PROTIME-INR
INR: 1.4 — ABNORMAL HIGH (ref 0.8–1.2)
Prothrombin Time: 17 seconds — ABNORMAL HIGH (ref 11.4–15.2)

## 2018-10-03 MED ORDER — POTASSIUM CHLORIDE IN NACL 20-0.9 MEQ/L-% IV SOLN
INTRAVENOUS | Status: DC
  Administered 2018-10-03 – 2018-10-04 (×2): via INTRAVENOUS
  Filled 2018-10-03 (×2): qty 1000

## 2018-10-03 MED ORDER — IOHEXOL 350 MG/ML SOLN
60.0000 mL | Freq: Once | INTRAVENOUS | Status: AC | PRN
  Administered 2018-10-03: 09:00:00 via INTRAVENOUS

## 2018-10-03 MED ORDER — MENTHOL 3 MG MT LOZG
1.0000 | LOZENGE | OROMUCOSAL | Status: DC | PRN
  Filled 2018-10-03: qty 9

## 2018-10-03 MED ORDER — ALBUTEROL SULFATE (2.5 MG/3ML) 0.083% IN NEBU
2.5000 mg | INHALATION_SOLUTION | RESPIRATORY_TRACT | Status: DC | PRN
  Administered 2018-10-03 – 2018-10-05 (×8): 2.5 mg via RESPIRATORY_TRACT
  Filled 2018-10-03 (×9): qty 3

## 2018-10-03 MED ORDER — WARFARIN SODIUM 1 MG PO TABS
1.5000 mg | ORAL_TABLET | Freq: Once | ORAL | Status: AC
  Administered 2018-10-03: 1.5 mg via ORAL
  Filled 2018-10-03: qty 1

## 2018-10-03 NOTE — Consult Note (Signed)
ANTICOAGULATION CONSULT NOTE - Initial Consult  Pharmacy Consult for Warfarin Dosing and Monitoring  Indication: atrial fibrillation  Allergies  Allergen Reactions  . Penicillins Rash    Has patient had a PCN reaction causing immediate rash, facial/tongue/throat swelling, SOB or lightheadedness with hypotension: Yes Has patient had a PCN reaction causing severe rash involving mucus membranes or skin necrosis: No Has patient had a PCN reaction that required hospitalization: No Has patient had a PCN reaction occurring within the last 10 years: No If all of the above answers are "NO", then may proceed with Cephalosporin use.  . Prednisone Hives  . Sulfa Antibiotics Hives and Shortness Of Breath    Patient Measurements: Height: 5\' 4"  (162.6 cm) Weight: 187 lb 9.6 oz (85.1 kg) IBW/kg (Calculated) : 54.7  Vital Signs: Temp: 98.5 F (36.9 C) (04/07 0753) Temp Source: Oral (04/07 0753) BP: 112/58 (04/07 0753) Pulse Rate: 143 (04/07 0753)  Labs: Recent Labs    10/01/18 0328 10/02/18 0357 10/03/18 0315  HGB 10.4*  --  9.1*  HCT 29.6*  --  27.1*  PLT 188  --  213  LABPROT 15.7* 18.0* 17.0*  INR 1.3* 1.5* 1.4*  CREATININE 1.66* 1.90* 1.61*    Estimated Creatinine Clearance: 33.8 mL/min (A) (by C-G formula based on SCr of 1.61 mg/dL (H)).   Medical History: Past Medical History:  Diagnosis Date  . Acute URI   . Allergy   . Anxiety   . Asthma   . Cancer ([place])    lymphoma in remission  . Diverticulosis   . DVT of upper extremity (deep vein thrombosis) ([place]) 07/2008 & 12/2007  . Gastric ulcer 10/2007   EGD  . Hepatitis B    treated  . Hypertension   . Lower extremity edema   . Lymphoma ([place])    Stage 3 large B cell lymphoma    Assessment: Pharmacy consulted for warfarin dosing and monitoring for 73 yo female with PMH of A. Fib. INR continues to remain subtherapeutic  @ 1.4 s/p boosted dose x 2. Patient was on Levofloxacin and is currently on Ceftriaxone and  Azithromycin. Given current use of these antibiotics that could increase her INR, I would be cautious on aggressively adjust warfarin. The therapeutic effects of this boosted have yet to be seen, as expected.   Home Regimen: Warfarin 1mg  daily   DATE INR DOSE (mg) 4/5 1.3 1.5 4/6       1.5 2 4/7 1.4  Goal of Therapy:  INR 2-3 Monitor platelets by anticoagulation protocol: Yes   Plan:  4/5 Will order warfarin 1.5 mg x 1 dose for 1800. Will check INRs daily while on abx per protocol.  Next INR ordered 4/8 w/AM labs.  Pharmacy will continue to monitor and order/adjsut warfarin doses as needed.   Rowland Lathe, PharmD Clinical Pharmacist 10/03/2018 8:09 AM

## 2018-10-03 NOTE — Progress Notes (Signed)
Erin [name] at [place] NAME: Erin [name]    MR#:  [id number]  DATE OF BIRTH:  [birth year]  SUBJECTIVE:  CHIEF COMPLAINT:   Chief Complaint  Patient presents with  . Cough  . Fever   -Streptococcus pneumonia bacteremia.  Still complains of shortness of breath and fatigue. -Pleuritic left-sided chest pain on coughing  REVIEW OF SYSTEMS:  Review of Systems  Constitutional: Positive for malaise/fatigue. Negative for chills and fever.  HENT: Negative for congestion, ear discharge, hearing loss and nosebleeds.   Respiratory: Positive for cough and shortness of breath. Negative for wheezing.   Cardiovascular: Positive for chest pain. Negative for palpitations.  Gastrointestinal: Negative for abdominal pain, constipation, diarrhea, nausea and vomiting.  Genitourinary: Negative for dysuria.  Musculoskeletal: Negative for myalgias.  Neurological: Negative for dizziness, focal weakness, seizures, weakness and headaches.  Psychiatric/Behavioral: Negative for depression.    DRUG ALLERGIES:   Allergies  Allergen Reactions  . Penicillins Rash    Has patient had a PCN reaction causing immediate rash, facial/tongue/throat swelling, SOB or lightheadedness with hypotension: Yes Has patient had a PCN reaction causing severe rash involving mucus membranes or skin necrosis: No Has patient had a PCN reaction that required hospitalization: No Has patient had a PCN reaction occurring within the last 10 years: No If all of the above answers are "NO", then may proceed with Cephalosporin use.  . Prednisone Hives  . Sulfa Antibiotics Hives and Shortness Of Breath    VITALS:  Blood pressure (!) 112/58, pulse (!) 143, temperature 98.5 F (36.9 C), temperature source Oral, resp. rate 20, height 5\' 4"  (1.626 m), weight 85.1 kg, SpO2 100 %.  PHYSICAL EXAMINATION:  Physical Exam   GENERAL:  73 y.o.-year-old elderly patient lying in the bed with no acute  distress.  EYES: Pupils equal, round, reactive to light and accommodation. No scleral icterus. Extraocular muscles intact.  HEENT: Head atraumatic, normocephalic. Oropharynx and nasopharynx clear.  NECK:  Supple, no jugular venous distention. No thyroid enlargement, no tenderness.  LUNGS: Normal breath sounds bilaterally, no wheezing, rales,rhonchi or crepitation. No use of accessory muscles of respiration.  Decreased left basilar breath sounds CARDIOVASCULAR: S1, S2 normal. No murmurs, rubs, or gallops.  ABDOMEN: Soft, nontender, nondistended. Bowel sounds present. No organomegaly or mass.  EXTREMITIES: No pedal edema, cyanosis, or clubbing.  NEUROLOGIC: Cranial nerves II through XII are intact. Muscle strength 5/5 in all extremities. Sensation intact. Gait not checked.  Global weakness noted PSYCHIATRIC: The patient is alert and oriented x 3.  SKIN: No obvious rash, lesion, or ulcer.    LABORATORY PANEL:   CBC Recent Labs  Lab 10/03/18 0315  WBC 12.4*  HGB 9.1*  HCT 27.1*  PLT 213   ------------------------------------------------------------------------------------------------------------------  Chemistries  Recent Labs  Lab 10/01/18 0328 10/02/18 0357 10/03/18 0315  NA 128* 142 145  K 2.8* 3.7 3.7  CL 94* 117* 120*  CO2 21* 20* 19*  GLUCOSE 146* 119* 97  BUN 17 17 15   CREATININE 1.66* 1.90* 1.61*  CALCIUM 7.4* 6.9* 7.6*  MG  --  2.4  --   AST 21  --   --   ALT 19  --   --   ALKPHOS 113  --   --   BILITOT 0.4  --   --    ------------------------------------------------------------------------------------------------------------------  Cardiac Enzymes No results for input(s): TROPONINI in the last 168 hours. ------------------------------------------------------------------------------------------------------------------  RADIOLOGY:  Dg Chest 2 View  Result Date: 10/03/2018  CLINICAL DATA:  Pneumonia. EXAM: CHEST - 2 VIEW COMPARISON:  10/01/2018 and 08/06/2018  FINDINGS: Right IJ central venous catheters unchanged. Lungs are adequately inflated demonstrate slight interval improvement in airspace consolidation over the left upper lobe likely improving pneumonia. Mild linear density left base likely atelectasis. Minimal blunting of the left costophrenic angle which may represent a small amount left pleural fluid. Right lung is clear. Cardiomediastinal silhouette and remainder of the exam is unchanged. IMPRESSION: Left upper lobe airspace process with slight interval improvement likely improving pneumonia. Linear atelectasis left base. Possible small amount left pleural fluid. Electronically Signed   By: Marin Olp M.D.   On: 10/03/2018 08:39   Ct Angio Chest Pe W Or Wo Contrast  Result Date: 10/03/2018 CLINICAL DATA:  Pneumonia. Shortness of breath. History of asthma. History of lymphoma in remission. EXAM: CT ANGIOGRAPHY CHEST WITH CONTRAST TECHNIQUE: Multidetector CT imaging of the chest was performed using the standard protocol during bolus administration of intravenous contrast. Multiplanar CT image reconstructions and MIPs were obtained to evaluate the vascular anatomy. CONTRAST:  60 mL OMNIPAQUE IOHEXOL 350 MG/ML SOLN COMPARISON:  Prior chest radiographs, most recent obtained earlier today. FINDINGS: Cardiovascular: There is satisfactory opacification of the pulmonary arteries to the segmental level. There is no evidence of a pulmonary embolism. Heart is normal in size and configuration. No pericardial effusion. No coronary artery calcifications. Great vessels are normal in caliber. No aortic dissection. Mild aortic atherosclerosis. Mediastinum/Nodes: No enlarged mediastinal, hilar, or axillary lymph nodes. Thyroid gland, trachea, and esophagus demonstrate no significant findings. Lungs/Pleura: Small left and trace right pleural effusions. There is dense consolidation in the posterior left upper lobe centered on the posterior apicoposterior segment, but extending  inferiorly to involve the superior segment of the lingular division. Small areas of ground-glass opacity are noted in the right upper lobe which may reflect additional areas of infection. Linear atelectasis is noted in anterior right upper lobe and in both posterior lower lobes at the bases, left greater than right. Remainder of the lungs is clear. Mild elevation of left hemidiaphragm. No pneumothorax. Upper Abdomen: No acute abnormality. Musculoskeletal: No fracture or acute finding. No osteoblastic or osteolytic lesions. Review of the MIP images confirms the above findings. IMPRESSION: 1. No evidence of a pulmonary embolism. 2. Dense consolidation in the left upper lobe consistent with pneumonia. 3. Small areas of ground-glass opacity in the right upper lobe likely additional foci of infection. 4. Small left and minimal right pleural effusions. Aortic Atherosclerosis (ICD10-I70.0). Electronically Signed   By: Lajean Manes M.D.   On: 10/03/2018 09:32    EKG:   Orders placed or performed during the hospital encounter of 10/01/18  . ED EKG 12-Lead  . ED EKG 12-Lead    ASSESSMENT AND PLAN:   73 year old female with past medical history of hepatitis B, hypertension, anxiety, history of lymphoma, GERD who presented to the hospital due to fever, shortness of breath and cough.  1.  Sepsis- secondary to community-acquired pneumonia. -Positive blood cultures for Streptococcus pneumoniae -Negative for COVID-19. -Has pleuritic chest pain secondary to pneumonia.  Muscle relaxants added -Continue Rocephin.  Changed to 2 g-Off oxygen -CT of the chest showing no pulmonary embolism but having left upper lobe consolidation  2.  GERD-continue Protonix.  3.  Depression-continue Celexa and Remeron.  4. Hx of Hep B - cont. Entecavir-changed to tenofovir in the hospital  5. Neuropathy - cont. Neurontin.   6. Hx of previous DVT - cont. Coumadin.    7.  CKD stage III-continue to monitor with  fluids.  Ambulate with physical therapy Possible discharge in 1 to 2 days   All the records are reviewed and case discussed with Care Management/Social Workerr. Management plans discussed with the patient, family and they are in agreement.  CODE STATUS: Full code  TOTAL TIME TAKING CARE OF THIS PATIENT: 38 minutes.   POSSIBLE D/C IN 1-2 DAYS, DEPENDING ON CLINICAL CONDITION.   Gladstone Lighter M.D on 10/03/2018 at 12:46 PM  Between 7am to 6pm - Pager - [phone]  After 6pm go to [email or web address] - password [email or web address] [facility]  Office  [phone]  CC: Primary care physician; White, Ogema J, DO

## 2018-10-03 NOTE — TOC Initial Note (Signed)
Transition of Care [facility]) - Initial/Assessment Note    Patient Details  Name: Erin [name] MRN: [id number] Date of Birth: 1946-03-18  Transition of Care [facility]) CM/SW Contact:    Elza Rafter, RN Phone Number: 10/03/2018, 9:06 AM  Clinical Narrative:     Patient is from home alone and independent.  She has 4 sons and 2 daughters that live in the area but are not very supportive.  She states, "they are busy with their own families". She is current with her PCP and obtains medications at [facility] without difficult-they deliver her medications.  She does not use a walker or any DME.  No home oxygen.  There is a DC order in place.  Spoke with patient via telephone and she is still SOB during our conversation.    Will notify MD and RN.  Referral for home health RN and PT to [facility].  Patient does not have a preference of home health agency.           Expected Discharge Plan: [place] Barriers to Discharge: (Patient is still shoort of breath)   Patient Goals and CMS Choice Patient states their goals for this hospitalization and ongoing recovery are:: Stay one more day and then DC home with home health CMS Medicare.gov Compare Post Acute Care list provided to:: Patient Choice offered to / list presented to : Patient  Expected Discharge Plan and Services Expected Discharge Plan: [place]   Discharge Planning Services: CM Consult Post Acute Care Choice: [place] arrangements for the past 2 months: Single Family Home                     HH Arranged: RN, PT [place] Agency: [place] ([facility])  Prior Living Arrangements/Services Living arrangements for the past 2 months: Single Family Home Lives with:: Self Patient language and need for interpreter reviewed:: Yes Do you feel safe going back to the place where you live?: Yes        Care giver support system in place?: No (comment)(Independent from home)    Criminal Activity/Legal Involvement Pertinent to Current Situation/Hospitalization: No - Comment as needed  Activities of Daily Living Home Assistive Devices/Equipment: None ADL Screening (condition at time of admission) Patient's cognitive ability adequate to safely complete daily activities?: Yes Is the patient deaf or have difficulty hearing?: Yes Does the patient have difficulty seeing, even when wearing glasses/contacts?: No Does the patient have difficulty concentrating, remembering, or making decisions?: No Patient able to express need for assistance with ADLs?: Yes Does the patient have difficulty dressing or bathing?: No Independently performs ADLs?: Yes (appropriate for developmental age) Does the patient have difficulty walking or climbing stairs?: No Weakness of Legs: Both Weakness of Arms/Hands: None  Permission Sought/Granted Permission sought to share information with : Facility [facility] administrator granted to share info w AGENCY: [facility]        Emotional Assessment   Attitude/Demeanor/Rapport: Gracious Affect (typically observed): Accepting Orientation: : Oriented to Self, Oriented to Place, Oriented to  Time, Oriented to Situation Alcohol / Substance Use: Not Applicable    Admission diagnosis:  Community acquired pneumonia of left upper lobe of lung ([place]) [J18.1] Sepsis, due to unspecified organism, unspecified whether acute organ dysfunction present [facility]) [A41.9] Patient Active Problem List   Diagnosis Date Noted  . Sepsis ([place]) 10/01/2018  . Asthma exacerbation 10/05/2017  .  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 Disease of liver 12/18/2014  . Acute URI 11/07/2014  . BP (high blood pressure) 05/09/2014  . Chronic hepatitis B virus infection ([place]) 03/27/2011  . Lymphoma ([place]) 10/27/2007   PCP:  Creola Corn, DO Pharmacy:   [facility], [place]  [address] [place] [address] Phone: [phone] Fax: [facility] [address] [place] [address] Phone: [phone] Fax: [place], [place] - [place] AT [facility] [address] [place] [address] Phone: [phone] Fax: [phone]     Social Determinants of Health ([place]) Interventions    Readmission Risk Interventions No flowsheet data found.

## 2018-10-03 NOTE — Progress Notes (Addendum)
Pt c/o SOB and requesting nebulizer treatments. MD Marcille Blanco made aware. Will continue to monitor.

## 2018-10-03 NOTE — Progress Notes (Signed)
Physical Therapy Treatment Patient Details Name: Erin [name] MRN: [id number] DOB: [birth year] Today's Date: 10/03/2018    History of Present Illness Patient presented from private residence apartment via EMS to hospital ER on 08/31/2018 complaining of cough 2-3 weeks and fever last 2 days. She was diagnosed with pneumonia of left upper lobe of lung and sepsis and admitted to the hospital for treatment. Relevant PMH include history of large B-cell lymphoma currently in remission, HTN, hepatitis B, anxiety, diverticulosis, neuropathy, depression, GERD, DVT of upper extremmity, cholecystectomy. Patient tested and found negative for COVID19. Chest radiographs show changes consistent wiht left upper lobe pneumonia.     PT Comments    Patient reclining in bed at start of session. Reports she continues to have pain in the left chest but is willing to get up and participate with physical therapy. Patient tolerated treatment well and is making good progress towards goals at this point. She was able to ambulate 260 feet with supervision this session and progressed activity tolerance to 2 sets of 5 sit <> stands. Provided and performed exercises from HEP including handout. Patient feels short of breath and fatigues quickly but her O2 sat remains in the 90s during functional mobility exercise. Patient would benefit from continued physical therapy to address her remaining impairments and functional limitations to work towards stated goals and return to PLOF or maximal functional independence.    Follow Up Recommendations  Home health PT     Equipment Recommendations  None recommended by PT    Recommendations for Other Services       Precautions / Restrictions Precautions Precautions: Fall Restrictions Weight Bearing Restrictions: No    Mobility  Bed Mobility Overal bed mobility: Independent             General bed mobility comments: supine to sit without assistance  Transfers Overall  transfer level: Needs assistance Equipment used: None Transfers: Sit to/from Stand Sit to Stand: Supervision         General transfer comment: sit <> stand x2 trials from edge of bed when getting ready and up for ambulation. sit <> stand 2x5 trials from chair with supervision  Ambulation/Gait Ambulation/Gait assistance: Supervision Gait Distance (Feet): 260 Feet Assistive device: None Gait Pattern/deviations: Decreased stride length;Step-through pattern     General Gait Details: slow pace, feels out of breath. O2 sat 94% on RA and HR  125 bpm after amb. Reaching for walls and support initially but no loss of balance.    Stairs             Wheelchair Mobility    Modified Rankin (Stroke Patients Only)       Balance Overall balance assessment: Needs assistance Sitting-balance support: No upper extremity supported Sitting balance-[name] Scale: Normal Sitting balance - Comments: appears WNL   Standing balance support: No upper extremity supported Standing balance-[name] Scale: Good Standing balance comment: pt reaches for counters when close but maintains balance without UE support otherwise.                             Cognition Arousal/Alertness: Awake/alert Behavior During Therapy: WFL for tasks assessed/performed Overall Cognitive Status: Within Functional Limits for tasks assessed                                 General Comments: seems more cheerful today      Exercises Other  Exercises Other Exercises: pursed lip breathing during and after ambulation and transfers.  Other Exercises: HEP provided and reveiwed. Handout provided. Completed seated knee extension x 10 each side, seated marching x 10 each side, ankle pumps x 10 each side, hip adduction against pillow with 5 second holds 2x5.  Other Exercises: sit <> stand from chair with supervision 2x5 reps to improve functional strength and endurance for transfers and mobility.      General Comments        Pertinent Vitals/Pain Pain Assessment: 0-10 Pain Score: 6  Pain Location: left chest Pain Descriptors / Indicators: Aching Pain Intervention(s): Monitored during session    Home Living                      Prior Function            PT Goals (current goals can now be found in the care plan section)      Frequency    Min 2X/week      PT Plan Current plan remains appropriate    Co-evaluation              AM-PAC PT "6 Clicks" Mobility   Outcome Measure  Help needed turning from your back to your side while in a flat bed without using bedrails?: None Help needed moving from lying on your back to sitting on the side of a flat bed without using bedrails?: None Help needed moving to and from a bed to a chair (including a wheelchair)?: None Help needed standing up from a chair using your arms (e.g., wheelchair or bedside chair)?: None Help needed to walk in hospital room?: A Little Help needed climbing 3-5 steps with a railing? : Total 6 Click Score: 20    End of Session Equipment Utilized During Treatment: Gait belt Activity Tolerance: Patient tolerated treatment well;Patient limited by fatigue;Patient limited by pain Patient left: in chair;with call bell/phone within reach;with chair alarm set Nurse Communication: Mobility status(results of session, leaking IV) PT Visit Diagnosis: Muscle weakness (generalized) (M62.81);Difficulty in walking, not elsewhere classified (R26.2)     Time: 8295-6213 PT Time Calculation (min) (ACUTE ONLY): 40 min  Charges:  $Gait Training: 8-22 mins $Therapeutic Exercise: 8-22 mins $Therapeutic Activity: 8-22 mins                     Everlean Alstrom. Graylon Good, PT, DPT 10/03/18, 1:26 PM

## 2018-10-04 LAB — BASIC METABOLIC PANEL
Anion gap: 8 (ref 5–15)
BUN: 13 mg/dL (ref 8–23)
CO2: 19 mmol/L — ABNORMAL LOW (ref 22–32)
Calcium: 7.6 mg/dL — ABNORMAL LOW (ref 8.9–10.3)
Chloride: 114 mmol/L — ABNORMAL HIGH (ref 98–111)
Creatinine, Ser: 1.46 mg/dL — ABNORMAL HIGH (ref 0.44–1.00)
GFR calc Af Amer: 42 mL/min — ABNORMAL LOW (ref >60)
GFR calc non Af Amer: 36 mL/min — ABNORMAL LOW (ref >60)
Glucose, Bld: 124 mg/dL — ABNORMAL HIGH (ref 70–99)
Potassium: 2.9 mmol/L — ABNORMAL LOW (ref 3.5–5.1)
Sodium: 141 mmol/L (ref 135–145)

## 2018-10-04 LAB — PROTIME-INR
INR: 1.4 — ABNORMAL HIGH (ref 0.8–1.2)
Prothrombin Time: 17.1 seconds — ABNORMAL HIGH (ref 11.4–15.2)

## 2018-10-04 LAB — MAGNESIUM: Magnesium: 1.8 mg/dL (ref 1.7–2.4)

## 2018-10-04 MED ORDER — POLYETHYLENE GLYCOL 3350 17 G PO PACK
17.0000 g | PACK | Freq: Every day | ORAL | Status: DC
  Administered 2018-10-04 – 2018-10-05 (×2): 17 g via ORAL
  Filled 2018-10-04 (×2): qty 1

## 2018-10-04 MED ORDER — POTASSIUM CHLORIDE CRYS ER 20 MEQ PO TBCR
40.0000 meq | EXTENDED_RELEASE_TABLET | ORAL | Status: AC
  Administered 2018-10-04 (×2): 40 meq via ORAL
  Filled 2018-10-04 (×2): qty 2

## 2018-10-04 MED ORDER — POTASSIUM CHLORIDE CRYS ER 20 MEQ PO TBCR
20.0000 meq | EXTENDED_RELEASE_TABLET | Freq: Once | ORAL | Status: AC
  Administered 2018-10-04: 20 meq via ORAL
  Filled 2018-10-04: qty 1

## 2018-10-04 MED ORDER — BISACODYL 5 MG PO TBEC: 10.0000 mg | DELAYED_RELEASE_TABLET | Freq: Every day | ORAL | Status: DC | PRN

## 2018-10-04 MED ORDER — SODIUM CHLORIDE 0.9 % IV SOLN
INTRAVENOUS | Status: DC | PRN
  Administered 2018-10-04: 500 mL via INTRAVENOUS

## 2018-10-04 MED ORDER — MOMETASONE FURO-FORMOTEROL FUM 200-5 MCG/ACT IN AERO
2.0000 | INHALATION_SPRAY | Freq: Two times a day (BID) | RESPIRATORY_TRACT | Status: DC
  Administered 2018-10-04 – 2018-10-05 (×2): 2 via RESPIRATORY_TRACT
  Filled 2018-10-04: qty 8.8

## 2018-10-04 MED ORDER — METHOCARBAMOL 500 MG PO TABS
500.0000 mg | ORAL_TABLET | Freq: Three times a day (TID) | ORAL | Status: DC
  Administered 2018-10-04 – 2018-10-05 (×3): 500 mg via ORAL
  Filled 2018-10-04 (×5): qty 1

## 2018-10-04 MED ORDER — WARFARIN SODIUM 2.5 MG PO TABS
2.5000 mg | ORAL_TABLET | Freq: Once | ORAL | Status: AC
  Administered 2018-10-04: 2.5 mg via ORAL
  Filled 2018-10-04: qty 1

## 2018-10-04 MED ORDER — IPRATROPIUM-ALBUTEROL 0.5-2.5 (3) MG/3ML IN SOLN
3.0000 mL | Freq: Three times a day (TID) | RESPIRATORY_TRACT | Status: DC
  Administered 2018-10-05 (×2): 3 mL via RESPIRATORY_TRACT
  Filled 2018-10-04 (×2): qty 3

## 2018-10-04 NOTE — Progress Notes (Signed)
PT Cancellation Note  Patient Details Name: Erin [name] MRN: [id number] DOB: 1945/12/29   Cancelled Treatment:    Reason Eval/Treat Not Completed: Medical issues which prohibited therapy(potassium 2.9 and resting HR 123 bpm. Will try again this afternoon. )   Everlean Alstrom. Graylon Good, PT, DPT 10/04/18, 11:41 AM

## 2018-10-04 NOTE — Progress Notes (Signed)
MD notified of potassium of 2.9. New orders placed. Will continue to monitor.

## 2018-10-04 NOTE — Progress Notes (Signed)
[facility] [place] NAME: Erin [name]    MR#:  [id number]  DATE OF BIRTH:  [birth year]  SUBJECTIVE:  CHIEF COMPLAINT:   Chief Complaint  Patient presents with  . Cough  . Fever   -Streptococcus pneumonia bacteremia.  Left-sided chest pain on breathing. -Potassium is low today.  Able to ambulate with minimal support  REVIEW OF SYSTEMS:  Review of Systems  Constitutional: Positive for malaise/fatigue. Negative for chills and fever.  HENT: Negative for congestion, ear discharge, hearing loss and nosebleeds.   Respiratory: Positive for cough and shortness of breath. Negative for wheezing.   Cardiovascular: Positive for chest pain. Negative for palpitations.  Gastrointestinal: Negative for abdominal pain, constipation, diarrhea, nausea and vomiting.  Genitourinary: Negative for dysuria.  Musculoskeletal: Negative for myalgias.  Neurological: Negative for dizziness, focal weakness, seizures, weakness and headaches.  Psychiatric/Behavioral: Negative for depression.    DRUG ALLERGIES:   Allergies  Allergen Reactions  . Penicillins Rash    Has patient had a PCN reaction causing immediate rash, facial/tongue/throat swelling, SOB or lightheadedness with hypotension: Yes Has patient had a PCN reaction causing severe rash involving mucus membranes or skin necrosis: No Has patient had a PCN reaction that required hospitalization: No Has patient had a PCN reaction occurring within the last 10 years: No If all of the above answers are "NO", then may proceed with Cephalosporin use.  . Prednisone Hives  . Sulfa Antibiotics Hives and Shortness Of Breath    VITALS:  Blood pressure 128/70, pulse (!) 103, temperature 98.9 F (37.2 C), temperature source Oral, resp. rate 20, height 5\' 4"  (1.626 m), weight 84 kg, SpO2 96 %.  PHYSICAL EXAMINATION:  Physical Exam   GENERAL:  73 y.o.-year-old elderly patient lying in the bed with no acute  distress.  EYES: Pupils equal, round, reactive to light and accommodation. No scleral icterus. Extraocular muscles intact.  HEENT: Head atraumatic, normocephalic. Oropharynx and nasopharynx clear.  NECK:  Supple, no jugular venous distention. No thyroid enlargement, no tenderness.  LUNGS: Normal breath sounds bilaterally, no wheezing, rales,rhonchi or crepitation. No use of accessory muscles of respiration.  Decreased left basilar breath sounds CARDIOVASCULAR: S1, S2 normal. No murmurs, rubs, or gallops.  ABDOMEN: Soft, nontender, nondistended. Bowel sounds present. No organomegaly or mass.  EXTREMITIES: No pedal edema, cyanosis, or clubbing.  NEUROLOGIC: Cranial nerves II through XII are intact. Muscle strength 5/5 in all extremities. Sensation intact. Gait not checked.  Global weakness noted PSYCHIATRIC: The patient is alert and oriented x 3.  SKIN: No obvious rash, lesion, or ulcer.    LABORATORY PANEL:   CBC Recent Labs  Lab 10/03/18 0315  WBC 12.4*  HGB 9.1*  HCT 27.1*  PLT 213   ------------------------------------------------------------------------------------------------------------------  Chemistries  Recent Labs  Lab 10/01/18 0328 10/02/18 0357  10/04/18 0427  NA 128* 142   < > 141  K 2.8* 3.7   < > 2.9*  CL 94* 117*   < > 114*  CO2 21* 20*   < > 19*  GLUCOSE 146* 119*   < > 124*  BUN 17 17   < > 13  CREATININE 1.66* 1.90*   < > 1.46*  CALCIUM 7.4* 6.9*   < > 7.6*  MG  --  2.4  --   --   AST 21  --   --   --   ALT 19  --   --   --   ALKPHOS 113  --   --   --  BILITOT 0.4  --   --   --    < > = values in this interval not displayed.   ------------------------------------------------------------------------------------------------------------------  Cardiac Enzymes No results for input(s): TROPONINI in the last 168 hours. ------------------------------------------------------------------------------------------------------------------  RADIOLOGY:  Dg  Chest 2 View  Result Date: 10/03/2018 CLINICAL DATA:  Pneumonia. EXAM: CHEST - 2 VIEW COMPARISON:  10/01/2018 and 08/06/2018 FINDINGS: Right IJ central venous catheters unchanged. Lungs are adequately inflated demonstrate slight interval improvement in airspace consolidation over the left upper lobe likely improving pneumonia. Mild linear density left base likely atelectasis. Minimal blunting of the left costophrenic angle which may represent a small amount left pleural fluid. Right lung is clear. Cardiomediastinal silhouette and remainder of the exam is unchanged. IMPRESSION: Left upper lobe airspace process with slight interval improvement likely improving pneumonia. Linear atelectasis left base. Possible small amount left pleural fluid. Electronically Signed   By: Marin Olp M.D.   On: 10/03/2018 08:39   Ct Angio Chest Pe W Or Wo Contrast  Result Date: 10/03/2018 CLINICAL DATA:  Pneumonia. Shortness of breath. History of asthma. History of lymphoma in remission. EXAM: CT ANGIOGRAPHY CHEST WITH CONTRAST TECHNIQUE: Multidetector CT imaging of the chest was performed using the standard protocol during bolus administration of intravenous contrast. Multiplanar CT image reconstructions and MIPs were obtained to evaluate the vascular anatomy. CONTRAST:  60 mL OMNIPAQUE IOHEXOL 350 MG/ML SOLN COMPARISON:  Prior chest radiographs, most recent obtained earlier today. FINDINGS: Cardiovascular: There is satisfactory opacification of the pulmonary arteries to the segmental level. There is no evidence of a pulmonary embolism. Heart is normal in size and configuration. No pericardial effusion. No coronary artery calcifications. Great vessels are normal in caliber. No aortic dissection. Mild aortic atherosclerosis. Mediastinum/Nodes: No enlarged mediastinal, hilar, or axillary lymph nodes. Thyroid gland, trachea, and esophagus demonstrate no significant findings. Lungs/Pleura: Small left and trace right pleural effusions.  There is dense consolidation in the posterior left upper lobe centered on the posterior apicoposterior segment, but extending inferiorly to involve the superior segment of the lingular division. Small areas of ground-glass opacity are noted in the right upper lobe which may reflect additional areas of infection. Linear atelectasis is noted in anterior right upper lobe and in both posterior lower lobes at the bases, left greater than right. Remainder of the lungs is clear. Mild elevation of left hemidiaphragm. No pneumothorax. Upper Abdomen: No acute abnormality. Musculoskeletal: No fracture or acute finding. No osteoblastic or osteolytic lesions. Review of the MIP images confirms the above findings. IMPRESSION: 1. No evidence of a pulmonary embolism. 2. Dense consolidation in the left upper lobe consistent with pneumonia. 3. Small areas of ground-glass opacity in the right upper lobe likely additional foci of infection. 4. Small left and minimal right pleural effusions. Aortic Atherosclerosis (ICD10-I70.0). Electronically Signed   By: Lajean Manes M.D.   On: 10/03/2018 09:32    EKG:   Orders placed or performed during the hospital encounter of 10/01/18  . ED EKG 12-Lead  . ED EKG 12-Lead    ASSESSMENT AND PLAN:   73 year old female with past medical history of hepatitis B, hypertension, anxiety, history of lymphoma, GERD who presented to the hospital due to fever, shortness of breath and cough.  1.  Sepsis- secondary to community-acquired pneumonia. -Positive blood cultures for Streptococcus pneumoniae -Negative for COVID-19. -Has pleuritic chest pain secondary to pneumonia.  Muscle relaxants added -Continue Rocephin.  Changed to 2 g-Off oxygen -CT of the chest showing no pulmonary embolism but having  left upper lobe consolidation -Follow-up chest x-ray in a.m.  2.  GERD-continue Protonix.  3.  Depression-continue Celexa and Remeron.  4. Hx of Hep B - cont. Entecavir-changed to  tenofovir in the hospital  5. Neuropathy - cont. Neurontin.   6. Hx of previous DVT - cont. Coumadin.    7.  CKD stage III-continue IV fluids today.  8.  Hypokalemia-being replaced, check magnesium level  Ambulate with physical therapy Possible discharge tomorrow   All the records are reviewed and case discussed with Care Management/Social Workerr. Management plans discussed with the patient, family and they are in agreement.  CODE STATUS: Full code  TOTAL TIME TAKING CARE OF THIS PATIENT: 36 minutes.   POSSIBLE D/C IN 1-2 DAYS, DEPENDING ON CLINICAL CONDITION.   Gladstone Lighter M.D on 10/04/2018 at 1:09 PM  Between 7am to 6pm - Pager - [phone]  After 6pm go to [email or web address] - password [email or web address] [facility]  Office  [phone]  CC: Primary care physician; White, [place] J, DO

## 2018-10-04 NOTE — Consult Note (Signed)
ANTICOAGULATION CONSULT NOTE - Initial Consult  Pharmacy Consult for Warfarin Dosing and Monitoring  Indication: atrial fibrillation  Allergies  Allergen Reactions  . Penicillins Rash    Has patient had a PCN reaction causing immediate rash, facial/tongue/throat swelling, SOB or lightheadedness with hypotension: Yes Has patient had a PCN reaction causing severe rash involving mucus membranes or skin necrosis: No Has patient had a PCN reaction that required hospitalization: No Has patient had a PCN reaction occurring within the last 10 years: No If all of the above answers are "NO", then may proceed with Cephalosporin use.  . Prednisone Hives  . Sulfa Antibiotics Hives and Shortness Of Breath    Patient Measurements: Height: 5\' 4"  (162.6 cm) Weight: 185 lb 1.6 oz (84 kg) IBW/kg (Calculated) : 54.7  Vital Signs: Temp: 98.2 F (36.8 C) (04/08 0331) Temp Source: Oral (04/08 0331) BP: 109/67 (04/08 0331) Pulse Rate: 102 (04/08 0331)  Labs: Recent Labs    10/02/18 0357 10/03/18 0315 10/04/18 0427  HGB  --  9.1*  --   HCT  --  27.1*  --   PLT  --  213  --   LABPROT 18.0* 17.0* 17.1*  INR 1.5* 1.4* 1.4*  CREATININE 1.90* 1.61* 1.46*    Estimated Creatinine Clearance: 37 mL/min (A) (by C-G formula based on SCr of 1.46 mg/dL (H)).   Medical History: Past Medical History:  Diagnosis Date  . Acute URI   . Allergy   . Anxiety   . Asthma   . Cancer ([place])    lymphoma in remission  . Diverticulosis   . DVT of upper extremity (deep vein thrombosis) ([place]) 07/2008 & 12/2007  . Gastric ulcer 10/2007   EGD  . Hepatitis B    treated  . Hypertension   . Lower extremity edema   . Lymphoma ([place])    Stage 3 large B cell lymphoma    Assessment: Pharmacy consulted for warfarin dosing and monitoring for 73 yo female with PMH of A. Fib. INR continues to remain subtherapeutic  @ 1.4 s/p boosted dose x 3. Patient was on Levofloxacin (last dose given 4/5). She is currently on  Ceftriaxone and Azithromycin, which could increase her INR. However, these are not antibiotics that would significantly elevate the INR. Will need to boost the dose since her INR remains subtherapeutic.   Home Regimen: Warfarin 1mg  daily   DATE INR DOSE (mg) 4/5 1.3 1.5 4/6       1.5 2 4/7 1.4 1.5 4/8  1.4   Goal of Therapy:  INR 2-3 Monitor platelets by anticoagulation protocol: Yes   Plan:  Will order warfarin 2.5 mg x 1 dose for 1800. Will check INRs daily while on abx per protocol.  Next INR ordered 4/9 w/AM labs.  Pharmacy will continue to monitor and order/adjsut warfarin doses as needed.   Rowland Lathe, PharmD Clinical Pharmacist 10/04/2018 8:18 AM

## 2018-10-04 NOTE — Care Management Important Message (Signed)
Important Message  Patient Details  Name: Erin [name] MRN: [id number] Date of Birth: 1946/06/19   Medicare Important Message Given:  Yes    Dannette Barbara 10/04/2018, 11:28 AM

## 2018-10-04 NOTE — Progress Notes (Signed)
RT called to patient room by RN for PRN breathing treatment, due to shortness of breath. Patient slightly out of breath due to working herself up on phone call, no wheeze, room air SAT 96%.

## 2018-10-04 NOTE — Progress Notes (Signed)
Physical Therapy Treatment Patient Details Name: Erin [name] MRN: [id number] DOB: [birth year] Today's Date: 10/04/2018    History of Present Illness Patient presented from private residence apartment via EMS to hospital ER on 08/31/2018 complaining of cough 2-3 weeks and fever last 2 days. She was diagnosed with pneumonia of left upper lobe of lung and sepsis and admitted to the hospital for treatment. Relevant PMH include history of large B-cell lymphoma currently in remission, HTN, hepatitis B, anxiety, diverticulosis, neuropathy, depression, GERD, DVT of upper extremmity, cholecystectomy. Patient tested and found negative for COVID19. Chest radiographs show changes consistent wiht left upper lobe pneumonia.     PT Comments    Patient eager to get up with PT. Reports no pain but feels more anxious due to taking medications that have that effect on her. Patient tolerated treatment well and is making progress towards goals. Continues to fatigue quickly and became anxious with ambulation, but appeared less short of breath with functional mobility and vitals responded appropriately. Able to progress more sets of sit <> stand with less breathlessness noted following each set. Ambulated 300 feet with supervision, continues to reach for objects to stablize on with ambulation at times but less so than previously. Patient would benefit from continued physical therapy to address remaining impairments and functional limitations to work towards stated goals and return to PLOF or maximal functional independence.    Follow Up Recommendations  Home health PT     Equipment Recommendations  None recommended by PT    Recommendations for Other Services       Precautions / Restrictions Precautions Precautions: Fall Restrictions Weight Bearing Restrictions: No    Mobility  Bed Mobility Overal bed mobility: Independent             General bed mobility comments: supine to sit without  assistance  Transfers Overall transfer level: Needs assistance Equipment used: None Transfers: Sit to/from Stand Sit to Stand: Supervision         General transfer comment: sit <> stand x2 trials from edge of bed when getting ready and up for ambulation and one trial from [facility] with pericare with supervision. sit <> stand 23x5 trials from chair with supervision  Ambulation/Gait Ambulation/Gait assistance: Supervision Gait Distance (Feet): 300 Feet Assistive device: None Gait Pattern/deviations: Decreased stride length;Step-through pattern     General Gait Details: slow pace, feels out of breath. O2 sat 94% on RA and HR  127 bpm after amb. Less reaching for walls and support occasionally when fatigued, no loss of balance.    Stairs             Wheelchair Mobility    Modified Rankin (Stroke Patients Only)       Balance Overall balance assessment: Needs assistance Sitting-balance support: No upper extremity supported Sitting balance-[name] Scale: Normal Sitting balance - Comments: appears WNL   Standing balance support: No upper extremity supported Standing balance-[name] Scale: Good Standing balance comment: pt reaches for counters at times when close but maintains balance without UE support otherwise.                             Cognition Arousal/Alertness: Awake/alert Behavior During Therapy: WFL for tasks assessed/performed;Anxious Overall Cognitive Status: Within Functional Limits for tasks assessed                                 General  Comments: seems more cheerful today; seems lonely; notes that medication made her feel anxious      Exercises Other Exercises Other Exercises: pursed lip breathing during and after ambulation and transfers.  Other Exercises: HEP handout reviewed. Practiced hip adduction against pillow 2 second hold x 10. Attempted alternating marching with contralateral shoulder flexion x 5 each side. pt had  difficulty coordinating.  Other Exercises: sit <> stand from chair with supervision 3x5 reps to improve functional strength and endurance for transfers and mobility.     General Comments        Pertinent Vitals/Pain Pain Assessment: No/denies pain    Home Living                      Prior Function            PT Goals (current goals can now be found in the care plan section) Acute Rehab PT Goals Patient Stated Goal: feel better and return home PT Goal Formulation: With patient Time For Goal Achievement: 10/30/18 Potential to Achieve Goals: Good Progress towards PT goals: Progressing toward goals    Frequency    Min 2X/week      PT Plan Current plan remains appropriate    Co-evaluation              AM-PAC PT "6 Clicks" Mobility   Outcome Measure  Help needed turning from your back to your side while in a flat bed without using bedrails?: None Help needed moving from lying on your back to sitting on the side of a flat bed without using bedrails?: None Help needed moving to and from a bed to a chair (including a wheelchair)?: None Help needed standing up from a chair using your arms (e.g., wheelchair or bedside chair)?: None Help needed to walk in hospital room?: A Little Help needed climbing 3-5 steps with a railing? : Total 6 Click Score: 20    End of Session Equipment Utilized During Treatment: Gait belt Activity Tolerance: Patient tolerated treatment well;Patient limited by fatigue;Patient limited by pain Patient left: in chair;with call bell/phone within reach;with chair alarm set Nurse Communication: Mobility status(results of session, leaking IV) PT Visit Diagnosis: Muscle weakness (generalized) (M62.81);Difficulty in walking, not elsewhere classified (R26.2)     Time: 3235-5732 PT Time Calculation (min) (ACUTE ONLY): 39 min  Charges:  $Gait Training: 8-22 mins $Therapeutic Activity: 23-37 mins                     Everlean Alstrom. Graylon Good, PT,  DPT 10/04/18, 4:19 PM

## 2018-10-04 NOTE — Plan of Care (Signed)
  Problem: Education: Goal: Knowledge of General Education information will improve Description: Including pain rating scale, medication(s)/side effects and non-pharmacologic comfort measures Outcome: Progressing   Problem: Activity: Goal: Risk for activity intolerance will decrease Outcome: Progressing   Problem: Nutrition: Goal: Adequate nutrition will be maintained Outcome: Progressing   

## 2018-10-04 NOTE — Progress Notes (Signed)
Dr. Tressia Miners notified of patient's heart rate going up to the 130's while ambulating. Patient walked to the bathroom with no issues or concerns. No new orders from MD. Will continue to monitor patient.

## 2018-10-05 ENCOUNTER — Inpatient Hospital Stay: Payer: Medicare Other

## 2018-10-05 LAB — PROTIME-INR
INR: 1.4 — ABNORMAL HIGH (ref 0.8–1.2)
Prothrombin Time: 17.2 seconds — ABNORMAL HIGH (ref 11.4–15.2)

## 2018-10-05 LAB — CBC
HCT: 27.7 % — ABNORMAL LOW (ref 36.0–46.0)
Hemoglobin: 9.1 g/dL — ABNORMAL LOW (ref 12.0–15.0)
MCH: 31 pg (ref 26.0–34.0)
MCHC: 32.9 g/dL (ref 30.0–36.0)
MCV: 94.2 fL (ref 80.0–100.0)
Platelets: 219 10*3/uL (ref 150–400)
RBC: 2.94 MIL/uL — ABNORMAL LOW (ref 3.87–5.11)
RDW: 14.6 % (ref 11.5–15.5)
WBC: 6.4 10*3/uL (ref 4.0–10.5)
nRBC: 0 % (ref 0.0–0.2)

## 2018-10-05 LAB — BASIC METABOLIC PANEL
Anion gap: 10 (ref 5–15)
BUN: 14 mg/dL (ref 8–23)
CO2: 18 mmol/L — ABNORMAL LOW (ref 22–32)
Calcium: 8 mg/dL — ABNORMAL LOW (ref 8.9–10.3)
Chloride: 112 mmol/L — ABNORMAL HIGH (ref 98–111)
Creatinine, Ser: 1.49 mg/dL — ABNORMAL HIGH (ref 0.44–1.00)
GFR calc Af Amer: 41 mL/min — ABNORMAL LOW (ref >60)
GFR calc non Af Amer: 35 mL/min — ABNORMAL LOW (ref >60)
Glucose, Bld: 128 mg/dL — ABNORMAL HIGH (ref 70–99)
Potassium: 3.9 mmol/L (ref 3.5–5.1)
Sodium: 140 mmol/L (ref 135–145)

## 2018-10-05 MED ORDER — MOMETASONE FURO-FORMOTEROL FUM 200-5 MCG/ACT IN AERO: 2.0000 | INHALATION_SPRAY | Freq: Two times a day (BID) | RESPIRATORY_TRACT | 1 refills | Status: AC

## 2018-10-05 MED ORDER — WARFARIN SODIUM 3 MG PO TABS
3.0000 mg | ORAL_TABLET | Freq: Once | ORAL | Status: DC
  Filled 2018-10-05: qty 1

## 2018-10-05 MED ORDER — TIZANIDINE HCL 2 MG PO TABS: 2.0000 mg | ORAL_TABLET | Freq: Three times a day (TID) | ORAL | 0 refills | Status: DC

## 2018-10-05 MED ORDER — POTASSIUM CHLORIDE ER 10 MEQ PO TBCR: 10.0000 meq | EXTENDED_RELEASE_TABLET | Freq: Every day | ORAL | 1 refills | Status: DC

## 2018-10-05 MED ORDER — LEVOFLOXACIN 500 MG PO TABS: 500.0000 mg | ORAL_TABLET | Freq: Every day | ORAL | 0 refills | Status: AC

## 2018-10-05 NOTE — TOC Transition Note (Signed)
Transition of Care [facility]) - CM/SW Discharge Note   Patient Details  Name: Erin [name] MRN: [id number] Date of Birth: 06-19-1946  [facility] of [facility] CM/SW Contact:  Elza Rafter, RN Phone Number: 10/05/2018, 1:40 PM   Clinical Narrative:   Patient discharging today.  Corene Cornea with [facility] is aware.  Orders have been placed for home health RN and PT.  No further needs identified by case management.    Final next level of care: [place] Barriers to Discharge: No Barriers Identified   Patient Goals and CMS Choice Patient states their goals for this hospitalization and ongoing recovery are:: Stay one more day and then DC home with home health CMS Medicare.gov Compare Post Acute Care list provided to:: Patient Choice offered to / list presented to : Patient  Discharge Placement                       Discharge Plan and Services   Discharge Planning Services: CM Consult Post Acute Care Choice: Home Health              HH Arranged: RN, PT [facility] Agency: [facility] ([facility]   Social Determinants of Health (SDOH) Interventions     Readmission Risk Interventions No flowsheet data found.

## 2018-10-05 NOTE — Care Management (Signed)
Added SW to home health services as PT voiced concerns about loneliness.

## 2018-10-05 NOTE — Discharge Summary (Signed)
[facility] NAME: Erin [name]    MR#:  [id number]  DATE OF BIRTH:  [birth year]  DATE OF ADMISSION:  10/01/2018   ADMITTING PHYSICIAN: Harrie Foreman, MD  DATE OF DISCHARGE: 10/05/18  PRIMARY CARE PHYSICIAN: Creola Corn, DO   ADMISSION DIAGNOSIS:   Community acquired pneumonia of left upper lobe of lung ([place]) [J18.1] Sepsis, due to unspecified organism, unspecified whether acute organ dysfunction present ([place]) [A41.9]  DISCHARGE DIAGNOSIS:   Active Problems:   Sepsis ([place])   SECONDARY DIAGNOSIS:   Past Medical History:  Diagnosis Date  . Acute URI   . Allergy   . Anxiety   . Asthma   . Cancer ([place])    lymphoma in remission  . Diverticulosis   . DVT of upper extremity (deep vein thrombosis) ([place]) 07/2008 & 12/2007  . Gastric ulcer 10/2007   EGD  . Hepatitis B    treated  . Hypertension   . Lower extremity edema   . Lymphoma ([place])    Stage 3 large B cell lymphoma    HOSPITAL COURSE:   73 year old female with past medical history of hepatitis B, hypertension, anxiety, history of lymphoma, GERD who presented to the hospital due to fever, shortness of breath and cough.  1. Sepsis- secondary to community-acquired pneumonia. -Positive blood cultures for Streptococcus pneumoniae -Negative for COVID-19. -Has pleuritic chest pain secondary to pneumonia.  Muscle relaxants added -Received IV Rocephin.    Weaned off oxygen. -CT of the chest showing no pulmonary embolism but having left upper lobe consolidation -Follow-up chest x-ray with improvement in left upper lobe pneumonia. -Being discharged on Levaquin for another week.  Repeat blood cultures have been negative at the time of discharge.  2. GERD-continue Prilosec.  3. Depression-continue Celexa, trazodone and Remeron.  4. Hx of Hep B - cont. Entecavir  5. Neuropathy - cont. Neurontin.   6. Hx of previous DVT - cont. Coumadin.   INR is  subtherapeutic.  But patient will be on Levaquin.  Recheck INR within 1 week recommended  7.  CKD stage III-stable  8.  Hypokalemia-replaced  Ambulated very well with physical therapy Discharge home with home health today   DISCHARGE CONDITIONS:   Guarded  CONSULTS OBTAINED:   None  DRUG ALLERGIES:   Allergies  Allergen Reactions  . Penicillins Rash    Has patient had a PCN reaction causing immediate rash, facial/tongue/throat swelling, SOB or lightheadedness with hypotension: Yes Has patient had a PCN reaction causing severe rash involving mucus membranes or skin necrosis: No Has patient had a PCN reaction that required hospitalization: No Has patient had a PCN reaction occurring within the last 10 years: No If all of the above answers are "NO", then may proceed with Cephalosporin use.  . Prednisone Hives  . Sulfa Antibiotics Hives and Shortness Of Breath   DISCHARGE MEDICATIONS:   Allergies as of 10/05/2018      Reactions   Penicillins Rash   Has patient had a PCN reaction causing immediate rash, facial/tongue/throat swelling, SOB or lightheadedness with hypotension: Yes Has patient had a PCN reaction causing severe rash involving mucus membranes or skin necrosis: No Has patient had a PCN reaction that required hospitalization: No Has patient had a PCN reaction occurring within the last 10 years: No If all of the above answers are "NO", then may proceed with Cephalosporin use.   Prednisone Hives   Sulfa Antibiotics Hives, Shortness Of Breath  Medication List    STOP taking these medications   budesonide-formoterol 160-4.5 MCG/ACT inhaler Commonly known as:  Symbicort Replaced by:  mometasone-formoterol 200-5 MCG/ACT Aero   ipratropium-albuterol 0.5-2.5 (3) MG/3ML Soln Commonly known as:  DUONEB     TAKE these medications   albuterol 108 (90 Base) MCG/ACT inhaler Commonly known as:  PROVENTIL HFA;VENTOLIN HFA Inhale 1-2 puffs into the lungs every 6  (six) hours as needed for wheezing or shortness of breath.   albuterol (2.5 MG/3ML) 0.083% nebulizer solution Commonly known as:  PROVENTIL Take 3 mLs (2.5 mg total) by nebulization every 6 (six) hours as needed for wheezing or shortness of breath.   alendronate 70 MG tablet Commonly known as:  FOSAMAX Take 70 mg by mouth once a week.   Baraclude 0.5 MG tablet Generic drug:  entecavir Take 0.5 mg by mouth daily.   Calcium 500/D 500-200 MG-UNIT tablet Generic drug:  calcium-vitamin D Take 1 tablet by mouth daily with breakfast.   cetirizine 10 MG tablet Commonly known as:  ZYRTEC Take 10 mg by mouth daily.   citalopram 20 MG tablet Commonly known as:  CELEXA Take 1 tablet (20 mg total) by mouth daily.   furosemide 20 MG tablet Commonly known as:  LASIX Take 20 mg by mouth daily.   gabapentin 300 MG capsule Commonly known as:  NEURONTIN Take 300 mg by mouth 3 (three) times daily.   ipratropium 17 MCG/ACT inhaler Commonly known as:  ATROVENT HFA Inhale 2 puffs into the lungs every 6 (six) hours as needed.   levofloxacin 500 MG tablet Commonly known as:  Levaquin Take 1 tablet (500 mg total) by mouth daily for 8 days.   mirtazapine 15 MG tablet Commonly known as:  REMERON Take 15 mg by mouth at bedtime.   mometasone-formoterol 200-5 MCG/ACT Aero Commonly known as:  Dulera Inhale 2 puffs into the lungs 2 (two) times daily. Replaces:  budesonide-formoterol 160-4.5 MCG/ACT inhaler   Multi-Vitamins Tabs Take 1 tablet by mouth daily.   omeprazole 20 MG capsule Commonly known as:  PRILOSEC Take 20 mg by mouth 2 (two) times daily.   potassium chloride 10 MEQ tablet Commonly known as:  K-DUR Take 1 tablet (10 mEq total) by mouth daily. While on lasix   tiZANidine 2 MG tablet Commonly known as:  ZANAFLEX Take 1 tablet (2 mg total) by mouth 3 (three) times daily. For 10 days and then just take at bedtime What changed:    when to take this  additional  instructions   traZODone 100 MG tablet Commonly known as:  DESYREL Take 1 tablet (100 mg total) by mouth at bedtime.   Vitamin B-12 CR 1000 MCG Tbcr Take 1 tablet by mouth daily.   warfarin 1 MG tablet Commonly known as:  COUMADIN TAKE ONE TABLET BY MOUTH EVERY DAY AT 6PM What changed:  See the new instructions.        DISCHARGE INSTRUCTIONS:   1.  PCP follow-up in 1 to 2 weeks. 2.  INR check in 5 to 7 days  DIET:   Cardiac diet  ACTIVITY:   Activity as tolerated  OXYGEN:   Home Oxygen: No.  Oxygen Delivery: room air  DISCHARGE LOCATION:   home   If you experience worsening of your admission symptoms, develop shortness of breath, life threatening emergency, suicidal or homicidal thoughts you must seek medical attention immediately by calling 911 or calling your MD immediately  if symptoms less severe.  You Must read complete  instructions/literature along with all the possible adverse reactions/side effects for all the Medicines you take and that have been prescribed to you. Take any new Medicines after you have completely understood and accpet all the possible adverse reactions/side effects.   Please note  You were cared for by a hospitalist during your hospital stay. If you have any questions about your discharge medications or the care you received while you were in the hospital after you are discharged, you can call the unit and asked to speak with the hospitalist on call if the hospitalist that took care of you is not available. Once you are discharged, your primary care physician will handle any further medical issues. Please note that NO REFILLS for any discharge medications will be authorized once you are discharged, as it is imperative that you return to your primary care physician (or establish a relationship with a primary care physician if you do not have one) for your aftercare needs so that they can reassess your need for medications and monitor your lab  values.    On the day of Discharge:  VITAL SIGNS:   Blood pressure (!) 91/54, pulse (!) 112, temperature 98.6 F (37 C), temperature source Oral, resp. rate (!) 22, height 5\' 4"  (1.626 m), weight 84 kg, SpO2 95 %.  PHYSICAL EXAMINATION:    GENERAL:  73 y.o.-year-old elderly patient lying in the bed with no acute distress.  EYES: Pupils equal, round, reactive to light and accommodation. No scleral icterus. Extraocular muscles intact.  HEENT: Head atraumatic, normocephalic. Oropharynx and nasopharynx clear.  NECK:  Supple, no jugular venous distention. No thyroid enlargement, no tenderness.  LUNGS: Normal breath sounds bilaterally, no wheezing, rales,rhonchi or crepitation. No use of accessory muscles of respiration.  Decreased left basilar breath sounds CARDIOVASCULAR: S1, S2 normal. No murmurs, rubs, or gallops.  ABDOMEN: Soft, nontender, nondistended. Bowel sounds present. No organomegaly or mass.  EXTREMITIES: No pedal edema, cyanosis, or clubbing.  NEUROLOGIC: Cranial nerves II through XII are intact. Muscle strength 5/5 in all extremities. Sensation intact. Gait not checked.  Global weakness noted PSYCHIATRIC: The patient is alert and oriented x 3.  SKIN: No obvious rash, lesion, or ulcer.   DATA REVIEW:   CBC Recent Labs  Lab 10/05/18 0330  WBC 6.4  HGB 9.1*  HCT 27.7*  PLT 219    Chemistries  Recent Labs  Lab 10/01/18 0328  10/04/18 0427 10/05/18 0330  NA 128*   < > 141 140  K 2.8*   < > 2.9* 3.9  CL 94*   < > 114* 112*  CO2 21*   < > 19* 18*  GLUCOSE 146*   < > 124* 128*  BUN 17   < > 13 14  CREATININE 1.66*   < > 1.46* 1.49*  CALCIUM 7.4*   < > 7.6* 8.0*  MG  --    < > 1.8  --   AST 21  --   --   --   ALT 19  --   --   --   ALKPHOS 113  --   --   --   BILITOT 0.4  --   --   --    < > = values in this interval not displayed.     Microbiology Results  Results for orders placed or performed during the hospital encounter of 10/01/18  Blood Culture  (routine x 2)     Status: Abnormal   Collection Time: 10/01/18  3:28 AM  Result Value Ref Range Status   Specimen Description   Final    BLOOD LEFT FOREARM Performed at [facility], [address] [place], [place] [address]    Special Requests   Final    BOTTLES DRAWN AEROBIC AND ANAEROBIC Blood Culture adequate volume Performed at [facility], [address] [place], [place] [address]    Culture  Setup Time   Final    GRAM POSITIVE COCCI ANAEROBIC BOTTLE ONLY CRITICAL RESULT CALLED TO, READ BACK BY AND VERIFIED WITH: Winfield Rast PATEL @ [id number] ON 10/01/2018 BY CAF Performed at [facility], [address] [place], [place] [address]    Culture (A)  Final    STREPTOCOCCUS PNEUMONIAE SUSCEPTIBILITIES PERFORMED ON PREVIOUS CULTURE WITHIN THE LAST 5 DAYS. Performed at [facility], [place] [address], [place], [place] [address]    Report Status 10/03/2018 FINAL  Final  Blood Culture (routine x 2)     Status: Abnormal   Collection Time: 10/01/18  3:28 AM  Result Value Ref Range Status   Specimen Description   Final    BLOOD RIGHT WRIST Performed at [facility], [address], [place], [place] [address]    Special Requests   Final    BOTTLES DRAWN AEROBIC AND ANAEROBIC Blood Culture adequate volume Performed at [facility], [address] [place], [place] [address]    Culture  Setup Time   Final    Organism ID to follow [place] ONLY CRITICAL RESULT CALLED TO, READ BACK BY AND VERIFIED WITH: Winfield Rast PATEL @ [id number] ON 10/01/2018 BY CAF Performed at [facility], [address] [place], [place] [address]    Culture STREPTOCOCCUS PNEUMONIAE (A)  Final   Report Status 10/03/2018 FINAL  Final   Organism ID, Bacteria STREPTOCOCCUS PNEUMONIAE  Final      Susceptibility   Streptococcus pneumoniae - MIC*    ERYTHROMYCIN >=8 RESISTANT Resistant     LEVOFLOXACIN <=0.25 SENSITIVE Sensitive      VANCOMYCIN 0.5 SENSITIVE Sensitive     PENICILLIN (non-meningitis) 1 SENSITIVE Sensitive     CEFTRIAXONE (non-meningitis) 1 SENSITIVE Sensitive     * STREPTOCOCCUS PNEUMONIAE  Novel Coronavirus, NAA (hospital order; send-out to ref lab)     Status: None   Collection Time: 10/01/18  3:28 AM  Result Value Ref Range Status   SARS-CoV-2, NAA NOT DETECTED NOT DETECTED Final    Comment: Performed at [facility] Performed at [facility], [place] [address], [place], [place] [address]    Coronavirus Source NASOPHARYNGEAL  Final    Comment: Performed at [facility], [address] [place], [place] [address]  Respiratory Panel by PCR     Status: None   Collection Time: 10/01/18  3:28 AM  Result Value Ref Range Status   Adenovirus NOT DETECTED NOT DETECTED Final   Coronavirus 229E NOT DETECTED NOT DETECTED Final    Comment: (NOTE) The Coronavirus on the Respiratory Panel, DOES NOT test for the novel  Coronavirus (2019 nCoV)    Coronavirus HKU1 NOT DETECTED NOT DETECTED Final   Coronavirus NL63 NOT DETECTED NOT DETECTED Final   Coronavirus OC43 NOT DETECTED NOT DETECTED Final   Metapneumovirus NOT DETECTED NOT DETECTED Final   Rhinovirus / Enterovirus NOT DETECTED NOT DETECTED Final   Influenza A NOT DETECTED NOT DETECTED Final   Influenza B NOT DETECTED NOT DETECTED Final   Parainfluenza Virus 1 NOT DETECTED NOT DETECTED Final  Parainfluenza Virus 2 NOT DETECTED NOT DETECTED Final   Parainfluenza Virus 3 NOT DETECTED NOT DETECTED Final   Parainfluenza Virus 4 NOT DETECTED NOT DETECTED Final   Respiratory Syncytial Virus NOT DETECTED NOT DETECTED Final   Bordetella pertussis NOT DETECTED NOT DETECTED Final   Chlamydophila pneumoniae NOT DETECTED NOT DETECTED Final   Mycoplasma pneumoniae NOT DETECTED NOT DETECTED Final    Comment: Performed at [facility], [place] [address], [place], [place] [address]  Blood Culture ID Panel (Reflexed)     Status: Abnormal    Collection Time: 10/01/18  3:28 AM  Result Value Ref Range Status   Enterococcus species NOT DETECTED NOT DETECTED Final   Listeria monocytogenes NOT DETECTED NOT DETECTED Final   Staphylococcus species NOT DETECTED NOT DETECTED Final   Staphylococcus aureus (BCID) NOT DETECTED NOT DETECTED Final   Streptococcus species DETECTED (A) NOT DETECTED Final    Comment: CRITICAL RESULT CALLED TO, READ BACK BY AND VERIFIED WITH: KISHAN PATEL @ 6045 ON 10/01/2018 BY CAF    Streptococcus agalactiae NOT DETECTED NOT DETECTED Final   Streptococcus pneumoniae DETECTED (A) NOT DETECTED Final    Comment: CRITICAL RESULT CALLED TO, READ BACK BY AND VERIFIED WITH: KISHAN PATEL @ [phone] ON 10/01/2018 BY CAF    Streptococcus pyogenes NOT DETECTED NOT DETECTED Final   Acinetobacter baumannii NOT DETECTED NOT DETECTED Final   Enterobacteriaceae species NOT DETECTED NOT DETECTED Final   Enterobacter cloacae complex NOT DETECTED NOT DETECTED Final   Escherichia coli NOT DETECTED NOT DETECTED Final   Klebsiella oxytoca NOT DETECTED NOT DETECTED Final   Klebsiella pneumoniae NOT DETECTED NOT DETECTED Final   Proteus species NOT DETECTED NOT DETECTED Final   Serratia marcescens NOT DETECTED NOT DETECTED Final   Haemophilus influenzae NOT DETECTED NOT DETECTED Final   Neisseria meningitidis NOT DETECTED NOT DETECTED Final   Pseudomonas aeruginosa NOT DETECTED NOT DETECTED Final   Candida albicans NOT DETECTED NOT DETECTED Final   Candida glabrata NOT DETECTED NOT DETECTED Final   Candida krusei NOT DETECTED NOT DETECTED Final   Candida parapsilosis NOT DETECTED NOT DETECTED Final   Candida tropicalis NOT DETECTED NOT DETECTED Final    Comment: Performed at [facility], [address] [place], [place] [address]  CULTURE, BLOOD (ROUTINE X 2) w Reflex to ID Panel     Status: None (Preliminary result)   Collection Time: 10/04/18  2:28 PM  Result Value Ref Range Status   Specimen Description BLOOD BLOOD  LEFT ARM  Final   Special Requests   Final    BOTTLES DRAWN AEROBIC AND ANAEROBIC Blood Culture adequate volume   Culture   Final    NO GROWTH < 24 HOURS Performed at [facility], [address] [place], [place] [address]    Report Status PENDING  Incomplete  CULTURE, BLOOD (ROUTINE X 2) w Reflex to ID Panel     Status: None (Preliminary result)   Collection Time: 10/04/18  2:28 PM  Result Value Ref Range Status   Specimen Description BLOOD BLOOD RIGHT ARM  Final   Special Requests   Final    BOTTLES DRAWN AEROBIC AND ANAEROBIC Blood Culture results may not be optimal due to an inadequate volume of blood received in culture bottles   Culture   Final    NO GROWTH < 24 HOURS Performed at [facility], [address], [place], [place] [address]    Report Status PENDING  Incomplete    RADIOLOGY:  Dg Chest 2 View  Result Date: 10/05/2018 CLINICAL DATA:  Pneumonia. EXAM: CHEST - 2 VIEW COMPARISON:  10/03/2018 FINDINGS: Density of left upper lobe consolidation appears significantly improved with some residual airspace density remaining. Improved aeration at the left lung base with mild atelectasis remaining. Probable small left pleural effusion. Mild atelectasis at the right lung base. No edema or pneumothorax identified. Stable positioning of Port-A-Cath. The heart size is normal. IMPRESSION: Significant improvement in density of left upper lobe pneumonia. Electronically Signed   By: Aletta Edouard M.D.   On: 10/05/2018 08:24     Management plans discussed with the patient, family and they are in agreement.  CODE STATUS:     Code Status Orders  (From admission, onward)         Start     Ordered   10/01/18 0609  Full code  Continuous     10/01/18 0608        Code Status History    Date Active Date Inactive Code Status Order ID Comments User Context   10/05/2017 1652 10/08/2017 1802 Full Code [id number]  Gladstone Lighter, MD Inpatient      TOTAL TIME  TAKING CARE OF THIS PATIENT: 38 minutes.    Gladstone Lighter M.D on 10/05/2018 at 2:42 PM  Between 7am to 6pm - Pager - [phone]  After 6pm go to [email or web address] - Technical brewer [facility]  Office  [phone]  CC: Primary care physician; White, Tamela Gammon, DO   Note: This dictation was prepared with Diplomatic Services operational officer dictation along with smaller phrase technology. Any transcriptional errors that result from this process are unintentional.

## 2018-10-05 NOTE — Consult Note (Signed)
ANTICOAGULATION CONSULT NOTE - Initial Consult  Pharmacy Consult for Warfarin Dosing and Monitoring  Indication: atrial fibrillation  Allergies  Allergen Reactions  . Penicillins Rash    Has patient had a PCN reaction causing immediate rash, facial/tongue/throat swelling, SOB or lightheadedness with hypotension: Yes Has patient had a PCN reaction causing severe rash involving mucus membranes or skin necrosis: No Has patient had a PCN reaction that required hospitalization: No Has patient had a PCN reaction occurring within the last 10 years: No If all of the above answers are "NO", then may proceed with Cephalosporin use.  . Prednisone Hives  . Sulfa Antibiotics Hives and Shortness Of Breath    Patient Measurements: Height: 5\' 4"  (162.6 cm) Weight: 185 lb 1.6 oz (84 kg) IBW/kg (Calculated) : 54.7  Vital Signs: Temp: 98.6 F (37 C) (04/09 0500) Temp Source: Oral (04/09 0500) BP: 91/54 (04/09 0500) Pulse Rate: 88 (04/09 0500)  Labs: Recent Labs    10/03/18 0315 10/04/18 0427 10/05/18 0330  HGB 9.1*  --  9.1*  HCT 27.1*  --  27.7*  PLT 213  --  219  LABPROT 17.0* 17.1* 17.2*  INR 1.4* 1.4* 1.4*  CREATININE 1.61* 1.46* 1.49*    Estimated Creatinine Clearance: 36.3 mL/min (A) (by C-G formula based on SCr of 1.49 mg/dL (H)).   Medical History: Past Medical History:  Diagnosis Date  . Acute URI   . Allergy   . Anxiety   . Asthma   . Cancer ([place])    lymphoma in remission  . Diverticulosis   . DVT of upper extremity (deep vein thrombosis) ([place]) 07/2008 & 12/2007  . Gastric ulcer 10/2007   EGD  . Hepatitis B    treated  . Hypertension   . Lower extremity edema   . Lymphoma ([place])    Stage 3 large B cell lymphoma    Assessment: Pharmacy consulted for warfarin dosing and monitoring for 73 yo female with PMH of A. Fib. INR continues to remain subtherapeutic  @ 1.4 s/p boosted dose x 4. Patient was on Levofloxacin (last dose given 4/5) and Azithromycin (4/6 x1).  She is currently on Ceftriaxone , which could increase her INR. However, this antibiotic would significantly elevate the INR. At this point, the boosted dose should begin to be reflected in the INR. It is likely that the vitamin K in the MVI in the hospital could have more than the patient's PTA MVI and this could be impacting the INR. Will boost the dose again today..   Home Regimen: Warfarin 1mg  daily   DATE INR DOSE (mg) 4/5 1.3 1.5 4/6       1.5 2 4/7 1.4 1.5 4/8  1.4 2.5 4/9  1.4  Goal of Therapy:  INR 2-3 Monitor platelets by anticoagulation protocol: Yes   Plan:  Will order warfarin 3 mg x 1 dose for 1800. Will check INRs daily while on abx per protocol.  Next INR ordered 4/9 w/AM labs.  Pharmacy will continue to monitor and order/adjsut warfarin doses as needed.   Rowland Lathe, PharmD Clinical Pharmacist 10/05/2018 7:46 AM

## 2018-10-05 NOTE — Progress Notes (Signed)
Physical Therapy Treatment Patient Details Name: Erin [name] MRN: [id number] DOB: 03-30-1946 Today's Date: 10/05/2018    History of Present Illness      PT Comments    Patient tolerated treatment well and continues to progress towards goals. She reported she was feeling okay and was willing to work with PT today. Patient was independent with bed mobility and required supervision only for transfers and ambulation. Ambulated for 500 feet with supervision, occasionally reaching for counters or walls when they are close by but without loss of balance. Patient strongly desired to discuss some personal relationships and things on her mind/heart so clinician spent extra time lending a listening ear for comfort (not billed). Patient has progressed well in functional mobility during her stay and demonstrates improvements adequate for discharge. Patient would benefit from continued physical therapy in her next setting to address remaining impairments and functional limitations to work towards stated goals and return to PLOF or maximal functional independence.    Follow Up Recommendations  Home health PT     Equipment Recommendations  None recommended by PT    Recommendations for Other Services       Precautions / Restrictions Precautions Precautions: Fall Restrictions Weight Bearing Restrictions: No    Mobility  Bed Mobility Overal bed mobility: Independent             General bed mobility comments: supine to sit without assistance  Transfers Overall transfer level: Needs assistance Equipment used: None Transfers: Sit to/from Stand Sit to Stand: Supervision         General transfer comment: sit <> stand x2 trials from edge of bed when getting ready and up for ambulation and one trial from [facility] with pericare with supervision. sit <> stand x9, x6 trials from chair with supervision  Ambulation/Gait Ambulation/Gait assistance: Supervision Gait Distance (Feet): 500  Feet Assistive device: None Gait Pattern/deviations: Decreased stride length;Step-through pattern     General Gait Details: slow pace, feels out of breath. O2 sat 94% on RA and HR  112 - 126 bpm after amb. Reaching for walls and support occasionally when fatigued and walls within reach, no loss of balance.    Stairs             Wheelchair Mobility    Modified Rankin (Stroke Patients Only)       Balance Overall balance assessment: Needs assistance Sitting-balance support: No upper extremity supported Sitting balance-[name] Scale: Normal Sitting balance - Comments: appears WNL   Standing balance support: No upper extremity supported Standing balance-[name] Scale: Good Standing balance comment: pt reaches for counters at times when close but maintains balance without UE support otherwise.                             Cognition Arousal/Alertness: Awake/alert Behavior During Therapy: WFL for tasks assessed/performed;Anxious Overall Cognitive Status: Within Functional Limits for tasks assessed                                 General Comments: seems sad to leave hospital and go home; wanted to talk about personal history and relationships. Spent additional time with patient to provide a listening ear.       Exercises Other Exercises Other Exercises: sit <> stand from chair with supervision x9, x6 reps to improve functional strength and endurance for transfers and mobility.  Other Exercises: discussed activity after going  home    General Comments        Pertinent Vitals/Pain Pain Score: 6  Pain Location: left chest when coughing Pain Intervention(s): Monitored during session    Home Living                      Prior Function            PT Goals (current goals can now be found in the care plan section) Acute Rehab PT Goals Patient Stated Goal: feel better and return home PT Goal Formulation: With patient Time For Goal  Achievement: 10/30/18 Potential to Achieve Goals: Good Progress towards PT goals: Progressing toward goals    Frequency    Min 2X/week      PT Plan Current plan remains appropriate    Co-evaluation              AM-PAC PT "6 Clicks" Mobility   Outcome Measure  Help needed turning from your back to your side while in a flat bed without using bedrails?: None Help needed moving from lying on your back to sitting on the side of a flat bed without using bedrails?: None Help needed moving to and from a bed to a chair (including a wheelchair)?: None Help needed standing up from a chair using your arms (e.g., wheelchair or bedside chair)?: None Help needed to walk in hospital room?: None Help needed climbing 3-5 steps with a railing? : Total 6 Click Score: 21    End of Session Equipment Utilized During Treatment: Gait belt Activity Tolerance: Patient tolerated treatment well;Patient limited by fatigue;Patient limited by pain Patient left: in chair;with call bell/phone within reach Nurse Communication: Mobility status(results of session, ) PT Visit Diagnosis: Muscle weakness (generalized) (M62.81);Difficulty in walking, not elsewhere classified (R26.2)     Time: 4481-8563 PT Time Calculation (min) (ACUTE ONLY): 40 min  Charges:  $Gait Training: 8-22 mins $Therapeutic Activity: 8-22 mins                     Everlean Alstrom. Graylon Good, PT, DPT 10/05/18, 2:09 PM

## 2018-10-05 NOTE — Progress Notes (Signed)
Patient is alert and oriented. VSS. PIV removed. Printed AVS given to patient in discharge packet. All discharge instructions gone over with patient at this time. No concerns voiced. All belongings packed. RN escorted pt to car via wc.  Bethann Punches, RN

## 2018-10-09 LAB — CULTURE, BLOOD (ROUTINE X 2)
Culture: NO GROWTH
Culture: NO GROWTH
Special Requests: ADEQUATE

## 2018-11-24 ENCOUNTER — Inpatient Hospital Stay: Payer: Medicare Other | Admitting: Internal Medicine

## 2018-11-24 ENCOUNTER — Inpatient Hospital Stay: Payer: Medicare Other

## 2018-12-26 ENCOUNTER — Ambulatory Visit: Payer: Medicare Other | Admitting: Internal Medicine

## 2018-12-26 ENCOUNTER — Other Ambulatory Visit: Payer: Medicare Other

## 2019-01-02 ENCOUNTER — Other Ambulatory Visit: Payer: Self-pay

## 2019-01-02 ENCOUNTER — Ambulatory Visit
Admission: RE | Admit: 2019-01-02 | Discharge: 2019-01-02 | Disposition: A | Discharge status: Home / Self Care | Payer: Medicare Other | Source: Ambulatory Visit | Attending: Internal Medicine | Admitting: Internal Medicine

## 2019-01-02 ENCOUNTER — Encounter: Payer: Self-pay | Admitting: Internal Medicine

## 2019-01-02 ENCOUNTER — Inpatient Hospital Stay (HOSPITAL_BASED_OUTPATIENT_CLINIC_OR_DEPARTMENT_OTHER): Payer: Medicare Other | Admitting: Internal Medicine

## 2019-01-02 ENCOUNTER — Inpatient Hospital Stay: Payer: Medicare Other | Attending: Internal Medicine

## 2019-01-02 VITALS — BP 118/79 | HR 83 | Temp 96.3°F | Resp 20 | Ht 64.0 in | Wt 177.6 lb

## 2019-01-02 DIAGNOSIS — R21 Rash and other nonspecific skin eruption: Secondary | ICD-10-CM | POA: Diagnosis not present

## 2019-01-02 DIAGNOSIS — Z7951 Long term (current) use of inhaled steroids: Secondary | ICD-10-CM | POA: Insufficient documentation

## 2019-01-02 DIAGNOSIS — Z8701 Personal history of pneumonia (recurrent): Secondary | ICD-10-CM | POA: Diagnosis not present

## 2019-01-02 DIAGNOSIS — Z923 Personal history of irradiation: Secondary | ICD-10-CM | POA: Insufficient documentation

## 2019-01-02 DIAGNOSIS — Z9221 Personal history of antineoplastic chemotherapy: Secondary | ICD-10-CM

## 2019-01-02 DIAGNOSIS — Z7982 Long term (current) use of aspirin: Secondary | ICD-10-CM | POA: Diagnosis not present

## 2019-01-02 DIAGNOSIS — Z452 Encounter for adjustment and management of vascular access device: Secondary | ICD-10-CM | POA: Insufficient documentation

## 2019-01-02 DIAGNOSIS — D631 Anemia in chronic kidney disease: Secondary | ICD-10-CM | POA: Diagnosis not present

## 2019-01-02 DIAGNOSIS — Z79899 Other long term (current) drug therapy: Secondary | ICD-10-CM

## 2019-01-02 DIAGNOSIS — I129 Hypertensive chronic kidney disease with stage 1 through stage 4 chronic kidney disease, or unspecified chronic kidney disease: Secondary | ICD-10-CM | POA: Diagnosis not present

## 2019-01-02 DIAGNOSIS — Z7901 Long term (current) use of anticoagulants: Secondary | ICD-10-CM | POA: Insufficient documentation

## 2019-01-02 DIAGNOSIS — C8253 Diffuse follicle center lymphoma, intra-abdominal lymph nodes: Secondary | ICD-10-CM

## 2019-01-02 DIAGNOSIS — J181 Lobar pneumonia, unspecified organism: Secondary | ICD-10-CM | POA: Diagnosis not present

## 2019-01-02 DIAGNOSIS — J189 Pneumonia, unspecified organism: Secondary | ICD-10-CM

## 2019-01-02 DIAGNOSIS — Z86718 Personal history of other venous thrombosis and embolism: Secondary | ICD-10-CM | POA: Insufficient documentation

## 2019-01-02 DIAGNOSIS — N183 Chronic kidney disease, stage 3 (moderate): Secondary | ICD-10-CM | POA: Diagnosis not present

## 2019-01-02 DIAGNOSIS — Z95828 Presence of other vascular implants and grafts: Secondary | ICD-10-CM

## 2019-01-02 LAB — CBC WITH DIFFERENTIAL/PLATELET
Abs Immature Granulocytes: 0.01 10*3/uL (ref 0.00–0.07)
Basophils Absolute: 0 10*3/uL (ref 0.0–0.1)
Basophils Relative: 1 %
Eosinophils Absolute: 0.1 10*3/uL (ref 0.0–0.5)
Eosinophils Relative: 1 %
HCT: 36.2 % (ref 36.0–46.0)
Hemoglobin: 11.9 g/dL — ABNORMAL LOW (ref 12.0–15.0)
Immature Granulocytes: 0 %
Lymphocytes Relative: 38 %
Lymphs Abs: 1.8 10*3/uL (ref 0.7–4.0)
MCH: 31.4 pg (ref 26.0–34.0)
MCHC: 32.9 g/dL (ref 30.0–36.0)
MCV: 95.5 fL (ref 80.0–100.0)
Monocytes Absolute: 0.3 10*3/uL (ref 0.1–1.0)
Monocytes Relative: 7 %
Neutro Abs: 2.4 10*3/uL (ref 1.7–7.7)
Neutrophils Relative %: 53 %
Platelets: 176 10*3/uL (ref 150–400)
RBC: 3.79 MIL/uL — ABNORMAL LOW (ref 3.87–5.11)
RDW: 13.3 % (ref 11.5–15.5)
WBC: 4.6 10*3/uL (ref 4.0–10.5)
nRBC: 0 % (ref 0.0–0.2)

## 2019-01-02 LAB — COMPREHENSIVE METABOLIC PANEL
ALT: 14 U/L (ref 0–44)
AST: 19 U/L (ref 15–41)
Albumin: 3.8 g/dL (ref 3.5–5.0)
Alkaline Phosphatase: 98 U/L (ref 38–126)
Anion gap: 7 (ref 5–15)
BUN: 21 mg/dL (ref 8–23)
CO2: 26 mmol/L (ref 22–32)
Calcium: 8.9 mg/dL (ref 8.9–10.3)
Chloride: 105 mmol/L (ref 98–111)
Creatinine, Ser: 1.31 mg/dL — ABNORMAL HIGH (ref 0.44–1.00)
GFR calc Af Amer: 47 mL/min — ABNORMAL LOW (ref >60)
GFR calc non Af Amer: 41 mL/min — ABNORMAL LOW (ref >60)
Glucose, Bld: 123 mg/dL — ABNORMAL HIGH (ref 70–99)
Potassium: 3.6 mmol/L (ref 3.5–5.1)
Sodium: 138 mmol/L (ref 135–145)
Total Bilirubin: 0.4 mg/dL (ref 0.3–1.2)
Total Protein: 7.1 g/dL (ref 6.5–8.1)

## 2019-01-02 LAB — LACTATE DEHYDROGENASE: LDH: 118 U/L (ref 98–192)

## 2019-01-02 MED ORDER — HEPARIN SOD (PORK) LOCK FLUSH 100 UNIT/ML IV SOLN
500.0000 [IU] | Freq: Once | INTRAVENOUS | Status: AC
  Administered 2019-01-02: 500 [IU] via INTRAVENOUS

## 2019-01-02 MED ORDER — SODIUM CHLORIDE 0.9% FLUSH
10.0000 mL | Freq: Once | INTRAVENOUS | Status: AC
  Administered 2019-01-02: 10 mL via INTRAVENOUS
  Filled 2019-01-02: qty 10

## 2019-01-02 NOTE — Progress Notes (Signed)
[facility] OFFICE PROGRESS NOTE  Patient Care Team: Creola Corn, DO as PCP - General (Family Medicine)   SUMMARY OF ONCOLOGIC HISTORY: Oncology History Overview Note  # Erin 2009- DLBCL [Bx- laprscopic] s/p R-CHOP x8 [finished Oct 2009]  # FEB 2015- Recurrent DLBCL [right inguinal LN Bx-; PET- Left supra-renal LN; BMBx-neg] s/p RICE x3 [Dr.Pandit]; excellent Response; not Transplant candidate  [sec to poor social support]; s/p RT to right Inguinal LN; CT OCT 2016- NED.   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  Approximately 3 months ago patient was admitted to the hospital for severe left upper lobe pneumonia/streptococcal sepsis.  Patient was also noted to have acute renal failure/worsening anemia at that time.  Call was negative  Currently she feels improved.  Shortness of breath improved.  No nausea no vomiting.  No cough.  She is gaining weight.  Patient complains of rash on the bilateral lower extremities Erin also upper extremities.  Itchy.  Denies any new medications or exposure to allergens.   Review of Systems  Constitutional: Negative for chills, diaphoresis, fever, malaise/fatigue Erin weight loss.  HENT: Negative for nosebleeds Erin sore throat.   Eyes: Negative for double vision.  Respiratory: Negative for cough, hemoptysis, sputum production, shortness of breath Erin wheezing.   Cardiovascular: Negative for chest pain, palpitations, orthopnea Erin leg swelling.  Gastrointestinal: Negative for abdominal pain, blood in stool, constipation, diarrhea, heartburn, melena, nausea Erin vomiting.  Genitourinary: Negative for dysuria, frequency Erin urgency.  Musculoskeletal: Negative for back pain Erin joint pain.  Skin: Positive  for itching Erin rash.  Neurological: Negative for dizziness, tingling, focal weakness, weakness Erin headaches.  Endo/Heme/Allergies: Does not bruise/bleed easily.  Psychiatric/Behavioral: Negative for depression. The patient is not nervous/anxious Erin does not have insomnia.       PAST MEDICAL HISTORY :  Past Medical History:  Diagnosis Date  . Acute URI   . Allergy   . Anxiety   . Asthma   . Cancer ([place])    lymphoma in remission  . Diverticulosis   . DVT of upper extremity (deep vein thrombosis) ([place]) 07/2008 & 12/2007  . Gastric ulcer 10/2007   EGD  . Hepatitis B    treated  . Hypertension   . Lower extremity edema   . Lymphoma ([place])    Stage 3 large B cell lymphoma    PAST SURGICAL HISTORY :   Past Surgical History:  Procedure Laterality Date  . CHOLECYSTECTOMY    . COLONOSCOPY  11/23/2007, 05/12/1994  . COLONOSCOPY WITH PROPOFOL N/A 03/31/2018   Procedure: COLONOSCOPY WITH PROPOFOL;  Surgeon: Manya Silvas, MD;  Location: [facility] ENDOSCOPY;  Service: Endoscopy;  Laterality: N/A;  . ESOPHAGOGASTRODUODENOSCOPY  09/03/2009, 10/14/2008, 09/30/2008, 11/23/2007  . HEMORRHOID SURGERY    . LIVER BIOPSY  01/27/1996  . PORTOCAVAL SHUNT PLACEMENT     Erin removal    FAMILY HISTORY :   Family History  Problem Relation Age of Onset  . Cancer Daughter   . Asthma Mother   . Breast cancer Neg Hx     SOCIAL HISTORY:   Social History   Tobacco Use  . Smoking status: Never Smoker  . Smokeless tobacco: Never Used  Substance Use Topics  . Alcohol use: No  .  Drug use: Never    ALLERGIES:  is allergic to penicillins; prednisone; Erin sulfa antibiotics.  MEDICATIONS:  Current Outpatient Medications  Medication Sig Dispense Refill  . albuterol (PROVENTIL HFA;VENTOLIN HFA) 108 (90 Base) MCG/ACT inhaler Inhale 1-2 puffs into the lungs every 6 (six) hours as needed for wheezing or shortness of breath. 1 Inhaler 0  . albuterol (PROVENTIL) (2.5 MG/3ML) 0.083% nebulizer solution Take 3  mLs (2.5 mg total) by nebulization every 6 (six) hours as needed for wheezing or shortness of breath. 75 mL 12  . alendronate (FOSAMAX) 70 MG tablet Take 70 mg by mouth once a week.     . calcium-vitamin D (CALCIUM 500/D) 500-200 MG-UNIT per tablet Take 1 tablet by mouth daily with breakfast.     . cetirizine (ZYRTEC) 10 MG tablet Take 10 mg by mouth daily.   11  . citalopram (CELEXA) 20 MG tablet Take 1 tablet (20 mg total) by mouth daily. 90 tablet 0  . Cyanocobalamin (VITAMIN B-12 CR) 1000 MCG TBCR Take 1 tablet by mouth daily.     Marland Kitchen entecavir (BARACLUDE) 0.5 MG tablet Take 0.5 mg by mouth daily.     . furosemide (LASIX) 20 MG tablet Take 20 mg by mouth daily.    Marland Kitchen gabapentin (NEURONTIN) 600 MG tablet Take 1 tablet by mouth at bedtime.    Marland Kitchen ipratropium (ATROVENT HFA) 17 MCG/ACT inhaler Inhale 2 puffs into the lungs every 6 (six) hours as needed. 1 Inhaler 0  . mometasone-formoterol (DULERA) 200-5 MCG/ACT AERO Inhale 2 puffs into the lungs 2 (two) times daily. 1 Inhaler 1  . Multiple Vitamin (MULTI-VITAMINS) TABS Take 1 tablet by mouth daily.     Marland Kitchen omeprazole (PRILOSEC) 20 MG capsule Take 20 mg by mouth 2 (two) times daily.   2  . tiZANidine (ZANAFLEX) 2 MG tablet Take 1 tablet (2 mg total) by mouth 3 (three) times daily. For 10 days Erin then just take at bedtime 30 tablet 0  . warfarin (COUMADIN) 1 MG tablet TAKE ONE TABLET BY MOUTH EVERY DAY AT 6PM (Patient taking differently: Take 1 mg by mouth daily. ) 90 tablet 0  . potassium chloride (K-DUR) 10 MEQ tablet Take 1 tablet (10 mEq total) by mouth daily. While on lasix (Patient not taking: Reported on 01/02/2019) 30 tablet 1   No current facility-administered medications for this visit.    Facility-Administered Medications Ordered in Other Visits  Medication Dose Route Frequency Provider Last Rate Last Dose  . sodium chloride 0.9 % injection 10 mL  10 mL Intravenous PRN Forest Gleason, MD   10 mL at 12/18/14 1121    PHYSICAL  EXAMINATION: ECOG PERFORMANCE STATUS: 0 - Asymptomatic  BP 118/79   Pulse 83   Temp (!) 96.3 F (35.7 C) (Tympanic)   Resp 20   Ht 5\' 4"  (1.626 m)   Wt 177 lb 9.6 oz (80.6 kg)   BMI 30.48 kg/m   Filed Weights   01/02/19 1355  Weight: 177 lb 9.6 oz (80.6 kg)    Physical Exam  Constitutional: She is oriented to [name], Erin [name], Erin time Erin well-developed, well-nourished, Erin in no distress.  HENT:  Head: Normocephalic Erin atraumatic.  Mouth/Throat: Oropharynx is clear Erin moist. No oropharyngeal exudate.  Eyes: Pupils are equal, round, Erin reactive to light.  Neck: Normal range of motion. Neck supple.  Cardiovascular: Normal rate Erin regular rhythm.  Pulmonary/Chest: No respiratory distress. She has no wheezes.  Abdominal: Soft. Bowel sounds are normal. She exhibits  no distension Erin no mass. There is no abdominal tenderness. There is no rebound Erin no guarding.  Musculoskeletal: Normal range of motion.        General: No tenderness or edema.  Neurological: She is alert Erin oriented to [name], Erin [name], Erin time.  Skin: Skin is warm.  Maculopapular rash noted to bilateral lower extremities Erin also upper extremities.  Psychiatric: Affect normal.    LABORATORY DATA:  I have reviewed the data as listed    Component Value Date/Time   NA 138 01/02/2019 1340   NA 142 11/20/2013 0842   K 3.6 01/02/2019 1340   K 4.0 11/20/2013 0842   CL 105 01/02/2019 1340   CL 105 11/20/2013 0842   CO2 26 01/02/2019 1340   CO2 30 11/20/2013 0842   GLUCOSE 123 (H) 01/02/2019 1340   GLUCOSE 94 11/20/2013 0842   BUN 21 01/02/2019 1340   BUN 12 11/20/2013 0842   CREATININE 1.31 (H) 01/02/2019 1340   CREATININE 1.06 05/07/2014 0937   CALCIUM 8.9 01/02/2019 1340   CALCIUM 8.3 (L) 11/20/2013 0842   PROT 7.1 01/02/2019 1340   PROT 6.9 05/07/2014 0937   ALBUMIN 3.8 01/02/2019 1340   ALBUMIN 3.5 05/07/2014 0937   AST 19 01/02/2019 1340   AST 20 05/07/2014 0937   ALT 14 01/02/2019 1340   ALT  22 05/07/2014 0937   ALKPHOS 98 01/02/2019 1340   ALKPHOS 143 (H) 05/07/2014 0937   BILITOT 0.4 01/02/2019 1340   BILITOT 0.3 05/07/2014 0937   GFRNONAA 41 (L) 01/02/2019 1340   GFRNONAA 55 (L) 05/07/2014 0937   GFRNONAA 50 (L) 02/12/2014 1104   GFRAA 47 (L) 01/02/2019 1340   GFRAA >60 05/07/2014 0937   GFRAA 58 (L) 02/12/2014 1104    No results found for: SPEP, UPEP  Lab Results  Component Value Date   WBC 4.6 01/02/2019   NEUTROABS 2.4 01/02/2019   HGB 11.9 (L) 01/02/2019   HCT 36.2 01/02/2019   MCV 95.5 01/02/2019   PLT 176 01/02/2019      Chemistry      Component Value Date/Time   NA 138 01/02/2019 1340   NA 142 11/20/2013 0842   K 3.6 01/02/2019 1340   K 4.0 11/20/2013 0842   CL 105 01/02/2019 1340   CL 105 11/20/2013 0842   CO2 26 01/02/2019 1340   CO2 30 11/20/2013 0842   BUN 21 01/02/2019 1340   BUN 12 11/20/2013 0842   CREATININE 1.31 (H) 01/02/2019 1340   CREATININE 1.06 05/07/2014 0937      Component Value Date/Time   CALCIUM 8.9 01/02/2019 1340   CALCIUM 8.3 (L) 11/20/2013 0842   ALKPHOS 98 01/02/2019 1340   ALKPHOS 143 (H) 05/07/2014 0937   AST 19 01/02/2019 1340   AST 20 05/07/2014 0937   ALT 14 01/02/2019 1340   ALT 22 05/07/2014 0937   BILITOT 0.4 01/02/2019 1340   BILITOT 0.3 05/07/2014 0937        ASSESSMENT & PLAN:   [name] follicle center lymphoma of intra-abdominal lymph nodes (HCC)  # 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  # Clinically no evidence of recurrence noted.  Stable.  Continue surveillance without imaging.  #April 2020 pneumonia with sepsis/left upper lobe.  Will recommend a chest x-ray-interval resolution.  # Port flushes- q 8 w/coumadin 1mg /day; [ patient was to keep it.].  Stable.   # Mild anemia-  Hb-11 sec to CKD/patient is on p.o. iron once a day.  Stable.  # CKD- III reatinine of 1.36;  stable/follows with nephrology.  #Skin rash-question allergic reaction.  Recommend Claritin/hydrocortisone cream.  # DISPOSITION: # CXR today- will call with results- [phone]. # port flush every 2 months .  #  follow-up in  6 months- MD/labs- cbc/cmp/ldh;port flush;Dr.B  CC; PCP     Cammie Sickle, MD 01/02/2019 9:30 PM

## 2019-01-02 NOTE — Assessment & Plan Note (Addendum)
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  # Clinically no evidence of recurrence noted.  Stable.  Continue surveillance without imaging.  #April 2020 pneumonia with sepsis/left upper lobe.  Will recommend a chest x-ray-interval resolution.  # Port flushes- q 8 w/coumadin 1mg /day; [ patient was to keep it.].  Stable.   # Mild anemia- Hb-11 sec to CKD/patient is on p.o. iron once a day.  Stable.  # CKD- III reatinine of 1.36; stable/follows with nephrology.  #Skin rash-question allergic reaction.  Recommend Claritin/hydrocortisone cream.  # DISPOSITION: # CXR today- will call with results- [phone]. # port flush every 2 months .  #  follow-up in  6 months- MD/labs- cbc/cmp/ldh;port flush;Dr.B  CC; PCP

## 2019-01-15 ENCOUNTER — Other Ambulatory Visit: Payer: Self-pay | Admitting: Family Medicine

## 2019-01-15 DIAGNOSIS — M810 Age-related osteoporosis without current pathological fracture: Secondary | ICD-10-CM

## 2019-01-15 DIAGNOSIS — Z1231 Encounter for screening mammogram for malignant neoplasm of breast: Secondary | ICD-10-CM

## 2019-01-16 ENCOUNTER — Telehealth: Payer: Self-pay | Admitting: Gastroenterology

## 2019-01-16 NOTE — Telephone Encounter (Signed)
Patient called & would like to schedule a colonoscopy.

## 2019-01-17 ENCOUNTER — Other Ambulatory Visit: Payer: Self-pay

## 2019-01-17 DIAGNOSIS — Z1211 Encounter for screening for malignant neoplasm of colon: Secondary | ICD-10-CM

## 2019-01-17 NOTE — Telephone Encounter (Signed)
PT left vm she states she would like to schedule an apt she states she has been waiting for an hour by her phone to get scheduled and she found someone to accompany her to the procedure

## 2019-01-18 ENCOUNTER — Telehealth: Payer: Self-pay

## 2019-01-18 ENCOUNTER — Other Ambulatory Visit: Payer: Self-pay | Admitting: Nephrology

## 2019-01-18 DIAGNOSIS — R319 Hematuria, unspecified: Secondary | ICD-10-CM

## 2019-01-18 DIAGNOSIS — N183 Chronic kidney disease, stage 3 unspecified: Secondary | ICD-10-CM

## 2019-01-18 NOTE — Telephone Encounter (Signed)
Colonoscopy instructions and sample PlenVu bowel prep has been provided to the patient.  I reviewed her instructions with her in detail in person.  Asked her to call the office if she has any questions.  Thanks [employer]

## 2019-01-22 ENCOUNTER — Other Ambulatory Visit: Payer: Self-pay

## 2019-01-22 ENCOUNTER — Other Ambulatory Visit
Admission: RE | Admit: 2019-01-22 | Discharge: 2019-01-22 | Disposition: A | Discharge status: Home / Self Care | Payer: Medicare Other | Source: Ambulatory Visit | Attending: Gastroenterology | Admitting: Gastroenterology

## 2019-01-22 DIAGNOSIS — Z20828 Contact with and (suspected) exposure to other viral communicable diseases: Secondary | ICD-10-CM | POA: Insufficient documentation

## 2019-01-22 LAB — SARS CORONAVIRUS 2 (TAT 6-24 HRS): SARS Coronavirus 2: NEGATIVE

## 2019-01-24 ENCOUNTER — Encounter: Payer: Self-pay | Admitting: *Deleted

## 2019-01-25 ENCOUNTER — Other Ambulatory Visit: Payer: Self-pay

## 2019-01-25 ENCOUNTER — Ambulatory Visit
Admission: RE | Admit: 2019-01-25 | Discharge: 2019-01-25 | Disposition: A | Discharge status: Home / Self Care | Payer: Medicare Other | Attending: Gastroenterology | Admitting: Gastroenterology

## 2019-01-25 ENCOUNTER — Ambulatory Visit: Payer: Medicare Other | Admitting: Certified Registered Nurse Anesthetist

## 2019-01-25 ENCOUNTER — Encounter
Admission: RE | Disposition: A | Discharge status: Home / Self Care | Payer: Self-pay | Source: Home / Self Care | Attending: Gastroenterology

## 2019-01-25 DIAGNOSIS — Z1211 Encounter for screening for malignant neoplasm of colon: Secondary | ICD-10-CM

## 2019-01-25 DIAGNOSIS — Z539 Procedure and treatment not carried out, unspecified reason: Secondary | ICD-10-CM | POA: Insufficient documentation

## 2019-01-25 SURGERY — COLONOSCOPY WITH PROPOFOL
Anesthesia: General

## 2019-01-25 MED ORDER — SODIUM CHLORIDE 0.9 % IV SOLN: INTRAVENOUS | Status: DC

## 2019-01-25 MED ORDER — PROPOFOL 500 MG/50ML IV EMUL
INTRAVENOUS | Status: AC
  Filled 2019-01-25: qty 100

## 2019-01-26 ENCOUNTER — Ambulatory Visit: Payer: Medicare Other

## 2019-01-30 NOTE — Telephone Encounter (Signed)
Close encounter 

## 2019-01-31 ENCOUNTER — Other Ambulatory Visit: Payer: Self-pay

## 2019-02-02 ENCOUNTER — Ambulatory Visit: Payer: Medicare Other

## 2019-02-22 ENCOUNTER — Other Ambulatory Visit: Payer: Self-pay

## 2019-02-22 ENCOUNTER — Telehealth: Payer: Self-pay | Admitting: Gastroenterology

## 2019-02-22 DIAGNOSIS — Z1211 Encounter for screening for malignant neoplasm of colon: Secondary | ICD-10-CM

## 2019-02-22 NOTE — Telephone Encounter (Signed)
Pt is call she was scheduled for a procedure but they would not let her have it because she was on Blood thinners she states no one told her she was not supposed to be on them she is ready to r/s her procedure now.cb [phone]

## 2019-02-22 NOTE — Telephone Encounter (Signed)
Returned patients call.  Colonoscopy has been scheduled with Dr. Vicente Males 03/16/19.  Patient has been advised to stop coumadin 4 days before her procedure and resume 4 days after.  She has also been advised regarding COVID test to be done on 03/13/19 at [facility].  Thanks [employer]

## 2019-03-06 ENCOUNTER — Inpatient Hospital Stay: Payer: Medicare Other | Attending: Internal Medicine

## 2019-03-06 DIAGNOSIS — Z7901 Long term (current) use of anticoagulants: Secondary | ICD-10-CM | POA: Insufficient documentation

## 2019-03-06 DIAGNOSIS — N183 Chronic kidney disease, stage 3 (moderate): Secondary | ICD-10-CM | POA: Insufficient documentation

## 2019-03-06 DIAGNOSIS — C8253 Diffuse follicle center lymphoma, intra-abdominal lymph nodes: Secondary | ICD-10-CM | POA: Insufficient documentation

## 2019-03-06 DIAGNOSIS — Z7951 Long term (current) use of inhaled steroids: Secondary | ICD-10-CM | POA: Insufficient documentation

## 2019-03-06 DIAGNOSIS — Z79899 Other long term (current) drug therapy: Secondary | ICD-10-CM | POA: Insufficient documentation

## 2019-03-06 DIAGNOSIS — Z8701 Personal history of pneumonia (recurrent): Secondary | ICD-10-CM | POA: Insufficient documentation

## 2019-03-06 DIAGNOSIS — Z7982 Long term (current) use of aspirin: Secondary | ICD-10-CM | POA: Insufficient documentation

## 2019-03-06 DIAGNOSIS — Z452 Encounter for adjustment and management of vascular access device: Secondary | ICD-10-CM | POA: Insufficient documentation

## 2019-03-06 DIAGNOSIS — Z9221 Personal history of antineoplastic chemotherapy: Secondary | ICD-10-CM | POA: Insufficient documentation

## 2019-03-06 DIAGNOSIS — Z923 Personal history of irradiation: Secondary | ICD-10-CM | POA: Insufficient documentation

## 2019-03-06 DIAGNOSIS — D631 Anemia in chronic kidney disease: Secondary | ICD-10-CM | POA: Insufficient documentation

## 2019-03-09 ENCOUNTER — Telehealth: Payer: Self-pay | Admitting: Gastroenterology

## 2019-03-09 NOTE — Telephone Encounter (Signed)
Patient called to cancel  Colonoscopy on 03-16-19 dur to no transportation. Will call back if she can find someone to bring her.

## 2019-03-13 ENCOUNTER — Other Ambulatory Visit: Payer: Medicare Other | Attending: Gastroenterology

## 2019-03-13 NOTE — Telephone Encounter (Signed)
Patient contacted office to cancel her colonoscopy due to transportation.  Her colonoscopy has been canceled.  Trish in Endo has been informed.  Thanks [employer]

## 2019-03-16 ENCOUNTER — Ambulatory Visit: Admission: RE | Admit: 2019-03-16 | Payer: Medicare Other | Source: Home / Self Care | Admitting: Gastroenterology

## 2019-03-16 ENCOUNTER — Encounter: Admission: RE | Payer: Self-pay | Source: Home / Self Care

## 2019-03-16 SURGERY — COLONOSCOPY WITH PROPOFOL
Anesthesia: General

## 2019-03-19 ENCOUNTER — Other Ambulatory Visit: Payer: Self-pay

## 2019-03-19 ENCOUNTER — Ambulatory Visit
Admission: RE | Admit: 2019-03-19 | Discharge: 2019-03-19 | Disposition: A | Discharge status: Home / Self Care | Payer: Medicare Other | Source: Ambulatory Visit | Attending: Nephrology | Admitting: Nephrology

## 2019-03-19 DIAGNOSIS — R319 Hematuria, unspecified: Secondary | ICD-10-CM | POA: Diagnosis present

## 2019-03-19 DIAGNOSIS — N183 Chronic kidney disease, stage 3 unspecified: Secondary | ICD-10-CM

## 2019-03-23 ENCOUNTER — Other Ambulatory Visit: Payer: Self-pay

## 2019-03-23 ENCOUNTER — Inpatient Hospital Stay: Payer: Medicare Other

## 2019-03-23 DIAGNOSIS — C8253 Diffuse follicle center lymphoma, intra-abdominal lymph nodes: Secondary | ICD-10-CM | POA: Diagnosis present

## 2019-03-23 DIAGNOSIS — Z452 Encounter for adjustment and management of vascular access device: Secondary | ICD-10-CM | POA: Diagnosis present

## 2019-03-23 DIAGNOSIS — Z9221 Personal history of antineoplastic chemotherapy: Secondary | ICD-10-CM | POA: Diagnosis not present

## 2019-03-23 DIAGNOSIS — Z7951 Long term (current) use of inhaled steroids: Secondary | ICD-10-CM | POA: Diagnosis not present

## 2019-03-23 DIAGNOSIS — Z7901 Long term (current) use of anticoagulants: Secondary | ICD-10-CM | POA: Diagnosis not present

## 2019-03-23 DIAGNOSIS — Z923 Personal history of irradiation: Secondary | ICD-10-CM | POA: Diagnosis not present

## 2019-03-23 DIAGNOSIS — Z8701 Personal history of pneumonia (recurrent): Secondary | ICD-10-CM | POA: Diagnosis not present

## 2019-03-23 DIAGNOSIS — Z7982 Long term (current) use of aspirin: Secondary | ICD-10-CM | POA: Diagnosis not present

## 2019-03-23 DIAGNOSIS — Z79899 Other long term (current) drug therapy: Secondary | ICD-10-CM | POA: Diagnosis not present

## 2019-03-23 DIAGNOSIS — D631 Anemia in chronic kidney disease: Secondary | ICD-10-CM | POA: Diagnosis not present

## 2019-03-23 DIAGNOSIS — Z95828 Presence of other vascular implants and grafts: Secondary | ICD-10-CM

## 2019-03-23 DIAGNOSIS — N183 Chronic kidney disease, stage 3 (moderate): Secondary | ICD-10-CM | POA: Diagnosis not present

## 2019-03-23 MED ORDER — HEPARIN SOD (PORK) LOCK FLUSH 100 UNIT/ML IV SOLN
500.0000 [IU] | Freq: Once | INTRAVENOUS | Status: AC
  Administered 2019-03-23: 500 [IU] via INTRAVENOUS

## 2019-03-23 MED ORDER — SODIUM CHLORIDE 0.9% FLUSH
10.0000 mL | Freq: Once | INTRAVENOUS | Status: AC
  Administered 2019-03-23: 10 mL via INTRAVENOUS
  Filled 2019-03-23: qty 10

## 2019-04-11 ENCOUNTER — Other Ambulatory Visit: Payer: Self-pay | Admitting: Nephrology

## 2019-04-11 DIAGNOSIS — N1330 Unspecified hydronephrosis: Secondary | ICD-10-CM

## 2019-04-19 ENCOUNTER — Other Ambulatory Visit: Payer: Self-pay

## 2019-04-19 ENCOUNTER — Ambulatory Visit
Admission: RE | Admit: 2019-04-19 | Discharge: 2019-04-19 | Disposition: A | Discharge status: Home / Self Care | Payer: Medicare Other | Source: Ambulatory Visit | Attending: Nephrology | Admitting: Nephrology

## 2019-04-19 DIAGNOSIS — N1330 Unspecified hydronephrosis: Secondary | ICD-10-CM | POA: Diagnosis not present

## 2019-05-08 ENCOUNTER — Inpatient Hospital Stay: Payer: Medicare Other

## 2019-05-10 ENCOUNTER — Emergency Department
Admission: EM | Admit: 2019-05-10 | Discharge: 2019-05-11 | Disposition: A | Discharge status: Home / Self Care | Payer: Medicare Other | Attending: Emergency Medicine | Admitting: Emergency Medicine

## 2019-05-10 ENCOUNTER — Other Ambulatory Visit: Payer: Self-pay

## 2019-05-10 ENCOUNTER — Inpatient Hospital Stay: Payer: Medicare Other | Attending: Internal Medicine

## 2019-05-10 ENCOUNTER — Emergency Department: Payer: Medicare Other

## 2019-05-10 ENCOUNTER — Encounter: Payer: Self-pay | Admitting: Emergency Medicine

## 2019-05-10 DIAGNOSIS — Z79899 Other long term (current) drug therapy: Secondary | ICD-10-CM | POA: Diagnosis not present

## 2019-05-10 DIAGNOSIS — S8992XA Unspecified injury of left lower leg, initial encounter: Secondary | ICD-10-CM | POA: Diagnosis present

## 2019-05-10 DIAGNOSIS — Z86718 Personal history of other venous thrombosis and embolism: Secondary | ICD-10-CM | POA: Diagnosis not present

## 2019-05-10 DIAGNOSIS — Y939 Activity, unspecified: Secondary | ICD-10-CM | POA: Insufficient documentation

## 2019-05-10 DIAGNOSIS — S83412A Sprain of medial collateral ligament of left knee, initial encounter: Secondary | ICD-10-CM | POA: Diagnosis not present

## 2019-05-10 DIAGNOSIS — Z880 Allergy status to penicillin: Secondary | ICD-10-CM | POA: Insufficient documentation

## 2019-05-10 DIAGNOSIS — Y999 Unspecified external cause status: Secondary | ICD-10-CM | POA: Diagnosis not present

## 2019-05-10 DIAGNOSIS — Z452 Encounter for adjustment and management of vascular access device: Secondary | ICD-10-CM | POA: Insufficient documentation

## 2019-05-10 DIAGNOSIS — Z888 Allergy status to other drugs, medicaments and biological substances status: Secondary | ICD-10-CM | POA: Insufficient documentation

## 2019-05-10 DIAGNOSIS — S86112A Strain of other muscle(s) and tendon(s) of posterior muscle group at lower leg level, left leg, initial encounter: Secondary | ICD-10-CM | POA: Insufficient documentation

## 2019-05-10 DIAGNOSIS — Y929 Unspecified place or not applicable: Secondary | ICD-10-CM | POA: Insufficient documentation

## 2019-05-10 DIAGNOSIS — S8002XA Contusion of left knee, initial encounter: Secondary | ICD-10-CM | POA: Insufficient documentation

## 2019-05-10 DIAGNOSIS — I10 Essential (primary) hypertension: Secondary | ICD-10-CM | POA: Diagnosis not present

## 2019-05-10 DIAGNOSIS — J45909 Unspecified asthma, uncomplicated: Secondary | ICD-10-CM | POA: Diagnosis not present

## 2019-05-10 DIAGNOSIS — W1840XA Slipping, tripping and stumbling without falling, unspecified, initial encounter: Secondary | ICD-10-CM | POA: Insufficient documentation

## 2019-05-10 DIAGNOSIS — C8253 Diffuse follicle center lymphoma, intra-abdominal lymph nodes: Secondary | ICD-10-CM | POA: Insufficient documentation

## 2019-05-10 DIAGNOSIS — Z882 Allergy status to sulfonamides status: Secondary | ICD-10-CM | POA: Insufficient documentation

## 2019-05-10 DIAGNOSIS — Z95828 Presence of other vascular implants and grafts: Secondary | ICD-10-CM

## 2019-05-10 MED ORDER — HEPARIN SOD (PORK) LOCK FLUSH 100 UNIT/ML IV SOLN
500.0000 [IU] | Freq: Once | INTRAVENOUS | Status: AC
  Administered 2019-05-10: 500 [IU] via INTRAVENOUS

## 2019-05-10 MED ORDER — SODIUM CHLORIDE 0.9% FLUSH
10.0000 mL | Freq: Once | INTRAVENOUS | Status: AC
  Administered 2019-05-10: 10 mL via INTRAVENOUS
  Filled 2019-05-10: qty 10

## 2019-05-10 NOTE — ED Triage Notes (Signed)
Pt to triage via w/c with no distress noted, mask in place; pt st "my son was helping get down out of truck, I lost my balance and twisted my knee"; c/o persistent pain to left knee since

## 2019-05-10 NOTE — ED Triage Notes (Signed)
Pt fell x 4 hrs ago, hyperextended L knee. Pt's son caught her before she hit the ground. Pt c/o swelling, pain and decreased RoM to L knee. Pt unable to bear weight on L knee. Pt's normal is ambulatory w/o assist or device.

## 2019-05-11 ENCOUNTER — Emergency Department: Payer: Medicare Other

## 2019-05-11 DIAGNOSIS — S83412A Sprain of medial collateral ligament of left knee, initial encounter: Secondary | ICD-10-CM | POA: Diagnosis not present

## 2019-05-11 MED ORDER — OXYCODONE-ACETAMINOPHEN 5-325 MG PO TABS: 1.0000 | ORAL_TABLET | ORAL | 0 refills | Status: DC | PRN

## 2019-05-11 MED ORDER — OXYCODONE-ACETAMINOPHEN 5-325 MG PO TABS
1.0000 | ORAL_TABLET | Freq: Once | ORAL | Status: AC
  Administered 2019-05-11: 1 via ORAL
  Filled 2019-05-11: qty 1

## 2019-05-11 NOTE — ED Notes (Signed)
Patient transported to MRI 

## 2019-05-11 NOTE — ED Provider Notes (Signed)
[facility] Emergency Department Provider Note _______   First MD Initiated Contact with Patient 05/10/19 2355     (approximate)  I have reviewed the triage vital signs and the nursing notes.   HISTORY  Chief Complaint Knee Pain   HPI Erin [name] is a 73 y.o. female with below list of previous medical conditions presents to the emergency department following accidental fall with resultant left knee pain.  Patient states that she felt a pop in her knee with resultant 10 out of 10 pain and intermittent ability to bear weight on that leg since the event.       Past Medical History:  Diagnosis Date  . Acute URI   . Allergy   . Anxiety   . Asthma   . Cancer ([place])    lymphoma in remission  . Diverticulosis   . DVT of upper extremity (deep vein thrombosis) ([place]) 07/2008 & 12/2007  . Gastric ulcer 10/2007   EGD  . Hepatitis B    treated  . Hypertension   . Lower extremity edema   . 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 Sepsis ([place]) 10/01/2018  . Asthma exacerbation 10/05/2017  . 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 Disease of liver 12/18/2014  . Acute URI 11/07/2014  . BP (high blood pressure) 05/09/2014  . Chronic hepatitis B virus infection ([place]) 03/27/2011  . Lymphoma ([place]) 10/27/2007    Past Surgical History:  Procedure Laterality Date  . CHOLECYSTECTOMY    . COLONOSCOPY  11/23/2007, 05/12/1994  . COLONOSCOPY WITH PROPOFOL N/A 03/31/2018   Procedure: COLONOSCOPY WITH PROPOFOL;  Surgeon: Manya Silvas, MD;  Location: [facility] ENDOSCOPY;  Service: Endoscopy;  Laterality: N/A;  . ESOPHAGOGASTRODUODENOSCOPY  09/03/2009, 10/14/2008, 09/30/2008, 11/23/2007  . HEMORRHOID SURGERY    . LIVER BIOPSY  01/27/1996  . PORTOCAVAL SHUNT PLACEMENT     and removal    Prior to Admission medications   Medication Sig Start Date End Date  Taking? Authorizing Provider  albuterol (PROVENTIL HFA;VENTOLIN HFA) 108 (90 Base) MCG/ACT inhaler Inhale 1-2 puffs into the lungs every 6 (six) hours as needed for wheezing or shortness of breath. 10/08/17   Salary, Avel Peace, MD  albuterol (PROVENTIL) (2.5 MG/3ML) 0.083% nebulizer solution Take 3 mLs (2.5 mg total) by nebulization every 6 (six) hours as needed for wheezing or shortness of breath. 08/06/18   Nance Pear, MD  alendronate (FOSAMAX) 70 MG tablet Take 70 mg by mouth once a week.  03/26/15   [provider]  calcium-vitamin D (CALCIUM 500/D) 500-200 MG-UNIT per tablet Take 1 tablet by mouth daily with breakfast.     [provider]  cetirizine (ZYRTEC) 10 MG tablet Take 10 mg by mouth daily.     [provider]  citalopram (CELEXA) 20 MG tablet Take 1 tablet (20 mg total) by mouth daily. 10/09/17   Salary, Avel Peace, MD  Cyanocobalamin (VITAMIN B-12 CR) 1000 MCG TBCR Take 1 tablet by mouth daily.     [provider]  entecavir (BARACLUDE) 0.5 MG tablet Take 0.5 mg by mouth daily.     [provider]  furosemide (LASIX) 20 MG tablet Take 20 mg by mouth daily. 07/22/18   [provider]  gabapentin (NEURONTIN) 600 MG tablet Take 1 tablet by mouth at bedtime. 12/26/18   [provider]  ipratropium (ATROVENT HFA) 17 MCG/ACT inhaler Inhale 2 puffs into the lungs every 6 (six) hours as needed. 10/08/17   Salary, Avel Peace, MD  mometasone-formoterol (DULERA) 200-5 MCG/ACT AERO Inhale 2 puffs into the lungs 2 (two) times daily. 10/05/18   Gladstone Lighter, MD  Multiple Vitamin (MULTI-VITAMINS) TABS Take 1 tablet by mouth daily.     [provider]  omeprazole (PRILOSEC) 20 MG capsule Take 20 mg by mouth 2 (two) times daily.     [provider]  oxyCODONE-acetaminophen (PERCOCET) 5-325 MG tablet Take 1 tablet by mouth every 4 (four) hours as needed. 05/11/19 05/10/20  Gregor Hams, MD  potassium chloride (K-DUR)  10 MEQ tablet Take 1 tablet (10 mEq total) by mouth daily. While on lasix Patient not taking: Reported on 01/02/2019 10/05/18   Gladstone Lighter, MD  tiZANidine (ZANAFLEX) 2 MG tablet Take 1 tablet (2 mg total) by mouth 3 (three) times daily. For 10 days and then just take at bedtime 10/05/18   Gladstone Lighter, MD  warfarin (COUMADIN) 1 MG tablet TAKE ONE TABLET BY MOUTH EVERY DAY AT 6PM Patient taking differently: Take 1 mg by mouth daily.  06/29/17   Cammie Sickle, MD    Allergies Penicillins, Prednisone, and Sulfa antibiotics  Family History  Problem Relation Age of Onset  . Cancer Daughter   . Asthma Mother   . Breast cancer Neg Hx     Social History Social History   Tobacco Use  . Smoking status: Never Smoker  . Smokeless tobacco: Never Used  Substance Use Topics  . Alcohol use: No  . Drug use: Never    Review of Systems Constitutional: No fever/chills Eyes: No visual changes. ENT: No sore throat. Cardiovascular: Denies chest pain. Respiratory: Denies shortness of breath. Gastrointestinal: No abdominal pain.  No nausea, no vomiting.  No diarrhea.  No constipation. Genitourinary: Negative for dysuria. Musculoskeletal: Negative for neck pain.  Negative for back pain. Integumentary: Negative for rash. Neurological: Negative for headaches, focal weakness or numbness.   ____________________________________________   PHYSICAL EXAM:  VITAL SIGNS: ED Triage Vitals  Enc Vitals Group     BP 05/10/19 2214 100/62     Pulse Rate 05/10/19 2214 87     Resp 05/10/19 2214 20     Temp 05/10/19 2214 99.1 F (37.3 C)     Temp Source 05/10/19 2214 Oral     SpO2 05/10/19 2209 95 %     Weight 05/10/19 2215 81.6 kg (180 lb)     Height 05/10/19 2215 1.676 m (5\' 6" )     Head Circumference --      Peak Flow --      Pain Score 05/10/19 2214 10     Pain Loc --      Pain Edu? --      Excl. in [place]? --     Constitutional: Alert and oriented.  Eyes: Conjunctivae are normal.   Head: Atraumatic. Mouth/Throat: Patient is wearing a mask. Neck: No stridor.  No meningeal signs.   Cardiovascular: Normal rate, regular rhythm. Good peripheral circulation. Grossly normal heart sounds. Respiratory: Normal respiratory effort.  No retractions. Gastrointestinal: Soft and nontender. No distention.  Musculoskeletal: Diffuse left knee pain with gentle palpation. Neurologic:  Normal speech and language. No gross focal neurologic deficits are appreciated.  Skin:  Skin is warm, dry and intact. Psychiatric: Mood and affect are normal. Speech and behavior are normal.   RADIOLOGY I, [place], personally viewed and evaluated these  images (plain radiographs) as part of my medical decision making, as well as reviewing the written report by the radiologist.  ED MD interpretation: Grade 1 medial collateral ligamentous strain osseous contusions of the medial tibia and femoral condyle partial tear of the left gastroc head  Official radiology report(s): Mr Knee Left Wo Contrast  Result Date: 05/11/2019 CLINICAL DATA:  Knee pain after a fall EXAM: MRI OF THE LEFT KNEE WITHOUT CONTRAST TECHNIQUE: Multiplanar, multisequence MR imaging of the knee was performed. No intravenous contrast was administered. COMPARISON:  May 10, 2019 FINDINGS: MENISCI Medial: There is undersurface fraying and blunting of the posterior horn of the medial meniscus. No meniscal tear seen. Lateral: There is increased intrasubstance signal seen within the posterior horn of the lateral meniscus at the root junction with blunting and fraying. LIGAMENTS Cruciates: The ACL is intact. The PCL is intact. Collaterals: There is increased intrasubstance signal seen at the [facility] insertion site with mild signal changes in the deep fibers. The lateral collateral ligamentous complex is intact. CARTILAGE Patellofemoral: There is slight lateral subluxation of the patella. There is near complete cartilage loss seen within the  central patellar apex and the lateral patellar facet with subchondral cystic changes. There is also deep chondral fissuring in the lateral trochlear surface. Medial compartment: There is chondral fissuring and diffuse chondral thinning seen the weight-bearing surface of the medial femoral condyle. There is chondral thinning seen throughout the medial tibial plateau. Lateral compartment: There is mild chondral thinning seen within the lateral tibial plateau. BONES: Increased marrow signal seen in the posterior medial tibia and the posterior medial femoral condyle. No osseous fracture however is noted. No avascular necrosis. No pathologic marrow infiltration. JOINT: There is a moderate knee joint effusion. Edema seen within the Hoffa's fat pad. EXTENSOR MECHANISM: The patellar and quadriceps tendon are intact. Somewhat limited visualization due to patient motion. There is mildly increased signal seen around the medial patellar retinaculum. POPLITEAL FOSSA: A small loculated popliteal cyst is present with evidence of recent partial rupture. OTHER: Edema seen within the popliteus muscle belly. There is also a partial insertional tear seen at the insertion site of the medial head of the gastrocnemius. Edema seen within the intracondylar notch. IMPRESSION: 1. Intrasubstance degeneration/blunting of the medial and lateral menisci. No meniscal tear. 2. Intact cruciate ligaments. 3. Grade 1 medial collateral ligamentous sprain. 4. Osseous contusions within the posterior medial tibia and femoral condyle. No osseous fracture. 5. 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 6. Intrasubstance sprain of the medial patellar retinaculum. 7. Partial insertional tear of the medial head of the gastrocnemius. 8. Loculated popliteal cyst with evidence of recent partial rupture. 9. Popliteus muscle belly edema Electronically Signed   By: Prudencio Pair M.D.   On: 05/11/2019 02:35   Dg Knee Complete 4 Views  Left  Result Date: 05/10/2019 CLINICAL DATA:  Pain EXAM: LEFT KNEE - COMPLETE 4+ VIEW COMPARISON:  None. FINDINGS: There are advanced multicompartmental degenerative changes, greatest within the lateral compartment. There is no acute displaced fracture. No dislocation. There is a small joint effusion. IMPRESSION: 1. Advanced multicompartmental degenerative changes, greatest within the lateral compartment. 2. Small joint effusion. No acute bony abnormality. Electronically Signed   By: Constance Holster M.D.   On: 05/10/2019 22:58    ____________________________________________   PROCEDURES   Procedure(s) performed (including Critical Care):  Procedures   ____________________________________________   INITIAL IMPRESSION / MDM / [place] / ED COURSE  As part of my medical decision making,  I reviewed the following data within the electronic MEDICAL RECORD NUMBER    74 year old female presented with above-stated history and physical exam concerning for an internal derangement of the knee.  X-ray revealed no acute findings other than marked degenerative changes.  MRI of the knee performed revealed the above findings.  Patient was given Percocet in the emergency department will be prescribed the same for home.  I spoke with the patient at length regarding the side effects of Percocet and advised her not to drive while taking Percocet.  Knee immobilizer applied to the patient's knee crutches given.  Patient referred to orthopedic surgery Dr. Sabra Heck  ____________________________________________  FINAL CLINICAL IMPRESSION(S) / ED DIAGNOSES  Final diagnoses:  Sprain of medial collateral ligament of left knee, initial encounter  Gastrocnemius muscle tear, left, initial encounter  Contusion of left knee, initial encounter     MEDICATIONS GIVEN DURING THIS VISIT:  Medications  oxyCODONE-acetaminophen (PERCOCET/ROXICET) 5-325 MG per tablet 1 tablet (1 tablet Oral Given 05/11/19 0026)   oxyCODONE-acetaminophen (PERCOCET/ROXICET) 5-325 MG per tablet 1 tablet (1 tablet Oral Given 05/11/19 0301)     ED Discharge Orders         Ordered    oxyCODONE-acetaminophen (PERCOCET) 5-325 MG tablet  Every 4 hours PRN     05/11/19 0252          *Please note:  Erin [name] was evaluated in Emergency Department on 05/11/2019 for the symptoms described in the history of present illness. She was evaluated in the context of the global COVID-19 pandemic, which necessitated consideration that the patient might be at risk for infection with the SARS-CoV-2 virus that causes COVID-19. Institutional protocols and algorithms that pertain to the evaluation of patients at risk for COVID-19 are in a state of rapid change based on information released by regulatory bodies including the CDC and federal and state organizations. These policies and algorithms were followed during the patient's care in the ED.  Some ED evaluations and interventions may be delayed as a result of limited staffing during the pandemic.*  Note:  This document was prepared using Dragon voice recognition software and may include unintentional dictation errors.   Gregor Hams, MD 05/11/19 [phone]

## 2019-05-11 NOTE — ED Notes (Signed)
[employer] to pick pt up in about an hour or an hour and a half

## 2019-06-21 ENCOUNTER — Emergency Department
Admission: EM | Admit: 2019-06-21 | Discharge: 2019-06-21 | Disposition: A | Discharge status: Home / Self Care | Payer: Medicare Other | Attending: Student | Admitting: Student

## 2019-06-21 ENCOUNTER — Emergency Department: Payer: Medicare Other

## 2019-06-21 ENCOUNTER — Encounter: Payer: Self-pay | Admitting: Emergency Medicine

## 2019-06-21 ENCOUNTER — Other Ambulatory Visit: Payer: Self-pay

## 2019-06-21 DIAGNOSIS — Z7901 Long term (current) use of anticoagulants: Secondary | ICD-10-CM | POA: Insufficient documentation

## 2019-06-21 DIAGNOSIS — Y939 Activity, unspecified: Secondary | ICD-10-CM | POA: Diagnosis not present

## 2019-06-21 DIAGNOSIS — S2232XA Fracture of one rib, left side, initial encounter for closed fracture: Secondary | ICD-10-CM | POA: Insufficient documentation

## 2019-06-21 DIAGNOSIS — Y92410 Unspecified street and highway as the place of occurrence of the external cause: Secondary | ICD-10-CM | POA: Insufficient documentation

## 2019-06-21 DIAGNOSIS — J45909 Unspecified asthma, uncomplicated: Secondary | ICD-10-CM | POA: Diagnosis not present

## 2019-06-21 DIAGNOSIS — Z79899 Other long term (current) drug therapy: Secondary | ICD-10-CM | POA: Insufficient documentation

## 2019-06-21 DIAGNOSIS — Y929 Unspecified place or not applicable: Secondary | ICD-10-CM | POA: Insufficient documentation

## 2019-06-21 DIAGNOSIS — Y999 Unspecified external cause status: Secondary | ICD-10-CM | POA: Insufficient documentation

## 2019-06-21 DIAGNOSIS — S29091A Other injury of muscle and tendon of front wall of thorax, initial encounter: Secondary | ICD-10-CM | POA: Diagnosis present

## 2019-06-21 DIAGNOSIS — I10 Essential (primary) hypertension: Secondary | ICD-10-CM | POA: Insufficient documentation

## 2019-06-21 MED ORDER — OXYCODONE HCL 5 MG PO TABS
5.0000 mg | ORAL_TABLET | Freq: Once | ORAL | Status: AC
  Administered 2019-06-21: 5 mg via ORAL
  Filled 2019-06-21: qty 1

## 2019-06-21 MED ORDER — OXYCODONE-ACETAMINOPHEN 5-325 MG PO TABS: 1.0000 | ORAL_TABLET | Freq: Four times a day (QID) | ORAL | 0 refills | Status: DC | PRN

## 2019-06-21 MED ORDER — NAPROXEN 500 MG PO TABS
500.0000 mg | ORAL_TABLET | Freq: Once | ORAL | Status: AC
  Administered 2019-06-21: 500 mg via ORAL
  Filled 2019-06-21: qty 1

## 2019-06-21 NOTE — Discharge Instructions (Signed)
Use the incentive spirometer every 2 hours to help prevent pneumonia.  Follow up with your primary care provider for symptoms not improving over the next couple of weeks.
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
## 2019-06-26 ENCOUNTER — Other Ambulatory Visit: Payer: Self-pay

## 2019-06-26 ENCOUNTER — Ambulatory Visit: Payer: Self-pay

## 2019-06-26 ENCOUNTER — Inpatient Hospital Stay
Admission: EM | Admit: 2019-06-26 | Discharge: 2019-06-27 | DRG: 178 | Disposition: A | Discharge status: Home / Self Care | Payer: Medicare Other | Attending: Family Medicine | Admitting: Family Medicine

## 2019-06-26 ENCOUNTER — Encounter: Payer: Self-pay | Admitting: Emergency Medicine

## 2019-06-26 ENCOUNTER — Emergency Department: Payer: Medicare Other

## 2019-06-26 DIAGNOSIS — U071 COVID-19: Secondary | ICD-10-CM

## 2019-06-26 DIAGNOSIS — Z7901 Long term (current) use of anticoagulants: Secondary | ICD-10-CM

## 2019-06-26 DIAGNOSIS — Z825 Family history of asthma and other chronic lower respiratory diseases: Secondary | ICD-10-CM

## 2019-06-26 DIAGNOSIS — C8590 Non-Hodgkin lymphoma, unspecified, unspecified site: Secondary | ICD-10-CM | POA: Diagnosis present

## 2019-06-26 DIAGNOSIS — R0902 Hypoxemia: Secondary | ICD-10-CM | POA: Diagnosis present

## 2019-06-26 DIAGNOSIS — J449 Chronic obstructive pulmonary disease, unspecified: Secondary | ICD-10-CM | POA: Diagnosis present

## 2019-06-26 DIAGNOSIS — N1832 Chronic kidney disease, stage 3b: Secondary | ICD-10-CM | POA: Diagnosis present

## 2019-06-26 DIAGNOSIS — N183 Chronic kidney disease, stage 3 unspecified: Secondary | ICD-10-CM

## 2019-06-26 DIAGNOSIS — Z7983 Long term (current) use of bisphosphonates: Secondary | ICD-10-CM

## 2019-06-26 DIAGNOSIS — Z888 Allergy status to other drugs, medicaments and biological substances status: Secondary | ICD-10-CM

## 2019-06-26 DIAGNOSIS — Z880 Allergy status to penicillin: Secondary | ICD-10-CM

## 2019-06-26 DIAGNOSIS — J9691 Respiratory failure, unspecified with hypoxia: Secondary | ICD-10-CM

## 2019-06-26 DIAGNOSIS — Z882 Allergy status to sulfonamides status: Secondary | ICD-10-CM

## 2019-06-26 DIAGNOSIS — J1282 Pneumonia due to coronavirus disease 2019: Secondary | ICD-10-CM

## 2019-06-26 DIAGNOSIS — Z7951 Long term (current) use of inhaled steroids: Secondary | ICD-10-CM

## 2019-06-26 DIAGNOSIS — Z8572 Personal history of non-Hodgkin lymphomas: Secondary | ICD-10-CM

## 2019-06-26 DIAGNOSIS — Z86718 Personal history of other venous thrombosis and embolism: Secondary | ICD-10-CM

## 2019-06-26 DIAGNOSIS — S2232XA Fracture of one rib, left side, initial encounter for closed fracture: Secondary | ICD-10-CM | POA: Diagnosis present

## 2019-06-26 LAB — COMPREHENSIVE METABOLIC PANEL
ALT: 19 U/L (ref 0–44)
AST: 29 U/L (ref 15–41)
Albumin: 3.7 g/dL (ref 3.5–5.0)
Alkaline Phosphatase: 111 U/L (ref 38–126)
Anion gap: 11 (ref 5–15)
BUN: 19 mg/dL (ref 8–23)
CO2: 24 mmol/L (ref 22–32)
Calcium: 8.8 mg/dL — ABNORMAL LOW (ref 8.9–10.3)
Chloride: 101 mmol/L (ref 98–111)
Creatinine, Ser: 1.54 mg/dL — ABNORMAL HIGH (ref 0.44–1.00)
GFR calc Af Amer: 39 mL/min — ABNORMAL LOW (ref >60)
GFR calc non Af Amer: 33 mL/min — ABNORMAL LOW (ref >60)
Glucose, Bld: 86 mg/dL (ref 70–99)
Potassium: 3.5 mmol/L (ref 3.5–5.1)
Sodium: 136 mmol/L (ref 135–145)
Total Bilirubin: 0.4 mg/dL (ref 0.3–1.2)
Total Protein: 7.2 g/dL (ref 6.5–8.1)

## 2019-06-26 LAB — CBC
HCT: 36.1 % (ref 36.0–46.0)
Hemoglobin: 12.1 g/dL (ref 12.0–15.0)
MCH: 32.1 pg (ref 26.0–34.0)
MCHC: 33.5 g/dL (ref 30.0–36.0)
MCV: 95.8 fL (ref 80.0–100.0)
Platelets: 146 10*3/uL — ABNORMAL LOW (ref 150–400)
RBC: 3.77 MIL/uL — ABNORMAL LOW (ref 3.87–5.11)
RDW: 13.2 % (ref 11.5–15.5)
WBC: 4.1 10*3/uL (ref 4.0–10.5)
nRBC: 0 % (ref 0.0–0.2)

## 2019-06-26 LAB — LIPASE, BLOOD: Lipase: 37 U/L (ref 11–51)

## 2019-06-26 MED ORDER — SODIUM CHLORIDE 0.9% FLUSH: 3.0000 mL | Freq: Once | INTRAVENOUS | Status: DC
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
## 2019-06-27 ENCOUNTER — Encounter: Payer: Self-pay | Admitting: Radiology

## 2019-06-27 ENCOUNTER — Emergency Department: Payer: Medicare Other

## 2019-06-27 DIAGNOSIS — Z8572 Personal history of non-Hodgkin lymphomas: Secondary | ICD-10-CM | POA: Diagnosis not present

## 2019-06-27 DIAGNOSIS — J1282 Pneumonia due to coronavirus disease 2019: Secondary | ICD-10-CM

## 2019-06-27 DIAGNOSIS — R0902 Hypoxemia: Secondary | ICD-10-CM | POA: Diagnosis present

## 2019-06-27 DIAGNOSIS — Z7901 Long term (current) use of anticoagulants: Secondary | ICD-10-CM | POA: Diagnosis not present

## 2019-06-27 DIAGNOSIS — Z7983 Long term (current) use of bisphosphonates: Secondary | ICD-10-CM | POA: Diagnosis not present

## 2019-06-27 DIAGNOSIS — J449 Chronic obstructive pulmonary disease, unspecified: Secondary | ICD-10-CM | POA: Diagnosis present

## 2019-06-27 DIAGNOSIS — Z7951 Long term (current) use of inhaled steroids: Secondary | ICD-10-CM | POA: Diagnosis not present

## 2019-06-27 DIAGNOSIS — Z888 Allergy status to other drugs, medicaments and biological substances status: Secondary | ICD-10-CM | POA: Diagnosis not present

## 2019-06-27 DIAGNOSIS — N1832 Chronic kidney disease, stage 3b: Secondary | ICD-10-CM | POA: Diagnosis present

## 2019-06-27 DIAGNOSIS — Z825 Family history of asthma and other chronic lower respiratory diseases: Secondary | ICD-10-CM | POA: Diagnosis not present

## 2019-06-27 DIAGNOSIS — S2232XA Fracture of one rib, left side, initial encounter for closed fracture: Secondary | ICD-10-CM | POA: Diagnosis present

## 2019-06-27 DIAGNOSIS — U071 COVID-19: Secondary | ICD-10-CM

## 2019-06-27 DIAGNOSIS — J1289 Other viral pneumonia: Secondary | ICD-10-CM | POA: Diagnosis not present

## 2019-06-27 DIAGNOSIS — N183 Chronic kidney disease, stage 3 unspecified: Secondary | ICD-10-CM

## 2019-06-27 DIAGNOSIS — Z880 Allergy status to penicillin: Secondary | ICD-10-CM | POA: Diagnosis not present

## 2019-06-27 DIAGNOSIS — Z86718 Personal history of other venous thrombosis and embolism: Secondary | ICD-10-CM | POA: Diagnosis not present

## 2019-06-27 DIAGNOSIS — J9691 Respiratory failure, unspecified with hypoxia: Secondary | ICD-10-CM

## 2019-06-27 DIAGNOSIS — Z882 Allergy status to sulfonamides status: Secondary | ICD-10-CM | POA: Diagnosis not present

## 2019-06-27 HISTORY — DX: COVID-19: U07.1

## 2019-06-27 LAB — HIV ANTIBODY (ROUTINE TESTING W REFLEX): HIV Screen 4th Generation wRfx: NONREACTIVE

## 2019-06-27 LAB — ABO/RH: ABO/RH(D): O POS

## 2019-06-27 LAB — PROTIME-INR
INR: 0.9 (ref 0.8–1.2)
Prothrombin Time: 12.3 seconds (ref 11.4–15.2)

## 2019-06-27 LAB — POC SARS CORONAVIRUS 2 AG: SARS Coronavirus 2 Ag: POSITIVE — AB

## 2019-06-27 LAB — TROPONIN I (HIGH SENSITIVITY): Troponin I (High Sensitivity): 5 ng/L (ref <18)

## 2019-06-27 LAB — PROCALCITONIN: Procalcitonin: <0.1 ng/mL

## 2019-06-27 MED ORDER — SODIUM CHLORIDE 0.9 % IV SOLN: 100.0000 mg | Freq: Every day | INTRAVENOUS | Status: DC

## 2019-06-27 MED ORDER — SODIUM CHLORIDE 0.9 % IV SOLN: 1.0000 g | Freq: Once | INTRAVENOUS | Status: DC

## 2019-06-27 MED ORDER — DEXAMETHASONE 4 MG PO TABS
6.0000 mg | ORAL_TABLET | ORAL | Status: DC
  Administered 2019-06-27: 6 mg via ORAL
  Filled 2019-06-27: qty 1.5

## 2019-06-27 MED ORDER — ENOXAPARIN SODIUM 40 MG/0.4ML ~~LOC~~ SOLN: 40.0000 mg | SUBCUTANEOUS | Status: DC

## 2019-06-27 MED ORDER — IOHEXOL 300 MG/ML  SOLN
75.0000 mL | Freq: Once | INTRAMUSCULAR | Status: AC | PRN
  Administered 2019-06-27: 75 mL via INTRAVENOUS

## 2019-06-27 MED ORDER — PROCHLORPERAZINE EDISYLATE 10 MG/2ML IJ SOLN
10.0000 mg | Freq: Once | INTRAMUSCULAR | Status: AC
  Administered 2019-06-27: 10 mg via INTRAVENOUS
  Filled 2019-06-27: qty 2

## 2019-06-27 MED ORDER — ONDANSETRON HCL 4 MG/2ML IJ SOLN
4.0000 mg | Freq: Four times a day (QID) | INTRAMUSCULAR | Status: DC | PRN
  Administered 2019-06-27: 4 mg via INTRAVENOUS
  Filled 2019-06-27: qty 2

## 2019-06-27 MED ORDER — SODIUM CHLORIDE 0.9 % IV SOLN
2.0000 g | INTRAVENOUS | Status: DC
  Administered 2019-06-27: 2 g via INTRAVENOUS
  Filled 2019-06-27: qty 20

## 2019-06-27 MED ORDER — GUAIFENESIN-DM 100-10 MG/5ML PO SYRP
10.0000 mL | ORAL_SOLUTION | ORAL | Status: DC | PRN
  Filled 2019-06-27: qty 10

## 2019-06-27 MED ORDER — ASCORBIC ACID 500 MG PO TABS
500.0000 mg | ORAL_TABLET | Freq: Every day | ORAL | Status: DC
  Administered 2019-06-27: 500 mg via ORAL
  Filled 2019-06-27: qty 1

## 2019-06-27 MED ORDER — SODIUM CHLORIDE 0.9 % IV SOLN
500.0000 mg | INTRAVENOUS | Status: DC
  Administered 2019-06-27: 500 mg via INTRAVENOUS

## 2019-06-27 MED ORDER — SODIUM CHLORIDE 0.9 % IV SOLN
200.0000 mg | Freq: Once | INTRAVENOUS | Status: AC
  Administered 2019-06-27: 200 mg via INTRAVENOUS
  Filled 2019-06-27: qty 200

## 2019-06-27 MED ORDER — ALBUTEROL SULFATE HFA 108 (90 BASE) MCG/ACT IN AERS
2.0000 | INHALATION_SPRAY | Freq: Four times a day (QID) | RESPIRATORY_TRACT | Status: DC
  Administered 2019-06-27 (×3): 2 via RESPIRATORY_TRACT
  Filled 2019-06-27: qty 6.7

## 2019-06-27 MED ORDER — SODIUM CHLORIDE 0.9 % IV SOLN
500.0000 mg | Freq: Once | INTRAVENOUS | Status: DC
  Filled 2019-06-27: qty 500

## 2019-06-27 MED ORDER — ADULT MULTIVITAMIN W/MINERALS CH
1.0000 | ORAL_TABLET | Freq: Every day | ORAL | Status: DC
  Administered 2019-06-27: 1 via ORAL
  Filled 2019-06-27: qty 1

## 2019-06-27 MED ORDER — ZINC SULFATE 220 (50 ZN) MG PO CAPS
220.0000 mg | ORAL_CAPSULE | Freq: Every day | ORAL | Status: DC
  Administered 2019-06-27: 220 mg via ORAL
  Filled 2019-06-27: qty 1

## 2019-06-27 MED ORDER — OXYCODONE HCL 5 MG PO TABS: 5.0000 mg | ORAL_TABLET | Freq: Four times a day (QID) | ORAL | 0 refills | Status: AC | PRN
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
## 2019-06-27 NOTE — Progress Notes (Signed)
Remdesivir - Pharmacy Brief Note   O:  ALT: 19 CXR: IMPRESSION: Increasing hazy and linear airspace disease at the bases, probable atelectasis. SpO2: 99% on RA   A/P:  Remdesivir 200 mg IVPB once followed by 100 mg IVPB daily x 4 days.   Tobie Lords, PharmD, BCPS Clinical Pharmacist 06/27/2019 2:44 AM

## 2019-06-27 NOTE — ED Notes (Signed)
Breakfast ordered for pt. Pt assisted to bedside commode. No further needs at this time.

## 2019-06-27 NOTE — ED Notes (Signed)
Patient placed on 2L, given graham crackers and ginger ale

## 2019-06-27 NOTE — ED Notes (Signed)
Patient placed on 2/L o2 per EDP Brown.
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
## 2019-06-27 NOTE — ED Notes (Signed)
Pt up to bedside toilet at this time. Steady on feet. Pt states she feels like she can go home today. Denies any difficulty breathing, or increase in weakness. Pt repositioned back in bed.

## 2019-06-27 NOTE — ED Notes (Signed)
Patient transported to CT 
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
## 2019-06-27 NOTE — TOC Progression Note (Addendum)
Transition of Care [facility]) - Progression Note    Patient Details  Name: Erin [name] MRN: [id number] Date of Birth: March 22, 1946  Transition of Care [facility]) CM/SW Contact  Anselm Pancoast, RN Phone Number: 06/27/2019, 4:18 PM  Clinical Narrative:    Confirmed patient lives in [place]. LVMM @ [phone] requesting callback to confirm any assistance available. Spoke with son who confirmed patient resides there. Son is willing to pick patient up and transport at discharge. Attempted to arrange home health care however no availability with [facility], [place], [place]. Reviewed with MD and okay to discharge without [place]. Son has discussed discharge needs with MD.         Expected Discharge Plan and Services                                                 Social Determinants of Health (SDOH) Interventions    Readmission Risk Interventions No flowsheet data found.

## 2019-06-27 NOTE — ED Notes (Signed)
Breakfast tray given to pt 

## 2019-06-27 NOTE — ED Notes (Signed)
Pt assisted to bedside commode to urinate. Pt has no further needs at this time.

## 2019-06-27 NOTE — TOC Progression Note (Signed)
Transition of Care [facility]) - Progression Note    Patient Details  Name: Erin [name] MRN: [id number] Date of Birth: [birth year]  Transition of Care [facility]) CM/SW Contact  Anselm Pancoast, RN Phone Number: 06/27/2019, 2:44 PM  Clinical Narrative:    RN CM received call from MD requesting pharmacy medication reconciliation in anticipation of discharge back to ALF today. MD has ordered PT eval to ensure patient is safe to return to previous setting. RN CM notified pharmacy of request and will follow for PT eval and discharge.        Expected Discharge Plan and Services                                                 Social Determinants of Health (SDOH) Interventions    Readmission Risk Interventions No flowsheet data found.
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
## 2019-07-02 LAB — CULTURE, BLOOD (ROUTINE X 2)
Culture: NO GROWTH
Culture: NO GROWTH
Special Requests: ADEQUATE
Special Requests: ADEQUATE

## 2019-07-03 ENCOUNTER — Inpatient Hospital Stay: Payer: Medicare Other

## 2019-07-03 ENCOUNTER — Inpatient Hospital Stay: Payer: Medicare Other | Admitting: Internal Medicine

## 2019-08-03 ENCOUNTER — Ambulatory Visit
Admission: RE | Admit: 2019-08-03 | Discharge: 2019-08-03 | Disposition: A | Discharge status: Home / Self Care | Payer: Medicare Other | Source: Ambulatory Visit | Attending: Family Medicine | Admitting: Family Medicine

## 2019-08-03 DIAGNOSIS — Z1231 Encounter for screening mammogram for malignant neoplasm of breast: Secondary | ICD-10-CM

## 2019-08-14 ENCOUNTER — Other Ambulatory Visit: Payer: Medicare Other

## 2019-08-14 ENCOUNTER — Ambulatory Visit: Payer: Medicare Other | Admitting: Internal Medicine

## 2019-08-15 ENCOUNTER — Other Ambulatory Visit: Payer: Self-pay | Admitting: *Deleted

## 2019-08-15 DIAGNOSIS — C8253 Diffuse follicle center lymphoma, intra-abdominal lymph nodes: Secondary | ICD-10-CM

## 2019-08-17 ENCOUNTER — Inpatient Hospital Stay: Payer: Medicare Other

## 2019-08-17 ENCOUNTER — Inpatient Hospital Stay: Payer: Medicare Other | Admitting: Internal Medicine

## 2019-08-29 ENCOUNTER — Inpatient Hospital Stay: Payer: Medicare Other | Admitting: Internal Medicine

## 2019-08-29 ENCOUNTER — Inpatient Hospital Stay: Payer: Medicare Other

## 2019-09-25 ENCOUNTER — Inpatient Hospital Stay: Payer: Medicare Other | Attending: Internal Medicine

## 2019-09-25 ENCOUNTER — Encounter: Payer: Self-pay | Admitting: Internal Medicine

## 2019-09-25 ENCOUNTER — Other Ambulatory Visit: Payer: Self-pay

## 2019-09-25 ENCOUNTER — Inpatient Hospital Stay (HOSPITAL_BASED_OUTPATIENT_CLINIC_OR_DEPARTMENT_OTHER): Payer: Medicare Other | Admitting: Internal Medicine

## 2019-09-25 DIAGNOSIS — C8253 Diffuse follicle center lymphoma, intra-abdominal lymph nodes: Secondary | ICD-10-CM | POA: Diagnosis not present

## 2019-09-25 DIAGNOSIS — D631 Anemia in chronic kidney disease: Secondary | ICD-10-CM | POA: Insufficient documentation

## 2019-09-25 DIAGNOSIS — J45909 Unspecified asthma, uncomplicated: Secondary | ICD-10-CM | POA: Diagnosis not present

## 2019-09-25 DIAGNOSIS — Z8572 Personal history of non-Hodgkin lymphomas: Secondary | ICD-10-CM | POA: Insufficient documentation

## 2019-09-25 DIAGNOSIS — Z8616 Personal history of COVID-19: Secondary | ICD-10-CM | POA: Insufficient documentation

## 2019-09-25 DIAGNOSIS — N183 Chronic kidney disease, stage 3 unspecified: Secondary | ICD-10-CM | POA: Diagnosis not present

## 2019-09-25 DIAGNOSIS — Z86718 Personal history of other venous thrombosis and embolism: Secondary | ICD-10-CM | POA: Diagnosis not present

## 2019-09-25 DIAGNOSIS — I10 Essential (primary) hypertension: Secondary | ICD-10-CM | POA: Insufficient documentation

## 2019-09-25 DIAGNOSIS — M549 Dorsalgia, unspecified: Secondary | ICD-10-CM | POA: Diagnosis not present

## 2019-09-25 DIAGNOSIS — Z95828 Presence of other vascular implants and grafts: Secondary | ICD-10-CM

## 2019-09-25 DIAGNOSIS — Z79899 Other long term (current) drug therapy: Secondary | ICD-10-CM | POA: Diagnosis not present

## 2019-09-25 DIAGNOSIS — Z7951 Long term (current) use of inhaled steroids: Secondary | ICD-10-CM | POA: Diagnosis not present

## 2019-09-25 DIAGNOSIS — Z452 Encounter for adjustment and management of vascular access device: Secondary | ICD-10-CM | POA: Diagnosis present

## 2019-09-25 DIAGNOSIS — Z7901 Long term (current) use of anticoagulants: Secondary | ICD-10-CM | POA: Diagnosis not present

## 2019-09-25 LAB — CBC WITH DIFFERENTIAL/PLATELET
Abs Immature Granulocytes: 0.02 10*3/uL (ref 0.00–0.07)
Basophils Absolute: 0 10*3/uL (ref 0.0–0.1)
Basophils Relative: 1 %
Eosinophils Absolute: 0.1 10*3/uL (ref 0.0–0.5)
Eosinophils Relative: 2 %
HCT: 36 % (ref 36.0–46.0)
Hemoglobin: 11.9 g/dL — ABNORMAL LOW (ref 12.0–15.0)
Immature Granulocytes: 0 %
Lymphocytes Relative: 33 %
Lymphs Abs: 1.7 10*3/uL (ref 0.7–4.0)
MCH: 31.7 pg (ref 26.0–34.0)
MCHC: 33.1 g/dL (ref 30.0–36.0)
MCV: 96 fL (ref 80.0–100.0)
Monocytes Absolute: 0.5 10*3/uL (ref 0.1–1.0)
Monocytes Relative: 9 %
Neutro Abs: 2.8 10*3/uL (ref 1.7–7.7)
Neutrophils Relative %: 55 %
Platelets: 218 10*3/uL (ref 150–400)
RBC: 3.75 MIL/uL — ABNORMAL LOW (ref 3.87–5.11)
RDW: 13.2 % (ref 11.5–15.5)
WBC: 5.2 10*3/uL (ref 4.0–10.5)
nRBC: 0 % (ref 0.0–0.2)

## 2019-09-25 LAB — COMPREHENSIVE METABOLIC PANEL
ALT: 17 U/L (ref 0–44)
AST: 23 U/L (ref 15–41)
Albumin: 3.9 g/dL (ref 3.5–5.0)
Alkaline Phosphatase: 93 U/L (ref 38–126)
Anion gap: 8 (ref 5–15)
BUN: 24 mg/dL — ABNORMAL HIGH (ref 8–23)
CO2: 26 mmol/L (ref 22–32)
Calcium: 8.8 mg/dL — ABNORMAL LOW (ref 8.9–10.3)
Chloride: 102 mmol/L (ref 98–111)
Creatinine, Ser: 1.67 mg/dL — ABNORMAL HIGH (ref 0.44–1.00)
GFR calc Af Amer: 35 mL/min — ABNORMAL LOW (ref >60)
GFR calc non Af Amer: 30 mL/min — ABNORMAL LOW (ref >60)
Glucose, Bld: 93 mg/dL (ref 70–99)
Potassium: 4 mmol/L (ref 3.5–5.1)
Sodium: 136 mmol/L (ref 135–145)
Total Bilirubin: 0.3 mg/dL (ref 0.3–1.2)
Total Protein: 7.3 g/dL (ref 6.5–8.1)

## 2019-09-25 LAB — LACTATE DEHYDROGENASE: LDH: 132 U/L (ref 98–192)

## 2019-09-25 MED ORDER — HEPARIN SOD (PORK) LOCK FLUSH 100 UNIT/ML IV SOLN
INTRAVENOUS | Status: AC
  Filled 2019-09-25: qty 5

## 2019-09-25 MED ORDER — SODIUM CHLORIDE 0.9% FLUSH
10.0000 mL | Freq: Once | INTRAVENOUS | Status: AC
  Administered 2019-09-25: 10 mL via INTRAVENOUS
  Filled 2019-09-25: qty 10

## 2019-09-25 MED ORDER — HEPARIN SOD (PORK) LOCK FLUSH 100 UNIT/ML IV SOLN
500.0000 [IU] | Freq: Once | INTRAVENOUS | Status: AC
  Administered 2019-09-25: 500 [IU] via INTRAVENOUS
  Filled 2019-09-25: qty 5
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
## 2019-11-20 ENCOUNTER — Inpatient Hospital Stay: Payer: Medicare Other | Attending: Internal Medicine

## 2019-11-20 ENCOUNTER — Other Ambulatory Visit: Payer: Self-pay

## 2019-11-20 ENCOUNTER — Telehealth: Payer: Self-pay | Admitting: *Deleted

## 2019-11-20 ENCOUNTER — Other Ambulatory Visit: Payer: Self-pay | Admitting: *Deleted

## 2019-11-20 DIAGNOSIS — Z95828 Presence of other vascular implants and grafts: Secondary | ICD-10-CM

## 2019-11-20 DIAGNOSIS — N3281 Overactive bladder: Secondary | ICD-10-CM

## 2019-11-20 DIAGNOSIS — N819 Female genital prolapse, unspecified: Secondary | ICD-10-CM

## 2019-11-20 DIAGNOSIS — C8293 Follicular lymphoma, unspecified, intra-abdominal lymph nodes: Secondary | ICD-10-CM | POA: Insufficient documentation

## 2019-11-20 DIAGNOSIS — Z452 Encounter for adjustment and management of vascular access device: Secondary | ICD-10-CM | POA: Diagnosis present

## 2019-11-20 MED ORDER — HEPARIN SOD (PORK) LOCK FLUSH 100 UNIT/ML IV SOLN
INTRAVENOUS | Status: AC
  Filled 2019-11-20: qty 5

## 2019-11-20 MED ORDER — SODIUM CHLORIDE 0.9% FLUSH
10.0000 mL | Freq: Once | INTRAVENOUS | Status: AC
  Administered 2019-11-20: 10 mL via INTRAVENOUS
  Filled 2019-11-20: qty 10

## 2019-11-20 MED ORDER — HEPARIN SOD (PORK) LOCK FLUSH 100 UNIT/ML IV SOLN
500.0000 [IU] | Freq: Once | INTRAVENOUS | Status: AC
  Administered 2019-11-20: 500 [IU] via INTRAVENOUS
  Filled 2019-11-20: qty 5

## 2019-11-20 NOTE — Telephone Encounter (Signed)
Patient arrived to clinic today and requested to speak to [facility], RN regarding a "buldge" in her vagina for 2-3 months. Patient reports overactive bladder/urinary increase and constipation. She reports that she "often has to pressure on her pelvic area" to urinate/have a BM. She denies any vaginal bleeding. She reports that her "friend was recently diagnosed with a gyn cancer." pt reports that she is very nervous that she has a "female cancer."   Per Dr. Rogue Bussing - referral patient to GYN for evaluation for possible prolapse bladder vs rectocele/vaginal pelvic exam. Patient agreeable- referral entered for patient to be scheduled with Dr. Marcelline Mates.

## 2019-11-27 ENCOUNTER — Encounter: Payer: Self-pay | Admitting: Obstetrics and Gynecology

## 2020-01-16 ENCOUNTER — Other Ambulatory Visit: Payer: Self-pay

## 2020-01-16 ENCOUNTER — Ambulatory Visit (INDEPENDENT_AMBULATORY_CARE_PROVIDER_SITE_OTHER): Payer: Medicare Other | Admitting: Obstetrics and Gynecology

## 2020-01-16 ENCOUNTER — Encounter: Payer: Self-pay | Admitting: Obstetrics and Gynecology

## 2020-01-16 VITALS — BP 138/65 | HR 91 | Ht 64.0 in | Wt 182.7 lb

## 2020-01-16 DIAGNOSIS — N814 Uterovaginal prolapse, unspecified: Secondary | ICD-10-CM | POA: Diagnosis not present

## 2020-01-16 DIAGNOSIS — N3941 Urge incontinence: Secondary | ICD-10-CM

## 2020-01-16 NOTE — Progress Notes (Signed)
GYNECOLOGY CLINIC PROGRESS NOTE Subjective:    Erin [name] is a 74 y.o. [phone] female who presents as a referral from her PCP for evaluation of a pelvic organ prolapse. Problem started ~ 2-3 months ago. Symptoms include: prolapse of tissue with straining and urinary incontinence: mild to moderate, mostly urge symptoms. Notes going to the restroom ~ 5-6 times during the day, and up to 6 times at night. Symptoms have remained the same since onset.    OB History  Gravida Para Term Preterm AB Living  7 6 6   1 6   SAB TAB Ectopic Multiple Live Births  1       6    # Outcome Date GA Lbr Len/2nd Weight Sex Delivery Anes PTL Lv  7 SAB 1997          6 Term           5 Term           4 Term           3 Term           2 Term           1 Term              Past Medical History:  Diagnosis Date  . Acute URI   . Allergy   . Anxiety   . Asthma   . Cancer ([place])    lymphoma in remission  . Diverticulosis   . DVT of upper extremity (deep vein thrombosis) ([place]) 07/2008 & 12/2007  . Gastric ulcer 10/2007   EGD  . Hepatitis B    treated  . Hypertension   . Lower extremity edema   . Lymphoma ([place])    Stage 3 large B cell lymphoma    Family History  Problem Relation Age of Onset  . Cancer Daughter   . Asthma Mother   . Breast cancer Neg Hx     Past Surgical History:  Procedure Laterality Date  . CHOLECYSTECTOMY    . COLONOSCOPY  11/23/2007, 05/12/1994  . COLONOSCOPY WITH PROPOFOL N/A 03/31/2018   Procedure: COLONOSCOPY WITH PROPOFOL;  Surgeon: Manya Silvas, MD;  Location: [facility] ENDOSCOPY;  Service: Endoscopy;  Laterality: N/A;  . ESOPHAGOGASTRODUODENOSCOPY  09/03/2009, 10/14/2008, 09/30/2008, 11/23/2007  . HEMORRHOID SURGERY    . LIVER BIOPSY  01/27/1996  . PORTOCAVAL SHUNT PLACEMENT     and removal    Social History   Socioeconomic History  . Marital status: Divorced    Spouse name: Not on file  . Number of children: Not on file  . Years of education: Not on file    . Highest education level: Not on file  Occupational History  . Not on file  Tobacco Use  . Smoking status: Never Smoker  . Smokeless tobacco: Never Used  Vaping Use  . Vaping Use: Never used  Substance and Sexual Activity  . Alcohol use: No  . Drug use: Never  . Sexual activity: Not Currently  Other Topics Concern  . Not on file  Social History Narrative   Independent at baseline, ambulates without any support   Lives by herself   Social Determinants of Health   Financial Resource Strain:   . Difficulty of Paying Living Expenses:   Food Insecurity:   . Worried About Charity fundraiser in the Last Year:   . Arboriculturist in the Last Year:   Transportation Needs:   . Lack  of Transportation (Medical):   Marland Kitchen Lack of Transportation (Non-Medical):   Physical Activity:   . Days of Exercise per Week:   . Minutes of Exercise per Session:   Stress:   . Feeling of Stress :   Social Connections:   . Frequency of Communication with Friends and Family:   . Frequency of Social Gatherings with Friends and Family:   . Attends Religious Services:   . Active Member of Clubs or Organizations:   . Attends Archivist Meetings:   Marland Kitchen Marital Status:   Intimate Partner Violence:   . Fear of Current or Ex-Partner:   . Emotionally Abused:   Marland Kitchen Physically Abused:   . Sexually Abused:     Current Outpatient Medications on File Prior to Visit  Medication Sig Dispense Refill  . albuterol (PROVENTIL HFA;VENTOLIN HFA) 108 (90 Base) MCG/ACT inhaler Inhale 1-2 puffs into the lungs every 6 (six) hours as needed for wheezing or shortness of breath. 1 Inhaler 0  . alendronate (FOSAMAX) 70 MG tablet Take 70 mg by mouth once a week.     . calcium-vitamin D (CALCIUM 500/D) 500-200 MG-UNIT per tablet Take 1 tablet by mouth daily with breakfast.     . cetirizine (ZYRTEC) 10 MG tablet Take 10 mg by mouth daily.   11  . citalopram (CELEXA) 40 MG tablet Take 40 mg by mouth daily.    .  Cyanocobalamin (VITAMIN B-12 CR) 1000 MCG TBCR Take 1 tablet by mouth daily.     . enalapril (VASOTEC) 2.5 MG tablet Take 2.5 mg by mouth daily.    Marland Kitchen entecavir (BARACLUDE) 0.5 MG tablet Take 0.5 mg by mouth daily.     . furosemide (LASIX) 20 MG tablet Take 20 mg by mouth daily.    Marland Kitchen gabapentin (NEURONTIN) 600 MG tablet Take 600 mg by mouth 3 (three) times daily.     . mometasone-formoterol (DULERA) 200-5 MCG/ACT AERO Inhale 2 puffs into the lungs 2 (two) times daily. 1 Inhaler 1  . Multiple Vitamin (MULTI-VITAMINS) TABS Take 1 tablet by mouth daily.     Marland Kitchen omeprazole (PRILOSEC) 20 MG capsule Take 20 mg by mouth 2 (two) times daily.   2  . tiZANidine (ZANAFLEX) 2 MG tablet Take 2 mg by mouth 3 (three) times daily.    Marland Kitchen warfarin (COUMADIN) 1 MG tablet TAKE ONE TABLET BY MOUTH EVERY DAY AT 6PM (Patient taking differently: Take 1 mg by mouth daily. ) 90 tablet 0   Current Facility-Administered Medications on File Prior to Visit  Medication Dose Route Frequency Provider Last Rate Last Admin  . sodium chloride 0.9 % injection 10 mL  10 mL Intravenous PRN Forest Gleason, MD   10 mL at 12/18/14 1121    Allergies  Allergen Reactions  . Penicillins Rash    Has patient had a PCN reaction causing immediate rash, facial/tongue/throat swelling, SOB or lightheadedness with hypotension: Yes Has patient had a PCN reaction causing severe rash involving mucus membranes or skin necrosis: No Has patient had a PCN reaction that required hospitalization: No Has patient had a PCN reaction occurring within the last 10 years: No If all of the above answers are "NO", then may proceed with Cephalosporin use.  . Prednisone Hives  . Sulfa Antibiotics Hives and Shortness Of Breath     Review of Systems Constitutional: negative for chills, fatigue, fevers and sweats Eyes: negative for irritation, redness and visual disturbance Ears, nose, mouth, throat, and face: negative for hearing loss, nasal  congestion, snoring  and tinnitus Respiratory: negative for asthma, cough, sputum Cardiovascular: negative for chest pain, dyspnea, exertional chest pressure/discomfort, irregular heart beat, palpitations and syncope Gastrointestinal: negative for abdominal pain, change in bowel habits, nausea and vomiting Genitourinary: negative for abnormal menstrual periods, genital lesions, sexual problems and vaginal discharge, dysuria.  Positive for vaginal bulge and urinary incontinence Integument/breast: negative for breast lump, breast tenderness and nipple discharge Hematologic/lymphatic: negative for bleeding and easy bruising Musculoskeletal:negative for back pain and muscle weakness Neurological: negative for dizziness, headaches, vertigo and weakness Endocrine: negative for diabetic symptoms including polydipsia, polyuria and skin dryness Allergic/Immunologic: negative for hay fever and urticaria      Objective:     BP 138/65   Pulse 91   Ht 5\' 4"  (1.626 m)   Wt 182 lb 11.2 oz (82.9 kg)   BMI 31.36 kg/m  General appearance: alert and no distress Abdomen: soft, non-tender; bowel sounds normal; no masses,  no organomegaly Pelvic:     -  VULVA: normal appearing vulva with no masses, tenderness or lesions   -  VAGINA: atrophic, no discharge or lesions. Cystocele (small) present.    -  CERVIX: normal appearing cervix without discharge or lesions, noted ~ 1-2 cm above introitus, descends beyond the introitus on Valsalva.    - UTERUS: uterus is normal size, shape, consistency and nontender   -  ADNEXA: normal adnexa in size, nontender and no masses   -  RECTAL: rectocele noted (small). Extremities: extremities normal, atraumatic, no cyanosis or edema Neurologic: Grossly normal  Assessment:   The patient has a cystocele, rectocele and incomplete uterine prolapse. Also with urinary incontinence (stress).    Plan:    Discussed cystoceles/rectoceles and management options with the patient. All questions  answered. Neurosurgeon distributed.   Patient notes that she would like to consider surgical management. Would likely need hysterectomy with cystocele/rectocele. Briefly discussed surgical expectations and recovery.  To f/u in 2 weeks for further discussion of surgery and possible pre-op.   Rubie Maid, MD [facility]

## 2020-01-16 NOTE — Patient Instructions (Signed)
Hysterectomy Information  A hysterectomy is a surgery to remove your uterus. After surgery, you will no longer have periods. Also, you will no longer be able to get pregnant. Reasons for this surgery You may have this surgery if:  You have bleeding in your vagina: ? That is not normal. ? That does not stop, or that keeps coming back.  You have long-term (chronic) pain in your lower belly (pelvic area).  The lining of your uterus grows outside of the uterus (endometriosis).  The lining of your uterus grows in the muscle of the uterus (adenomyosis).  Your uterus falls down into your vagina (prolapse).  You have a growth in your uterus that causes problems (uterine fibroids).  You have cells that could turn into cancer (precancerous cells).  You have cancer of the uterus or cervix. Types of hysterectomies There are 3 types of hysterectomies. Depending on the type, the surgery will:  Remove the top part of the uterus (supracervical).  Remove the uterus and the cervix (total).  Remove the uterus, cervix, and tissue that holds the uterus in place (radical). Ways a hysterectomy can be done This surgery may be done in one of these ways:  A cut (incision) is made in the belly (abdomen). The uterus is taken out through the cut.  A cut is made in the vagina. The uterus is taken out through the cut.  Three or four cuts are made in the belly. A device with a camera is put through one of the cuts. The uterus is cut into pieces and taken out through the cuts or the vagina.  Three or four cuts are made in the belly. A device with a camera is put through one of the cuts. The uterus is taken out through the vagina.  Three or four cuts are made in the belly. A computer helps control the surgical tools. The uterus is cut into small pieces. The pieces are taken out through the cuts or through the vagina. Talk with your doctor about which way is best for you. Risks of hysterectomy Generally,  this surgery is safe. However, problems can happen, including:  Bleeding.  Needing donated blood (transfusion).  Blood clots.  Infection.  Damage to other structures or organs.  Allergic reactions.  Needing to switch to a different type of surgery. What to expect after surgery  You will be given pain medicine.  You will need to stay in the hospital for 1-2 days.  Follow your doctor's instructions about: ? Exercising. ? Driving. ? What activities are safe for you.  You will need to have someone with you at home for 3-5 days.  You will need to see your doctor after 2-4 weeks.  You may get hot flashes, have night sweats, and have trouble sleeping.  You may need to have Pap tests if your surgery was related to cancer. Talk with your doctor about how often you need Pap tests. Questions to ask your doctor  Do I need this surgery? Do I have other treatment options?  What are my options for this surgery?  What needs to be removed?  What are the risks?  What are the benefits?  How long will I need to stay in the hospital?  How long will I need to recover?  What symptoms can I expect after the procedure? Summary  A hysterectomy is a surgery to remove your uterus. After surgery, you will no longer have periods. Also, you will no longer be able to   get pregnant.  Talk with your doctor about which type of hysterectomy is best for you. This information is not intended to replace advice given to you by your health care provider. Make sure you discuss any questions you have with your health care provider. Document Revised: 08/17/2018 Document Reviewed: 09/14/2016 Elsevier Patient Education  2020 [employer] Inc[employer]  

## 2020-01-16 NOTE — Progress Notes (Signed)
New pt with referral due to pt having prolapse bladder and bulge in vaginal area x 2-3 months. Pt stated having burning when she urinate and pressure in the lower abd area and freq bathroom visit.

## 2020-01-17 ENCOUNTER — Encounter: Payer: Self-pay | Admitting: Obstetrics and Gynecology

## 2020-01-17 DIAGNOSIS — N3941 Urge incontinence: Secondary | ICD-10-CM | POA: Insufficient documentation

## 2020-01-17 DIAGNOSIS — N814 Uterovaginal prolapse, unspecified: Secondary | ICD-10-CM | POA: Insufficient documentation

## 2020-01-22 ENCOUNTER — Inpatient Hospital Stay: Payer: Medicare Other

## 2020-01-30 ENCOUNTER — Inpatient Hospital Stay: Payer: Medicare Other | Attending: Internal Medicine

## 2020-01-30 ENCOUNTER — Other Ambulatory Visit: Payer: Self-pay

## 2020-01-30 DIAGNOSIS — Z452 Encounter for adjustment and management of vascular access device: Secondary | ICD-10-CM | POA: Insufficient documentation

## 2020-01-30 DIAGNOSIS — C8293 Follicular lymphoma, unspecified, intra-abdominal lymph nodes: Secondary | ICD-10-CM | POA: Diagnosis present

## 2020-01-30 DIAGNOSIS — Z95828 Presence of other vascular implants and grafts: Secondary | ICD-10-CM

## 2020-01-30 MED ORDER — HEPARIN SOD (PORK) LOCK FLUSH 100 UNIT/ML IV SOLN
500.0000 [IU] | Freq: Once | INTRAVENOUS | Status: AC
  Administered 2020-01-30: 500 [IU] via INTRAVENOUS
  Filled 2020-01-30: qty 5

## 2020-01-30 MED ORDER — HEPARIN SOD (PORK) LOCK FLUSH 100 UNIT/ML IV SOLN
INTRAVENOUS | Status: AC
  Filled 2020-01-30: qty 5

## 2020-01-30 MED ORDER — SODIUM CHLORIDE 0.9% FLUSH
10.0000 mL | Freq: Once | INTRAVENOUS | Status: AC
  Administered 2020-01-30: 10 mL via INTRAVENOUS
  Filled 2020-01-30: qty 10

## 2020-02-06 ENCOUNTER — Encounter: Payer: Self-pay | Admitting: Obstetrics and Gynecology

## 2020-02-06 ENCOUNTER — Ambulatory Visit (INDEPENDENT_AMBULATORY_CARE_PROVIDER_SITE_OTHER): Payer: Medicare Other | Admitting: Obstetrics and Gynecology

## 2020-02-06 VITALS — BP 87/52 | HR 99 | Ht 64.0 in | Wt 183.4 lb

## 2020-02-06 DIAGNOSIS — N3941 Urge incontinence: Secondary | ICD-10-CM

## 2020-02-06 DIAGNOSIS — F419 Anxiety disorder, unspecified: Secondary | ICD-10-CM

## 2020-02-06 DIAGNOSIS — N814 Uterovaginal prolapse, unspecified: Secondary | ICD-10-CM | POA: Diagnosis not present

## 2020-02-06 LAB — POCT URINALYSIS DIPSTICK
Bilirubin, UA: NEGATIVE
Glucose, UA: NEGATIVE
Ketones, UA: NEGATIVE
Nitrite, UA: NEGATIVE
Protein, UA: POSITIVE — AB
Spec Grav, UA: 1.02 (ref 1.010–1.025)
Urobilinogen, UA: 0.2 E.U./dL
pH, UA: 6.5 (ref 5.0–8.0)

## 2020-02-06 NOTE — Patient Instructions (Signed)
Hysterectomy Information  A hysterectomy is a surgery in which the uterus is removed. The fallopian tubes and ovaries may be removed (bilateral salpingo-oophorectomy) as well. This procedure may be done to treat various medical problems. After the procedure, a woman will no longer have menstrual periods nor will she be able to become pregnant (sterile). What are the reasons for a hysterectomy? There are many reasons why a woman might have this procedure. They include:  Persistent, abnormal vaginal bleeding.  Long-term (chronic) pelvic pain or infection.  Endometriosis. This is when the lining of the uterus (endometrium) starts to grow outside the uterus.  Adenomyosis. This is when the endometrium starts to grow in the muscle of the uterus.  Pelvic organ prolapse. This is a condition in which the uterus falls down into the vagina.  Noncancerous growths in the uterus (uterine fibroids) that cause symptoms.  The presence of precancerous cells.  Cervical or uterine cancer. What are the different types of hysterectomy? There are three different types of hysterectomy:  Supracervical hysterectomy. In this type, the top part of the uterus is removed, but not the cervix.  Total hysterectomy. In this type, the uterus and cervix are removed.  Radical hysterectomy. In this type, the uterus, the cervix, and the tissue that holds the uterus in place (parametrium) are removed. What are the different ways a hysterectomy can be performed? There are many different ways a hysterectomy can be performed, including:  Abdominal hysterectomy. In this type, an incision is made in the abdomen. The uterus is removed through this incision.  Vaginal hysterectomy. In this type, an incision is made in the vagina. The uterus is removed through this incision. There are no abdominal incisions.  Conventional laparoscopic hysterectomy. In this type, three or four small incisions are made in the abdomen. A thin,  lighted tube with a camera (laparoscope) is inserted into one of the incisions. Other tools are put through the other incisions. The uterus is cut into small pieces. The small pieces are removed through the incisions or through the vagina.  Laparoscopically assisted vaginal hysterectomy (LAVH). In this type, three or four small incisions are made in the abdomen. Part of the surgery is performed laparoscopically and the other part is done vaginally. The uterus is removed through the vagina.  Robot-assisted laparoscopic hysterectomy. In this type, a laparoscope and other tools are inserted into three or four small incisions in the abdomen. A computer-controlled device is used to give the surgeon a 3D image and to help control the surgical instruments. This allows for more precise movements of surgical instruments. The uterus is cut into small pieces and removed through the incisions or removed through the vagina. Discuss the options with your health care provider to determine which type is the right one for you. What are the risks? Generally, this is a safe procedure. However, problems may occur, including:  Bleeding and risk of blood transfusion. Tell your health care provider if you do not want to receive any blood products.  Blood clots in the legs or lung.  Infection.  Damage to other structures or organs.  Allergic reactions to medicines.  Changing to an abdominal hysterectomy from one of the other techniques. What to expect after a hysterectomy  You will be given pain medicine.  You may need to stay in the hospital for 1- 2 days to recover, depending on the type of hysterectomy you had.  Follow your health care provider's instructions about exercise, driving, and general activities. Ask your   health care provider what activities are safe for you.  You will need to have someone with you for the first 3-5 days after you go home.  You will need to follow up with your surgeon in 2-4  weeks after surgery to evaluate your progress.  If the ovaries are removed, you will have early menopause symptoms such as hot flashes, night sweats, and insomnia.  If you had a hysterectomy for a problem that was not cancer or not a condition that could lead to cancer, then you no longer need Pap tests. However, even if you no longer need a Pap test, a regular pelvic exam is a good idea to make sure no other problems are developing. Questions to ask your health care provider  Is a hysterectomy medically necessary? Do I have other treatment options for my condition?  What are my options for hysterectomy procedure?  What organs and tissues need to be removed?  What are the risks?  What are the benefits?  How long will I need to stay in the hospital after the procedure?  How long will I need to recover at home?  What symptoms can I expect after the procedure? Summary  A hysterectomy is a surgery in which the uterus is removed. The fallopian tubes and ovaries may be removed (bilateral salpingo-oophorectomy) as well.  This procedure may be done to treat various medical problems. After the procedure, a woman will no longer have menstrual periods nor will she be able to become pregnant.  Discuss the options with your health care provider to determine which type of hysterectomy is the right one for you. This information is not intended to replace advice given to you by your health care provider. Make sure you discuss any questions you have with your health care provider. Document Revised: 05/27/2017 Document Reviewed: 07/21/2016 Elsevier Patient Education  2020 Elsevier Inc.  

## 2020-02-06 NOTE — Progress Notes (Signed)
utiPt present to discuss surgery. Pt stated that she was doing well other than being nervous and worrying all the time about her family and kids. GAD-7=19.

## 2020-02-06 NOTE — Progress Notes (Signed)
GYNECOLOGY PROGRESS NOTE  Subjective:    Patient ID: Erin [name], female    DOB: 10-Jul-1945, 74 y.o.   MRN: [id number]  HPI  Patient is a 74 y.o. [id number] female who presents for discussion of surgical management of her pelvic organ prolapse. She has uterine prolapse (Grade 3), with small cystocele and rectocele.  She is also experiencing some urge incontinence. Desires urine to be checked today. She states that she has reviewed her options (pessary vs surgical management with hysterectomy and possible A/P repair) and would like to discuss surgery.   Additionally, patient expresses that she is worrying more, mostly about her family (usually her kids) during the pandemic. GAD-7 score today is 19. She also notes a recent fall (ground level),   The following portions of the patient's history were reviewed and updated as appropriate: allergies, current medications, past family history, past medical history, past social history, past surgical history and problem list.  Review of Systems Pertinent items noted in HPI and remainder of comprehensive ROS otherwise negative.   Objective:   Blood pressure (!) 87/52, pulse 99, height 5\' 4"  (1.626 m), weight 183 lb 6.4 oz (83.2 kg). General appearance: alert and no distress Remainder of exam deferred.  Refer to previous exam on 01/16/20.     Labs:  Results for orders placed or performed in visit on 02/06/20  POCT urinalysis dipstick  Result Value Ref Range   Color, UA yellow    Clarity, UA clear    Glucose, UA Negative Negative   Bilirubin, UA neg    Ketones, UA neg    Spec Grav, UA 1.020 1.010 - 1.025   Blood, UA small    pH, UA 6.5 5.0 - 8.0   Protein, UA Positive (A) Negative   Urobilinogen, UA 0.2 0.2 or 1.0 E.U./dL   Nitrite, UA neg    Leukocytes, UA Moderate (2+) (A) Negative   Appearance yellow;clear    Odor       Assessment:   1. 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   2. Urge incontinence of urine      Plan:   1. Patient desires surgical management with hysterectomy (LAVH/BSO) with possible anterior/posterior repair.  The risks of surgery were discussed in detail with the patient including but not limited to: bleeding which may require transfusion or reoperation; infection which may require prolonged hospitalization or re-hospitalization and antibiotic therapy; injury to bowel, bladder, ureters and major vessels or other surrounding organs; formation of adhesions; need for additional procedures including laparotomy; thromboembolic phenomenon; incisional problems and other postoperative or anesthesia complications.  Patient was told that the likelihood that her condition and symptoms will be treated effectively with this surgical management was very high; the postoperative expectations were also discussed in detail. The patient also understands the alternative treatment options which were discussed in full. All questions were answered.  She was told that she will be contacted by our surgical scheduler regarding the time and date of her surgery; routine preoperative instructions will be given to her by the preoperative nursing team.   She is aware of need for preoperative COVID testing and subsequent quarantine from time of test to time of surgery; she will be given further preoperative instructions at that [place] screening visit.  Printed patient education handouts about the procedure were given to the patient to review at home.   Tentative surgery dates include 8/30 or 9/13. If scheduled in September, will need pre-op appointment.  2. Urge incontinence -  symptoms may or may not improve post-surgery. Can consider use of OAB medications. UA with some protein and leukocytes (+2) today. Will send urine culture.  3. Anxiety - patient notes worrying about her family. Discussed methods of stress management.     A total of 15 minutes were spent face-to-face with the patient during this encounter and over  half of that time dealt with counseling and coordination of care.  Rubie Maid, MD [facility]

## 2020-02-07 ENCOUNTER — Telehealth: Payer: Self-pay | Admitting: Obstetrics and Gynecology

## 2020-02-07 NOTE — Telephone Encounter (Signed)
Pt called in and stated that she was seen this week and that she needs to make an appt to see Dr.Cherry. I told the pt I see where you need to come in for a pre-op once surgical date determined. I ask [place] she said her surgery is 03/10/2020 as long as its within 30 days. The pt stated that she has to get a ride to this appt.  Pt made the pre-op appt.

## 2020-02-20 ENCOUNTER — Telehealth: Payer: Self-pay | Admitting: Obstetrics and Gynecology

## 2020-02-20 NOTE — Telephone Encounter (Signed)
Left VM for patient to return call and discuss the possibility of moving her surgery date.Will call again tomorrow .

## 2020-02-22 ENCOUNTER — Telehealth: Payer: Self-pay | Admitting: Obstetrics and Gynecology

## 2020-02-22 NOTE — Telephone Encounter (Signed)
I have spoken to pt made her aware of our current situation with a potential closure to our OR. I explained to patient Dr. Marcelline Mates would try her best and fit her in although it required authorization from her insurance and OR staff. Patient understood the following and has decided to proceed with an outpatient procedure. PA has been processed pending approval . OR board will have more updates towards the end of the month.

## 2020-02-26 ENCOUNTER — Encounter: Payer: Medicare Other | Admitting: Obstetrics and Gynecology

## 2020-03-05 ENCOUNTER — Encounter: Payer: Medicare Other | Admitting: Obstetrics and Gynecology

## 2020-03-06 ENCOUNTER — Other Ambulatory Visit
Admission: RE | Admit: 2020-03-06 | Discharge: 2020-03-06 | Disposition: A | Discharge status: Home / Self Care | Payer: Medicare Other | Source: Ambulatory Visit | Attending: Obstetrics and Gynecology | Admitting: Obstetrics and Gynecology

## 2020-03-06 ENCOUNTER — Other Ambulatory Visit: Payer: Self-pay

## 2020-03-06 ENCOUNTER — Encounter: Payer: Self-pay | Admitting: Obstetrics and Gynecology

## 2020-03-06 ENCOUNTER — Encounter
Admission: RE | Admit: 2020-03-06 | Discharge: 2020-03-06 | Disposition: A | Discharge status: Home / Self Care | Payer: Medicare Other | Source: Ambulatory Visit | Attending: Obstetrics and Gynecology | Admitting: Obstetrics and Gynecology

## 2020-03-06 DIAGNOSIS — Z01818 Encounter for other preprocedural examination: Secondary | ICD-10-CM | POA: Insufficient documentation

## 2020-03-06 DIAGNOSIS — I10 Essential (primary) hypertension: Secondary | ICD-10-CM | POA: Insufficient documentation

## 2020-03-06 HISTORY — DX: Personal history of other diseases of the digestive system: Z87.19

## 2020-03-06 HISTORY — DX: Gastro-esophageal reflux disease without esophagitis: K21.9

## 2020-03-06 NOTE — Patient Instructions (Addendum)
Your procedure is scheduled on: 03-17-20 MONDAY Report to Same Day Surgery 2nd floor medical mall [facility] Entrance-take elevator on left to 2nd floor.  Check in with surgery information desk.) To find out your arrival time please call [phone] between 1PM - 3PM on 03-14-20 FRIDAY  Remember: Instructions that are not followed completely may result in serious medical risk, up to and including death, or upon the discretion of your surgeon and anesthesiologist your surgery may need to be rescheduled.    _x___ 1. Do not eat food after midnight the night before your procedure. NO GUM OR CANDY AFTER MIDNIGHT. You may drink clear liquids up to 2 hours before you are scheduled to arrive at the hospital for your procedure.  Do not drink clear liquids within 2 hours of your scheduled arrival to the hospital.  Clear liquids include  --Water or Apple juice without pulp  --Gatorade  --Black Coffee or Clear Tea (No milk, no creamers, do not add anything to the coffee or Tea    __x__ 2. No Alcohol for 24 hours before or after surgery.   __x__3. No Smoking or e-cigarettes for 24 prior to surgery.  Do not use any chewable tobacco products for at least 6 hour prior to surgery   ____  4. Bring all medications with you on the day of surgery if instructed.    __x__ 5. Notify your doctor if there is any change in your medical condition     (cold, fever, infections).    x___6. On the morning of surgery brush your teeth with toothpaste and water.  You may rinse your mouth with mouth wash if you wish.  Do not swallow any toothpaste or mouthwash.   Do not wear jewelry, make-up, hairpins, clips or nail polish.  Do not wear lotions, powders, or perfumes.   Do not shave 48 hours prior to surgery. Men may shave face and neck.  Do not bring valuables to the hospital.    [facility] is not responsible for any belongings or valuables.               Contacts, dentures or bridgework may not be worn into  surgery.  Leave your suitcase in the car. After surgery it may be brought to your room.  For patients admitted to the hospital, discharge time is determined by your treatment team.  _  Patients discharged the day of surgery will not be allowed to drive home.  You will need someone to drive you home and stay with you the night of your procedure.    Please read over the following fact sheets that you were given:   [facility] Preparing for Surgery  _x___ TAKE THE FOLLOWING MEDICATION THE MORNING OF SURGERY WITH A SMALL SIP OF WATER. These include:  1. CELEXA (CITALOPRAM)  2. BARACLUDE (ENTECAVIR)  3. GABAPENTIN (NEURONTIN)  4. PRILOSEC (OMEPRAZOLE)  5.  6.  ____Fleets enema or Magnesium Citrate as directed.   _x___ Use CHG Soap as directed on instruction sheet   _X___ Use inhalers on the day of surgery and bring to hospital day of surgery-USE YOUR DULERA AND ALBUTEROL INHALER THE MORNING OF SURGERY AND ALBUTEROL [place]  ____ Stop Metformin and Janumet 2 days prior to surgery.    ____ Take 1/2 of usual insulin dose the night before surgery and none on the morning surgery.   _x___ Follow recommendations from Cardiologist, Pulmonologist or PCP regarding stopping Aspirin, Coumadin, Plavix ,Eliquis, Effient,  or Pradaxa, and Pletal-DR CHERRY'S OFFICE TO CALL PATIENT AND LET HER KNOW WHEN TO STOP ASPIRIN AND COUMADIN (WARFARIN)  X____Stop Anti-inflammatories such as Advil, Aleve, Ibuprofen, Motrin, Naproxen, Naprosyn, Goodies powders or aspirin products NOW-OK to take Tylenol   _x___ Stop supplements until after surgery-STOP BIOTIN NOW-YOU MAY RESUME AFTER YOUR SURGERY   ____ Bring C-Pap to the hospital.

## 2020-03-06 NOTE — Progress Notes (Signed)
Pt present today for pre-op exam. Pt stated that she was really nerves about her surgery but was doing well other than that.

## 2020-03-06 NOTE — Pre-Procedure Instructions (Addendum)
CALLED OVER TO DR CHERRY'S OFFICE REGARDING THIS PT. SPOKE WITH RECEPTIONIST DUE TO DR CHERRYS NURSE BEING IN A ROOM. THIS PT  IS TAKING COUMADIN AND ASPIRIN AND NO ONE HAS ADDRESSED WHEN THESE MEDS NEED TO BE STOPPED. RECEPTIONIST WILL RELAY INFO TO NURSE AND SOMEONE FROM THEIR OFFICE WILL CALL PT AND LET HER KNOW. ALSO PT LIVES AT [facility] (SENIOR LIVING) AND SHE HAS NO ONE TO STAY WITH HER FOR THE FIRST 24 HOURS AFTER SURGERY. DR CHERRY WILL BE NOTIFIED OF THIS PER RECEPTIONIST

## 2020-03-07 ENCOUNTER — Inpatient Hospital Stay: Admission: RE | Admit: 2020-03-07 | Payer: Medicare Other | Source: Ambulatory Visit

## 2020-03-07 ENCOUNTER — Ambulatory Visit (INDEPENDENT_AMBULATORY_CARE_PROVIDER_SITE_OTHER): Payer: Medicare Other | Admitting: Obstetrics and Gynecology

## 2020-03-07 ENCOUNTER — Encounter: Payer: Medicare Other | Admitting: Obstetrics and Gynecology

## 2020-03-07 ENCOUNTER — Encounter: Payer: Self-pay | Admitting: Obstetrics and Gynecology

## 2020-03-07 VITALS — BP 105/73 | HR 93 | Ht 64.0 in | Wt 184.4 lb

## 2020-03-07 DIAGNOSIS — Z01818 Encounter for other preprocedural examination: Secondary | ICD-10-CM | POA: Diagnosis not present

## 2020-03-07 DIAGNOSIS — N3946 Mixed incontinence: Secondary | ICD-10-CM

## 2020-03-07 DIAGNOSIS — N814 Uterovaginal prolapse, unspecified: Secondary | ICD-10-CM | POA: Diagnosis not present

## 2020-03-07 DIAGNOSIS — N819 Female genital prolapse, unspecified: Secondary | ICD-10-CM

## 2020-03-07 DIAGNOSIS — Z7901 Long term (current) use of anticoagulants: Secondary | ICD-10-CM

## 2020-03-07 NOTE — Patient Instructions (Signed)
You are scheduled for surgery on Monday 03/17/2020 (changed from 03/10/2020).  Nothing to eat after midnight on day prior to surgery.  STOP your Coumadin on Wednesday 03/12/2020.  Do not take any medications unless recommended by your provider on day prior to surgery.  Do not take NSAIDs (Motrin, Aleve) or aspirin 7 days prior to surgery.  You may take Tylenol products for minor aches and pains.  You will receive a prescription for pain medications post-operatively.  You will be contacted by phone approximately 1 week prior to surgery to schedule pre-operative appointment.  Please call the office if you have any questions regarding your upcoming surgery.

## 2020-03-07 NOTE — Progress Notes (Addendum)
  [facility] Perioperative Services: Pre-Admission/Anesthesia Testing     Date: 03/07/20  Name: Erin [name] MRN:   [id number]  Re: Surgery plans  Patient scheduled for LAPAROSCOPIC ASSISTED VAGINAL HYSTERECTOMY WITH SALPINGO OOPHORECTOMY (Bilateral ) ANTERIOR (CYSTOCELE) AND POSTERIOR REPAIR (RECTOCELE) on 03/10/2020. She was scheduled to come into the PAT clinic today for T&S and SARS-CoV-2 (novel coronavirus) testing. Appointment was as 1200 PM. As of 1345 patient had not show up. I contacted her provider's office and spoke with Luana Shu, LPN who advised me that patient would not be having the planned procedure on 03/10/2020. When asked for a cancellation reason, I was advised that she was being cancelled due to "transportation issues". Communicated with [facility] charge nurse Barbette Merino, RN) to make them aware so that patient could be removed from the OR schedule.   ADDENDUM: 03/07/2020 at 1410 - patient moved from 03/10/2020 schedule to 03/17/2020. Will make PAT staff aware so that pre-surgical lab testing can be scheduled to prevent any day of surgery delays.   Honor Loh, MSN, APRN, FNP-C, CEN [facility]  Peri-operative Services Nurse Practitioner Phone: [phone] 03/07/20 1:51 PM

## 2020-03-07 NOTE — Progress Notes (Signed)
GYNECOLOGY PROGRESS NOTE  Subjective:    Patient ID: Erin [name], female    DOB: 11/23/1945, 74 y.o.   MRN: [id number]  HPI  Patient is a 74 y.o. [id number] female who presents for pre-operative examination.  She has a history of cystocele with rectocele and uterine prolapse (Grade 3).  She additionally notes complaints of urinary incontinence (initially only reported urge component, however during 2 days visit she also endorses stress component with occasional leakage with coughing or sneezing ).  Her risk factors for surgery include chronic use of anticoagulant therapy, and chronic kidney disease.  The following portions of the patient's history were reviewed and updated as appropriate: allergies, current medications, past family history, past medical history, past social history, past surgical history and problem list.  Review of Systems Pertinent items noted in HPI and remainder of comprehensive ROS otherwise negative.   Objective:   Blood pressure 105/73, pulse 93, height 5\' 4"  (1.626 m), weight 184 lb 6.4 oz (83.6 kg). General appearance: alert and no distress Abdomen: soft, non-tender; bowel sounds normal; no masses,  no organomegaly Pelvic:     -  VULVA: normal appearing vulva with no masses, tenderness or lesions   -  VAGINA: atrophic, no discharge or lesions. Cystocele (small) present.    -  CERVIX: normal appearing cervix without discharge or lesions, noted ~ 1-2 cm above introitus, descends beyond the introitus on Valsalva.    - UTERUS: uterus is normal size, shape, consistency and nontender   -  ADNEXA: normal adnexa in size, nontender and no masses   -  RECTAL: rectocele noted (small). Extremities: extremities normal, atraumatic, no cyanosis or edema Neurologic: Grossly normal   Assessment:   1. Visit for pre-operative examination   2. 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   3. 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 Chronic  anticoagulation    Plan:   .1. Patient desires surgical management with hysterectomy (LAVH/BSO) with anterior/posterior repair. Also will perform urethral sling (TOT).  The risks of surgery were discussed in detail with the patient including but not limited to: bleeding which may require transfusion or reoperation; infection which may require prolonged hospitalization or re-hospitalization and antibiotic therapy; injury to bowel, bladder, ureters and major vessels or other surrounding organs; formation of adhesions;need for additional procedures including laparotomy; thromboembolic phenomenon; incisional problems and other postoperative or anesthesia complications.  Patient was told that the likelihood that her condition and symptoms will be treated effectively with this surgical management was very high; the postoperative expectations were also discussed in detail. The patient also understands the alternative treatment options which were discussed in full. All questions were answered.  Patient was initially scheduled for surgery on 03/10/2020, however after discussion of post-operative care Expectations, patient reports that she does not have anyone who will be with her at home after her surgery, and she is planning on having procedure as outpatient. I discuss my concerns with patient, as she is elderly and will need some sort of assistance at home immediately after surgery.  Patient states that she does not have anyone as her family does not really take up with her. She does have a few church friends and neigbors but are also elderly and would have a difficult time caring for her as they have significant medical issues themselves.  Will attempt to see if overnight recovery is possible in lieu of recent OR changes due to COVID-19 and elective procedures.  Patient  states that she just cannot go on like this.  Still declines use of the pessary, and does not desire to wait an additional 1-2 months.  of here I'm a  patient noted routine preoperative instructions will be given to her by the preoperative nursing team.   She is aware of need for preoperative COVID testing and subsequent quarantine from time of test to time of surgery; she will be given further preoperative instructions at that [place] screening visit.  Also needed to arrange transportation through a service as she has no one to drive her to and from surgery. Discussed with medical transportation service [employer]), who agree to bring patient to hospital on day of surgery, and will transport her home.  Will move surgery date 1 week to better help to arrange for accommodations for patient. New surgery date is 03/17/2020.  2. 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 Review of literature and management guidelines for Coumadin therapy and surgery report need for holding coumadin for 5 days prior to surgery. Does not require bridging with LMWH as patient has not had a recent thrombotic event or other high risk condition.    A total of 25 minutes were spent face-to-face with the patient during this encounter and over half of that time involved counseling and coordination of care.    Rubie Maid, MD [facility]

## 2020-03-09 ENCOUNTER — Encounter: Payer: Self-pay | Admitting: Obstetrics and Gynecology

## 2020-03-09 NOTE — H&P (View-Only) (Signed)
GYNECOLOGY PREOPERATIVE HISTORY AND PHYSICAL  Subjective:  Erin [name] is a 74 y.o. [id number] here for surgical management of cystocele with rectocele, incomplete uterine prolapse (Grade 3), and mixed urinary incontinence.   Significant preoperative concerns: patient on chronic anticoagulation therapy. Held Coumadin for 5 days pre-operatively.   Proposed surgery: LAVH with BSO, anterior/posterior repair, and TOT sling.   Pertinent Gynecological History: Menses: post-menopausal Last mammogram: normal Date: 08/03/2019 Last pap: no longer needed. No prior history of abnormal pap smears.     Past Medical History:  Diagnosis Date  . Acute URI   . Allergy   . Anxiety   . Asthma    well controlled  . Cancer ([place])    lymphoma in remission  . Diverticulosis   . DVT of upper extremity (deep vein thrombosis) ([place]) 07/2008 & 12/2007  . Gastric ulcer 10/2007   EGD  . GERD (gastroesophageal reflux disease)   . Hepatitis B    treated  . History of hiatal hernia   . Hypertension   . Laboratory confirmed diagnosis of COVID-19 06/27/2019  . Lower extremity edema   . Lymphoma ([place])    Stage 3 large B cell lymphoma   Past Surgical History:  Procedure Laterality Date  . CHOLECYSTECTOMY    . COLONOSCOPY  11/23/2007, 05/12/1994  . COLONOSCOPY WITH PROPOFOL N/A 03/31/2018   Procedure: COLONOSCOPY WITH PROPOFOL;  Surgeon: Manya Silvas, MD;  Location: [facility] ENDOSCOPY;  Service: Endoscopy;  Laterality: N/A;  . ESOPHAGOGASTRODUODENOSCOPY  09/03/2009, 10/14/2008, 09/30/2008, 11/23/2007  . HEMORRHOID SURGERY    . LIVER BIOPSY  01/27/1996  . PORTOCAVAL SHUNT PLACEMENT     and removal    OB History  Gravida Para Term Preterm AB Living  7 6 6   1 6   SAB TAB Ectopic Multiple Live Births  1       6    # Outcome Date GA Lbr Len/2nd Weight Sex Delivery Anes PTL Lv  7 SAB 1997          6 Term           5 Term           4 Term           3 Term           2 Term           1 Term              Family History  Problem Relation Age of Onset  . Cancer Daughter   . Asthma Mother   . Breast cancer Neg Hx     Social History   Socioeconomic History  . Marital status: Divorced    Spouse name: Not on file  . Number of children: Not on file  . Years of education: Not on file  . Highest education level: Not on file  Occupational History  . Not on file  Tobacco Use  . Smoking status: Never Smoker  . Smokeless tobacco: Never Used  Vaping Use  . Vaping Use: Never used  Substance and Sexual Activity  . Alcohol use: No  . Drug use: Never  . Sexual activity: Not Currently  Other Topics Concern  . Not on file  Social History Narrative   Independent at baseline, ambulates without any support   Lives by herself   Social Determinants of Health   Financial Resource Strain:   . Difficulty of Paying Living Expenses: Not on file  Food Insecurity:   .  Worried About Charity fundraiser in the Last Year: Not on file  . Ran Out of Food in the Last Year: Not on file  Transportation Needs:   . Lack of Transportation (Medical): Not on file  . Lack of Transportation (Non-Medical): Not on file  Physical Activity:   . Days of Exercise per Week: Not on file  . Minutes of Exercise per Session: Not on file  Stress:   . Feeling of Stress : Not on file  Social Connections:   . Frequency of Communication with Friends and Family: Not on file  . Frequency of Social Gatherings with Friends and Family: Not on file  . Attends Religious Services: Not on file  . Active Member of Clubs or Organizations: Not on file  . Attends Archivist Meetings: Not on file  . Marital Status: Not on file  Intimate Partner Violence:   . Fear of Current or Ex-Partner: Not on file  . Emotionally Abused: Not on file  . Physically Abused: Not on file  . Sexually Abused: Not on file    Current Outpatient Medications on File Prior to Visit  Medication Sig Dispense Refill  . acetaminophen (TYLENOL)  325 MG tablet Take 325-650 mg by mouth every 6 (six) hours as needed for moderate pain or headache.    . albuterol (PROVENTIL HFA;VENTOLIN HFA) 108 (90 Base) MCG/ACT inhaler Inhale 1-2 puffs into the lungs every 6 (six) hours as needed for wheezing or shortness of breath. (Patient taking differently: Inhale 2 puffs into the lungs every 4 (four) hours as needed for wheezing or shortness of breath. ) 1 Inhaler 0  . albuterol (PROVENTIL) (2.5 MG/3ML) 0.083% nebulizer solution Take 2.5 mg by nebulization every 6 (six) hours as needed for wheezing or shortness of breath.    Marland Kitchen alendronate (FOSAMAX) 70 MG tablet Take 70 mg by mouth every Friday.     Marland Kitchen aspirin 81 MG chewable tablet Chew 81 mg by mouth daily.    . Biotin (BIOTIN 5000) 5 MG CAPS Take 5 mg by mouth daily.    . Calcium Carb-Cholecalciferol (CALCIUM 600 + D PO) Take 1 tablet by mouth daily.    . Carboxymethylcellulose Sodium (LUBRICANT EYE DROPS OP) Place 1 drop into both eyes daily as needed (dry eyes).    . citalopram (CELEXA) 40 MG tablet Take 40 mg by mouth every morning.     . Cyanocobalamin (B-12) 2500 MCG TABS Take 2,500 mcg by mouth daily.    . diphenhydrAMINE (BENADRYL) 25 MG tablet Take 25 mg by mouth at bedtime.     Marland Kitchen entecavir (BARACLUDE) 0.5 MG tablet Take 0.5 mg by mouth every morning.     . ferrous gluconate (IRON 27) 240 (27 FE) MG tablet Take 240 mg by mouth daily.    . furosemide (LASIX) 20 MG tablet Take 20 mg by mouth daily.    Marland Kitchen gabapentin (NEURONTIN) 600 MG tablet Take 600 mg by mouth 3 (three) times daily.     . mometasone-formoterol (DULERA) 200-5 MCG/ACT AERO Inhale 2 puffs into the lungs 2 (two) times daily. 1 Inhaler 1  . Multiple Vitamin (MULTIVITAMIN WITH MINERALS) TABS tablet Take 1 tablet by mouth daily.    Marland Kitchen omeprazole (PRILOSEC) 20 MG capsule Take 20 mg by mouth 2 (two) times daily.   2  . tiZANidine (ZANAFLEX) 2 MG tablet Take 2 mg by mouth 3 (three) times daily as needed for muscle spasms.     Marland Kitchen warfarin  (COUMADIN) 1  MG tablet TAKE ONE TABLET BY MOUTH EVERY DAY AT 6PM (Patient taking differently: Take 1 mg by mouth daily. ) 90 tablet 0   Current Facility-Administered Medications on File Prior to Visit  Medication Dose Route Frequency Provider Last Rate Last Admin  . sodium chloride 0.9 % injection 10 mL  10 mL Intravenous PRN Forest Gleason, MD   10 mL at 12/18/14 1121   Allergies  Allergen Reactions  . Penicillins Rash    Has patient had a PCN reaction causing immediate rash, facial/tongue/throat swelling, SOB or lightheadedness with hypotension: Yes Has patient had a PCN reaction causing severe rash involving mucus membranes or skin necrosis: No Has patient had a PCN reaction that required hospitalization: No Has patient had a PCN reaction occurring within the last 10 years: No If all of the above answers are "NO", then may proceed with Cephalosporin use.  . Prednisone Hives  . Sulfa Antibiotics Hives and Shortness Of Breath  . Enalapril     Hypotension       Review of Systems Constitutional: No recent fever/chills/sweats Respiratory: No recent cough/bronchitis Cardiovascular: No chest pain Gastrointestinal: No recent nausea/vomiting/diarrhea Genitourinary: No UTI symptoms Hematologic/lymphatic:No history of coagulopathy or recent blood thinner use    Objective:   Blood pressure 105/73, pulse 93, height 5\' 4"  (1.626 m), weight 184 lb 6.4 oz (83.6 kg). CONSTITUTIONAL: Well-developed, well-nourished female in no acute distress.  HENT:  Normocephalic, atraumatic, External right and left ear normal. Oropharynx is clear and moist EYES: Conjunctivae and EOM are normal. Pupils are equal, round, and reactive to light. No scleral icterus.  NECK: Normal range of motion, supple, no masses SKIN: Skin is warm and dry. No rash noted. Not diaphoretic. No erythema. No pallor. NEUROLOGIC: Alert and oriented to person, place, and time. Normal reflexes, muscle tone coordination. No cranial nerve  deficit noted. PSYCHIATRIC: Normal mood and affect. Normal behavior. Normal judgment and thought content. CARDIOVASCULAR: Normal heart rate noted, regular rhythm RESPIRATORY: Effort and breath sounds normal, no problems with respiration noted ABDOMEN: Soft, nontender, nondistended. PELVIC: Deferred MUSCULOSKELETAL: Normal range of motion. No edema and no tenderness. 2+ distal pulses.    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 Likelihood of success in alleviating the patient's condition was discussed. Routine postoperative instructions will be reviewed with the patient and her family in detail after surgery.  The patient concurred with the proposed plan, giving informed written consent for the surgery.   Preop testing ordered. Instructions reviewed, including NPO after midnight.  Rubie Maid, MD [facility]

## 2020-03-09 NOTE — H&P (Signed)
GYNECOLOGY PREOPERATIVE HISTORY AND PHYSICAL  Subjective:  Erin [name] is a 74 y.o. [id number] here for surgical management of cystocele with rectocele, incomplete uterine prolapse (Grade 3), and mixed urinary incontinence.   Significant preoperative concerns: patient on chronic anticoagulation therapy. Held Coumadin for 5 days pre-operatively.   Proposed surgery: LAVH with BSO, anterior/posterior repair, and TOT sling.   Pertinent Gynecological History: Menses: post-menopausal Last mammogram: normal Date: 08/03/2019 Last pap: no longer needed. No prior history of abnormal pap smears.     Past Medical History:  Diagnosis Date  . Acute URI   . Allergy   . Anxiety   . Asthma    well controlled  . Cancer ([place])    lymphoma in remission  . Diverticulosis   . DVT of upper extremity (deep vein thrombosis) ([place]) 07/2008 & 12/2007  . Gastric ulcer 10/2007   EGD  . GERD (gastroesophageal reflux disease)   . Hepatitis B    treated  . History of hiatal hernia   . Hypertension   . Laboratory confirmed diagnosis of COVID-19 06/27/2019  . Lower extremity edema   . Lymphoma ([place])    Stage 3 large B cell lymphoma   Past Surgical History:  Procedure Laterality Date  . CHOLECYSTECTOMY    . COLONOSCOPY  11/23/2007, 05/12/1994  . COLONOSCOPY WITH PROPOFOL N/A 03/31/2018   Procedure: COLONOSCOPY WITH PROPOFOL;  Surgeon: Manya Silvas, MD;  Location: [facility] ENDOSCOPY;  Service: Endoscopy;  Laterality: N/A;  . ESOPHAGOGASTRODUODENOSCOPY  09/03/2009, 10/14/2008, 09/30/2008, 11/23/2007  . HEMORRHOID SURGERY    . LIVER BIOPSY  01/27/1996  . PORTOCAVAL SHUNT PLACEMENT     and removal    OB History  Gravida Para Term Preterm AB Living  7 6 6   1 6   SAB TAB Ectopic Multiple Live Births  1       6    # Outcome Date GA Lbr Len/2nd Weight Sex Delivery Anes PTL Lv  7 SAB 1997          6 Term           5 Term           4 Term           3 Term           2 Term           1 Term              Family History  Problem Relation Age of Onset  . Cancer Daughter   . Asthma Mother   . Breast cancer Neg Hx     Social History   Socioeconomic History  . Marital status: Divorced    Spouse name: Not on file  . Number of children: Not on file  . Years of education: Not on file  . Highest education level: Not on file  Occupational History  . Not on file  Tobacco Use  . Smoking status: Never Smoker  . Smokeless tobacco: Never Used  Vaping Use  . Vaping Use: Never used  Substance and Sexual Activity  . Alcohol use: No  . Drug use: Never  . Sexual activity: Not Currently  Other Topics Concern  . Not on file  Social History Narrative   Independent at baseline, ambulates without any support   Lives by herself   Social Determinants of Health   Financial Resource Strain:   . Difficulty of Paying Living Expenses: Not on file  Food Insecurity:   .  Worried About Charity fundraiser in the Last Year: Not on file  . Ran Out of Food in the Last Year: Not on file  Transportation Needs:   . Lack of Transportation (Medical): Not on file  . Lack of Transportation (Non-Medical): Not on file  Physical Activity:   . Days of Exercise per Week: Not on file  . Minutes of Exercise per Session: Not on file  Stress:   . Feeling of Stress : Not on file  Social Connections:   . Frequency of Communication with Friends and Family: Not on file  . Frequency of Social Gatherings with Friends and Family: Not on file  . Attends Religious Services: Not on file  . Active Member of Clubs or Organizations: Not on file  . Attends Archivist Meetings: Not on file  . Marital Status: Not on file  Intimate Partner Violence:   . Fear of Current or Ex-Partner: Not on file  . Emotionally Abused: Not on file  . Physically Abused: Not on file  . Sexually Abused: Not on file    Current Outpatient Medications on File Prior to Visit  Medication Sig Dispense Refill  . acetaminophen (TYLENOL)  325 MG tablet Take 325-650 mg by mouth every 6 (six) hours as needed for moderate pain or headache.    . albuterol (PROVENTIL HFA;VENTOLIN HFA) 108 (90 Base) MCG/ACT inhaler Inhale 1-2 puffs into the lungs every 6 (six) hours as needed for wheezing or shortness of breath. (Patient taking differently: Inhale 2 puffs into the lungs every 4 (four) hours as needed for wheezing or shortness of breath. ) 1 Inhaler 0  . albuterol (PROVENTIL) (2.5 MG/3ML) 0.083% nebulizer solution Take 2.5 mg by nebulization every 6 (six) hours as needed for wheezing or shortness of breath.    Marland Kitchen alendronate (FOSAMAX) 70 MG tablet Take 70 mg by mouth every Friday.     Marland Kitchen aspirin 81 MG chewable tablet Chew 81 mg by mouth daily.    . Biotin (BIOTIN 5000) 5 MG CAPS Take 5 mg by mouth daily.    . Calcium Carb-Cholecalciferol (CALCIUM 600 + D PO) Take 1 tablet by mouth daily.    . Carboxymethylcellulose Sodium (LUBRICANT EYE DROPS OP) Place 1 drop into both eyes daily as needed (dry eyes).    . citalopram (CELEXA) 40 MG tablet Take 40 mg by mouth every morning.     . Cyanocobalamin (B-12) 2500 MCG TABS Take 2,500 mcg by mouth daily.    . diphenhydrAMINE (BENADRYL) 25 MG tablet Take 25 mg by mouth at bedtime.     Marland Kitchen entecavir (BARACLUDE) 0.5 MG tablet Take 0.5 mg by mouth every morning.     . ferrous gluconate (IRON 27) 240 (27 FE) MG tablet Take 240 mg by mouth daily.    . furosemide (LASIX) 20 MG tablet Take 20 mg by mouth daily.    Marland Kitchen gabapentin (NEURONTIN) 600 MG tablet Take 600 mg by mouth 3 (three) times daily.     . mometasone-formoterol (DULERA) 200-5 MCG/ACT AERO Inhale 2 puffs into the lungs 2 (two) times daily. 1 Inhaler 1  . Multiple Vitamin (MULTIVITAMIN WITH MINERALS) TABS tablet Take 1 tablet by mouth daily.    Marland Kitchen omeprazole (PRILOSEC) 20 MG capsule Take 20 mg by mouth 2 (two) times daily.   2  . tiZANidine (ZANAFLEX) 2 MG tablet Take 2 mg by mouth 3 (three) times daily as needed for muscle spasms.     Marland Kitchen warfarin  (COUMADIN) 1  MG tablet TAKE ONE TABLET BY MOUTH EVERY DAY AT 6PM (Patient taking differently: Take 1 mg by mouth daily. ) 90 tablet 0   Current Facility-Administered Medications on File Prior to Visit  Medication Dose Route Frequency Provider Last Rate Last Admin  . sodium chloride 0.9 % injection 10 mL  10 mL Intravenous PRN Forest Gleason, MD   10 mL at 12/18/14 1121   Allergies  Allergen Reactions  . Penicillins Rash    Has patient had a PCN reaction causing immediate rash, facial/tongue/throat swelling, SOB or lightheadedness with hypotension: Yes Has patient had a PCN reaction causing severe rash involving mucus membranes or skin necrosis: No Has patient had a PCN reaction that required hospitalization: No Has patient had a PCN reaction occurring within the last 10 years: No If all of the above answers are "NO", then may proceed with Cephalosporin use.  . Prednisone Hives  . Sulfa Antibiotics Hives and Shortness Of Breath  . Enalapril     Hypotension       Review of Systems Constitutional: No recent fever/chills/sweats Respiratory: No recent cough/bronchitis Cardiovascular: No chest pain Gastrointestinal: No recent nausea/vomiting/diarrhea Genitourinary: No UTI symptoms Hematologic/lymphatic:No history of coagulopathy or recent blood thinner use    Objective:   Blood pressure 105/73, pulse 93, height 5\' 4"  (1.626 m), weight 184 lb 6.4 oz (83.6 kg). CONSTITUTIONAL: Well-developed, well-nourished female in no acute distress.  HENT:  Normocephalic, atraumatic, External right and left ear normal. Oropharynx is clear and moist EYES: Conjunctivae and EOM are normal. Pupils are equal, round, and reactive to light. No scleral icterus.  NECK: Normal range of motion, supple, no masses SKIN: Skin is warm and dry. No rash noted. Not diaphoretic. No erythema. No pallor. NEUROLOGIC: Alert and oriented to person, place, and time. Normal reflexes, muscle tone coordination. No cranial nerve  deficit noted. PSYCHIATRIC: Normal mood and affect. Normal behavior. Normal judgment and thought content. CARDIOVASCULAR: Normal heart rate noted, regular rhythm RESPIRATORY: Effort and breath sounds normal, no problems with respiration noted ABDOMEN: Soft, nontender, nondistended. PELVIC: Deferred MUSCULOSKELETAL: Normal range of motion. No edema and no tenderness. 2+ distal pulses.    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 Likelihood of success in alleviating the patient's condition was discussed. Routine postoperative instructions will be reviewed with the patient and her family in detail after surgery.  The patient concurred with the proposed plan, giving informed written consent for the surgery.   Preop testing ordered. Instructions reviewed, including NPO after midnight.  Rubie Maid, MD [facility]

## 2020-03-12 NOTE — Addendum Note (Signed)
Addended by: Augusto Gamble on: 03/12/2020 11:05 PM   Modules accepted: Orders, SmartSet

## 2020-03-13 ENCOUNTER — Other Ambulatory Visit: Payer: Self-pay

## 2020-03-13 ENCOUNTER — Encounter
Admission: RE | Admit: 2020-03-13 | Discharge: 2020-03-13 | Disposition: A | Discharge status: Home / Self Care | Payer: Medicare Other | Source: Ambulatory Visit | Attending: Obstetrics and Gynecology | Admitting: Obstetrics and Gynecology

## 2020-03-13 ENCOUNTER — Telehealth: Payer: Self-pay | Admitting: Obstetrics and Gynecology

## 2020-03-13 DIAGNOSIS — Z20822 Contact with and (suspected) exposure to covid-19: Secondary | ICD-10-CM | POA: Insufficient documentation

## 2020-03-13 DIAGNOSIS — I10 Essential (primary) hypertension: Secondary | ICD-10-CM | POA: Insufficient documentation

## 2020-03-13 DIAGNOSIS — Z01810 Encounter for preprocedural cardiovascular examination: Secondary | ICD-10-CM | POA: Insufficient documentation

## 2020-03-13 DIAGNOSIS — Z01812 Encounter for preprocedural laboratory examination: Secondary | ICD-10-CM | POA: Insufficient documentation

## 2020-03-13 DIAGNOSIS — I252 Old myocardial infarction: Secondary | ICD-10-CM | POA: Insufficient documentation

## 2020-03-13 DIAGNOSIS — R9431 Abnormal electrocardiogram [ECG] [EKG]: Secondary | ICD-10-CM | POA: Diagnosis not present

## 2020-03-13 NOTE — Telephone Encounter (Signed)
Pt called in and stated that she was seen today at [facility] and the nurse there told her it was okay to not take her blood thinner. The pt is wanting to let the Doctor know. The pt said that she is having surgery Monday. I told the pt I will send a message to the nurse. The pt verbally understood. Please advise

## 2020-03-14 LAB — SARS CORONAVIRUS 2 (TAT 6-24 HRS): SARS Coronavirus 2: NEGATIVE

## 2020-03-14 NOTE — Telephone Encounter (Signed)
Pt called no answer unable to leave message no voicemail picked up.

## 2020-03-17 ENCOUNTER — Ambulatory Visit: Payer: Medicare Other | Admitting: Urgent Care

## 2020-03-17 ENCOUNTER — Encounter: Payer: Self-pay | Admitting: Obstetrics and Gynecology

## 2020-03-17 ENCOUNTER — Other Ambulatory Visit: Payer: Self-pay

## 2020-03-17 ENCOUNTER — Observation Stay
Admission: AD | Admit: 2020-03-17 | Discharge: 2020-03-18 | Disposition: A | Discharge status: Home / Self Care | Payer: Medicare Other | Attending: Obstetrics and Gynecology | Admitting: Obstetrics and Gynecology

## 2020-03-17 ENCOUNTER — Encounter
Admission: AD | Disposition: A | Discharge status: Home / Self Care | Payer: Self-pay | Source: Home / Self Care | Attending: Obstetrics and Gynecology

## 2020-03-17 DIAGNOSIS — N3941 Urge incontinence: Secondary | ICD-10-CM

## 2020-03-17 DIAGNOSIS — J45909 Unspecified asthma, uncomplicated: Secondary | ICD-10-CM | POA: Insufficient documentation

## 2020-03-17 DIAGNOSIS — N1831 Chronic kidney disease, stage 3a: Secondary | ICD-10-CM | POA: Diagnosis not present

## 2020-03-17 DIAGNOSIS — Z8572 Personal history of non-Hodgkin lymphomas: Secondary | ICD-10-CM | POA: Insufficient documentation

## 2020-03-17 DIAGNOSIS — Z79899 Other long term (current) drug therapy: Secondary | ICD-10-CM | POA: Diagnosis not present

## 2020-03-17 DIAGNOSIS — Z7901 Long term (current) use of anticoagulants: Secondary | ICD-10-CM | POA: Insufficient documentation

## 2020-03-17 DIAGNOSIS — I129 Hypertensive chronic kidney disease with stage 1 through stage 4 chronic kidney disease, or unspecified chronic kidney disease: Secondary | ICD-10-CM | POA: Diagnosis not present

## 2020-03-17 DIAGNOSIS — B181 Chronic viral hepatitis B without delta-agent: Secondary | ICD-10-CM

## 2020-03-17 DIAGNOSIS — N8110 Cystocele, unspecified: Secondary | ICD-10-CM | POA: Diagnosis present

## 2020-03-17 DIAGNOSIS — N393 Stress incontinence (female) (male): Secondary | ICD-10-CM | POA: Diagnosis not present

## 2020-03-17 DIAGNOSIS — N812 Incomplete uterovaginal prolapse: Principal | ICD-10-CM | POA: Insufficient documentation

## 2020-03-17 HISTORY — PX: PUBOVAGINAL SLING: SHX1035

## 2020-03-17 HISTORY — PX: LAPAROSCOPIC VAGINAL HYSTERECTOMY WITH SALPINGO OOPHORECTOMY: SHX6681

## 2020-03-17 HISTORY — PX: ANTERIOR AND POSTERIOR REPAIR: SHX5121

## 2020-03-17 LAB — CBC
HCT: 33.8 % — ABNORMAL LOW (ref 36.0–46.0)
Hemoglobin: 11.8 g/dL — ABNORMAL LOW (ref 12.0–15.0)
MCH: 32.6 pg (ref 26.0–34.0)
MCHC: 34.9 g/dL (ref 30.0–36.0)
MCV: 93.4 fL (ref 80.0–100.0)
Platelets: 177 10*3/uL (ref 150–400)
RBC: 3.62 MIL/uL — ABNORMAL LOW (ref 3.87–5.11)
RDW: 14.3 % (ref 11.5–15.5)
WBC: 5.7 10*3/uL (ref 4.0–10.5)
nRBC: 0 % (ref 0.0–0.2)

## 2020-03-17 LAB — COMPREHENSIVE METABOLIC PANEL
ALT: 15 U/L (ref 0–44)
AST: 19 U/L (ref 15–41)
Albumin: 3.9 g/dL (ref 3.5–5.0)
Alkaline Phosphatase: 91 U/L (ref 38–126)
Anion gap: 9 (ref 5–15)
BUN: 23 mg/dL (ref 8–23)
CO2: 26 mmol/L (ref 22–32)
Calcium: 8.7 mg/dL — ABNORMAL LOW (ref 8.9–10.3)
Chloride: 102 mmol/L (ref 98–111)
Creatinine, Ser: 1.62 mg/dL — ABNORMAL HIGH (ref 0.44–1.00)
GFR calc Af Amer: 36 mL/min — ABNORMAL LOW (ref >60)
GFR calc non Af Amer: 31 mL/min — ABNORMAL LOW (ref >60)
Glucose, Bld: 118 mg/dL — ABNORMAL HIGH (ref 70–99)
Potassium: 3 mmol/L — ABNORMAL LOW (ref 3.5–5.1)
Sodium: 137 mmol/L (ref 135–145)
Total Bilirubin: 0.5 mg/dL (ref 0.3–1.2)
Total Protein: 7.3 g/dL (ref 6.5–8.1)

## 2020-03-17 LAB — PROTIME-INR
INR: 0.9 (ref 0.8–1.2)
Prothrombin Time: 12 seconds (ref 11.4–15.2)

## 2020-03-17 SURGERY — HYSTERECTOMY, VAGINAL, LAPAROSCOPY-ASSISTED, WITH SALPINGO-OOPHORECTOMY
Anesthesia: General | Site: Vagina

## 2020-03-17 MED ORDER — GABAPENTIN 600 MG PO TABS
600.0000 mg | ORAL_TABLET | Freq: Three times a day (TID) | ORAL | Status: DC
  Administered 2020-03-17 – 2020-03-18 (×4): 600 mg via ORAL
  Filled 2020-03-17 (×5): qty 1

## 2020-03-17 MED ORDER — OXYCODONE-ACETAMINOPHEN 5-325 MG PO TABS
1.0000 | ORAL_TABLET | ORAL | Status: DC | PRN
  Administered 2020-03-17 (×2): 1 via ORAL
  Administered 2020-03-17 – 2020-03-18 (×5): 2 via ORAL
  Filled 2020-03-17 (×3): qty 2
  Filled 2020-03-17: qty 1
  Filled 2020-03-17: qty 2
  Filled 2020-03-17: qty 1
  Filled 2020-03-17: qty 2

## 2020-03-17 MED ORDER — TRAMADOL HCL 50 MG PO TABS
50.0000 mg | ORAL_TABLET | Freq: Four times a day (QID) | ORAL | Status: DC | PRN
  Administered 2020-03-18: 50 mg via ORAL
  Filled 2020-03-17 (×2): qty 1

## 2020-03-17 MED ORDER — CEFAZOLIN SODIUM-DEXTROSE 2-4 GM/100ML-% IV SOLN
2.0000 g | INTRAVENOUS | Status: AC
  Administered 2020-03-17: 2 g via INTRAVENOUS

## 2020-03-17 MED ORDER — SODIUM CHLORIDE 0.9 % IV SOLN
INTRAVENOUS | Status: DC | PRN
  Administered 2020-03-17: 50 ug/min via INTRAVENOUS

## 2020-03-17 MED ORDER — SODIUM CHLORIDE (PF) 0.9 % IJ SOLN
INTRAMUSCULAR | Status: DC | PRN
  Administered 2020-03-17: 50 mL

## 2020-03-17 MED ORDER — SIMETHICONE 80 MG PO CHEW
80.0000 mg | CHEWABLE_TABLET | Freq: Four times a day (QID) | ORAL | Status: DC | PRN
  Administered 2020-03-18 (×3): 80 mg via ORAL
  Filled 2020-03-17 (×3): qty 1

## 2020-03-17 MED ORDER — GABAPENTIN 300 MG PO CAPS: 300.0000 mg | ORAL_CAPSULE | ORAL | Status: DC

## 2020-03-17 MED ORDER — KETAMINE HCL 50 MG/ML IJ SOLN
INTRAMUSCULAR | Status: DC | PRN
  Administered 2020-03-17: 50 mg via INTRAVENOUS

## 2020-03-17 MED ORDER — ESTROGENS, CONJUGATED 0.625 MG/GM VA CREA
TOPICAL_CREAM | VAGINAL | Status: AC
  Filled 2020-03-17: qty 30

## 2020-03-17 MED ORDER — TIZANIDINE HCL 2 MG PO TABS
2.0000 mg | ORAL_TABLET | Freq: Three times a day (TID) | ORAL | Status: DC | PRN
  Filled 2020-03-17: qty 1

## 2020-03-17 MED ORDER — ALBUTEROL SULFATE HFA 108 (90 BASE) MCG/ACT IN AERS
1.0000 | INHALATION_SPRAY | Freq: Four times a day (QID) | RESPIRATORY_TRACT | Status: DC | PRN
  Administered 2020-03-18: 2 via RESPIRATORY_TRACT
  Filled 2020-03-17 (×2): qty 6.7

## 2020-03-17 MED ORDER — SODIUM CHLORIDE (PF) 0.9 % IJ SOLN
INTRAMUSCULAR | Status: AC
  Filled 2020-03-17: qty 50

## 2020-03-17 MED ORDER — ACETAMINOPHEN 500 MG PO TABS
ORAL_TABLET | ORAL | Status: AC
  Administered 2020-03-17: 1000 mg
  Filled 2020-03-17: qty 2

## 2020-03-17 MED ORDER — ONDANSETRON HCL 4 MG/2ML IJ SOLN: 4.0000 mg | Freq: Four times a day (QID) | INTRAMUSCULAR | Status: DC | PRN

## 2020-03-17 MED ORDER — VASOPRESSIN 20 UNIT/ML IV SOLN
INTRAVENOUS | Status: DC | PRN
  Administered 2020-03-17: 20 [IU]

## 2020-03-17 MED ORDER — ENTECAVIR 0.5 MG PO TABS: 0.5000 mg | ORAL_TABLET | ORAL | Status: DC

## 2020-03-17 MED ORDER — PANTOPRAZOLE SODIUM 40 MG PO TBEC
40.0000 mg | DELAYED_RELEASE_TABLET | Freq: Every day | ORAL | Status: DC
  Administered 2020-03-18: 40 mg via ORAL
  Filled 2020-03-17: qty 1

## 2020-03-17 MED ORDER — HYDROMORPHONE HCL 1 MG/ML IJ SOLN: 0.2000 mg | INTRAMUSCULAR | Status: DC | PRN

## 2020-03-17 MED ORDER — WARFARIN SODIUM 1 MG PO TABS
1.0000 mg | ORAL_TABLET | Freq: Every day | ORAL | Status: DC
  Administered 2020-03-17: 1 mg via ORAL
  Filled 2020-03-17 (×2): qty 1

## 2020-03-17 MED ORDER — ADULT MULTIVITAMIN W/MINERALS CH
1.0000 | ORAL_TABLET | Freq: Every day | ORAL | Status: DC
  Administered 2020-03-18: 1 via ORAL
  Filled 2020-03-17: qty 1

## 2020-03-17 MED ORDER — LACTATED RINGERS IV SOLN: INTRAVENOUS | Status: DC

## 2020-03-17 MED ORDER — ONDANSETRON HCL 4 MG/2ML IJ SOLN: 4.0000 mg | Freq: Once | INTRAMUSCULAR | Status: DC | PRN

## 2020-03-17 MED ORDER — SUGAMMADEX SODIUM 200 MG/2ML IV SOLN
INTRAVENOUS | Status: DC | PRN
  Administered 2020-03-17: 200 mg via INTRAVENOUS

## 2020-03-17 MED ORDER — KETAMINE HCL 50 MG/ML IJ SOLN
INTRAMUSCULAR | Status: AC
  Filled 2020-03-17: qty 10

## 2020-03-17 MED ORDER — ONDANSETRON HCL 4 MG PO TABS: 4.0000 mg | ORAL_TABLET | Freq: Four times a day (QID) | ORAL | Status: DC | PRN

## 2020-03-17 MED ORDER — BUPIVACAINE HCL (PF) 0.5 % IJ SOLN
INTRAMUSCULAR | Status: AC
  Filled 2020-03-17: qty 30

## 2020-03-17 MED ORDER — SENNOSIDES-DOCUSATE SODIUM 8.6-50 MG PO TABS
1.0000 | ORAL_TABLET | Freq: Every evening | ORAL | Status: DC | PRN
  Administered 2020-03-18: 1 via ORAL
  Filled 2020-03-17: qty 1

## 2020-03-17 MED ORDER — ROCURONIUM BROMIDE 100 MG/10ML IV SOLN
INTRAVENOUS | Status: DC | PRN
  Administered 2020-03-17: 40 mg via INTRAVENOUS
  Administered 2020-03-17: 20 mg via INTRAVENOUS

## 2020-03-17 MED ORDER — LIDOCAINE HCL (CARDIAC) PF 100 MG/5ML IV SOSY
PREFILLED_SYRINGE | INTRAVENOUS | Status: DC | PRN
  Administered 2020-03-17: 60 mg via INTRAVENOUS

## 2020-03-17 MED ORDER — MIDAZOLAM HCL 2 MG/2ML IJ SOLN
INTRAMUSCULAR | Status: AC
  Filled 2020-03-17: qty 2

## 2020-03-17 MED ORDER — PROPOFOL 10 MG/ML IV BOLUS
INTRAVENOUS | Status: DC | PRN
  Administered 2020-03-17: 120 mg via INTRAVENOUS

## 2020-03-17 MED ORDER — ASPIRIN 81 MG PO CHEW
81.0000 mg | CHEWABLE_TABLET | Freq: Every day | ORAL | Status: DC
  Administered 2020-03-18: 81 mg via ORAL
  Filled 2020-03-17 (×2): qty 1

## 2020-03-17 MED ORDER — CITALOPRAM HYDROBROMIDE 20 MG PO TABS
40.0000 mg | ORAL_TABLET | ORAL | Status: DC
  Administered 2020-03-18: 40 mg via ORAL
  Filled 2020-03-17 (×2): qty 2

## 2020-03-17 MED ORDER — ENOXAPARIN SODIUM 40 MG/0.4ML ~~LOC~~ SOLN
SUBCUTANEOUS | Status: AC
  Administered 2020-03-17: 40 mg
  Filled 2020-03-17: qty 0.4

## 2020-03-17 MED ORDER — PROPOFOL 10 MG/ML IV BOLUS
INTRAVENOUS | Status: AC
  Filled 2020-03-17: qty 20

## 2020-03-17 MED ORDER — FUROSEMIDE 20 MG PO TABS
20.0000 mg | ORAL_TABLET | Freq: Every day | ORAL | Status: DC
  Administered 2020-03-18: 20 mg via ORAL
  Filled 2020-03-17 (×2): qty 1

## 2020-03-17 MED ORDER — FERROUS GLUCONATE 324 (38 FE) MG PO TABS
324.0000 mg | ORAL_TABLET | Freq: Every day | ORAL | Status: DC
  Administered 2020-03-18: 324 mg via ORAL
  Filled 2020-03-17 (×2): qty 1

## 2020-03-17 MED ORDER — CHLORHEXIDINE GLUCONATE 0.12 % MT SOLN
OROMUCOSAL | Status: AC
  Administered 2020-03-17: 15 mL
  Filled 2020-03-17: qty 15

## 2020-03-17 MED ORDER — MAGNESIUM CITRATE PO SOLN
1.0000 | Freq: Once | ORAL | Status: DC | PRN
  Filled 2020-03-17: qty 296

## 2020-03-17 MED ORDER — MOMETASONE FURO-FORMOTEROL FUM 200-5 MCG/ACT IN AERO
2.0000 | INHALATION_SPRAY | Freq: Two times a day (BID) | RESPIRATORY_TRACT | Status: DC
  Administered 2020-03-17 – 2020-03-18 (×2): 2 via RESPIRATORY_TRACT
  Filled 2020-03-17: qty 8.8

## 2020-03-17 MED ORDER — ESTROGENS, CONJUGATED 0.625 MG/GM VA CREA
TOPICAL_CREAM | VAGINAL | Status: DC | PRN
  Administered 2020-03-17: 1 via VAGINAL

## 2020-03-17 MED ORDER — LIDOCAINE-EPINEPHRINE 1 %-1:100000 IJ SOLN
INTRAMUSCULAR | Status: AC
  Filled 2020-03-17: qty 1

## 2020-03-17 MED ORDER — MENTHOL 3 MG MT LOZG
1.0000 | LOZENGE | OROMUCOSAL | Status: DC | PRN
  Filled 2020-03-17: qty 9

## 2020-03-17 MED ORDER — ORAL CARE MOUTH RINSE: 15.0000 mL | Freq: Once | OROMUCOSAL | Status: DC

## 2020-03-17 MED ORDER — CHLORHEXIDINE GLUCONATE 0.12 % MT SOLN: 15.0000 mL | Freq: Once | OROMUCOSAL | Status: DC

## 2020-03-17 MED ORDER — VASOPRESSIN 20 UNIT/ML IV SOLN
INTRAVENOUS | Status: AC
  Filled 2020-03-17: qty 1

## 2020-03-17 MED ORDER — ACETAMINOPHEN 500 MG PO TABS: 1000.0000 mg | ORAL_TABLET | ORAL | Status: DC

## 2020-03-17 MED ORDER — DEXAMETHASONE SODIUM PHOSPHATE 10 MG/ML IJ SOLN
INTRAMUSCULAR | Status: AC
  Filled 2020-03-17: qty 1

## 2020-03-17 MED ORDER — DOCUSATE SODIUM 100 MG PO CAPS
100.0000 mg | ORAL_CAPSULE | Freq: Two times a day (BID) | ORAL | Status: DC
  Administered 2020-03-17 – 2020-03-18 (×2): 100 mg via ORAL
  Filled 2020-03-17 (×2): qty 1

## 2020-03-17 MED ORDER — FENTANYL CITRATE (PF) 250 MCG/5ML IJ SOLN
INTRAMUSCULAR | Status: AC
  Filled 2020-03-17: qty 5

## 2020-03-17 MED ORDER — WARFARIN - PHYSICIAN DOSING INPATIENT: Freq: Every day | Status: DC

## 2020-03-17 MED ORDER — BISACODYL 10 MG RE SUPP: 10.0000 mg | Freq: Every day | RECTAL | Status: DC | PRN

## 2020-03-17 MED ORDER — 0.9 % SODIUM CHLORIDE (POUR BTL) OPTIME
TOPICAL | Status: DC | PRN
  Administered 2020-03-17 (×2): 500 mL

## 2020-03-17 MED ORDER — BUPIVACAINE HCL 0.5 % IJ SOLN
INTRAMUSCULAR | Status: DC | PRN
  Administered 2020-03-17: 28 mL

## 2020-03-17 MED ORDER — GABAPENTIN 300 MG PO CAPS
ORAL_CAPSULE | ORAL | Status: AC
  Filled 2020-03-17: qty 1

## 2020-03-17 MED ORDER — PHENYLEPHRINE HCL (PRESSORS) 10 MG/ML IV SOLN
INTRAVENOUS | Status: DC | PRN
  Administered 2020-03-17: 200 ug via INTRAVENOUS
  Administered 2020-03-17: 100 ug via INTRAVENOUS
  Administered 2020-03-17: 200 ug via INTRAVENOUS

## 2020-03-17 MED ORDER — FENTANYL CITRATE (PF) 250 MCG/5ML IJ SOLN
INTRAMUSCULAR | Status: DC | PRN
Start: 2020-03-17 — End: 2020-03-17
  Administered 2020-03-17: 100 ug via INTRAVENOUS
  Administered 2020-03-17: 50 ug via INTRAVENOUS

## 2020-03-17 MED ORDER — POVIDONE-IODINE 10 % EX SWAB: 2.0000 "application " | Freq: Once | CUTANEOUS | Status: DC

## 2020-03-17 MED ORDER — ONDANSETRON HCL 4 MG/2ML IJ SOLN
INTRAMUSCULAR | Status: AC
  Filled 2020-03-17: qty 2

## 2020-03-17 MED ORDER — ROCURONIUM BROMIDE 10 MG/ML (PF) SYRINGE
PREFILLED_SYRINGE | INTRAVENOUS | Status: AC
  Filled 2020-03-17: qty 10

## 2020-03-17 MED ORDER — FENTANYL CITRATE (PF) 100 MCG/2ML IJ SOLN
25.0000 ug | INTRAMUSCULAR | Status: DC | PRN
  Administered 2020-03-17 (×3): 25 ug via INTRAVENOUS

## 2020-03-17 MED ORDER — LIDOCAINE HCL 4 % MT SOLN
OROMUCOSAL | Status: DC | PRN
  Administered 2020-03-17: 4 mL via TOPICAL

## 2020-03-17 MED ORDER — MIDAZOLAM HCL 2 MG/2ML IJ SOLN
INTRAMUSCULAR | Status: DC | PRN
  Administered 2020-03-17: 1 mg via INTRAVENOUS

## 2020-03-17 MED ORDER — ONDANSETRON HCL 4 MG/2ML IJ SOLN
INTRAMUSCULAR | Status: DC | PRN
  Administered 2020-03-17: 4 mg via INTRAVENOUS

## 2020-03-17 MED ORDER — DEXAMETHASONE SODIUM PHOSPHATE 10 MG/ML IJ SOLN
INTRAMUSCULAR | Status: DC | PRN
  Administered 2020-03-17: 10 mg via INTRAVENOUS

## 2020-03-17 MED ORDER — LIDOCAINE HCL (PF) 2 % IJ SOLN
INTRAMUSCULAR | Status: AC
  Filled 2020-03-17: qty 5

## 2020-03-17 MED ORDER — PHENYLEPHRINE HCL (PRESSORS) 10 MG/ML IV SOLN
INTRAVENOUS | Status: AC
  Filled 2020-03-17: qty 1

## 2020-03-17 MED ORDER — ALUM & MAG HYDROXIDE-SIMETH 200-200-20 MG/5ML PO SUSP: 30.0000 mL | ORAL | Status: DC | PRN

## 2020-03-17 MED ORDER — CEFAZOLIN SODIUM-DEXTROSE 2-4 GM/100ML-% IV SOLN
INTRAVENOUS | Status: AC
  Filled 2020-03-17: qty 100

## 2020-03-17 MED ORDER — ENOXAPARIN SODIUM 40 MG/0.4ML ~~LOC~~ SOLN
40.0000 mg | Freq: Once | SUBCUTANEOUS | Status: AC
  Administered 2020-03-17: 40 mg via SUBCUTANEOUS

## 2020-03-17 MED ORDER — FENTANYL CITRATE (PF) 100 MCG/2ML IJ SOLN
INTRAMUSCULAR | Status: AC
  Administered 2020-03-17: 25 ug via INTRAVENOUS
  Filled 2020-03-17: qty 2

## 2020-03-17 SURGICAL SUPPLY — 87 items
ADH SKN CLS APL DERMABOND .7 (GAUZE/BANDAGES/DRESSINGS) ×2
APL PRP STRL LF DISP 70% ISPRP (MISCELLANEOUS) ×2
BAG COUNTER SPONGE EZ (MISCELLANEOUS) IMPLANT
BAG DRN RND TRDRP ANRFLXCHMBR (UROLOGICAL SUPPLIES) ×2
BAG SPEC RTRVL LRG 6X4 10 (ENDOMECHANICALS)
BAG SPNG 4X4 CLR HAZ (MISCELLANEOUS)
BAG URINE DRAIN 2000ML AR STRL (UROLOGICAL SUPPLIES) ×3 IMPLANT
BLADE SURG 15 STRL LF DISP TIS (BLADE) IMPLANT
BLADE SURG 15 STRL SS (BLADE)
BLADE SURG SZ11 CARB STEEL (BLADE) ×3 IMPLANT
BNDG GAUZE 4.5X4.1 6PLY STRL (MISCELLANEOUS) ×3 IMPLANT
CANISTER SUCT 1200ML W/VALVE (MISCELLANEOUS) ×3 IMPLANT
CATH FOLEY 2WAY  5CC 16FR (CATHETERS) ×1
CATH FOLEY 2WAY 5CC 16FR (CATHETERS) ×2
CATH ROBINSON RED A/P 16FR (CATHETERS) IMPLANT
CATH URTH 16FR FL 2W BLN LF (CATHETERS) ×2 IMPLANT
CHLORAPREP W/TINT 26 (MISCELLANEOUS) ×3 IMPLANT
CORD MONOPOLAR M/FML 12FT (MISCELLANEOUS) IMPLANT
COVER WAND RF STERILE (DRAPES) ×3 IMPLANT
DERMABOND ADVANCED (GAUZE/BANDAGES/DRESSINGS) ×1
DERMABOND ADVANCED .7 DNX12 (GAUZE/BANDAGES/DRESSINGS) ×2 IMPLANT
DRAPE 3/4 80X56 (DRAPES) ×3 IMPLANT
DRAPE LAPAROTOMY 100X77 ABD (DRAPES) ×3 IMPLANT
DRAPE PERI LITHO V/GYN (MISCELLANEOUS) ×3 IMPLANT
DRAPE UNDER BUTTOCK W/FLU (DRAPES) ×3 IMPLANT
ELECT CAUTERY BLADE 6.4 (BLADE) ×3 IMPLANT
ELECT REM PT RETURN 9FT ADLT (ELECTROSURGICAL) ×3
ELECTRODE REM PT RTRN 9FT ADLT (ELECTROSURGICAL) ×2 IMPLANT
GAUZE 4X4 16PLY RFD (DISPOSABLE) ×6 IMPLANT
GAUZE PACK 2X3YD (PACKING) ×3 IMPLANT
GAUZE PACKING 1/2X5YD (GAUZE/BANDAGES/DRESSINGS) ×3 IMPLANT
GLOVE BIO SURGEON STRL SZ 6.5 (GLOVE) ×9 IMPLANT
GLOVE BIO SURGEON STRL SZ8 (GLOVE) ×3 IMPLANT
GLOVE BIOGEL PI ORTHO PRO 7.5 (GLOVE) ×1
GLOVE INDICATOR 7.0 STRL GRN (GLOVE) ×6 IMPLANT
GLOVE PI ORTHO PRO STRL 7.5 (GLOVE) ×2 IMPLANT
GOWN STRL REUS W/ TWL LRG LVL3 (GOWN DISPOSABLE) ×12 IMPLANT
GOWN STRL REUS W/ TWL XL LVL3 (GOWN DISPOSABLE) ×2 IMPLANT
GOWN STRL REUS W/TWL LRG LVL3 (GOWN DISPOSABLE) ×18
GOWN STRL REUS W/TWL XL LVL3 (GOWN DISPOSABLE) ×3
GRASPER SUT TROCAR 14GX15 (MISCELLANEOUS) IMPLANT
IRRIGATION STRYKERFLOW (MISCELLANEOUS) IMPLANT
IRRIGATOR STRYKERFLOW (MISCELLANEOUS)
IV LACTATED RINGERS 1000ML (IV SOLUTION) IMPLANT
KIT PINK PAD W/HEAD ARE REST (MISCELLANEOUS) ×3
KIT PINK PAD W/HEAD ARM REST (MISCELLANEOUS) ×2 IMPLANT
KIT TURNOVER CYSTO (KITS) ×3 IMPLANT
LABEL OR SOLS (LABEL) ×3 IMPLANT
LIGHT WAVEGUIDE WIDE FLAT (MISCELLANEOUS) ×3 IMPLANT
NDL SAFETY ECLIPSE 18X1.5 (NEEDLE) IMPLANT
NEEDLE HYPO 18GX1.5 SHARP (NEEDLE)
NEEDLE HYPO 22GX1.5 SAFETY (NEEDLE) ×3 IMPLANT
NEEDLE SPNL 22GX3.5 QUINCKE BK (NEEDLE) ×3 IMPLANT
NS IRRIG 500ML POUR BTL (IV SOLUTION) ×6 IMPLANT
PACK BASIN MINOR (MISCELLANEOUS) ×3 IMPLANT
PACK GYN LAPAROSCOPIC (MISCELLANEOUS) ×3 IMPLANT
PAD OB MATERNITY 4.3X12.25 (PERSONAL CARE ITEMS) ×3 IMPLANT
PAD PREP 24X41 OB/GYN DISP (PERSONAL CARE ITEMS) ×3 IMPLANT
POUCH ENDO CATCH 10MM SPEC (MISCELLANEOUS) IMPLANT
POUCH SPECIMEN RETRIEVAL 10MM (ENDOMECHANICALS) IMPLANT
RETRACTOR YANK SUCT EIGR SABER (INSTRUMENTS) ×3 IMPLANT
SCISSORS METZENBAUM CVD 33 (INSTRUMENTS) IMPLANT
SET CYSTO W/LG BORE CLAMP LF (SET/KITS/TRAYS/PACK) IMPLANT
SET TUBE SMOKE EVAC HIGH FLOW (TUBING) ×3 IMPLANT
SHEARS HARMONIC ACE PLUS 36CM (ENDOMECHANICALS) ×3 IMPLANT
SLEEVE ENDOPATH XCEL 5M (ENDOMECHANICALS) ×3 IMPLANT
SLING TRANSOBTURATOR OBTRYX (Sling) ×3 IMPLANT
STRAP SAFETY 5IN WIDE (MISCELLANEOUS) ×3 IMPLANT
STRIP CLOSURE SKIN 1/2X4 (GAUZE/BANDAGES/DRESSINGS) ×3 IMPLANT
SURGILUBE 2OZ TUBE FLIPTOP (MISCELLANEOUS) ×3 IMPLANT
SUT CHROMIC 2 0 CT 1 (SUTURE) ×6 IMPLANT
SUT VIC AB 0 CT1 27 (SUTURE) ×9
SUT VIC AB 0 CT1 27XCR 8 STRN (SUTURE) ×6 IMPLANT
SUT VIC AB 0 CT1 36 (SUTURE) ×6 IMPLANT
SUT VIC AB 0 CT2 27 (SUTURE) IMPLANT
SUT VIC AB 2-0 CT1 (SUTURE) IMPLANT
SUT VIC AB 2-0 UR6 27 (SUTURE) ×3 IMPLANT
SUT VIC AB 3-0 SH 27 (SUTURE)
SUT VIC AB 3-0 SH 27X BRD (SUTURE) IMPLANT
SUT VICRYL 0 AB UR-6 (SUTURE) IMPLANT
SUT VICRYL+ 3-0 36IN CT-1 (SUTURE) IMPLANT
SYR 10ML LL (SYRINGE) ×3 IMPLANT
SYR CONTROL 10ML LL (SYRINGE) ×3 IMPLANT
SYRINGE IRR TOOMEY STRL 70CC (SYRINGE) ×3 IMPLANT
TROCAR ENDO BLADELESS 11MM (ENDOMECHANICALS) ×3 IMPLANT
TROCAR XCEL NON-BLD 5MMX100MML (ENDOMECHANICALS) ×3 IMPLANT
TROCAR XCEL UNIV SLVE 11M 100M (ENDOMECHANICALS) ×3 IMPLANT

## 2020-03-17 NOTE — Care Management CC44 (Signed)
Condition Code 44 Documentation Completed  Patient Details  Name: Erin [name] MRN: [id number] Date of Birth: Oct 01, 1945   Condition Code 44 given:  Yes Patient signature on Condition Code 44 notice:  Yes Documentation of 2 MD's agreement:  Yes Code 44 added to claim:     CSW gave Code 44 to patient. However, patient very drowsy from anesthesia. CSW will return in the morning to ensure patient understood what she was signing.  [place], LCSW 03/17/2020, 4:11 PM

## 2020-03-17 NOTE — Transfer of Care (Signed)
Immediate Anesthesia Transfer of Care Note  Patient: Erin [name]  Procedure(s) Performed: LAPAROSCOPIC ASSISTED VAGINAL HYSTERECTOMY WITH SALPINGO OOPHORECTOMY (Bilateral Vagina ) ANTERIOR (CYSTOCELE) AND POSTERIOR REPAIR (RECTOCELE) (N/A Vagina ) PUBO-VAGINAL SLING-TVT (N/A Vagina )  Patient Location: PACU  Anesthesia Type:General  Level of Consciousness: drowsy and patient cooperative  Airway & Oxygen Therapy: Patient Spontanous Breathing and Patient connected to face mask oxygen  Post-op Assessment: Report given to RN and Post -op Vital signs reviewed and stable  Post vital signs: Reviewed and stable  Last Vitals:  Vitals Value Taken Time  BP 102/68 03/17/20 1052  Temp 36.2 C 03/17/20 1047  Pulse 77 03/17/20 1053  Resp 16 03/17/20 1053  SpO2 96 % 03/17/20 1053  Vitals shown include unvalidated device data.  Last Pain:  Vitals:   03/17/20 1047  TempSrc:   PainSc: Asleep         Complications: No complications documented.

## 2020-03-17 NOTE — Op Note (Signed)
Procedure(s): LAPAROSCOPIC ASSISTED VAGINAL HYSTERECTOMY WITH BILATERAL SALPINGO OOPHORECTOMY  POSTERIOR REPAIR (RECTOCELE) PUBO-VAGINAL SLING-TOT Procedure Note  Erin [name] female 74 y.o. 03/17/2020  Indications: The patient is a 74 y.o. [id number] female with cystocele, rectocele, incomplete uterine prolapse, and stress incontinence.   Pre-operative Diagnosis: Cystocele, rectocele, incomplete uterine prolapse, and stress incontinence  Post-operative Diagnosis:  Same  Surgeon: Rubie Maid, MD  Assistants:  Jeannie Fend, MD. An experienced assistant was required given the standard of surgical care given the complexity of the case.  This assistant was needed for exposure, dissection, suctioning, retraction, instrument exchange, and for overall help during the procedure.  Anesthesia: General endotracheal anesthesia  Procedure Details: The patient was seen in the Holding Room. The risks, benefits, complications, treatment options, and expected outcomes were discussed with the patient.  The patient concurred with the proposed plan, giving informed consent.  The site of surgery properly noted/marked. The patient was taken to the Operating Room, identified as Erin [name] and the procedure verified as Procedure(s) (LRB): LAPAROSCOPIC ASSISTED VAGINAL HYSTERECTOMY WITH SALPINGO OOPHORECTOMY (Bilateral) ANTERIOR (CYSTOCELE) AND POSTERIOR REPAIR (RECTOCELE) (N/A) PUBO-VAGINAL SLING-TOT (N/A).    She was then placed under general anesthesia without difficulty. She was placed in the dorsal lithotomy position, and was prepped and draped in a sterile manner.  A Foley catheter was inserted into her bladder, and attached to constant drainage. A sterile speculum was inserted into the vagina and the cervix was grasped at the anterior lip using a single-toothed tenaculum.  A Hulka clamp was placed for uterine manipulation.  The speculum and tenaculum were then removed. After an adequate timeout was  performed, attention was turned to the abdomen where an umbilical incision was made with the scalpel.  The Optiview 5-mm trocar and sleeve were then advanced without difficulty with the laparoscope under direct visualization into the abdomen.  The abdomen was then insufflated with carbon dioxide gas and adequate pneumoperitoneum was obtained. Two 5-mm lower lower quadrant ports were then placed under direct visualization.  A survey of the patient's pelvis and abdomen revealed the findings as above.    Attention was turned to the infundibulopelvic ligament on the patient's right side, which was clamped using the Harmonic scalpel and ligated. The broad ligament was also ligated with the Harmonic scalpel working towards the round ligament.  The round and broad ligaments were then clamped and transected with the Harmonic scalpel.  The ureter were noted to be safely away from the area of dissection.  The uterine artery was then skeletonized and a bladder flap was created.  The bladder was then bluntly dissected off the lower uterine segment.  At this point, attention was turned to the right uterine vessels, which were clamped and ligated using the Harmonic scalpel.  Attention was then turned to the patient's left side, which was treated in a similar manner by taking the infundibulopelvic ligament, the round ligament and the broad ligaments and the bladder flap creation was completed. The left uterine vessels were also clamped and transected in a similar fashion.  A survey the operative site was performed and good hemostasis was noted throughout.  No intraoperative injury to other surrounding organs was noted.  The abdomen was desufflated and all instruments were then removed from the patient's abdomen.   All skin incisions were closed with a subcuticular stitch of 4-0 Vicryl and Dermabond. The incisions were then injected with a total of 18 ml of 0.5% Sensorcaine. The decision was made to proceed with completing the  hysterectomy via the vaginal route .  Attention was then turned to her pelvis.  A weighted speculum was then placed in the vagina, and the anterior and posterior lips of the cervix were grasped bilaterally with tenaculums.  The cervix was then injected circumferentially with Pitressin solution (20 ml in 50 ml of normal saline) to maintain hemostasis.  The cervix was then circumferentially incised, and the posterior cul-de-sac was entered sharply without difficulty.  A long weighted speculum was inserted into the posterior cul-de-sac. The Heaney clamp was then used to clamp the uterosacral ligaments on either side.  They were then cut and sutured ligated with 0 Vicryl, and were held with a tag for later identification. Of note, all sutures used in this case were 0 Vicryl unless otherwise noted.   The cardinal ligaments were then clamped, cut and ligated bilaterally.  At this point, full entry into the anterior cul-de-sac was made, no injury to the bladder was noted.  The uterus, tubes and ovaries were freed from all ligaments and was then delivered and sent to pathology. The posterior vaginal cuff was then reperitonealized using a 0-Vicryl suture.  The peritoneum was then closed with a purse-string suture using 0-Vicryl.    On further examination after hysterectomy, it was noted that the cystocele had resolved.  The anterior vagina was grasped at 2 o'clock and 8 o'clock with Alice clamps.  The anterior vagina was already within 2 cm of the urethral meatus so no incision was needed. The vaginal mucosa was sharply dissected laterally. the paraurethral and paravescial fascia was mobilized by blunt and sharp dissection. Bilateral 1 cm groin incisions were made into the skin after a total of 10 ml of Petressin was injected. The Monarch Trans-obturator Sling was then placed per manufacturer instructions.  Interrupted sutures of 2.0 Vicryl were placed in the paraurethral fascia to the pubourethral ligament at the  urethral-vesical junction. The remainder of the paraurethral fascia was reapproximated using similar sutures. The anterior vaginal mucosa was closed with interrupted sutures of 0-Vicryl with care given to incorporate the uterosacral pedicles bilaterally.  Attention was then turned to the posterior vaginal wall.  An inverted triangle of skin  was cut in the perineum. The posterior vaginal wall was opened vertically and midline up to the apex of the rectocele. The cut edges were held and splayed laterally with a series of Allis clamps. The open vaginal mucosa was then dissected laterally with a combination of sharp and blunt dissection, exposing the perirectal fascia. The perirectal fascia was then reapproximated with interrupted 2-0 Vicryl sutures to draw the lateral folds together and tuck the rectocele. The levator ani muscles were also approximated with 2-0 Vicryl sutures.  The excess vaginal mucosa was trimmed. The posterior vaginal wall was closed with a running suture of 0-Vicryl to the hymenal tags and the perineum was closed with 2-0 Vicryl in a figure-of-eight closure.   All instruments were then removed from the pelvis. All instruments, needles, and sponge counts were correct  times two. A vaginal packing saturated with estrogen cream was placed. The patient tolerated the procedures well.   The patient was taken to the recovery room awake, extubated and in stable condition.   Findings: Small uterus, normal adnexa bilaterally. No abdominal/pelvic abnormality.  Normal upper abdomen.Small cystocele and rectocele present (cystocele reduced after hysterectomy spontaneously).  Cloudy urine noted on insertion of catheter.  Estimated Blood Loss:  100 ml      Drains: foley catheter to gravity with 110 ml  of cloudy urine         Total IV Fluids:  800 ml  Specimens: Uterus with cervix, bilateral fallopian tubes and ovaries. Urine sent for culture.         Implants: None         Complications:   None; patient tolerated the procedure well.         Disposition: PACU - hemodynamically stable.         Condition: stable   Rubie Maid, MD [facility]

## 2020-03-17 NOTE — Anesthesia Preprocedure Evaluation (Signed)
Anesthesia Evaluation  Patient identified by MRN, date of birth, ID band Patient awake    Reviewed: Allergy & Precautions, NPO status , Patient's Chart, lab work & pertinent test results  History of Anesthesia Complications Negative for: history of anesthetic complications  Airway Mallampati: II       Dental  (+) Upper Dentures, Edentulous Lower   Pulmonary asthma , neg sleep apnea, neg COPD, Not current smoker,           Cardiovascular hypertension, Pt. on medications (-) Past MI and (-) CHF (-) dysrhythmias (-) Valvular Problems/Murmurs     Neuro/Psych neg Seizures Anxiety    GI/Hepatic hiatal hernia, PUD, GERD  Medicated,(+) Hepatitis -, B  Endo/Other  neg diabetes  Renal/GU Renal InsufficiencyRenal disease     Musculoskeletal   Abdominal   Peds  Hematology   Anesthesia Other Findings   Reproductive/Obstetrics                            Anesthesia Physical Anesthesia Plan  ASA: III  Anesthesia Plan: General   Post-op Pain Management:    Induction: Intravenous  PONV Risk Score and Plan: 3  Airway Management Planned: Oral ETT  Additional Equipment:   Intra-op Plan:   Post-operative Plan:   Informed Consent: I have reviewed the patients History and Physical, chart, labs and discussed the procedure including the risks, benefits and alternatives for the proposed anesthesia with the patient or authorized representative who has indicated his/her understanding and acceptance.       Plan Discussed with:   Anesthesia Plan Comments:         Anesthesia Quick Evaluation

## 2020-03-17 NOTE — Interval H&P Note (Signed)
History and Physical Interval Note:  03/17/2020 7:28 AM  [name]  has presented today for surgery, with the diagnosis of Cystocele with rectocele, stress incontinence, and incomplete uterine prolapse.  The various methods of treatment have been discussed with the patient and family. After consideration of risks, benefits and other options for treatment, the patient has consented to  Procedure(s): LAPAROSCOPIC ASSISTED VAGINAL HYSTERECTOMY WITH SALPINGO OOPHORECTOMY (Bilateral), ANTERIOR (CYSTOCELE) AND POSTERIOR REPAIR (RECTOCELE) (N/A), PUBO-VAGINAL SLING-TVT (N/A) as a surgical intervention.  The patient's history has been reviewed, patient examined, no change in status, stable for surgery.  I have reviewed the patient's chart and labs.  Questions were answered to the patient's satisfaction.     Rubie Maid, MD [facility]

## 2020-03-17 NOTE — Anesthesia Procedure Notes (Signed)
Procedure Name: Intubation Date/Time: 03/17/2020 7:38 AM Performed by: Frazier, Susan, CRNA Pre-anesthesia Checklist: Patient identified, Emergency Drugs available, Suction available and Patient being monitored Patient Re-evaluated:Patient Re-evaluated prior to induction Oxygen Delivery Method: Circle system utilized Preoxygenation: Pre-oxygenation with 100% oxygen Induction Type: IV induction Ventilation: Mask ventilation without difficulty Laryngoscope Size: Mac and 3 Grade View: Grade I Tube type: Oral Tube size: 7.0 mm Number of attempts: 1 Airway Equipment and Method: Stylet and LTA kit utilized Placement Confirmation: ETT inserted through vocal cords under direct vision,  positive ETCO2 and breath sounds checked- equal and bilateral Secured at: 20 cm Tube secured with: Tape Dental Injury: Teeth and Oropharynx as per pre-operative assessment        

## 2020-03-17 NOTE — Discharge Instructions (Signed)
Urethral Vaginal Sling, Care After This sheet gives you information about how to care for yourself after your procedure. Your health care provider may also give you more specific instructions. If you have problems or questions, contact your health care provider. What can I expect after the procedure? After the procedure:  It is common to have some abdominal pain. Your health care provider will give you pain medicines for this.  You may also have a gauze packing in the vagina to prevent bleeding. This will be removed in 1-2 days. Follow these instructions at home: Incision care   Follow instructions from your health care provider about how to take care of your abdominal incision. Make sure you: ? Wash your hands with soap and water before you change your bandage (dressing). If soap and water are not available, use hand sanitizer. ? Change your dressing as told by your health care provider. ? Leave stitches (sutures), skin glue, or adhesive strips in place. These skin closures may need to stay in place for 2 weeks or longer. If adhesive strip edges start to loosen and curl up, you may trim the loose edges. Do not remove adhesive strips completely unless your health care provider tells you to do that.  Check your incision area every day for signs of infection. Check for: ? Redness, swelling, or pain. ? Fluid or blood. ? Warmth. ? Pus or a bad smell. Activity  Get plenty of rest.  Limit exercise and activities as told by your health care provider.  Do not lift anything that is heavier than 5 pounds (2.3 kg) until your health care provider says that it is safe.  Do not douche, use tampons, or have sexual intercourse for 6 weeks after your procedure or as told by your health care provider.  Do not drive or use heavy machinery while taking prescription pain medicine.  Return to your normal activities as told by your health care provider. Ask your health care provider what activities are  safe for you. General instructions  If you have a urinary catheter in place, follow instructions from your health care provider about how to empty the catheter bag.  Do not take baths, swim, or use a hot tub until your health care provider approves. Ask your health care provider if you may take showers. You may only be allowed to take sponge baths.  Take over-the-counter and prescription medicines only as told by your health care provider. Do not take aspirin because it can cause bleeding.  Do not use any products that contain nicotine or tobacco, such as cigarettes and e-cigarettes. These can delay bone healing. If you need help quitting, ask your health care provider.  You may resume your usual diet. Eat a well-balanced diet.  To prevent or treat constipation while you are taking prescription pain medicine, your health care provider may recommend that you: ? Drink enough fluid to keep your urine pale yellow. ? Take over-the-counter or prescription medicines. ? Eat foods that are high in fiber, such as fresh fruits and vegetables, whole grains, and beans. ? Limit foods that are high in fat and processed sugars, such as fried and sweet foods.  Avoid straining when having a bowel movement.  Keep all follow-up visits as told by your health care provider. This is important. Contact a health care provider if:  You have a heavy or bad smelling vaginal discharge.  You have bruising in the vaginal area.  You have pain that is not controlled with medicines.    Your incision feels warm to the touch.  You have redness, swelling, or pain around your incision.  You have pus or a bad smell coming from your incision.  You have fluid or blood coming from your incision.  You feel faint or light-headed.  You have a rash. Get help right away if:  You have a fever.  You faint.  You have shortness of breath.  You have chest, abdominal, or leg pain.  You have pain when urinating or cannot  urinate.  Your catheter is still in your bladder and it becomes blocked.  You have vaginal bleeding.  You have swelling, redness, and pain in the vaginal area. Summary  After the procedure, it is common to have some abdominal pain. Your health care provider will give you pain medicines for this.  Follow instructions from your health care provider about how to take care of your incision.  Limit exercise and activities as told by your health care provider.  Contact your health care provider if you have any signs of infection after your surgery. This information is not intended to replace advice given to you by your health care provider. Make sure you discuss any questions you have with your health care provider. Document Revised: 05/27/2017 Document Reviewed: 09/22/2016 Elsevier Patient Education  2020 [employer] Laparoscopically Assisted Vaginal Hysterectomy, Care After This sheet gives you information about how to care for yourself after your procedure. Your health care provider may also give you more specific instructions. If you have problems or questions, contact your health care provider. What can I expect after the procedure? After the procedure, it is common to have:  Soreness and numbness in your incision areas.  Abdominal pain. You will be given pain medicine to control it.  Vaginal bleeding and discharge. You will need to use a sanitary napkin after this procedure.  Sore throat from the breathing tube that was inserted during surgery. Follow these instructions at home: Medicines  Take over-the-counter and prescription medicines only as told by your health care provider.  Do not take aspirin or ibuprofen. These medicines can cause bleeding.  Do not drive or use heavy machinery while taking prescription pain medicine.  Do not drive for 24 hours if you were given a medicine to help you relax (sedative) during the procedure. Incision care   Follow instructions  from your health care provider about how to take care of your incisions. Make sure you: ? Wash your hands with soap and water before you change your bandage (dressing). If soap and water are not available, use hand sanitizer. ? Change your dressing as told by your health care provider. ? Leave stitches (sutures), skin glue, or adhesive strips in place. These skin closures may need to stay in place for 2 weeks or longer. If adhesive strip edges start to loosen and curl up, you may trim the loose edges. Do not remove adhesive strips completely unless your health care provider tells you to do that.  Check your incision area every day for signs of infection. Check for: ? Redness, swelling, or pain. ? Fluid or blood. ? Warmth. ? Pus or a bad smell. Activity  Get regular exercise as told by your health care provider. You may be told to take short walks every day and go farther each time.  Return to your normal activities as told by your health care provider. Ask your health care provider what activities are safe for you.  Do not douche, use tampons, or have   sexual intercourse for at least 6 weeks, or until your health care provider gives you permission.  Do not lift anything that is heavier than 10 lb (4.5 kg), or the limit that your health care provider tells you, until he or she says that it is safe. General instructions  Do not take baths, swim, or use a hot tub until your health care provider approves. Take showers instead of baths.  Do not drive for 24 hours if you received a sedative.  Do not drive or operate heavy machinery while taking prescription pain medicine.  To prevent or treat constipation while you are taking prescription pain medicine, your health care provider may recommend that you: ? Drink enough fluid to keep your urine clear or pale yellow. ? Take over-the-counter or prescription medicines. ? Eat foods that are high in fiber, such as fresh fruits and vegetables, whole  grains, and beans. ? Limit foods that are high in fat and processed sugars, such as fried and sweet foods.  Keep all follow-up visits as told by your health care provider. This is important. Contact a health care provider if:  You have signs of infection, such as: ? Redness, swelling, or pain around your incision sites. ? Fluid or blood coming from an incision. ? An incision that feels warm to the touch. ? Pus or a bad smell coming from an incision.  Your incision breaks open.  Your pain medicine is not helping.  You feel dizzy or light-headed.  You have pain or bleeding when you urinate.  You have persistent nausea and vomiting.  You have blood, pus, or a bad-smelling discharge from your vagina. Get help right away if:  You have a fever.  You have severe abdominal pain.  You have chest pain.  You have shortness of breath.  You faint.  You have pain, swelling, or redness in your leg.  You have heavy bleeding from your vagina. Summary  After the procedure, it is common to have abdominal pain and vaginal bleeding.  You should not drive or lift heavy objects until your health care provider says that it is safe.  Contact your health care provider if you have any symptoms of infection, excessive vaginal bleeding, nausea, vomiting, or shortness of breath. This information is not intended to replace advice given to you by your health care provider. Make sure you discuss any questions you have with your health care provider. Document Revised: 05/27/2017 Document Reviewed: 08/10/2016 Elsevier Patient Education  2020 [employer] Inc[employer]  

## 2020-03-17 NOTE — Anesthesia Postprocedure Evaluation (Signed)
Anesthesia Post Note  Patient: Erin [name]  Procedure(s) Performed: LAPAROSCOPIC ASSISTED VAGINAL HYSTERECTOMY WITH SALPINGO OOPHORECTOMY (Bilateral Vagina ) ANTERIOR (CYSTOCELE) AND POSTERIOR REPAIR (RECTOCELE) (N/A Vagina ) PUBO-VAGINAL SLING-TVT (N/A Vagina )  Patient location during evaluation: PACU Anesthesia Type: General Level of consciousness: awake and alert Pain management: pain level controlled Vital Signs Assessment: post-procedure vital signs reviewed and stable Respiratory status: spontaneous breathing and respiratory function stable Cardiovascular status: stable Anesthetic complications: no   No complications documented.   Last Vitals:  Vitals:   03/17/20 1147 03/17/20 1154  BP: 96/70 96/70  Pulse: 73 76  Resp: 13 19  Temp:    SpO2: 96% 98%    Last Pain:  Vitals:   03/17/20 1154  TempSrc:   PainSc: 9                  Dajai Wahlert K

## 2020-03-18 ENCOUNTER — Encounter: Payer: Self-pay | Admitting: Emergency Medicine

## 2020-03-18 ENCOUNTER — Other Ambulatory Visit: Payer: Self-pay | Admitting: *Deleted

## 2020-03-18 ENCOUNTER — Other Ambulatory Visit: Payer: Self-pay

## 2020-03-18 DIAGNOSIS — N812 Incomplete uterovaginal prolapse: Secondary | ICD-10-CM | POA: Diagnosis not present

## 2020-03-18 DIAGNOSIS — N9989 Other postprocedural complications and disorders of genitourinary system: Secondary | ICD-10-CM | POA: Insufficient documentation

## 2020-03-18 DIAGNOSIS — J45901 Unspecified asthma with (acute) exacerbation: Secondary | ICD-10-CM | POA: Insufficient documentation

## 2020-03-18 DIAGNOSIS — Z7901 Long term (current) use of anticoagulants: Secondary | ICD-10-CM | POA: Insufficient documentation

## 2020-03-18 DIAGNOSIS — N183 Chronic kidney disease, stage 3 unspecified: Secondary | ICD-10-CM | POA: Insufficient documentation

## 2020-03-18 DIAGNOSIS — K5900 Constipation, unspecified: Secondary | ICD-10-CM | POA: Insufficient documentation

## 2020-03-18 DIAGNOSIS — Z79899 Other long term (current) drug therapy: Secondary | ICD-10-CM | POA: Insufficient documentation

## 2020-03-18 DIAGNOSIS — I129 Hypertensive chronic kidney disease with stage 1 through stage 4 chronic kidney disease, or unspecified chronic kidney disease: Secondary | ICD-10-CM | POA: Insufficient documentation

## 2020-03-18 DIAGNOSIS — Z90710 Acquired absence of both cervix and uterus: Secondary | ICD-10-CM | POA: Insufficient documentation

## 2020-03-18 DIAGNOSIS — Z7951 Long term (current) use of inhaled steroids: Secondary | ICD-10-CM | POA: Insufficient documentation

## 2020-03-18 DIAGNOSIS — C8253 Diffuse follicle center lymphoma, intra-abdominal lymph nodes: Secondary | ICD-10-CM

## 2020-03-18 LAB — CBC WITH DIFFERENTIAL/PLATELET
Abs Immature Granulocytes: 0.03 10*3/uL (ref 0.00–0.07)
Basophils Absolute: 0 10*3/uL (ref 0.0–0.1)
Basophils Relative: 0 %
Eosinophils Absolute: 0.1 10*3/uL (ref 0.0–0.5)
Eosinophils Relative: 1 %
HCT: 30 % — ABNORMAL LOW (ref 36.0–46.0)
Hemoglobin: 9.8 g/dL — ABNORMAL LOW (ref 12.0–15.0)
Immature Granulocytes: 0 %
Lymphocytes Relative: 24 %
Lymphs Abs: 1.9 10*3/uL (ref 0.7–4.0)
MCH: 32.3 pg (ref 26.0–34.0)
MCHC: 32.7 g/dL (ref 30.0–36.0)
MCV: 99 fL (ref 80.0–100.0)
Monocytes Absolute: 0.7 10*3/uL (ref 0.1–1.0)
Monocytes Relative: 8 %
Neutro Abs: 5.4 10*3/uL (ref 1.7–7.7)
Neutrophils Relative %: 67 %
Platelets: 189 10*3/uL (ref 150–400)
RBC: 3.03 MIL/uL — ABNORMAL LOW (ref 3.87–5.11)
RDW: 14.7 % (ref 11.5–15.5)
WBC: 8 10*3/uL (ref 4.0–10.5)
nRBC: 0 % (ref 0.0–0.2)

## 2020-03-18 LAB — URINE CULTURE: Culture: NO GROWTH

## 2020-03-18 LAB — CBC
HCT: 28.7 % — ABNORMAL LOW (ref 36.0–46.0)
Hemoglobin: 9.4 g/dL — ABNORMAL LOW (ref 12.0–15.0)
MCH: 32.1 pg (ref 26.0–34.0)
MCHC: 32.8 g/dL (ref 30.0–36.0)
MCV: 98 fL (ref 80.0–100.0)
Platelets: 172 10*3/uL (ref 150–400)
RBC: 2.93 MIL/uL — ABNORMAL LOW (ref 3.87–5.11)
RDW: 14.4 % (ref 11.5–15.5)
WBC: 7.8 10*3/uL (ref 4.0–10.5)
nRBC: 0 % (ref 0.0–0.2)

## 2020-03-18 LAB — PROTIME-INR
INR: 0.9 (ref 0.8–1.2)
INR: 0.9 (ref 0.8–1.2)
Prothrombin Time: 12 seconds (ref 11.4–15.2)
Prothrombin Time: 11.9 seconds (ref 11.4–15.2)

## 2020-03-18 LAB — LACTIC ACID, PLASMA: Lactic Acid, Venous: 1.4 mmol/L (ref 0.5–1.9)

## 2020-03-18 LAB — COMPREHENSIVE METABOLIC PANEL
ALT: 10 U/L (ref 0–44)
AST: 20 U/L (ref 15–41)
Albumin: 3.5 g/dL (ref 3.5–5.0)
Alkaline Phosphatase: 80 U/L (ref 38–126)
Anion gap: 8 (ref 5–15)
BUN: 25 mg/dL — ABNORMAL HIGH (ref 8–23)
CO2: 30 mmol/L (ref 22–32)
Calcium: 8.6 mg/dL — ABNORMAL LOW (ref 8.9–10.3)
Chloride: 100 mmol/L (ref 98–111)
Creatinine, Ser: 1.73 mg/dL — ABNORMAL HIGH (ref 0.44–1.00)
GFR calc Af Amer: 34 mL/min — ABNORMAL LOW (ref >60)
GFR calc non Af Amer: 29 mL/min — ABNORMAL LOW (ref >60)
Glucose, Bld: 145 mg/dL — ABNORMAL HIGH (ref 70–99)
Potassium: 3.3 mmol/L — ABNORMAL LOW (ref 3.5–5.1)
Sodium: 138 mmol/L (ref 135–145)
Total Bilirubin: 0.5 mg/dL (ref 0.3–1.2)
Total Protein: 7 g/dL (ref 6.5–8.1)

## 2020-03-18 MED ORDER — OXYCODONE HCL 5 MG PO TABS: 5.0000 mg | ORAL_TABLET | Freq: Four times a day (QID) | ORAL | 0 refills | Status: DC | PRN

## 2020-03-18 MED ORDER — SIMETHICONE 80 MG PO CHEW: 80.0000 mg | CHEWABLE_TABLET | Freq: Four times a day (QID) | ORAL | 1 refills | Status: DC | PRN

## 2020-03-18 MED ORDER — DOCUSATE SODIUM 100 MG PO CAPS: 100.0000 mg | ORAL_CAPSULE | Freq: Two times a day (BID) | ORAL | 2 refills | Status: DC | PRN

## 2020-03-18 MED ORDER — OXYCODONE-ACETAMINOPHEN 5-325 MG PO TABS: 1.0000 | ORAL_TABLET | Freq: Four times a day (QID) | ORAL | 0 refills | Status: DC | PRN

## 2020-03-18 MED ORDER — FERROUS SULFATE 325 (65 FE) MG PO TABS: 325.0000 mg | ORAL_TABLET | Freq: Every day | ORAL | 1 refills | Status: DC

## 2020-03-18 NOTE — Progress Notes (Signed)
All discharge instructions reviewed with pt; pt already has follow up appointment; pt's family has gone to pick up a PARTIAL pain med prescription from [place] (money was donated to pt to get this partial prescription tonight due to pt's son and daughter-in-law reporting that they "didn't have any money"; pt knows to get the rest of her prescriptions from her normal pharmacy tomorrow; pt knows to call pharmacy in the morning (per pt "they'll deliver them to me when I have the money"); RN encouraged pt to call pharmacy, call her family or call Dr. Andreas Blower office with any concerns or issues with prescription meds; pt in wheelchair and RN escorted pt to medical mall entrance; pt's ride here; pt's son here to take pt home at this time

## 2020-03-18 NOTE — Progress Notes (Signed)
Post-Operative Day # 1, s/p LAVH/BSO with posterior repair, TOT sling  Subjective: tolerating PO and + flatus. Patient does report pain this morning, however pain is managed with meds. Has ambulated to chair. Not voided yet due to catheter in place.   Objective: Temp:  [97.1 F (36.2 C)-99 F (37.2 C)] 99 F (37.2 C) (09/21 0510) Pulse Rate:  [73-94] 94 (09/21 0510) Resp:  [12-23] 18 (09/21 0510) BP: (66-119)/(48-90) 118/59 (09/21 0510) SpO2:  [89 %-100 %] 99 % (09/21 0510)  I/O last 3 completed shifts: In: 3150.9 [P.O.:480; I.V.:2570.9; IV Piggyback:100] Out: 2920 [Urine:2820; Blood:100] No intake/output data recorded.    Physical Exam:  General: alert and no distress  Lungs: clear to auscultation bilaterally Heart: regular rate and rhythm, S1, S2 normal, no murmur, click, rub or gallop Abdomen: soft, non-tender; bowel sounds normal; no masses,  no organomegaly Pelvis:Bleeding: appropriate,  Incision: healing well, no significant drainage, no dehiscence, no significant erythema Extremities: DVT Evaluation: No evidence of DVT seen on physical exam. Negative Homan's sign. No cords or calf tenderness. No significant calf/ankle edema.  Recent Labs    03/17/20 0719 03/18/20 0417  HGB 11.8* 9.4*  HCT 33.8* 28.7*     Lab Results  Component Value Date   CREATININE 1.62 (H) 03/17/2020     Assessment/Plan: Doing well postoperatively. Regular diet as tolerated.  Encourage ambulation Encourage use of incentive spirometer Pain management with PO meds.  Mild anemia post-operatively, asymptomatic. Will treat with PO iron tablets. Continue foley due to TOT sling, with bladder training. Will remove by the afternoon. Discharge home later this evening likely.    Rubie Maid, MD [facility]     Rubie Maid, MD [facility]

## 2020-03-18 NOTE — ED Triage Notes (Addendum)
Pt arrived via [facility] from home with reports of hysterectomy yesterday, pt states she is having abdominal pain, not passing any gas. Pt reports she has been up walking around and had small BM today.  Pt reports she has hx of constipation.  Pt also reports having vaginal bleeding as well.   Pt also has some periods of confusion, pt did not know when she left the hospital and wasn't sure if she stayed overnight, pt was discharged from the hospital earlier this evening. When asked about the vaginal bleeding, pt stated after she got home she went to the bathroom and blood gushed out, pt states she put on a pad but states she has been over here since then and has not changed the pad.

## 2020-03-18 NOTE — Discharge Summary (Signed)
Gynecology Physician Postoperative Discharge Summary  Patient ID: Erin [name] MRN: [id number] DOB/AGE: 1945/08/25 74 y.o.  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 [id number]  admitted for scheduled surgery.  She underwent the procedures as mentioned above, her operation was uncomplicated. For further details about surgery, please refer to the operative report. Patient had an uncomplicated postoperative course. By time of discharge on POD#1, her pain was controlled on oral pain medications; she was ambulating, voiding without difficulty, tolerating regular diet and passing flatus. She was deemed stable for discharge to home.   Significant Labs: CBC Latest Ref Rng & Units 03/18/2020 03/17/2020 09/25/2019  WBC 4.0 - 10.5 K/uL 7.8 5.7 5.2  Hemoglobin 12.0 - 15.0 g/dL 9.4(L) 11.8(L) 11.9(L)  Hematocrit 36 - 46 % 28.7(L) 33.8(L) 36.0  Platelets 150 - 400 K/uL 172 177 218    Discharge Exam: Blood pressure 110/82, pulse 98, temperature 99.3 F (37.4 C), temperature source Oral, resp. rate 18, height 5\' 4"  (1.626 m), weight 83.6 kg, SpO2 92 %. General appearance: alert and no distress  Resp: clear to auscultation bilaterally  Cardio: regular rate and rhythm  GI: soft, non-tender; bowel sounds normal; no masses, no organomegaly.  Incision: C/D/I, no erythema, no drainage noted Pelvic: scant blood on pad  Extremities: extremities normal, atraumatic, no cyanosis or edema and Homans sign is negative, no sign of  DVT  Discharged Condition: Stable  Disposition: Discharge disposition: 01-Home or Self Care       Discharge Instructions    Discharge patient   Complete by: As directed    Discharge disposition: 01-Home or Self Care   Discharge patient date: 03/18/2020     Allergies as of 03/18/2020      Reactions   Penicillins Rash   Has patient had a PCN reaction causing immediate rash, facial/tongue/throat swelling, SOB or lightheadedness with hypotension: Yes Has patient had a PCN reaction causing severe rash involving mucus membranes or skin necrosis: No Has patient had a PCN reaction that required hospitalization: No Has patient had a PCN reaction occurring within the last 10 years: No If all of the above answers are "NO", then may proceed with Cephalosporin use.   Prednisone Hives   Sulfa Antibiotics Hives, Shortness Of Breath   Enalapril    Hypotension       Medication List    TAKE these medications   acetaminophen 325 MG tablet Commonly known as: TYLENOL Take 325-650 mg by mouth every 6 (six) hours as needed for moderate pain or headache.   albuterol (2.5 MG/3ML) 0.083% nebulizer solution Commonly known as: PROVENTIL Take 2.5 mg by nebulization every 6 (six) hours as needed for wheezing or shortness of breath. What changed: Another medication with the same name was changed. Make sure you understand how and when to take each.   albuterol 108 (90 Base) MCG/ACT inhaler Commonly known as: VENTOLIN HFA Inhale 1-2 puffs into the lungs every 6 (six) hours as needed for wheezing or shortness of breath. What changed:   how much to take  when to take this   alendronate 70 MG tablet Commonly known as: FOSAMAX Take 70 mg  by mouth every Friday.   aspirin 81 MG chewable tablet Chew 81 mg by mouth daily.   B-12 2500 MCG Tabs Take 2,500 mcg by mouth daily.   Baraclude 0.5 MG tablet Generic drug: entecavir Take 0.5 mg by mouth every morning.   Biotin 5000 5 MG Caps Generic  drug: Biotin Take 5 mg by mouth daily.   CALCIUM 600 + D PO Take 1 tablet by mouth daily.   citalopram 40 MG tablet Commonly known as: CELEXA Take 40 mg by mouth every morning.   diphenhydrAMINE 25 MG tablet Commonly known as: BENADRYL Take 25 mg by mouth at bedtime.   docusate sodium 100 MG capsule Commonly known as: COLACE Take 1 capsule (100 mg total) by mouth 2 (two) times daily as needed.   ferrous sulfate 325 (65 FE) MG tablet Commonly known as: FerrouSul Take 1 tablet (325 mg total) by mouth daily with breakfast.   furosemide 20 MG tablet Commonly known as: LASIX Take 20 mg by mouth daily.   gabapentin 600 MG tablet Commonly known as: NEURONTIN Take 600 mg by mouth 3 (three) times daily.   Iron 27 240 (27 FE) MG tablet Generic drug: ferrous gluconate Take 240 mg by mouth daily.   LUBRICANT EYE DROPS OP Place 1 drop into both eyes daily as needed (dry eyes).   mometasone-formoterol 200-5 MCG/ACT Aero Commonly known as: Dulera Inhale 2 puffs into the lungs 2 (two) times daily.   multivitamin with minerals Tabs tablet Take 1 tablet by mouth daily.   omeprazole 20 MG capsule Commonly known as: PRILOSEC Take 20 mg by mouth 2 (two) times daily.   oxyCODONE 5 MG immediate release tablet Commonly known as: Roxicodone Take 1 tablet (5 mg total) by mouth every 6 (six) hours as needed for moderate pain or severe pain (may take up to 2 tablets for severe pain).   simethicone 80 MG chewable tablet Commonly known as: Gas-X Chew 1 tablet (80 mg total) by mouth 4 (four) times daily as needed for flatulence.   tiZANidine 2 MG tablet Commonly known as: ZANAFLEX Take 2 mg by mouth 3 (three) times daily as needed for muscle spasms.   warfarin 1 MG tablet Commonly known as: COUMADIN TAKE ONE TABLET BY MOUTH EVERY DAY AT 6PM What changed: See the new instructions.      Future Appointments  Date Time Provider [place]  03/25/2020  1:00 PM [facility]  [facility] None  03/25/2020  1:30 PM Cammie Sickle, MD [facility] None  04/01/2020  4:15 PM Rubie Maid, MD [facility] None    Follow-up Information    Rubie Maid, MD. Go on 04/11/2020.   Specialties: Obstetrics and Gynecology, Radiology Why: post-operative visit @ 4:15 pm Contact information: [place] [id number] [phone]               Total discharge time: 15 minutes   Signed: Rubie Maid, MD [facility]

## 2020-03-18 NOTE — Progress Notes (Signed)
Checked in with patient to review Code 64 that was signed/reviewed yesterday. Patient remembered signing this and verbalized understanding. Provided printed copy to patient.   Patient to discharge home today per RN. Asked patient if she has any needs, she denied.   Please consult TOC if any needs arise prior to discharge.  Oleh Genin, [place]

## 2020-03-19 ENCOUNTER — Emergency Department
Admission: EM | Admit: 2020-03-19 | Discharge: 2020-03-19 | Disposition: A | Discharge status: Home / Self Care | Payer: Medicare Other | Source: Home / Self Care | Attending: Emergency Medicine | Admitting: Emergency Medicine

## 2020-03-19 ENCOUNTER — Emergency Department: Payer: Medicare Other

## 2020-03-19 DIAGNOSIS — N99820 Postprocedural hemorrhage and hematoma of a genitourinary system organ or structure following a genitourinary system procedure: Secondary | ICD-10-CM

## 2020-03-19 DIAGNOSIS — N812 Incomplete uterovaginal prolapse: Secondary | ICD-10-CM | POA: Diagnosis not present

## 2020-03-19 LAB — URINALYSIS, COMPLETE (UACMP) WITH MICROSCOPIC
Bacteria, UA: NONE SEEN
Bilirubin Urine: NEGATIVE
Glucose, UA: NEGATIVE mg/dL
Ketones, ur: NEGATIVE mg/dL
Nitrite: NEGATIVE
Protein, ur: NEGATIVE mg/dL
Specific Gravity, Urine: 1.005 (ref 1.005–1.030)
Squamous Epithelial / HPF: NONE SEEN (ref 0–5)
pH: 6 (ref 5.0–8.0)

## 2020-03-19 LAB — HEMOGLOBIN AND HEMATOCRIT, BLOOD
HCT: 27.1 % — ABNORMAL LOW (ref 36.0–46.0)
Hemoglobin: 9.2 g/dL — ABNORMAL LOW (ref 12.0–15.0)

## 2020-03-19 LAB — SURGICAL PATHOLOGY

## 2020-03-19 NOTE — TOC Transition Note (Signed)
Transition of Care [facility]) - CM/SW Discharge Note   Patient Details  Name: THURMA PRIEGO MRN: [id number] Date of Birth: Mar 26, 1946  Transition of [facility]) CM/SW Contact:  Ova Freshwater Phone Number: [phone] 03/19/2020, 1:06 PM   Clinical Narrative:     Patient d/c home w/ [facility].  CSW called patient's son Audley Hose (Son) [phone] and  Relative Sabino Snipes to update on disposition and left a voicemail for both.  TOC consult complete.        Patient Goals and CMS Choice        Discharge Placement                       Discharge Plan and Services                                     Social Determinants of Health (SDOH) Interventions     Readmission Risk Interventions No flowsheet data found.

## 2020-03-19 NOTE — ED Notes (Signed)
Pt walked to uber by this Therapist, sports. Pt's uber ordered through [facility]. Pt alert and in NAD at time of departure. E-signature not working at this time. Pt verbalized understanding of D/C instructions, prescriptions and follow up care with no further questions at this time. Pt in NAD and ambulatory at time of D/C.

## 2020-03-19 NOTE — ED Provider Notes (Signed)
[facility] Emergency Department Provider Note  ____________________________________________   First MD Initiated Contact with Patient 03/19/20 [phone]     (approximate)  I have reviewed the triage vital signs and the nursing notes.   HISTORY  Chief Complaint Abdominal Pain and Post-op Problem (Hysterectomy on 9/20)   HPI Erin [name] is a 74 y.o. female with below list of previous medical conditions including recent laparoscopic assisted vaginal hysterectomy by Dr. Marcelline Mates 03/17/2020 presents to the emergency department secondary to vaginal bleeding which patient states occurred this evening she describes that as a puddle of blood".  Patient states that she since put on a pad and that bleeding has decreased.  Patient also admits to constipation.  Patient currently denies any abdominal pain.  Patient denies any fever.  Of note the patient is anticoagulated on warfarin.        Past Medical History:  Diagnosis Date  . Acute URI   . Allergy   . Anxiety   . Asthma    well controlled  . Cancer ([place])    lymphoma in remission  . Diverticulosis   . DVT of upper extremity (deep vein thrombosis) ([place]) 07/2008 & 12/2007  . Gastric ulcer 10/2007   EGD  . GERD (gastroesophageal reflux disease)   . Hepatitis B    treated  . History of hiatal hernia   . Hypertension   . Laboratory confirmed diagnosis of COVID-19 06/27/2019  . Lower extremity edema   . Lymphoma ([place])    Stage 3 large B cell lymphoma    Patient Active Problem List   Diagnosis Date Noted  . 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 01/17/2020  . Urge incontinence 01/17/2020  . Pneumonia due to COVID-19 virus 06/27/2019  . Respiratory failure with hypoxia ([place]) 06/27/2019  . Chronic anticoagulation 06/27/2019  . CKD (chronic kidney disease) stage 3, GFR 30-59 ml/min 06/27/2019  . Sepsis ([place]) 10/01/2018   . Asthma exacerbation 10/05/2017  . 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 Disease of liver 12/18/2014  . Acute URI 11/07/2014  . BP (high blood pressure) 05/09/2014  . Chronic hepatitis B virus infection ([place]) 03/27/2011  . Lymphoma ([place]) 10/27/2007    Past Surgical History:  Procedure Laterality Date  . ANTERIOR AND POSTERIOR REPAIR N/A 03/17/2020   Procedure: ANTERIOR (CYSTOCELE) AND POSTERIOR REPAIR (RECTOCELE);  Surgeon: Rubie Maid, MD;  Location: [facility] ORS;  Service: Gynecology;  Laterality: N/A;  . CHOLECYSTECTOMY    . COLONOSCOPY  11/23/2007, 05/12/1994  . COLONOSCOPY WITH PROPOFOL N/A 03/31/2018   Procedure: COLONOSCOPY WITH PROPOFOL;  Surgeon: Manya Silvas, MD;  Location: [facility] ENDOSCOPY;  Service: Endoscopy;  Laterality: N/A;  . ESOPHAGOGASTRODUODENOSCOPY  09/03/2009, 10/14/2008, 09/30/2008, 11/23/2007  . HEMORRHOID SURGERY    . LAPAROSCOPIC VAGINAL HYSTERECTOMY WITH SALPINGO OOPHORECTOMY Bilateral 03/17/2020   Procedure: LAPAROSCOPIC ASSISTED VAGINAL HYSTERECTOMY WITH SALPINGO OOPHORECTOMY;  Surgeon: Rubie Maid, MD;  Location: [facility] ORS;  Service: Gynecology;  Laterality: Bilateral;  . LIVER BIOPSY  01/27/1996  . PORTOCAVAL SHUNT PLACEMENT     and removal  . PUBOVAGINAL SLING N/A 03/17/2020   Procedure: PUBO-VAGINAL SLING-TVT;  Surgeon: Rubie Maid, MD;  Location: [facility] ORS;  Service: Gynecology;  Laterality: N/A;    Prior to Admission medications   Medication Sig Start Date End Date Taking? Authorizing Provider  acetaminophen (TYLENOL) 325 MG tablet Take 325-650 mg by mouth every 6 (six) hours as  needed for moderate pain or headache.   Yes [provider]  albuterol (PROVENTIL HFA;VENTOLIN HFA) 108 (90 Base) MCG/ACT inhaler Inhale 1-2 puffs into the lungs every 6 (six) hours as needed for wheezing or shortness of breath. Patient taking differently: Inhale 2 puffs into the lungs every 4 (four) hours as needed for  wheezing or shortness of breath.  10/08/17  Yes Salary, Holly Bodily D, MD  albuterol (PROVENTIL) (2.5 MG/3ML) 0.083% nebulizer solution Take 2.5 mg by nebulization every 6 (six) hours as needed for wheezing or shortness of breath.   Yes [provider]  alendronate (FOSAMAX) 70 MG tablet Take 70 mg by mouth every Friday.  03/26/15  Yes [provider]  aspirin 81 MG chewable tablet Chew 81 mg by mouth daily.   Yes [provider]  Biotin (BIOTIN 5000) 5 MG CAPS Take 5 mg by mouth daily.   Yes [provider]  Calcium Carb-Cholecalciferol (CALCIUM 600 + D PO) Take 1 tablet by mouth daily.   Yes [provider]  Carboxymethylcellulose Sodium (LUBRICANT EYE DROPS OP) Place 1 drop into both eyes daily as needed (dry eyes).   Yes [provider]  citalopram (CELEXA) 40 MG tablet Take 40 mg by mouth every morning.  04/30/19  Yes [provider]  Cyanocobalamin (B-12) 2500 MCG TABS Take 2,500 mcg by mouth daily.   Yes [provider]  diphenhydrAMINE (BENADRYL) 25 MG tablet Take 25 mg by mouth at bedtime.    Yes [provider]  docusate sodium (COLACE) 100 MG capsule Take 1 capsule (100 mg total) by mouth 2 (two) times daily as needed. 03/18/20  Yes Rubie Maid, MD  entecavir (BARACLUDE) 0.5 MG tablet Take 0.5 mg by mouth every morning.    Yes [provider]  ferrous gluconate (IRON 27) 240 (27 FE) MG tablet Take 240 mg by mouth daily.   Yes [provider]  ferrous sulfate (FERROUSUL) 325 (65 FE) MG tablet Take 1 tablet (325 mg total) by mouth daily with breakfast. 03/18/20  Yes Rubie Maid, MD  furosemide (LASIX) 20 MG tablet Take 20 mg by mouth daily. 07/22/18  Yes [provider]  gabapentin (NEURONTIN) 600 MG tablet Take 600 mg by mouth 3 (three) times daily.  12/26/18  Yes [provider]  mometasone-formoterol (DULERA) 200-5 MCG/ACT AERO Inhale 2 puffs into the lungs 2 (two) times daily.  10/05/18  Yes Gladstone Lighter, MD  Multiple Vitamin (MULTIVITAMIN WITH MINERALS) TABS tablet Take 1 tablet by mouth daily.   Yes [provider]  omeprazole (PRILOSEC) 20 MG capsule Take 20 mg by mouth 2 (two) times daily.    Yes [provider]  oxyCODONE (ROXICODONE) 5 MG immediate release tablet Take 1 tablet (5 mg total) by mouth every 6 (six) hours as needed for moderate pain or severe pain (may take up to 2 tablets for severe pain). 03/18/20 03/18/21 Yes Rubie Maid, MD  oxyCODONE-acetaminophen (PERCOCET) 5-325 MG tablet Take 1-2 tablets by mouth every 6 (six) hours as needed for severe pain. 03/18/20 03/18/21 Yes Rubie Maid, MD  simethicone (GAS-X) 80 MG chewable tablet Chew 1 tablet (80 mg total) by mouth 4 (four) times daily as needed for flatulence. 03/18/20 03/18/21 Yes Rubie Maid, MD  tiZANidine (ZANAFLEX) 2 MG tablet Take 2 mg by mouth 3 (three) times daily as needed for muscle spasms.    Yes [provider]  warfarin (COUMADIN) 1 MG tablet TAKE ONE TABLET BY MOUTH EVERY DAY AT [facility]  Patient taking differently: Take 1 mg by mouth daily.  06/29/17  Yes Cammie Sickle, MD    Allergies Penicillins, Prednisone, Sulfa antibiotics, and Enalapril  Family History  Problem Relation Age of Onset  . Cancer Daughter   . Asthma Mother   . Breast cancer Neg Hx     Social History Social History   Tobacco Use  . Smoking status: Never Smoker  . Smokeless tobacco: Never Used  Vaping Use  . Vaping Use: Never used  Substance Use Topics  . Alcohol use: No  . Drug use: Never    Review of Systems Constitutional: No fever/chills Eyes: No visual changes. ENT: No sore throat. Cardiovascular: Denies chest pain. Respiratory: Denies shortness of breath. Gastrointestinal: No abdominal pain.  No nausea, no vomiting.  No diarrhea.  No constipation. Genitourinary: Negative for dysuria.  Positive for vaginal bleeding Musculoskeletal: Negative for neck pain.   Negative for back pain. Integumentary: Negative for rash. Neurological: Negative for headaches, focal weakness or numbness.  ____________________________________________   PHYSICAL EXAM:  VITAL SIGNS: ED Triage Vitals  Enc Vitals Group     BP 03/18/20 2220 (!) 99/50     Pulse Rate 03/18/20 2220 89     Resp 03/18/20 2220 18     Temp 03/18/20 2220 98.8 F (37.1 C)     Temp Source 03/18/20 2220 Oral     SpO2 03/18/20 2220 93 %     Weight 03/18/20 2232 83.6 kg (184 lb 4.9 oz)     Height 03/18/20 2232 1.626 m (5\' 4" )     Head Circumference --      Peak Flow --      Pain Score 03/18/20 2231 8     Pain Loc --      Pain Edu? --      Excl. in [place]? --     Constitutional: Alert and oriented to self place and situation however disoriented to year patient states that it is 1947.  Eyes: Conjunctivae are normal.  Head: Atraumatic. Mouth/Throat: Patient is wearing a mask. Neck: No stridor.  No meningeal signs.   Cardiovascular: Normal rate, regular rhythm. Good peripheral circulation. Grossly normal heart sounds. Respiratory: Normal respiratory effort.  No retractions. Gastrointestinal: Soft and nontender. No distention.  Genitourinary: Scant vaginal bleeding noted Musculoskeletal: No lower extremity tenderness nor edema. No gross deformities of extremities. Neurologic:  Normal speech and language. No gross focal neurologic deficits are appreciated.  Skin:  Skin is warm, dry and intact. Psychiatric: Mood and affect are normal. Speech and behavior are normal.  ____________________________________________   LABS (all labs ordered are listed, but only abnormal results are displayed)  Labs Reviewed  COMPREHENSIVE METABOLIC PANEL - Abnormal; Notable for the following components:      Result Value   Potassium 3.3 (*)    Glucose, Bld 145 (*)    BUN 25 (*)    Creatinine, Ser 1.73 (*)    Calcium 8.6 (*)    GFR calc non Af Amer 29 (*)    GFR calc Af Amer 34 (*)    All other components  within normal limits  CBC WITH DIFFERENTIAL/PLATELET - Abnormal; Notable for the following components:   RBC 3.03 (*)    Hemoglobin 9.8 (*)    HCT 30.0 (*)    All other components within normal limits  CULTURE, BLOOD (ROUTINE X 2)  CULTURE, BLOOD (ROUTINE X 2)  LACTIC ACID, PLASMA  PROTIME-INR  LACTIC ACID, PLASMA  URINALYSIS, COMPLETE (UACMP) WITH MICROSCOPIC  HEMOGLOBIN AND HEMATOCRIT, BLOOD  TYPE AND SCREEN   ____________________________________  RADIOLOGY I, [place], personally viewed and evaluated these images (plain radiographs) as part of my medical decision making, as well as reviewing the written report by the radiologist.  ED MD interpretation: Unremarkable postsurgical examination of the pelvis on ultrasound per radiologist.  Official radiology report(s): US Pelvis Complete  Result Date: 03/19/2020 CLINICAL DATA:  Vaginal bleeding. Status post hysterectomy and bilateral oophorectomy. EXAM: TRANSABDOMINAL ULTRASOUND OF PELVIS TECHNIQUE: Transabdominal ultrasound examination of the pelvis was performed including evaluation of the uterus, ovaries, adnexal regions, and pelvic cul-de-sac. Transvaginal sonography was deferred by the patient. COMPARISON:  None. FINDINGS: Uterus Absent Endometrium Not applicable Right ovary Absent.  No adnexal masses visualized Left ovary Absent.  No adnexal masses visualized Other findings:  No abnormal free fluid. IMPRESSION: Unremarkable postsurgical examination of the pelvis. Electronically Signed   By: Fidela Salisbury MD   On: 03/19/2020 06:00    ____________________________________________   PROCEDURES   Procedure(s) performed (including Critical Care):  Procedures   ____________________________________________   INITIAL IMPRESSION / MDM / [place] / ED COURSE  As part of my medical decision making, I reviewed the following data within the electronic MEDICAL RECORD NUMBER  74 year old female presented with  above-stated history and physical exam following laparoscopic hysterectomy with vaginal bleeding and at present.  Laboratory data revealed a hemoglobin which is stable in comparison to preoperative hemoglobin.  Pelvic ultrasound revealed no acute abnormality.  Patient discussed with Dr. Marcelline Mates OB/GYN who will evaluate the patient in the emergency department.  Concern for regarding the patient's confusion is a patient does live alone as per record. ____________________________________________  FINAL CLINICAL IMPRESSION(S) / ED DIAGNOSES  Final diagnoses:  Postoperative vaginal bleeding following genitourinary procedure     MEDICATIONS GIVEN DURING THIS VISIT:  Medications - No data to display   ED Discharge Orders    None      *Please note:  VEGA STARE was evaluated in Emergency Department on 03/19/2020 for the symptoms described in the history of present illness. She was evaluated in the context of the global COVID-19 pandemic, which necessitated consideration that the patient might be at risk for infection with the SARS-CoV-2 virus that causes COVID-19. Institutional protocols and algorithms that pertain to the evaluation of patients at risk for COVID-19 are in a state of rapid change based on information released by regulatory bodies including the CDC and federal and state organizations. These policies and algorithms were followed during the patient's care in the ED.  Some ED evaluations and interventions may be delayed as a result of limited staffing during and after the pandemic.*  Note:  This document was prepared using Dragon voice recognition software and may include unintentional dictation errors.   Gregor Hams, MD 03/20/20 [phone]

## 2020-03-20 ENCOUNTER — Telehealth: Payer: Self-pay

## 2020-03-20 ENCOUNTER — Other Ambulatory Visit: Payer: Self-pay

## 2020-03-20 ENCOUNTER — Emergency Department
Admission: EM | Admit: 2020-03-20 | Discharge: 2020-03-20 | Disposition: A | Discharge status: Home / Self Care | Payer: Medicare Other | Attending: Emergency Medicine | Admitting: Emergency Medicine

## 2020-03-20 DIAGNOSIS — I129 Hypertensive chronic kidney disease with stage 1 through stage 4 chronic kidney disease, or unspecified chronic kidney disease: Secondary | ICD-10-CM | POA: Diagnosis not present

## 2020-03-20 DIAGNOSIS — Z7982 Long term (current) use of aspirin: Secondary | ICD-10-CM | POA: Insufficient documentation

## 2020-03-20 DIAGNOSIS — N99820 Postprocedural hemorrhage and hematoma of a genitourinary system organ or structure following a genitourinary system procedure: Secondary | ICD-10-CM | POA: Diagnosis not present

## 2020-03-20 DIAGNOSIS — Z90722 Acquired absence of ovaries, bilateral: Secondary | ICD-10-CM | POA: Insufficient documentation

## 2020-03-20 DIAGNOSIS — J45909 Unspecified asthma, uncomplicated: Secondary | ICD-10-CM | POA: Diagnosis not present

## 2020-03-20 DIAGNOSIS — Z79899 Other long term (current) drug therapy: Secondary | ICD-10-CM | POA: Insufficient documentation

## 2020-03-20 DIAGNOSIS — N183 Chronic kidney disease, stage 3 unspecified: Secondary | ICD-10-CM | POA: Diagnosis not present

## 2020-03-20 DIAGNOSIS — R1030 Lower abdominal pain, unspecified: Secondary | ICD-10-CM | POA: Insufficient documentation

## 2020-03-20 DIAGNOSIS — N9989 Other postprocedural complications and disorders of genitourinary system: Secondary | ICD-10-CM | POA: Insufficient documentation

## 2020-03-20 DIAGNOSIS — T8132XA Disruption of internal operation (surgical) wound, not elsewhere classified, initial encounter: Secondary | ICD-10-CM | POA: Diagnosis not present

## 2020-03-20 DIAGNOSIS — Z7951 Long term (current) use of inhaled steroids: Secondary | ICD-10-CM | POA: Diagnosis not present

## 2020-03-20 LAB — COMPREHENSIVE METABOLIC PANEL
ALT: 11 U/L (ref 0–44)
AST: 25 U/L (ref 15–41)
Albumin: 3.5 g/dL (ref 3.5–5.0)
Alkaline Phosphatase: 77 U/L (ref 38–126)
Anion gap: 14 (ref 5–15)
BUN: 18 mg/dL (ref 8–23)
CO2: 29 mmol/L (ref 22–32)
Calcium: 8.7 mg/dL — ABNORMAL LOW (ref 8.9–10.3)
Chloride: 98 mmol/L (ref 98–111)
Creatinine, Ser: 1.4 mg/dL — ABNORMAL HIGH (ref 0.44–1.00)
GFR calc Af Amer: 43 mL/min — ABNORMAL LOW (ref >60)
GFR calc non Af Amer: 37 mL/min — ABNORMAL LOW (ref >60)
Glucose, Bld: 111 mg/dL — ABNORMAL HIGH (ref 70–99)
Potassium: 3.1 mmol/L — ABNORMAL LOW (ref 3.5–5.1)
Sodium: 141 mmol/L (ref 135–145)
Total Bilirubin: 0.6 mg/dL (ref 0.3–1.2)
Total Protein: 6.9 g/dL (ref 6.5–8.1)

## 2020-03-20 LAB — CBC
HCT: 29.2 % — ABNORMAL LOW (ref 36.0–46.0)
Hemoglobin: 9.6 g/dL — ABNORMAL LOW (ref 12.0–15.0)
MCH: 32.4 pg (ref 26.0–34.0)
MCHC: 32.9 g/dL (ref 30.0–36.0)
MCV: 98.6 fL (ref 80.0–100.0)
Platelets: 202 10*3/uL (ref 150–400)
RBC: 2.96 MIL/uL — ABNORMAL LOW (ref 3.87–5.11)
RDW: 14.4 % (ref 11.5–15.5)
WBC: 4 10*3/uL (ref 4.0–10.5)
nRBC: 0 % (ref 0.0–0.2)

## 2020-03-20 LAB — TYPE AND SCREEN
ABO/RH(D): O POS
Antibody Screen: NEGATIVE

## 2020-03-20 NOTE — ED Notes (Signed)
Pt walked to bathroom w steady gait. Small amount of bright red blood noted on 1/3 of pad, not soaking through. Pt given warm blanket.

## 2020-03-20 NOTE — Telephone Encounter (Signed)
Pt called no answer LM via VM to call the office. Informed pt via Vm that I was calling to check on her after her surgery.

## 2020-03-20 NOTE — ED Triage Notes (Signed)
Pt here with vaginal and rectal bleeding. Pt also having abd cramps. Pt denies nausea, vomitting, or diarrhea. Pt NAD in triage.

## 2020-03-20 NOTE — ED Notes (Signed)
First Nurse Note: Pt to ED via [facility] from home for vaginal bleeding. Pt is 3 days s/p hysterectomy. Pt states that she has soaked through 2 pads since last night and is c/o 6/10 vaginal pain. Pt is in NAD.   BP: 117/71 per EMS

## 2020-03-20 NOTE — ED Notes (Signed)
Assisted patient to commode without difficulty. No blood noted in toilet, scant blood on pt pad.

## 2020-03-20 NOTE — Discharge Instructions (Addendum)
Follow-up with Dr. Marcelline Mates as scheduled.  Return to the ER for new, worsening, or recurrent bleeding, passing clots, abdominal or pelvic pain, weakness or lightheadedness, or any other new or worsening symptoms that concern you.

## 2020-03-20 NOTE — ED Provider Notes (Signed)
[facility] Emergency Department Provider Note ____________________________________________   First MD Initiated Contact with Patient 03/20/20 1804     (approximate)  I have reviewed the triage vital signs and the nursing notes.   HISTORY  Chief Complaint Abdominal Pain and Rectal Bleeding    HPI Erin [name] is a 74 y.o. female with PMH as noted below and status post hysterectomy and salpingo-oophorectomy on 9/20 who presents with vaginal bleeding, acute onset today, persistent course, and associated with some lower abdominal crampy pain.  The patient states that she was straining to have a bowel movement and then passed a clot.  Since then she has had a smaller amount of bleeding.  She denies any other abnormal bleeding, although states that initially she was unsure whether the blood came from her vagina or her rectum.  Past Medical History:  Diagnosis Date  . Acute URI   . Allergy   . Anxiety   . Asthma    well controlled  . Cancer ([place])    lymphoma in remission  . Diverticulosis   . DVT of upper extremity (deep vein thrombosis) ([place]) 07/2008 & 12/2007  . Gastric ulcer 10/2007   EGD  . GERD (gastroesophageal reflux disease)   . Hepatitis B    treated  . History of hiatal hernia   . Hypertension   . Laboratory confirmed diagnosis of COVID-19 06/27/2019  . Lower extremity edema   . 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 01/17/2020  . Urge incontinence 01/17/2020  . Pneumonia due to COVID-19 virus 06/27/2019  . Respiratory failure with hypoxia ([place]) 06/27/2019  . Chronic anticoagulation 06/27/2019  . CKD (chronic kidney disease) stage 3, GFR 30-59 ml/min 06/27/2019  . Sepsis ([place]) 10/01/2018  . Asthma exacerbation 10/05/2017  . 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 Disease of liver 12/18/2014  . Acute URI 11/07/2014  . BP (high blood pressure) 05/09/2014  . Chronic hepatitis B virus infection ([place]) 03/27/2011  . Lymphoma ([place]) 10/27/2007    Past Surgical History:  Procedure Laterality Date  . ANTERIOR AND POSTERIOR REPAIR N/A 03/17/2020   Procedure: ANTERIOR (CYSTOCELE) AND POSTERIOR REPAIR (RECTOCELE);  Surgeon: Rubie Maid, MD;  Location: [facility] ORS;  Service: Gynecology;  Laterality: N/A;  . CHOLECYSTECTOMY    . COLONOSCOPY  11/23/2007, 05/12/1994  . COLONOSCOPY WITH PROPOFOL N/A 03/31/2018   Procedure: COLONOSCOPY WITH PROPOFOL;  Surgeon: Manya Silvas, MD;  Location[facility] ENDOSCOPY;  Service: Endoscopy;  Laterality: N/A;  . ESOPHAGOGASTRODUODENOSCOPY  09/03/2009, 10/14/2008, 09/30/2008, 11/23/2007  . HEMORRHOID SURGERY    . LAPAROSCOPIC VAGINAL HYSTERECTOMY WITH SALPINGO OOPHORECTOMY Bilateral 03/17/2020   Procedure: LAPAROSCOPIC ASSISTED VAGINAL HYSTERECTOMY WITH SALPINGO OOPHORECTOMY;  Surgeon: Rubie Maid, MD;  Location: [facility] ORS;  Service: Gynecology;  Laterality: Bilateral;  . LIVER BIOPSY  01/27/1996  . PORTOCAVAL SHUNT PLACEMENT     and removal  . PUBOVAGINAL SLING N/A 03/17/2020   Procedure: PUBO-VAGINAL SLING-TVT;  Surgeon: Rubie Maid, MD;  Location: [facility] ORS;  Service: Gynecology;  Laterality: N/A;    Prior to Admission medications   Medication Sig Start Date End Date Taking? Authorizing Provider  acetaminophen (TYLENOL) 325 MG tablet Take 325-650 mg by mouth every 6 (six) hours as needed for moderate pain or headache.  [provider]  albuterol (PROVENTIL HFA;VENTOLIN HFA) 108 (90 Base) MCG/ACT inhaler Inhale 1-2 puffs into the lungs every 6 (six) hours as needed for wheezing or shortness of breath. Patient taking differently: Inhale 2 puffs into the lungs every 4 (four) hours as needed for wheezing or shortness of breath.  10/08/17   Salary, Avel Peace, MD   albuterol (PROVENTIL) (2.5 MG/3ML) 0.083% nebulizer solution Take 2.5 mg by nebulization every 6 (six) hours as needed for wheezing or shortness of breath.    [provider]  alendronate (FOSAMAX) 70 MG tablet Take 70 mg by mouth every Friday.  03/26/15   [provider]  aspirin 81 MG chewable tablet Chew 81 mg by mouth daily.    [provider]  Biotin (BIOTIN 5000) 5 MG CAPS Take 5 mg by mouth daily.    [provider]  Calcium Carb-Cholecalciferol (CALCIUM 600 + D PO) Take 1 tablet by mouth daily.    [provider]  Carboxymethylcellulose Sodium (LUBRICANT EYE DROPS OP) Place 1 drop into both eyes daily as needed (dry eyes).    [provider]  citalopram (CELEXA) 40 MG tablet Take 40 mg by mouth every morning.  04/30/19   [provider]  Cyanocobalamin (B-12) 2500 MCG TABS Take 2,500 mcg by mouth daily.    [provider]  diphenhydrAMINE (BENADRYL) 25 MG tablet Take 25 mg by mouth at bedtime.     [provider]  docusate sodium (COLACE) 100 MG capsule Take 1 capsule (100 mg total) by mouth 2 (two) times daily as needed. 03/18/20   Rubie Maid, MD  entecavir (BARACLUDE) 0.5 MG tablet Take 0.5 mg by mouth every morning.     [provider]  ferrous gluconate (IRON 27) 240 (27 FE) MG tablet Take 240 mg by mouth daily.    [provider]  ferrous sulfate (FERROUSUL) 325 (65 FE) MG tablet Take 1 tablet (325 mg total) by mouth daily with breakfast. 03/18/20   Rubie Maid, MD  furosemide (LASIX) 20 MG tablet Take 20 mg by mouth daily. 07/22/18   [provider]  gabapentin (NEURONTIN) 600 MG tablet Take 600 mg by mouth 3 (three) times daily.  12/26/18   [provider]  mometasone-formoterol (DULERA) 200-5 MCG/ACT AERO Inhale 2 puffs into the lungs 2 (two) times daily. 10/05/18   Gladstone Lighter, MD  Multiple Vitamin (MULTIVITAMIN WITH MINERALS) TABS tablet Take 1 tablet by mouth  daily.    [provider]  omeprazole (PRILOSEC) 20 MG capsule Take 20 mg by mouth 2 (two) times daily.     [provider]  oxyCODONE (ROXICODONE) 5 MG immediate release tablet Take 1 tablet (5 mg total) by mouth every 6 (six) hours as needed for moderate pain or severe pain (may take up to 2 tablets for severe pain). 03/18/20 03/18/21  Rubie Maid, MD  oxyCODONE-acetaminophen (PERCOCET) 5-325 MG tablet Take 1-2 tablets by mouth every 6 (six) hours as needed for severe pain. 03/18/20 03/18/21  Rubie Maid, MD  simethicone (GAS-X) 80 MG chewable tablet Chew 1 tablet (80 mg total) by mouth 4 (four) times daily as needed for flatulence. 03/18/20 03/18/21  Rubie Maid, MD  tiZANidine (ZANAFLEX) 2 MG tablet Take 2 mg by mouth 3 (three) times daily as needed for muscle spasms.     [provider]  warfarin (COUMADIN) 1 MG tablet TAKE ONE TABLET BY MOUTH EVERY DAY AT 6PM Patient taking differently: Take 1 mg by mouth daily.  06/29/17   Cammie Sickle, MD    Allergies Penicillins, Prednisone, Sulfa antibiotics, and Enalapril  Family History  Problem Relation Age of Onset  . Cancer Daughter   . Asthma Mother   . Breast cancer Neg Hx     Social History Social History   Tobacco Use  . Smoking status: Never Smoker  . Smokeless tobacco: Never Used  Vaping Use  . Vaping Use: Never used  Substance Use Topics  . Alcohol use: No  . Drug use: Never    Review of Systems  Constitutional: No fever/chills. Eyes: No visual changes. ENT: No sore throat. Cardiovascular: Denies chest pain. Respiratory: Denies shortness of breath. Gastrointestinal: No vomiting or diarrhea.  Genitourinary: Negative for dysuria.  Positive for vaginal bleeding. Musculoskeletal: Negative for back pain. Skin: Negative for rash. Neurological: Negative for headache.   ____________________________________________   PHYSICAL EXAM:  VITAL SIGNS: ED Triage Vitals  Enc Vitals Group      BP 03/20/20 1532 (!) 102/55     Pulse Rate 03/20/20 1532 91     Resp 03/20/20 1532 18     Temp 03/20/20 1532 98.7 F (37.1 C)     Temp Source 03/20/20 1532 Oral     SpO2 03/20/20 1532 97 %     Weight 03/20/20 1533 180 lb (81.6 kg)     Height 03/20/20 1533 5\' 4"  (1.626 m)     Head Circumference --      Peak Flow --      Pain Score 03/20/20 1532 5     Pain Loc --      Pain Edu? --      Excl. in [place]? --     Constitutional: Alert and oriented. Well appearing and in no acute distress. Eyes: Conjunctivae are normal.  Head: Atraumatic. Nose: No congestion/rhinnorhea. Mouth/Throat: Mucous membranes are moist.   Neck: Normal range of motion.  Cardiovascular: Normal rate, regular rhythm.  Good peripheral circulation. Respiratory: Normal respiratory effort.  No retractions.  Gastrointestinal: Soft with mild bilateral lower quadrant discomfort to palpation but no focal tenderness or peritoneal signs.  No distention.  Genitourinary: Normal external genitalia.  Small but steady oozing of blood from the vaginal vault.  No visible dehiscence or abnormal tissue.  No anorectal bleeding. Musculoskeletal: No lower extremity edema.  Extremities warm and well perfused.  Neurologic:  Normal speech and language. No gross focal neurologic deficits are appreciated.  Skin:  Skin is warm and dry. No rash noted. Psychiatric: Mood and affect are normal. Speech and behavior are normal.  ____________________________________________   LABS (all labs ordered are listed, but only abnormal results are displayed)  Labs Reviewed  COMPREHENSIVE METABOLIC PANEL - Abnormal; Notable for the following components:      Result Value   Potassium 3.1 (*)    Glucose, Bld 111 (*)    Creatinine, Ser 1.40 (*)    Calcium 8.7 (*)    GFR calc non Af Amer 37 (*)    GFR calc Af Amer 43 (*)    All other components within normal limits  CBC - Abnormal; Notable for the following components:   RBC 2.96 (*)    Hemoglobin 9.6  (*)    HCT 29.2 (*)    All other components within normal limits  POC OCCULT BLOOD, ED  TYPE AND SCREEN  TYPE AND SCREEN   ____________________________________________  EKG   ____________________________________________  RADIOLOGY    ____________________________________________   PROCEDURES  Procedure(s) performed: No  Procedures  Critical Care performed: No ____________________________________________   INITIAL IMPRESSION / ASSESSMENT AND PLAN / ED COURSE  Pertinent labs & imaging results that were available during my care of the patient were reviewed by me and considered in my medical decision making (see chart for details).  74 year old female status post hysterectomy and salpingo-oophorectomy 3 days ago presents with recurrent bleeding.  I reviewed the past medical records in epic; the patient had surgery on 9/20 which was uncomplicated, and was discharged on 9/21.  She returned to the ED yesterday morning with an episode of bleeding and was seen by Dr. Marcelline Mates, who performed the surgery, and cleared for discharge.  The patient also had an ultrasound at that time which was normal.  On exam, the patient is well-appearing.  Her vital signs are normal.  The abdomen is soft with no focal tenderness.  On vaginal exam, the patient does have some persistent oozing of blood although no large volume hemorrhage.  There is no evidence of prolapse or obvious dehiscence.  Hemoglobin is stable from when the patient was discharged from the hospital.  Overall presentation is consistent with postoperative bleeding.  I do not suspect large hematoma, seroma, or other concerning postsurgical complication.  I discussed the case with Dr. Amalia Hailey from gynecology who will come to evaluate the patient.  He does not recommend imaging at this time.  ----------------------------------------- 9:19 PM on 03/20/2020 -----------------------------------------  The patient has been evaluated by Dr.  Amalia Hailey from gynecology.  He advises that there was some bleeding coming from the posterior suture line, and he has added several sutures.  The bleeding has stopped.  The patient is ambulatory with no recurrent bleeding.  Dr. Amalia Hailey recommends that she is stable for discharge at this time.  The patient feels comfortable going home.  I gave her thorough return precautions and she expressed understanding.  ____________________________________________   FINAL CLINICAL IMPRESSION(S) / ED DIAGNOSES  Final diagnoses:  Postoperative vaginal bleeding following genitourinary procedure      NEW MEDICATIONS STARTED DURING THIS VISIT:  New Prescriptions   No medications on file     Note:  This document was prepared using Dragon voice recognition software and may include unintentional dictation errors.    Arta Silence, MD 03/20/20 2119

## 2020-03-20 NOTE — Consult Note (Signed)
Subjective:    Patient presents to the emergency department after having vaginal bleeding while trying to have a bowel movement.  She reported that she passed a large clot and continued to have vaginal bleeding.  She presented to the emergency department.  After being seen in the emergency department by the ED physician she was found to have a persistent trickle of blood vaginally. Patient denies lightheadedness. Reports urinating without difficulty.  Objective:    Patient Vitals for the past 2 hrs:  BP Pulse Resp SpO2  03/20/20 1930 121/73 84 18 98 %   No intake/output data recorded.  Labs: Results for orders placed or performed during the hospital encounter of 03/20/20 (from the past 24 hour(s))  Comprehensive metabolic panel     Status: Abnormal   Collection Time: 03/20/20  3:37 PM  Result Value Ref Range   Sodium 141 135 - 145 mmol/L   Potassium 3.1 (L) 3.5 - 5.1 mmol/L   Chloride 98 98 - 111 mmol/L   CO2 29 22 - 32 mmol/L   Glucose, Bld 111 (H) 70 - 99 mg/dL   BUN 18 8 - 23 mg/dL   Creatinine, Ser 1.40 (H) 0.44 - 1.00 mg/dL   Calcium 8.7 (L) 8.9 - 10.3 mg/dL   Total Protein 6.9 6.5 - 8.1 g/dL   Albumin 3.5 3.5 - 5.0 g/dL   AST 25 15 - 41 U/L   ALT 11 0 - 44 U/L   Alkaline Phosphatase 77 38 - 126 U/L   Total Bilirubin 0.6 0.3 - 1.2 mg/dL   GFR calc non Af Amer 37 (L) >60 mL/min   GFR calc Af Amer 43 (L) >60 mL/min   Anion gap 14 5 - 15  CBC     Status: Abnormal   Collection Time: 03/20/20  3:37 PM  Result Value Ref Range   WBC 4.0 4.0 - 10.5 K/uL   RBC 2.96 (L) 3.87 - 5.11 MIL/uL   Hemoglobin 9.6 (L) 12.0 - 15.0 g/dL   HCT 29.2 (L) 36 - 46 %   MCV 98.6 80.0 - 100.0 fL   MCH 32.4 26.0 - 34.0 pg   MCHC 32.9 30.0 - 36.0 g/dL   RDW 14.4 11.5 - 15.5 %   Platelets 202 150 - 400 K/uL   nRBC 0.0 0.0 - 0.2 %  Type and screen [facility]     Status: None (Preliminary result)   Collection Time: 03/20/20  3:37 PM  Result Value Ref Range    ABO/RH(D) PENDING    Antibody Screen PENDING    Sample Expiration      03/23/2020,2359 Performed at [facility], [address] [place], [place] [address]   Type and screen Ordered by PROVIDER DEFAULT     Status: None   Collection Time: 03/20/20  4:16 PM  Result Value Ref Range   ABO/RH(D) O POS    Antibody Screen NEG    Sample Expiration      03/23/2020,2359 Performed at [facility], [address], [place], [place] [address]     Medications    Patient's Medications  New Prescriptions   No medications on file  Previous Medications   ACETAMINOPHEN (TYLENOL) 325 MG TABLET    Take 325-650 mg by mouth every 6 (six) hours as needed for moderate pain or headache.   ALBUTEROL (PROVENTIL HFA;VENTOLIN HFA) 108 (90 BASE) MCG/ACT INHALER    Inhale 1-2 puffs into the lungs every 6 (six) hours as  needed for wheezing or shortness of breath.   ALBUTEROL (PROVENTIL) (2.5 MG/3ML) 0.083% NEBULIZER SOLUTION    Take 2.5 mg by nebulization every 6 (six) hours as needed for wheezing or shortness of breath.   ALENDRONATE (FOSAMAX) 70 MG TABLET    Take 70 mg by mouth every Friday.    ASPIRIN 81 MG CHEWABLE TABLET    Chew 81 mg by mouth daily.   BIOTIN (BIOTIN 5000) 5 MG CAPS    Take 5 mg by mouth daily.   CALCIUM CARB-CHOLECALCIFEROL (CALCIUM 600 + D PO)    Take 1 tablet by mouth daily.   CARBOXYMETHYLCELLULOSE SODIUM (LUBRICANT EYE DROPS OP)    Place 1 drop into both eyes daily as needed (dry eyes).   CITALOPRAM (CELEXA) 40 MG TABLET    Take 40 mg by mouth every morning.    CYANOCOBALAMIN (B-12) 2500 MCG TABS    Take 2,500 mcg by mouth daily.   DIPHENHYDRAMINE (BENADRYL) 25 MG TABLET    Take 25 mg by mouth at bedtime.    DOCUSATE SODIUM (COLACE) 100 MG CAPSULE    Take 1 capsule (100 mg total) by mouth 2 (two) times daily as needed.   ENTECAVIR (BARACLUDE) 0.5 MG TABLET    Take 0.5 mg by mouth every morning.    FERROUS GLUCONATE (IRON 27) 240 (27 FE) MG TABLET    Take 240 mg by  mouth daily.   FERROUS SULFATE (FERROUSUL) 325 (65 FE) MG TABLET    Take 1 tablet (325 mg total) by mouth daily with breakfast.   FUROSEMIDE (LASIX) 20 MG TABLET    Take 20 mg by mouth daily.   GABAPENTIN (NEURONTIN) 600 MG TABLET    Take 600 mg by mouth 3 (three) times daily.    MOMETASONE-FORMOTEROL (DULERA) 200-5 MCG/ACT AERO    Inhale 2 puffs into the lungs 2 (two) times daily.   MULTIPLE VITAMIN (MULTIVITAMIN WITH MINERALS) TABS TABLET    Take 1 tablet by mouth daily.   OMEPRAZOLE (PRILOSEC) 20 MG CAPSULE    Take 20 mg by mouth 2 (two) times daily.    OXYCODONE (ROXICODONE) 5 MG IMMEDIATE RELEASE TABLET    Take 1 tablet (5 mg total) by mouth every 6 (six) hours as needed for moderate pain or severe pain (may take up to 2 tablets for severe pain).   OXYCODONE-ACETAMINOPHEN (PERCOCET) 5-325 MG TABLET    Take 1-2 tablets by mouth every 6 (six) hours as needed for severe pain.   SIMETHICONE (GAS-X) 80 MG CHEWABLE TABLET    Chew 1 tablet (80 mg total) by mouth 4 (four) times daily as needed for flatulence.   TIZANIDINE (ZANAFLEX) 2 MG TABLET    Take 2 mg by mouth 3 (three) times daily as needed for muscle spasms.    WARFARIN (COUMADIN) 1 MG TABLET    TAKE ONE TABLET BY MOUTH EVERY DAY AT 6PM  Modified Medications   No medications on file  Discontinued Medications   No medications on file     Speculum examination reveals an intact vaginal cuff and anterior.  Posterior appears to have a disruption of the suture line and a small amount of bleeding.  After carefully examining all suture lines this seemed to be the area that continued to have a small amount of oozing.  2 figure-of-eight sutures of 2-0 Vicryl were placed.  This approximated the posterior and the bleeding was noted to resolve. Patient was observed for approximately 5 minutes and no further bleeding was  noted.  Assessment:    Disruption of posterior suture line causing vaginal bleeding.  Plan:    Patient may be discharged  home.  We discussed stool softeners and not straining with bowel movements.  Patient to contact Dr. Marcelline Mates and be seen in the office next week for further follow-up.   I spent 65 minutes involved in the care of this patient preparing to see the patient by obtaining and reviewing her medical history (including labs, imaging tests and prior procedures), documenting clinical information in the electronic health record (EHR), counseling and coordinating care plans, writing and sending prescriptions, ordering tests or procedures and directly communicating with the patient by discussing pertinent items from her history and physical exam, and suture repairing the posterior vagina as well as detailing my assessment and plan as noted above so that she has an informed understanding.  All of her questions were answered.   Finis Bud, M.D. 03/20/2020 9:03 PM

## 2020-03-20 NOTE — ED Notes (Signed)
Report given to Mac RN 

## 2020-03-20 NOTE — Telephone Encounter (Signed)
Pt returned call to the office. Pt stated that she was doing a lot better than yesterday. Pt stated that she was still sore and in some pain. Pt requested for a refill of medication Oxycodone. Pt was asked which medication she needed due to there were two orders placed. Pt stated that she had the 4 tablets ones. Pt was informed that she had a 20 tablet sent in as well. Pt was informed to contact her pharmacy to see if they could delivery her medication to her home. Pt stated that she would. Pt was given directions on how to take the pain medications prescribed by [facility] along with encouraging pt to take small walks throughout the day to help with bowel movements and gas. Pt voiced that she understood. AC is aware.

## 2020-03-20 NOTE — ED Notes (Signed)
MD Evans to bedside to examine pt.

## 2020-03-20 NOTE — Telephone Encounter (Signed)
Pt called no answer and no vm picked up. Will call cellphone number.

## 2020-03-21 LAB — TYPE AND SCREEN
ABO/RH(D): O POS
ABO/RH(D): O POS
Antibody Screen: NEGATIVE
Antibody Screen: NEGATIVE

## 2020-03-24 LAB — CULTURE, BLOOD (ROUTINE X 2): Culture: NO GROWTH

## 2020-03-25 ENCOUNTER — Inpatient Hospital Stay: Payer: Medicare Other

## 2020-03-25 ENCOUNTER — Inpatient Hospital Stay: Payer: Medicare Other | Admitting: Internal Medicine

## 2020-03-25 ENCOUNTER — Other Ambulatory Visit: Payer: Self-pay | Admitting: Obstetrics and Gynecology

## 2020-03-25 ENCOUNTER — Telehealth: Payer: Self-pay

## 2020-03-25 NOTE — Telephone Encounter (Signed)
oxyCODONE (ROXICODONE) 5 MG immediate release tablet  Pt states she needs a refill. She has tried tylenol and ibup but it is not helping.  Pls advise.  [employer].

## 2020-03-26 NOTE — Telephone Encounter (Signed)
Pt aware refill erx.

## 2020-03-28 ENCOUNTER — Encounter: Payer: Medicare Other | Admitting: Obstetrics and Gynecology

## 2020-04-01 ENCOUNTER — Encounter: Payer: Medicare Other | Admitting: Obstetrics and Gynecology

## 2020-04-03 ENCOUNTER — Ambulatory Visit (INDEPENDENT_AMBULATORY_CARE_PROVIDER_SITE_OTHER): Payer: Medicare Other | Admitting: Obstetrics and Gynecology

## 2020-04-03 ENCOUNTER — Other Ambulatory Visit: Payer: Self-pay

## 2020-04-03 ENCOUNTER — Encounter: Payer: Self-pay | Admitting: Obstetrics and Gynecology

## 2020-04-03 ENCOUNTER — Other Ambulatory Visit: Payer: Self-pay | Admitting: Obstetrics and Gynecology

## 2020-04-03 VITALS — BP 117/74 | HR 98 | Ht 64.0 in | Wt 182.3 lb

## 2020-04-03 DIAGNOSIS — Z90710 Acquired absence of both cervix and uterus: Secondary | ICD-10-CM

## 2020-04-03 DIAGNOSIS — Z5189 Encounter for other specified aftercare: Secondary | ICD-10-CM

## 2020-04-03 MED ORDER — OXYCODONE HCL 5 MG PO TABS: 5.0000 mg | ORAL_TABLET | Freq: Four times a day (QID) | ORAL | 0 refills | Status: DC | PRN

## 2020-04-03 NOTE — Progress Notes (Signed)
Pt present for post op visit. Pt stated that she was still having pain in the abd area. Pt is taking medication as prescribed. Pt has two rx for iron and unsure of which dose she is taking.

## 2020-04-03 NOTE — Patient Instructions (Signed)

## 2020-04-03 NOTE — Progress Notes (Signed)
    OBSTETRICS/GYNECOLOGY POST-OPERATIVE CLINIC VISIT  Subjective:     Erin [name] is a 74 y.o. female who presents to the clinic 2 weeks status post laparoscopic assisted vaginal hysterectomy and bilateral salpingo-oophorectomy, urethral sling placement, and posterior repair for pelvic relaxation. Eating a regular diet without difficulty. Bowel movements are normal. Pain is controlled with current analgesics. Medications being used: acetaminophen and narcotic analgesics including oxycodone (Oxycontin, Oxyir).   Her post-operative course was complicated by vaginal bleeding for which she was seen twice in the ER for 2 weeks ago, however has resolved except for occasional spotting. Does still note some pain, notes Oxycodone helps but is running out.    The following portions of the patient's history were reviewed and updated as appropriate: allergies, current medications, past family history, past medical history, past social history, past surgical history and problem list.   Review of Systems Pertinent items noted in HPI and remainder of comprehensive ROS otherwise negative.    Objective:    BP 117/74   Pulse 98   Ht 5\' 4"  (1.626 m)   Wt 182 lb 4.8 oz (82.7 kg)   BMI 31.29 kg/m  General:  alert and no distress  Abdomen: soft, bowel sounds active, non-tender  Incision:   healing well, no drainage, no erythema, no hernia, no seroma, no swelling, no dehiscence, incision well approximated.     Pathology:   A. UTERUS WITH CERVIX AND BILATERAL FALLOPIAN TUBES AND OVARIES; TOTAL  HYSTERECTOMY WITH BILATERAL SALPINGO-OOPHORECTOMY:  - UTERINE CERVIX:    - BENIGN TRANSFORMATION ZONE.    - NEGATIVE FOR SQUAMOUS INTRAEPITHELIAL LESION AND MALIGNANCY.  - ENDOMETRIUM:    - BENIGN ENDOMETRIAL TISSUE WITH CYSTIC ATROPHY.    - NEGATIVE FOR ATYPICAL HYPERPLASIA/EIN AND MALIGNANCY.  - MYOMETRIUM:    - NEGATIVE FOR FEATURES OF MALIGNANCY.  - FALLOPIAN TUBES:    - NO  SIGNIFICANT HISTOPATHOLOGIC CHANGE.  - OVARIES:     - BENIGN PHYSIOLOGIC CHANGES.   Assessment:    Doing well postoperatively.  S/p LAVH with BSO.   Plan:   1. Continue any current medications. Patient desires short supply for a few more days.  2. Wound care discussed. 3. Operative findings again reviewed. Pathology report discussed. 4. Activity restrictions: no bending, stooping, or squatting and no lifting more than 10-15 pounds.  5. Anticipated return to work: not applicable. 6. Follow up: 4 weeks for final post-op check.     Rubie Maid, MD [facility]

## 2020-04-07 ENCOUNTER — Other Ambulatory Visit: Payer: Self-pay | Admitting: Obstetrics and Gynecology

## 2020-04-08 ENCOUNTER — Encounter: Payer: Medicare Other | Admitting: Obstetrics and Gynecology

## 2020-04-09 ENCOUNTER — Telehealth: Payer: Self-pay

## 2020-04-09 DIAGNOSIS — G8918 Other acute postprocedural pain: Secondary | ICD-10-CM

## 2020-04-09 NOTE — Telephone Encounter (Signed)
Patient called in stating she is having pain where he ovaries once where. Patient states she feels like she is cramping like she's on her period. Patient is requesting more pain meds.

## 2020-04-10 ENCOUNTER — Other Ambulatory Visit: Payer: Self-pay | Admitting: Obstetrics and Gynecology

## 2020-04-10 MED ORDER — TRAMADOL HCL 50 MG PO TABS: 50.0000 mg | ORAL_TABLET | Freq: Four times a day (QID) | ORAL | 0 refills | Status: DC | PRN

## 2020-04-10 NOTE — Telephone Encounter (Signed)
Please inform patient that she can take up to 1000 mg of Tylenol (2 Extra strength) every 8 hours. As I informed her before,  I am not going to prescribe any more strong narcotic medication, but she can have a short supply of Ultram which I have sent to her pharmacy.

## 2020-04-10 NOTE — Telephone Encounter (Signed)
Dr. Marcelline Mates, Please advise. Thanks [employer]

## 2020-04-14 ENCOUNTER — Other Ambulatory Visit: Payer: Self-pay

## 2020-04-14 ENCOUNTER — Inpatient Hospital Stay (HOSPITAL_BASED_OUTPATIENT_CLINIC_OR_DEPARTMENT_OTHER): Payer: Medicare Other | Admitting: Internal Medicine

## 2020-04-14 ENCOUNTER — Inpatient Hospital Stay: Payer: Medicare Other | Attending: Internal Medicine

## 2020-04-14 ENCOUNTER — Encounter: Payer: Self-pay | Admitting: Internal Medicine

## 2020-04-14 VITALS — BP 105/77 | HR 87 | Temp 98.2°F | Resp 16 | Ht 64.0 in | Wt 181.0 lb

## 2020-04-14 DIAGNOSIS — Z7901 Long term (current) use of anticoagulants: Secondary | ICD-10-CM | POA: Diagnosis not present

## 2020-04-14 DIAGNOSIS — Z79899 Other long term (current) drug therapy: Secondary | ICD-10-CM | POA: Insufficient documentation

## 2020-04-14 DIAGNOSIS — C8253 Diffuse follicle center lymphoma, intra-abdominal lymph nodes: Secondary | ICD-10-CM

## 2020-04-14 DIAGNOSIS — Z7951 Long term (current) use of inhaled steroids: Secondary | ICD-10-CM | POA: Insufficient documentation

## 2020-04-14 DIAGNOSIS — Z90710 Acquired absence of both cervix and uterus: Secondary | ICD-10-CM | POA: Insufficient documentation

## 2020-04-14 DIAGNOSIS — Z7982 Long term (current) use of aspirin: Secondary | ICD-10-CM | POA: Insufficient documentation

## 2020-04-14 DIAGNOSIS — Z9079 Acquired absence of other genital organ(s): Secondary | ICD-10-CM | POA: Diagnosis not present

## 2020-04-14 DIAGNOSIS — D649 Anemia, unspecified: Secondary | ICD-10-CM | POA: Diagnosis not present

## 2020-04-14 DIAGNOSIS — Z809 Family history of malignant neoplasm, unspecified: Secondary | ICD-10-CM | POA: Insufficient documentation

## 2020-04-14 DIAGNOSIS — Z90722 Acquired absence of ovaries, bilateral: Secondary | ICD-10-CM | POA: Insufficient documentation

## 2020-04-14 DIAGNOSIS — N183 Chronic kidney disease, stage 3 unspecified: Secondary | ICD-10-CM | POA: Insufficient documentation

## 2020-04-14 DIAGNOSIS — Z8572 Personal history of non-Hodgkin lymphomas: Secondary | ICD-10-CM | POA: Diagnosis not present

## 2020-04-14 LAB — CBC WITH DIFFERENTIAL/PLATELET
Abs Immature Granulocytes: 0.01 10*3/uL (ref 0.00–0.07)
Basophils Absolute: 0 10*3/uL (ref 0.0–0.1)
Basophils Relative: 1 %
Eosinophils Absolute: 0.2 10*3/uL (ref 0.0–0.5)
Eosinophils Relative: 3 %
HCT: 35.5 % — ABNORMAL LOW (ref 36.0–46.0)
Hemoglobin: 12 g/dL (ref 12.0–15.0)
Immature Granulocytes: 0 %
Lymphocytes Relative: 35 %
Lymphs Abs: 2 10*3/uL (ref 0.7–4.0)
MCH: 32.3 pg (ref 26.0–34.0)
MCHC: 33.8 g/dL (ref 30.0–36.0)
MCV: 95.4 fL (ref 80.0–100.0)
Monocytes Absolute: 0.6 10*3/uL (ref 0.1–1.0)
Monocytes Relative: 10 %
Neutro Abs: 2.9 10*3/uL (ref 1.7–7.7)
Neutrophils Relative %: 51 %
Platelets: 211 10*3/uL (ref 150–400)
RBC: 3.72 MIL/uL — ABNORMAL LOW (ref 3.87–5.11)
RDW: 13.9 % (ref 11.5–15.5)
WBC: 5.6 10*3/uL (ref 4.0–10.5)
nRBC: 0 % (ref 0.0–0.2)

## 2020-04-14 LAB — COMPREHENSIVE METABOLIC PANEL
ALT: 15 U/L (ref 0–44)
AST: 21 U/L (ref 15–41)
Albumin: 3.9 g/dL (ref 3.5–5.0)
Alkaline Phosphatase: 89 U/L (ref 38–126)
Anion gap: 4 — ABNORMAL LOW (ref 5–15)
BUN: 20 mg/dL (ref 8–23)
CO2: 28 mmol/L (ref 22–32)
Calcium: 8.5 mg/dL — ABNORMAL LOW (ref 8.9–10.3)
Chloride: 105 mmol/L (ref 98–111)
Creatinine, Ser: 1.54 mg/dL — ABNORMAL HIGH (ref 0.44–1.00)
GFR, Estimated: 33 mL/min — ABNORMAL LOW (ref >60)
Glucose, Bld: 149 mg/dL — ABNORMAL HIGH (ref 70–99)
Potassium: 3.6 mmol/L (ref 3.5–5.1)
Sodium: 137 mmol/L (ref 135–145)
Total Bilirubin: 0.6 mg/dL (ref 0.3–1.2)
Total Protein: 7.2 g/dL (ref 6.5–8.1)

## 2020-04-14 LAB — IRON AND TIBC
Iron: 68 ug/dL (ref 28–170)
Saturation Ratios: 23 % (ref 10.4–31.8)
TIBC: 291 ug/dL (ref 250–450)
UIBC: 223 ug/dL

## 2020-04-14 LAB — LACTATE DEHYDROGENASE: LDH: 135 U/L (ref 98–192)

## 2020-04-14 LAB — FERRITIN: Ferritin: 58 ng/mL (ref 11–307)

## 2020-04-14 MED ORDER — HEPARIN SOD (PORK) LOCK FLUSH 100 UNIT/ML IV SOLN
500.0000 [IU] | Freq: Once | INTRAVENOUS | Status: AC
  Administered 2020-04-14: 500 [IU] via INTRAVENOUS
  Filled 2020-04-14: qty 5

## 2020-04-14 MED ORDER — HEPARIN SOD (PORK) LOCK FLUSH 100 UNIT/ML IV SOLN
INTRAVENOUS | Status: AC
  Filled 2020-04-14: qty 5

## 2020-04-14 MED ORDER — SODIUM CHLORIDE 0.9% FLUSH
10.0000 mL | Freq: Once | INTRAVENOUS | Status: AC
  Administered 2020-04-14: 10 mL via INTRAVENOUS
  Filled 2020-04-14: qty 10

## 2020-04-14 NOTE — Telephone Encounter (Signed)
Pt called no answer LM via VM to call the office to go over information given by [facility] concerning how to take medication to help with her pain.
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
## 2020-05-01 ENCOUNTER — Other Ambulatory Visit: Payer: Self-pay

## 2020-05-01 ENCOUNTER — Encounter: Payer: Self-pay | Admitting: Obstetrics and Gynecology

## 2020-05-01 ENCOUNTER — Ambulatory Visit (INDEPENDENT_AMBULATORY_CARE_PROVIDER_SITE_OTHER): Payer: Medicare Other | Admitting: Obstetrics and Gynecology

## 2020-05-01 VITALS — BP 110/70 | HR 102 | Ht 64.0 in | Wt 183.6 lb

## 2020-05-01 DIAGNOSIS — N3946 Mixed incontinence: Secondary | ICD-10-CM

## 2020-05-01 DIAGNOSIS — Z90710 Acquired absence of both cervix and uterus: Secondary | ICD-10-CM

## 2020-05-01 DIAGNOSIS — Z5189 Encounter for other specified aftercare: Secondary | ICD-10-CM

## 2020-05-01 NOTE — Progress Notes (Signed)
    OBSTETRICS/GYNECOLOGY POST-OPERATIVE CLINIC VISIT  Subjective:     Erin [name] is a 74 y.o. female who presents to the clinic 6 weeks status post laparoscopic assisted vaginal hysterectomy and bilateral salpingo-oophorectomy, urethral sling placement, and posterior repair for pelvic relaxation and mixed urinary incontinence. Eating a regular diet without difficulty. Bowel movements are normal. The patient is not having any pain.   Her post-operative course was complicated by vaginal bleeding and prolonged post-operative pain which has resolved. She does still note some issues with urinary urgency, but states it is usually if she has been holding her bladder for too long. Is trying to work on performing her Kegel exercises.   Patient reports current stressor of her son currently being in the hospital. Notes being worried and having some anxiety.   The following portions of the patient's history were reviewed and updated as appropriate: allergies, current medications, past family history, past medical history, past social history, past surgical history and problem list.   Review of Systems Pertinent items noted in HPI and remainder of comprehensive ROS otherwise negative.    Objective:    BP 110/70   Pulse (!) 102   Ht 5\' 4"  (1.626 m)   Wt 183 lb 9.6 oz (83.3 kg)   BMI 31.51 kg/m  General:  alert and no distress  Abdomen: soft, bowel sounds active, non-tender  Incision:   healing well, no drainage, no erythema, no hernia, no seroma, no swelling, no dehiscence, incision well approximated.   Pelvis:  external genitalia normal, rectovaginal septum normal.  Vagina without discharge. Two small areas of granulation tissue noted on posterior and anterior portion of cervix.     Pathology:   A. UTERUS WITH CERVIX AND BILATERAL FALLOPIAN TUBES AND OVARIES; TOTAL  HYSTERECTOMY WITH BILATERAL SALPINGO-OOPHORECTOMY:  - UTERINE CERVIX:    - BENIGN TRANSFORMATION ZONE.    - NEGATIVE  FOR SQUAMOUS INTRAEPITHELIAL LESION AND MALIGNANCY.  - ENDOMETRIUM:    - BENIGN ENDOMETRIAL TISSUE WITH CYSTIC ATROPHY.    - NEGATIVE FOR ATYPICAL HYPERPLASIA/EIN AND MALIGNANCY.  - MYOMETRIUM:    - NEGATIVE FOR FEATURES OF MALIGNANCY.  - FALLOPIAN TUBES:    - NO SIGNIFICANT HISTOPATHOLOGIC CHANGE.  - OVARIES:     - BENIGN PHYSIOLOGIC CHANGES.   Assessment:   Doing well postoperatively. S/p LAVH with BSO with sling placement.  Mixed urinary incontinence  Plan:   1. Continue any current medications. Patient desires short supply for a few more days.  2. Operative findings again reviewed. Pathology report discussed.  3. Mixed urinary incontinence, stress component managed with sling, continue Kegels, can consider medications if urgency component persists.  4. Activity restrictions: none.  5. Anticipated return to work: not applicable. 6. Follow up: as needed.     Rubie Maid, MD [facility]

## 2020-05-01 NOTE — Progress Notes (Signed)
Pt present for post op follow up. Pt stated that she was doing a lot better since her last visit.

## 2020-05-12 ENCOUNTER — Telehealth: Payer: Self-pay | Admitting: *Deleted

## 2020-05-12 NOTE — Telephone Encounter (Signed)
Patient called asking that we send records to [place] her case worker stating why she is on blood thinners. I do see a request dated 110/13/21 for records in the [employer] tablet. Do you know if this has been sent?

## 2020-06-06 ENCOUNTER — Encounter: Payer: Self-pay | Admitting: *Deleted

## 2020-06-06 ENCOUNTER — Telehealth: Payer: Self-pay | Admitting: *Deleted

## 2020-06-06 DIAGNOSIS — C8253 Diffuse follicle center lymphoma, intra-abdominal lymph nodes: Secondary | ICD-10-CM

## 2020-06-06 NOTE — Telephone Encounter (Addendum)
1110 am - incoming call from lawyer' "Victorino Sparrow."  Patient needs medical clearance to stop warfarin for an injection.  [phone]  [phone]  Ext [phone]  She will fax a release form to our office.

## 2020-06-06 NOTE — Telephone Encounter (Signed)
Per Dr. Jacinto Reap- md is requesting - cbc, metc, pt/inr to be draw prior to coming off the coumadin. Letter may be written for patient to hold coumadin 1 week prior to her procedures.

## 2020-06-06 NOTE — Telephone Encounter (Signed)
I contacted Erin [name] back at the [employer] and left our fax # and direct phone number.  I requested a release of info be sent and request for clearance to be faxed to [phone]
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
## 2020-06-06 NOTE — Telephone Encounter (Signed)
Clearance Letter faxed to law firm

## 2020-06-06 NOTE — Telephone Encounter (Signed)
Patient called back and request that lab apt be moved to 12/20 due to ride issues.

## 2020-06-06 NOTE — Telephone Encounter (Signed)
Patient also called and wants Dr. B to send a letter to her lawyer's office Victorino Sparrow for the patient to be taking off blood thinners prior to the injection procedures long in enough to get steriod injection in her back.

## 2020-06-10 ENCOUNTER — Telehealth: Payer: Self-pay | Admitting: *Deleted

## 2020-06-10 NOTE — Telephone Encounter (Signed)
Sikiu Azerbaijan- patient's Case Manager with [employer] called with patient in 3 way call to [facility].   The Law firm received the clearance letter-patient will be set up with [facility] for her apts. Lawyer's office will message me with date of procedure. Patient understands that she will come on 12/20 for lab check.   [facility] contact info [phone]; fax: [phone]

## 2020-06-11 ENCOUNTER — Other Ambulatory Visit: Payer: Medicare Other

## 2020-06-16 ENCOUNTER — Telehealth: Payer: Self-pay | Admitting: *Deleted

## 2020-06-16 ENCOUNTER — Other Ambulatory Visit: Payer: Self-pay | Admitting: *Deleted

## 2020-06-16 ENCOUNTER — Inpatient Hospital Stay: Payer: Medicare Other | Attending: Internal Medicine

## 2020-06-16 DIAGNOSIS — Z7901 Long term (current) use of anticoagulants: Secondary | ICD-10-CM | POA: Insufficient documentation

## 2020-06-16 DIAGNOSIS — Z8572 Personal history of non-Hodgkin lymphomas: Secondary | ICD-10-CM | POA: Insufficient documentation

## 2020-06-16 DIAGNOSIS — C8253 Diffuse follicle center lymphoma, intra-abdominal lymph nodes: Secondary | ICD-10-CM

## 2020-06-16 DIAGNOSIS — Z79899 Other long term (current) drug therapy: Secondary | ICD-10-CM | POA: Diagnosis not present

## 2020-06-16 DIAGNOSIS — Z86718 Personal history of other venous thrombosis and embolism: Secondary | ICD-10-CM | POA: Diagnosis not present

## 2020-06-16 DIAGNOSIS — E876 Hypokalemia: Secondary | ICD-10-CM

## 2020-06-16 LAB — COMPREHENSIVE METABOLIC PANEL
ALT: 13 U/L (ref 0–44)
AST: 20 U/L (ref 15–41)
Albumin: 3.7 g/dL (ref 3.5–5.0)
Alkaline Phosphatase: 93 U/L (ref 38–126)
Anion gap: 10 (ref 5–15)
BUN: 16 mg/dL (ref 8–23)
CO2: 27 mmol/L (ref 22–32)
Calcium: 8.6 mg/dL — ABNORMAL LOW (ref 8.9–10.3)
Chloride: 99 mmol/L (ref 98–111)
Creatinine, Ser: 1.53 mg/dL — ABNORMAL HIGH (ref 0.44–1.00)
GFR, Estimated: 36 mL/min — ABNORMAL LOW (ref >60)
Glucose, Bld: 171 mg/dL — ABNORMAL HIGH (ref 70–99)
Potassium: 3.1 mmol/L — ABNORMAL LOW (ref 3.5–5.1)
Sodium: 136 mmol/L (ref 135–145)
Total Bilirubin: 0.3 mg/dL (ref 0.3–1.2)
Total Protein: 7.1 g/dL (ref 6.5–8.1)

## 2020-06-16 LAB — CBC WITH DIFFERENTIAL/PLATELET
Abs Immature Granulocytes: 0.03 10*3/uL (ref 0.00–0.07)
Basophils Absolute: 0 10*3/uL (ref 0.0–0.1)
Basophils Relative: 1 %
Eosinophils Absolute: 0.1 10*3/uL (ref 0.0–0.5)
Eosinophils Relative: 1 %
HCT: 35.7 % — ABNORMAL LOW (ref 36.0–46.0)
Hemoglobin: 11.7 g/dL — ABNORMAL LOW (ref 12.0–15.0)
Immature Granulocytes: 1 %
Lymphocytes Relative: 21 %
Lymphs Abs: 0.9 10*3/uL (ref 0.7–4.0)
MCH: 31.5 pg (ref 26.0–34.0)
MCHC: 32.8 g/dL (ref 30.0–36.0)
MCV: 96 fL (ref 80.0–100.0)
Monocytes Absolute: 0.4 10*3/uL (ref 0.1–1.0)
Monocytes Relative: 10 %
Neutro Abs: 2.9 10*3/uL (ref 1.7–7.7)
Neutrophils Relative %: 66 %
Platelets: 169 10*3/uL (ref 150–400)
RBC: 3.72 MIL/uL — ABNORMAL LOW (ref 3.87–5.11)
RDW: 13.5 % (ref 11.5–15.5)
WBC: 4.2 10*3/uL (ref 4.0–10.5)
nRBC: 0 % (ref 0.0–0.2)

## 2020-06-16 LAB — PROTIME-INR
INR: 0.9 (ref 0.8–1.2)
Prothrombin Time: 11.8 seconds (ref 11.4–15.2)

## 2020-06-16 MED ORDER — POTASSIUM CHLORIDE CRYS ER 20 MEQ PO TBCR: 20.0000 meq | EXTENDED_RELEASE_TABLET | Freq: Every day | ORAL | 0 refills | Status: DC

## 2020-06-16 NOTE — Telephone Encounter (Signed)
Contacted patient. Left voice mail. Requested patient to return my phone call.  Sent potassium 20 meq 1 tablet daily x 7 days - RX to patient's pharmacy per Dr. Rogue Bussing. Potassium level is 3.1

## 2020-06-17 NOTE — Telephone Encounter (Signed)
Spoke with patient.  Patient aware that potassium level was slightly low.  MD sent script to her potassium pharmacy.

## 2020-06-17 NOTE — Telephone Encounter (Signed)
Left vm for patient -requesting a return phone call. 

## 2020-07-11 IMAGING — MG DIGITAL SCREENING BILATERAL MAMMOGRAM WITH TOMO AND CAD
8 series · 8 of 24 positions shown · non-contrast
Comparison: Previous exam(s).

ACR Breast Density Category a: The breast tissue is almost entirely
fatty.

CLINICAL DATA: Screening.

EXAM:
DIGITAL SCREENING BILATERAL MAMMOGRAM WITH TOMO AND CAD

[R CC synth-2D]
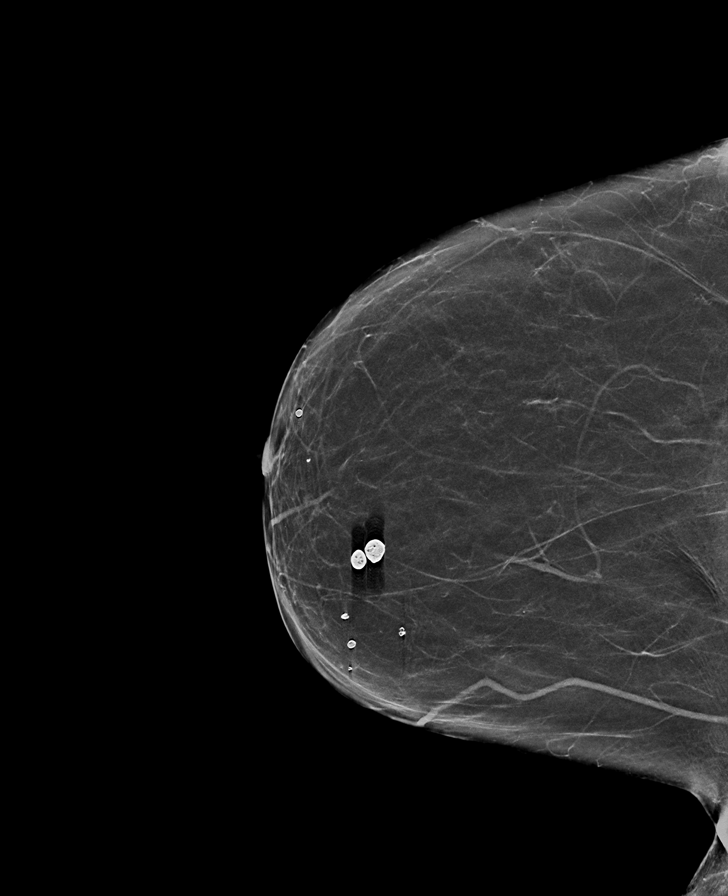

[R MLO synth-2D]
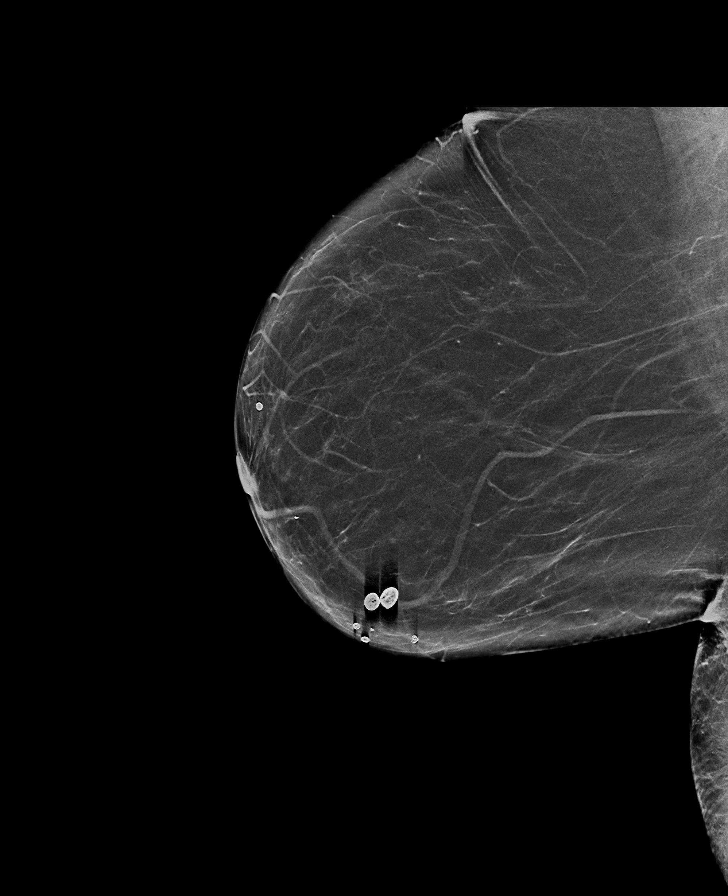

[L MLO synth-2D]
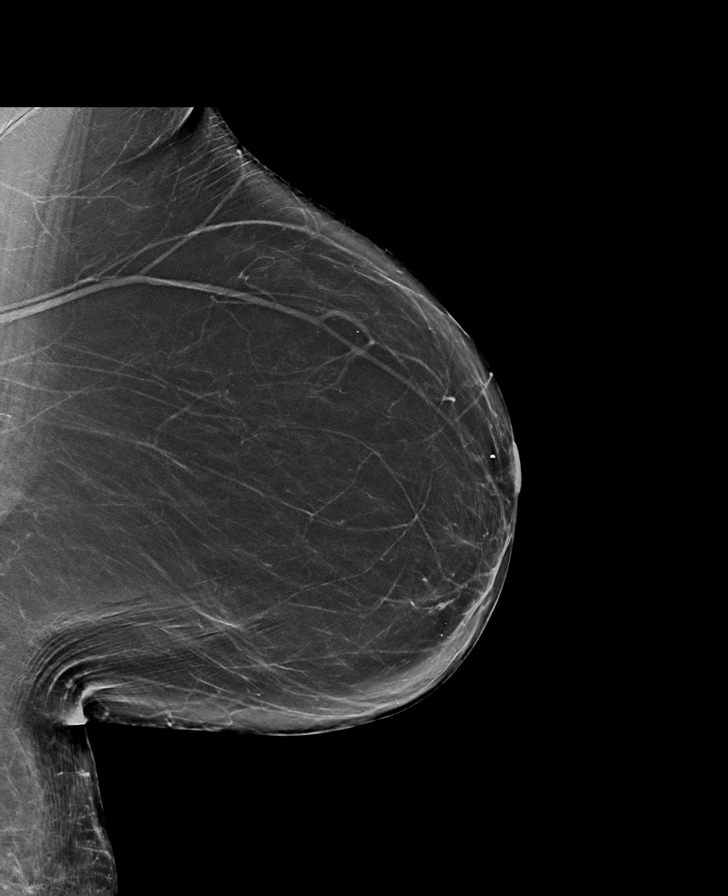

[L CC synth-2D]
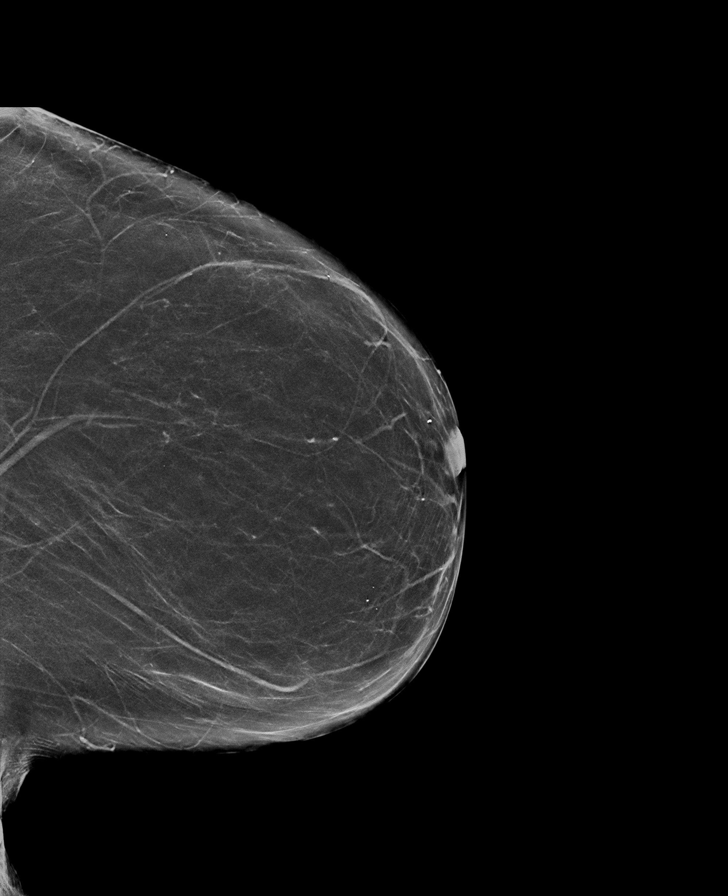

[L MLO tomo · tomo slice 38/75.0]
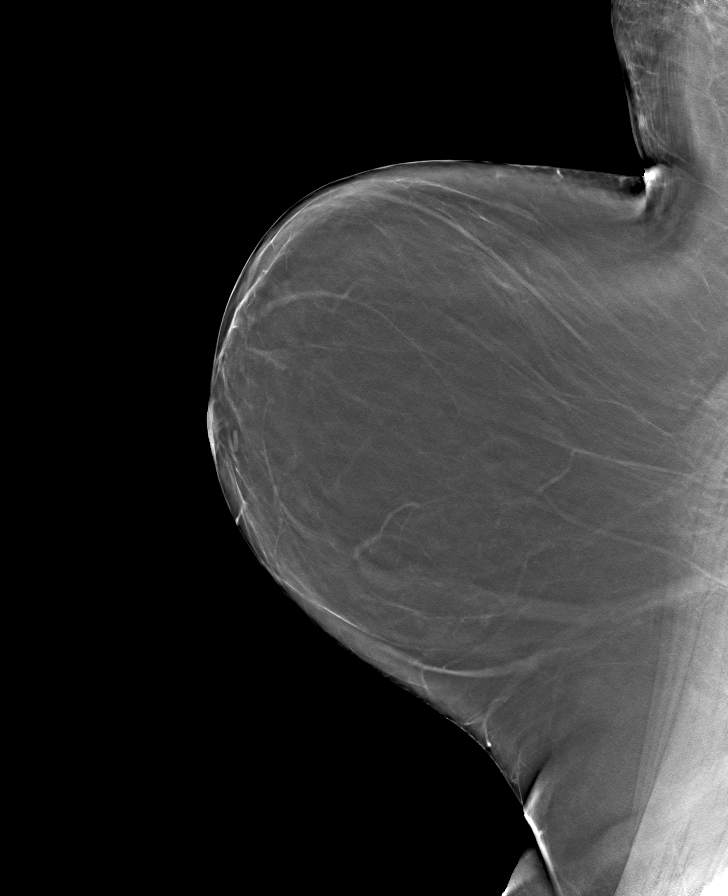

[L CC tomo · tomo slice 33/65.0]
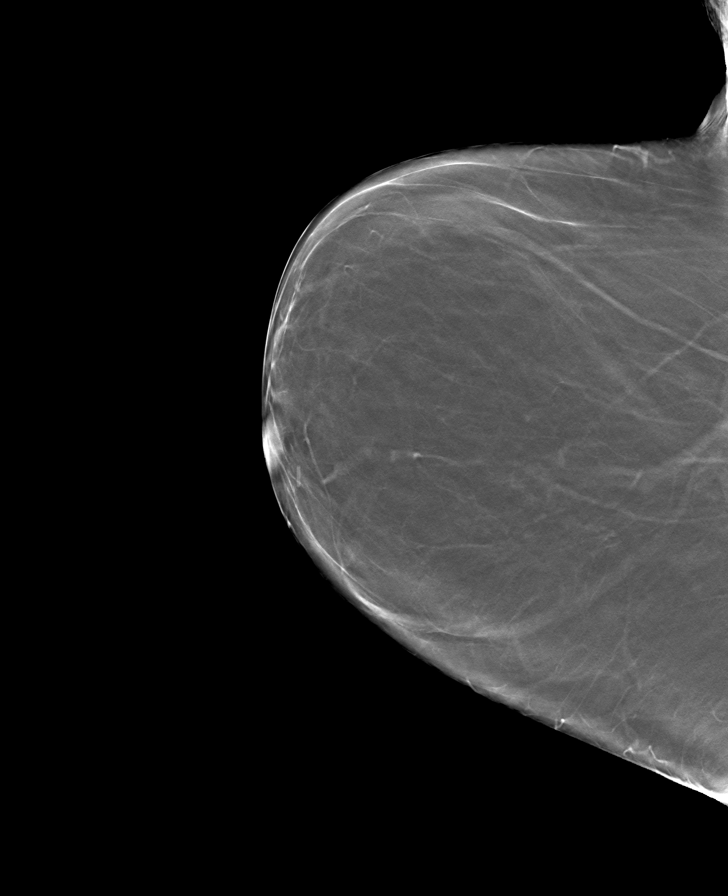

[R MLO tomo · tomo slice 34/67.0]
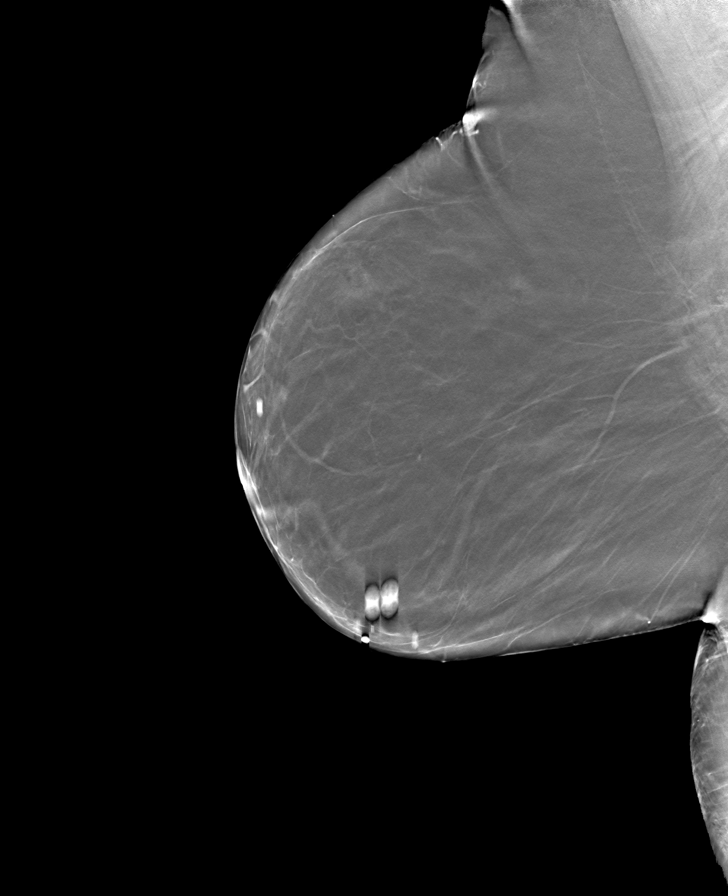

[R CC tomo · tomo slice 30/59.0]
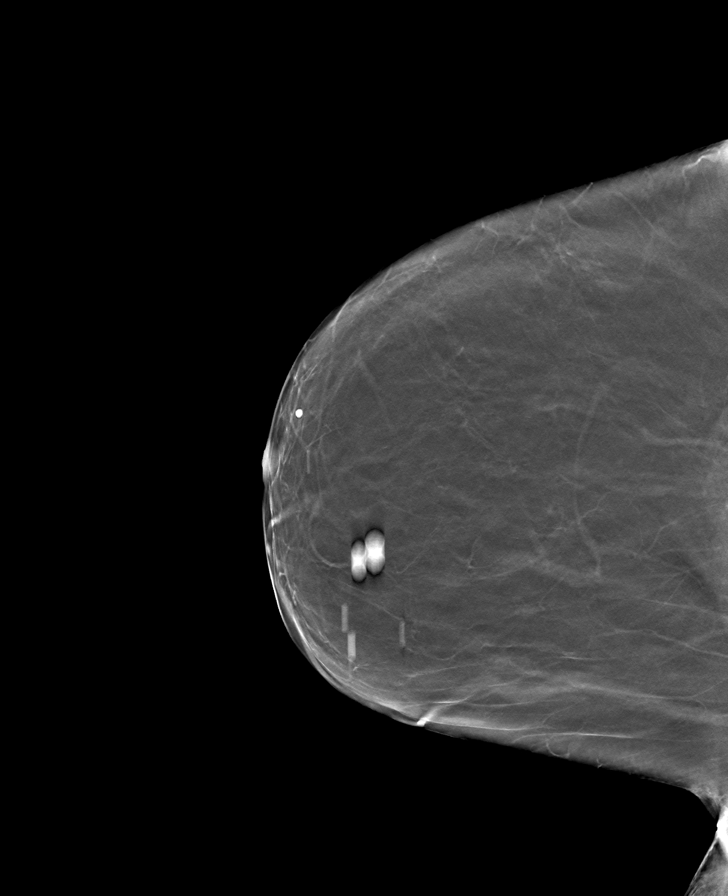

[8 of 24 positions shown; findings below may reference images not displayed]

FINDINGS: There are no findings suspicious for malignancy. Images were
processed with CAD.
IMPRESSION: No mammographic evidence of malignancy. A result letter of this
screening mammogram will be mailed directly to the patient.

RECOMMENDATION:
Screening mammogram in one year. (Code:[id number])

BI-RADS CATEGORY  1: Negative.

## 2020-07-17 ENCOUNTER — Inpatient Hospital Stay: Payer: Medicare Other | Attending: Internal Medicine

## 2020-07-17 DIAGNOSIS — Z452 Encounter for adjustment and management of vascular access device: Secondary | ICD-10-CM | POA: Diagnosis not present

## 2020-07-17 DIAGNOSIS — Z8572 Personal history of non-Hodgkin lymphomas: Secondary | ICD-10-CM | POA: Insufficient documentation

## 2020-07-17 DIAGNOSIS — Z95828 Presence of other vascular implants and grafts: Secondary | ICD-10-CM

## 2020-07-17 MED ORDER — HEPARIN SOD (PORK) LOCK FLUSH 100 UNIT/ML IV SOLN
500.0000 [IU] | Freq: Once | INTRAVENOUS | Status: AC
  Administered 2020-07-17: 500 [IU] via INTRAVENOUS
  Filled 2020-07-17: qty 5

## 2020-07-17 MED ORDER — SODIUM CHLORIDE 0.9% FLUSH
10.0000 mL | Freq: Once | INTRAVENOUS | Status: AC
  Administered 2020-07-17: 10 mL via INTRAVENOUS
  Filled 2020-07-17: qty 10

## 2020-07-17 MED ORDER — HEPARIN SOD (PORK) LOCK FLUSH 100 UNIT/ML IV SOLN
INTRAVENOUS | Status: AC
  Filled 2020-07-17: qty 5

## 2020-08-11 ENCOUNTER — Telehealth: Payer: Self-pay

## 2020-08-11 ENCOUNTER — Encounter: Payer: Self-pay | Admitting: *Deleted

## 2020-08-11 ENCOUNTER — Telehealth: Payer: Self-pay | Admitting: *Deleted

## 2020-08-11 NOTE — Telephone Encounter (Signed)
Spoke with Sikiu the lawyer for patient. Patient took blood thinner today when she was not supposed to. Wanted to see if we can get an INR reading at some point this week before her procedure on Monday. Per Dr. Jacinto Reap ok to get INR. Sent Sikiu an e-mail letting her know that lab appointment was scheduled on 08/12/20 @ 10:45 and asked her to schedule a ride for patient. If time does not work out told her to let us know. Pt is also aware of appointment.

## 2020-08-11 NOTE — Telephone Encounter (Signed)
Patient called stating that she needs another letter as was written before to come off of her blood thinners and she needs it before the 21 st when she has an appointment in [facility]. She said to send it to the same place as before

## 2020-08-11 NOTE — Telephone Encounter (Signed)
Dr. Jacinto Reap- do you need to check the patient's labs again prior to writing the clearance letter?

## 2020-08-11 NOTE — Telephone Encounter (Signed)
Spoke with patient. She needs the letter sent to [facility]. Letter to be written and faxed: [phone]  No labs needed per Dr. Jacinto Reap.  Per md "Hold coumadin 1 week prior to the apt"  Patient aware that her last dose of coumadin should be today. Procedure is in 1 week per patient.

## 2020-08-12 ENCOUNTER — Inpatient Hospital Stay: Payer: Medicare Other | Attending: Internal Medicine

## 2020-08-12 ENCOUNTER — Other Ambulatory Visit: Payer: Self-pay | Admitting: *Deleted

## 2020-08-12 DIAGNOSIS — Z86718 Personal history of other venous thrombosis and embolism: Secondary | ICD-10-CM | POA: Insufficient documentation

## 2020-08-12 DIAGNOSIS — Z7901 Long term (current) use of anticoagulants: Secondary | ICD-10-CM

## 2020-08-12 DIAGNOSIS — Z8572 Personal history of non-Hodgkin lymphomas: Secondary | ICD-10-CM | POA: Diagnosis not present

## 2020-08-12 LAB — PROTIME-INR
INR: 0.9 (ref 0.8–1.2)
Prothrombin Time: 11.4 seconds (ref 11.4–15.2)

## 2020-08-13 ENCOUNTER — Telehealth: Payer: Self-pay

## 2020-08-13 NOTE — Telephone Encounter (Signed)
Faxed over recent INR results to [facility]

## 2020-08-13 NOTE — Telephone Encounter (Signed)
[facility] called and asked it Dr. B would approve her being given Ativan before injection on 2/21. Per Dr. B that would be ok. Gave verbal ok per provider request.

## 2020-09-08 ENCOUNTER — Other Ambulatory Visit: Payer: Self-pay | Admitting: Family Medicine

## 2020-09-08 DIAGNOSIS — Z1231 Encounter for screening mammogram for malignant neoplasm of breast: Secondary | ICD-10-CM

## 2020-09-26 ENCOUNTER — Other Ambulatory Visit: Payer: Self-pay

## 2020-09-26 ENCOUNTER — Ambulatory Visit
Admission: RE | Admit: 2020-09-26 | Discharge: 2020-09-26 | Disposition: A | Discharge status: Home / Self Care | Payer: Medicare Other | Source: Ambulatory Visit | Attending: Family Medicine | Admitting: Family Medicine

## 2020-09-26 DIAGNOSIS — Z1231 Encounter for screening mammogram for malignant neoplasm of breast: Secondary | ICD-10-CM | POA: Diagnosis not present

## 2020-10-13 ENCOUNTER — Other Ambulatory Visit: Payer: Self-pay

## 2020-10-13 ENCOUNTER — Encounter: Payer: Self-pay | Admitting: Internal Medicine

## 2020-10-13 ENCOUNTER — Inpatient Hospital Stay: Payer: Medicare Other | Attending: Internal Medicine

## 2020-10-13 ENCOUNTER — Inpatient Hospital Stay (HOSPITAL_BASED_OUTPATIENT_CLINIC_OR_DEPARTMENT_OTHER): Payer: Medicare Other | Admitting: Internal Medicine

## 2020-10-13 VITALS — BP 128/72 | HR 92 | Temp 98.5°F | Resp 20 | Ht 64.0 in | Wt 184.0 lb

## 2020-10-13 DIAGNOSIS — Z7901 Long term (current) use of anticoagulants: Secondary | ICD-10-CM | POA: Insufficient documentation

## 2020-10-13 DIAGNOSIS — D649 Anemia, unspecified: Secondary | ICD-10-CM | POA: Insufficient documentation

## 2020-10-13 DIAGNOSIS — Z79899 Other long term (current) drug therapy: Secondary | ICD-10-CM | POA: Diagnosis not present

## 2020-10-13 DIAGNOSIS — J45909 Unspecified asthma, uncomplicated: Secondary | ICD-10-CM | POA: Diagnosis not present

## 2020-10-13 DIAGNOSIS — Z7951 Long term (current) use of inhaled steroids: Secondary | ICD-10-CM | POA: Insufficient documentation

## 2020-10-13 DIAGNOSIS — Z7982 Long term (current) use of aspirin: Secondary | ICD-10-CM | POA: Insufficient documentation

## 2020-10-13 DIAGNOSIS — Z803 Family history of malignant neoplasm of breast: Secondary | ICD-10-CM | POA: Diagnosis not present

## 2020-10-13 DIAGNOSIS — Z7952 Long term (current) use of systemic steroids: Secondary | ICD-10-CM | POA: Insufficient documentation

## 2020-10-13 DIAGNOSIS — N183 Chronic kidney disease, stage 3 unspecified: Secondary | ICD-10-CM | POA: Diagnosis not present

## 2020-10-13 DIAGNOSIS — Z90721 Acquired absence of ovaries, unilateral: Secondary | ICD-10-CM | POA: Diagnosis not present

## 2020-10-13 DIAGNOSIS — C8253 Diffuse follicle center lymphoma, intra-abdominal lymph nodes: Secondary | ICD-10-CM | POA: Diagnosis not present

## 2020-10-13 LAB — CBC WITH DIFFERENTIAL/PLATELET
Abs Immature Granulocytes: 0.03 10*3/uL (ref 0.00–0.07)
Basophils Absolute: 0.1 10*3/uL (ref 0.0–0.1)
Basophils Relative: 1 %
Eosinophils Absolute: 0.1 10*3/uL (ref 0.0–0.5)
Eosinophils Relative: 1 %
HCT: 36.6 % (ref 36.0–46.0)
Hemoglobin: 12.3 g/dL (ref 12.0–15.0)
Immature Granulocytes: 0 %
Lymphocytes Relative: 22 %
Lymphs Abs: 1.5 10*3/uL (ref 0.7–4.0)
MCH: 31.8 pg (ref 26.0–34.0)
MCHC: 33.6 g/dL (ref 30.0–36.0)
MCV: 94.6 fL (ref 80.0–100.0)
Monocytes Absolute: 0.6 10*3/uL (ref 0.1–1.0)
Monocytes Relative: 8 %
Neutro Abs: 4.7 10*3/uL (ref 1.7–7.7)
Neutrophils Relative %: 68 %
Platelets: 181 10*3/uL (ref 150–400)
RBC: 3.87 MIL/uL (ref 3.87–5.11)
RDW: 13.8 % (ref 11.5–15.5)
WBC: 6.9 10*3/uL (ref 4.0–10.5)
nRBC: 0 % (ref 0.0–0.2)

## 2020-10-13 LAB — COMPREHENSIVE METABOLIC PANEL
ALT: 16 U/L (ref 0–44)
AST: 17 U/L (ref 15–41)
Albumin: 3.5 g/dL (ref 3.5–5.0)
Alkaline Phosphatase: 109 U/L (ref 38–126)
Anion gap: 9 (ref 5–15)
BUN: 16 mg/dL (ref 8–23)
CO2: 25 mmol/L (ref 22–32)
Calcium: 9 mg/dL (ref 8.9–10.3)
Chloride: 104 mmol/L (ref 98–111)
Creatinine, Ser: 1.38 mg/dL — ABNORMAL HIGH (ref 0.44–1.00)
GFR, Estimated: 40 mL/min — ABNORMAL LOW (ref >60)
Glucose, Bld: 116 mg/dL — ABNORMAL HIGH (ref 70–99)
Potassium: 3.5 mmol/L (ref 3.5–5.1)
Sodium: 138 mmol/L (ref 135–145)
Total Bilirubin: 0.5 mg/dL (ref 0.3–1.2)
Total Protein: 7 g/dL (ref 6.5–8.1)

## 2020-10-13 LAB — LACTATE DEHYDROGENASE: LDH: 128 U/L (ref 98–192)

## 2020-10-13 MED ORDER — METHYLPREDNISOLONE 4 MG PO TBPK
ORAL_TABLET | ORAL | 1 refills | Status: DC
Start: 2020-10-13 — End: 2021-04-13

## 2020-10-13 MED ORDER — HEPARIN SOD (PORK) LOCK FLUSH 100 UNIT/ML IV SOLN
INTRAVENOUS | Status: AC
  Filled 2020-10-13: qty 5

## 2020-10-13 MED ORDER — MONTELUKAST SODIUM 10 MG PO TABS: 10.0000 mg | ORAL_TABLET | Freq: Every day | ORAL | 0 refills | Status: AC

## 2020-10-13 MED ORDER — SODIUM CHLORIDE 0.9% FLUSH
10.0000 mL | INTRAVENOUS | Status: DC | PRN
  Administered 2020-10-13: 10 mL via INTRAVENOUS
  Filled 2020-10-13: qty 10

## 2020-10-13 MED ORDER — HEPARIN SOD (PORK) LOCK FLUSH 100 UNIT/ML IV SOLN
500.0000 [IU] | Freq: Once | INTRAVENOUS | Status: AC
  Administered 2020-10-13: 500 [IU] via INTRAVENOUS
  Filled 2020-10-13: qty 5
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
## 2020-10-20 ENCOUNTER — Encounter: Payer: Self-pay | Admitting: *Deleted

## 2020-10-20 ENCOUNTER — Telehealth: Payer: Self-pay | Admitting: *Deleted

## 2020-10-20 NOTE — Telephone Encounter (Signed)
Patient left a voice mail on triage line late Friday that the medicine she started last week has broke her out in a rash and she is asking that medicine be sent to pharmacy for it. Please advise

## 2020-10-20 NOTE — Telephone Encounter (Signed)
Dr. Jacinto Reap - please advise. Patient now as a rash.  Your last note: Allergic bronchitis- medrol dose pack [discussed re: prednisone allergy]; pt will call if she develops any rash

## 2020-10-20 NOTE — Telephone Encounter (Signed)
Spoke with Dr. Rogue Bussing.  Patient can try OTC benadryl to help with the rash. I spoke with patient. She is feeling better and rash is slightly improving. She wants to try Claritin since she doesn't have any money at this time for benadryl.  She will get her grandson to pick up benadryl if Claritin does not help. Patient will call us back if rash does not improve.

## 2021-01-12 ENCOUNTER — Other Ambulatory Visit: Payer: Self-pay

## 2021-01-12 ENCOUNTER — Inpatient Hospital Stay: Payer: Medicare (Managed Care) | Attending: Internal Medicine

## 2021-01-12 DIAGNOSIS — C8253 Diffuse follicle center lymphoma, intra-abdominal lymph nodes: Secondary | ICD-10-CM | POA: Diagnosis present

## 2021-01-12 DIAGNOSIS — Z452 Encounter for adjustment and management of vascular access device: Secondary | ICD-10-CM | POA: Diagnosis present

## 2021-01-12 DIAGNOSIS — Z95828 Presence of other vascular implants and grafts: Secondary | ICD-10-CM

## 2021-01-12 MED ORDER — HEPARIN SOD (PORK) LOCK FLUSH 100 UNIT/ML IV SOLN
INTRAVENOUS | Status: AC
  Filled 2021-01-12: qty 5

## 2021-01-12 MED ORDER — HEPARIN SOD (PORK) LOCK FLUSH 100 UNIT/ML IV SOLN
500.0000 [IU] | Freq: Once | INTRAVENOUS | Status: AC
  Administered 2021-01-12: 500 [IU] via INTRAVENOUS
  Filled 2021-01-12: qty 5

## 2021-01-12 MED ORDER — SODIUM CHLORIDE 0.9% FLUSH
10.0000 mL | INTRAVENOUS | Status: DC | PRN
Start: 2021-01-12 — End: 2021-01-12
  Administered 2021-01-12: 10 mL via INTRAVENOUS
  Filled 2021-01-12: qty 10

## 2021-03-01 ENCOUNTER — Emergency Department: Payer: Medicare Other

## 2021-03-01 ENCOUNTER — Emergency Department
Admission: EM | Admit: 2021-03-01 | Discharge: 2021-03-01 | Disposition: A | Discharge status: Home / Self Care | Payer: Medicare Other | Attending: Emergency Medicine | Admitting: Emergency Medicine

## 2021-03-01 ENCOUNTER — Other Ambulatory Visit: Payer: Self-pay

## 2021-03-01 ENCOUNTER — Encounter: Payer: Self-pay | Admitting: Physician Assistant

## 2021-03-01 DIAGNOSIS — W19XXXA Unspecified fall, initial encounter: Secondary | ICD-10-CM

## 2021-03-01 DIAGNOSIS — S0990XA Unspecified injury of head, initial encounter: Secondary | ICD-10-CM | POA: Diagnosis not present

## 2021-03-01 DIAGNOSIS — W01198A Fall on same level from slipping, tripping and stumbling with subsequent striking against other object, initial encounter: Secondary | ICD-10-CM | POA: Diagnosis not present

## 2021-03-01 DIAGNOSIS — Z7982 Long term (current) use of aspirin: Secondary | ICD-10-CM | POA: Insufficient documentation

## 2021-03-01 DIAGNOSIS — Z7901 Long term (current) use of anticoagulants: Secondary | ICD-10-CM | POA: Diagnosis not present

## 2021-03-01 DIAGNOSIS — J45901 Unspecified asthma with (acute) exacerbation: Secondary | ICD-10-CM | POA: Diagnosis not present

## 2021-03-01 DIAGNOSIS — Z79899 Other long term (current) drug therapy: Secondary | ICD-10-CM | POA: Diagnosis not present

## 2021-03-01 DIAGNOSIS — Y92009 Unspecified place in unspecified non-institutional (private) residence as the place of occurrence of the external cause: Secondary | ICD-10-CM | POA: Diagnosis not present

## 2021-03-01 DIAGNOSIS — Z8572 Personal history of non-Hodgkin lymphomas: Secondary | ICD-10-CM | POA: Insufficient documentation

## 2021-03-01 DIAGNOSIS — Z8616 Personal history of COVID-19: Secondary | ICD-10-CM | POA: Insufficient documentation

## 2021-03-01 DIAGNOSIS — I129 Hypertensive chronic kidney disease with stage 1 through stage 4 chronic kidney disease, or unspecified chronic kidney disease: Secondary | ICD-10-CM | POA: Insufficient documentation

## 2021-03-01 DIAGNOSIS — S8001XA Contusion of right knee, initial encounter: Secondary | ICD-10-CM

## 2021-03-01 DIAGNOSIS — N183 Chronic kidney disease, stage 3 unspecified: Secondary | ICD-10-CM | POA: Diagnosis not present

## 2021-03-01 DIAGNOSIS — Z7951 Long term (current) use of inhaled steroids: Secondary | ICD-10-CM | POA: Insufficient documentation

## 2021-03-01 DIAGNOSIS — S8991XA Unspecified injury of right lower leg, initial encounter: Secondary | ICD-10-CM | POA: Diagnosis present

## 2021-03-01 MED ORDER — ACETAMINOPHEN 325 MG PO TABS
650.0000 mg | ORAL_TABLET | Freq: Once | ORAL | Status: AC
  Administered 2021-03-01: 650 mg via ORAL
  Filled 2021-03-01: qty 2

## 2021-03-01 NOTE — ED Triage Notes (Signed)
Pt here with a fall while getting ready for church this morning. Pt lives alone and tripped and hurt her left knee and hit her head. Pt denies any other symptoms.

## 2021-03-01 NOTE — ED Notes (Signed)
Pt taken for scans 

## 2021-03-01 NOTE — Discharge Instructions (Addendum)
Your exam, CT, and x-rays are all normal and reassuring at this time.  You have some mild arthritis noted to your right knee.  Tylenol as needed for joint pain.

## 2021-03-01 NOTE — ED Provider Notes (Signed)
[facility] Emergency Department Provider Note ____________________________________________  Time seen: 1133  I have reviewed the triage vital signs and the nursing notes.  HISTORY  Chief Complaint  Fall   HPI Erin [name] is a 75 y.o. female presents to the ED via EMS from home, where she sustained a mechanical fall.  Patient reports she tripped and fell hitting her right knee as well as hitting her head but denies any loss of consciousness, nausea, vomiting, or dizziness.  She presents to the ED for evaluation of her complaints.  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 03/17/2020   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 01/17/2020   Urge incontinence 01/17/2020   Pneumonia due to COVID-19 virus 06/27/2019   Respiratory failure with hypoxia ([place]) 06/27/2019   Chronic anticoagulation 06/27/2019   CKD (chronic kidney disease) stage 3, GFR 30-59 ml/min ([place]) 06/27/2019   Sepsis ([place]) 10/01/2018   Asthma exacerbation [id number]   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[place]) 02/18/2016   Disease of liver 12/18/2014   Acute URI 11/07/2014   BP (high blood pressure) 05/09/2014   Chronic hepatitis B virus infection ([place]) 03/27/2011   Lymphoma ([place]) 10/27/2007    Past Surgical History:  Procedure Laterality Date   ANTERIOR AND  POSTERIOR REPAIR N/A 03/17/2020   Procedure: ANTERIOR (CYSTOCELE) AND POSTERIOR REPAIR (RECTOCELE);  Surgeon: Rubie Maid, MD;  Location: [facility] ORS;  Service: Gynecology;  Laterality: N/A;   CHOLECYSTECTOMY     COLONOSCOPY  11/23/2007, 05/12/1994   COLONOSCOPY WITH PROPOFOL N/A 03/31/2018   Procedure: COLONOSCOPY WITH PROPOFOL;  Surgeon: Manya Silvas, MD;  Location: [facility] ENDOSCOPY;  Service: Endoscopy;  Laterality: N/A;   ESOPHAGOGASTRODUODENOSCOPY  09/03/2009, 10/14/2008, 09/30/2008, 11/23/2007   HEMORRHOID SURGERY     LAPAROSCOPIC VAGINAL HYSTERECTOMY WITH SALPINGO OOPHORECTOMY Bilateral 03/17/2020   Procedure: LAPAROSCOPIC ASSISTED VAGINAL HYSTERECTOMY WITH SALPINGO OOPHORECTOMY;  Surgeon: Rubie Maid, MD;  Location: [facility] ORS;  Service: Gynecology;  Laterality: Bilateral;   LIVER BIOPSY  01/27/1996   PORTOCAVAL SHUNT PLACEMENT     and removal   PUBOVAGINAL SLING N/A 03/17/2020   Procedure: PUBO-VAGINAL SLING-TVT;  Surgeon: Rubie Maid, MD;  Location: [facility] ORS;  Service: Gynecology;  Laterality: N/A;    Prior to Admission medications   Medication Sig Start Date End Date Taking? Authorizing Provider  acetaminophen (TYLENOL) 325 MG tablet Take 325-650 mg by mouth every 6 (six) hours as needed for moderate pain or headache.    [provider]  albuterol (PROVENTIL HFA;VENTOLIN HFA) 108 (90 Base) MCG/ACT inhaler Inhale 1-2 puffs into the lungs every 6 (six) hours as needed for wheezing or shortness of breath. Patient taking differently: Inhale 2 puffs into the lungs every 4 (four) hours as needed  for wheezing or shortness of breath. 10/08/17   Salary, Avel Peace, MD  albuterol (PROVENTIL) (2.5 MG/3ML) 0.083% nebulizer solution Take 2.5 mg by nebulization every 6 (six) hours as needed for wheezing or shortness of breath.    [provider]  alendronate (FOSAMAX) 70 MG tablet Take 70 mg by mouth every Friday.  03/26/15   [provider]  aspirin 81 MG chewable tablet Chew  81 mg by mouth daily.    [provider]  Biotin 5 MG CAPS Take 5 mg by mouth daily.    [provider]  Calcium Carb-Cholecalciferol (CALCIUM 600 + D PO) Take 1 tablet by mouth daily.    [provider]  Carboxymethylcellulose Sodium (LUBRICANT EYE DROPS OP) Place 1 drop into both eyes daily as needed (dry eyes).    [provider]  citalopram (CELEXA) 40 MG tablet Take 40 mg by mouth every morning.  04/30/19   [provider]  Cyanocobalamin (B-12) 2500 MCG TABS Take 2,500 mcg by mouth daily.    [provider]  diphenhydrAMINE (BENADRYL) 25 MG tablet Take 25 mg by mouth at bedtime.     [provider]  docusate sodium (COLACE) 100 MG capsule Take 1 capsule (100 mg total) by mouth 2 (two) times daily as needed. 03/18/20   Rubie Maid, MD  entecavir (BARACLUDE) 0.5 MG tablet Take 0.5 mg by mouth every morning.     [provider]  ferrous gluconate (FERGON) 240 (27 FE) MG tablet Take 240 mg by mouth daily.    [provider]  ferrous sulfate (FERROUSUL) 325 (65 FE) MG tablet Take 1 tablet (325 mg total) by mouth daily with breakfast. 03/18/20   Rubie Maid, MD  furosemide (LASIX) 20 MG tablet Take 20 mg by mouth daily. 07/22/18   [provider]  gabapentin (NEURONTIN) 600 MG tablet Take 600 mg by mouth 3 (three) times daily.  12/26/18   [provider]  methylPREDNISolone (MEDROL DOSEPAK) 4 MG TBPK tablet Use as directed. 10/13/20   Cammie Sickle, MD  mometasone-formoterol (DULERA) 200-5 MCG/ACT AERO Inhale 2 puffs into the lungs 2 (two) times daily. 10/05/18   Gladstone Lighter, MD  montelukast (SINGULAIR) 10 MG tablet Take 1 tablet (10 mg total) by mouth at bedtime. 10/13/20   Cammie Sickle, MD  Multiple Vitamin (MULTIVITAMIN WITH MINERALS) TABS tablet Take 1 tablet by mouth daily.    [provider]  omeprazole (PRILOSEC) 20 MG capsule Take 20 mg by mouth 2 (two) times daily.      [provider]  potassium chloride SA (KLOR-CON) 20 MEQ tablet Take 1 tablet (20 mEq total) by mouth daily. 06/16/20   Cammie Sickle, MD  simethicone (GAS-X) 80 MG chewable tablet Chew 1 tablet (80 mg total) by mouth 4 (four) times daily as needed for flatulence. 03/18/20 03/18/21  Rubie Maid, MD  tiZANidine (ZANAFLEX) 2 MG tablet Take 2 mg by mouth 3 (three) times daily as needed for muscle spasms.     [provider]  traMADol (ULTRAM) 50 MG tablet Take 1 tablet (50 mg total) by mouth every 6 (six) hours as needed for moderate pain (May take up to 2 tabletes every 6 hours for severe pain). 04/10/20   Rubie Maid, MD  warfarin (COUMADIN) 1 MG tablet TAKE ONE TABLET BY MOUTH EVERY DAY AT 6PM Patient taking differently: Take 1 mg by mouth daily. 06/29/17   Cammie Sickle, MD    Allergies Penicillins, Prednisone,  Sulfa antibiotics, Enalapril, and Medrol [methylprednisolone]  Family History  Problem Relation Age of Onset   Cancer Daughter    Asthma Mother    Breast cancer Neg Hx     Social History Social History   Tobacco Use   Smoking status: Never   Smokeless tobacco: Never  Vaping Use   Vaping Use: Never used  Substance Use Topics   Alcohol use: No   Drug use: Never    Review of Systems  Constitutional: Negative for fever. Eyes: Negative for visual changes. ENT: Negative for sore throat. Cardiovascular: Negative for chest pain. Respiratory: Negative for shortness of breath. Gastrointestinal: Negative for abdominal pain, vomiting and diarrhea. Genitourinary: Negative for dysuria. Musculoskeletal: Negative for back pain. Reports right hip/knee pain Skin: Negative for rash. Neurological: Negative for headaches, focal weakness or numbness. ____________________________________________  PHYSICAL EXAM:  VITAL SIGNS: ED Triage Vitals [03/01/21 0929]  Enc Vitals Group     BP 106/66     Pulse Rate 87     Resp 18     Temp 98.3 F (36.8  C)     Temp Source Oral     SpO2 96 %     Weight 185 lb (83.9 kg)     Height '5\' 4"'$  (1.626 m)     Head Circumference      Peak Flow      Pain Score 5     Pain Loc      Pain Edu?      Excl. in [place]?     Constitutional: Alert and oriented. Well appearing and in no distress. Head: Normocephalic and atraumatic. Eyes: Conjunctivae are normal. Normal extraocular movements Neck: Supple. Normal ROM Cardiovascular: Normal rate, regular rhythm. Normal distal pulses. Respiratory: Normal respiratory effort. No wheezes/rales/rhonchi. Gastrointestinal: Soft and nontender. No distention. Musculoskeletal: Nontender with normal range of motion in all extremities.  Neurologic:  Normal gait without ataxia. Normal speech and language. No gross focal neurologic deficits are appreciated. Skin:  Skin is warm, dry and intact. Multiple erythematous bug bites to the lower extremities. Psychiatric: Mood and affect are normal. Patient exhibits appropriate insight and judgment. ____________________________________________    {LABS (pertinent positives/negatives)  ____________________________________________  {EKG  ____________________________________________   RADIOLOGY Official radiology report(s): CT HEAD WO CONTRAST (5MM)  Result Date: 03/01/2021 CLINICAL DATA:  Recent fall, head trauma EXAM: CT HEAD WITHOUT CONTRAST TECHNIQUE: Contiguous axial images were obtained from the base of the skull through the vertex without intravenous contrast. COMPARISON:  None. FINDINGS: Brain: Mild age related brain atrophy. No acute intracranial hemorrhage, mass lesion, acute infarction, hydrocephalus, herniation, or extra-axial collection. No focal mass or edema. Cisterns patent. Cerebellar atrophy as well. Vascular: No hyperdense vessel or unexpected calcification. Skull: Normal. Negative for fracture or focal lesion. Sinuses/Orbits: No acute finding. Other: None. IMPRESSION: Mild atrophy pattern. No acute intracranial  abnormality by noncontrast imaging. Electronically Signed   By: Jerilynn Mages.  Shick M.D.   On: 03/01/2021 10:36   DG Knee Complete 4 Views Right  Result Date: 03/01/2021 CLINICAL DATA:  Fall EXAM: RIGHT KNEE - COMPLETE 4+ VIEW COMPARISON:  None. FINDINGS: There is tricompartment degenerative change of the right knee, with mild-to-moderate joint space narrowing. There is a trace joint effusion. IMPRESSION: No evidence of acute fracture. 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 Trace joint effusion. Electronically Signed   By: Maurine Simmering M.D.   On: 03/01/2021 10:10   DG Hip Unilat  With Pelvis 2-3 Views Right  Result Date: 03/01/2021 CLINICAL DATA:  Fall  EXAM: DG HIP (WITH OR WITHOUT PELVIS) 2-3V RIGHT COMPARISON:  None. FINDINGS: There is no evidence of acute fracture. There is mild bilateral hip degenerative change. There are moderate findings of osteitis pubis. Bilateral SI joint degenerative change. Lower lumbar spine degenerative changes. IMPRESSION: No evidence of acute hip fracture. Electronically Signed   By: Maurine Simmering M.D.   On: 03/01/2021 10:11   ____________________________________________  PROCEDURES  Tylenol 650 mg PO  Procedures ____________________________________________   INITIAL IMPRESSION / ASSESSMENT AND PLAN / ED COURSE  As part of my medical decision making, I reviewed the following data within the [place] reviewed WNL and Notes from prior ED visits     Geriatric patient ED evaluation of injury sustained following mechanical fall.  Patient was evaluated for complaints in the ED, found to have a negative and reassuring head CT as well as negative plain films of the hips and right knee.  She is stable at this time without signs of any other serious neuromuscular deficits or close head injury.  Should be discharged with instruction to take Tylenol as needed for pain.  Follow-up with primary provider or return to the ED  if needed.    Erin [name] was evaluated in Emergency Department on 03/01/2021 for the symptoms described in the history of present illness. She was evaluated in the context of the global COVID-19 pandemic, which necessitated consideration that the patient might be at risk for infection with the SARS-CoV-2 virus that causes COVID-19. Institutional protocols and algorithms that pertain to the evaluation of patients at risk for COVID-19 are in a state of rapid change based on information released by regulatory bodies including the CDC and federal and state organizations. These policies and algorithms were followed during the patient's care in the ED. ____________________________________________  FINAL CLINICAL IMPRESSION(S) / ED DIAGNOSES  Final diagnoses:  Fall in home, initial encounter  Contusion of right knee, initial encounter  Minor head injury, initial encounter      Melvenia Needles, PA-C 03/01/21 1147    Blake Divine, MD 03/01/21 1559

## 2021-04-13 ENCOUNTER — Encounter: Payer: Self-pay | Admitting: Internal Medicine

## 2021-04-13 ENCOUNTER — Inpatient Hospital Stay (HOSPITAL_BASED_OUTPATIENT_CLINIC_OR_DEPARTMENT_OTHER): Payer: Medicare Other | Admitting: Internal Medicine

## 2021-04-13 ENCOUNTER — Inpatient Hospital Stay: Payer: Medicare Other | Attending: Internal Medicine

## 2021-04-13 ENCOUNTER — Other Ambulatory Visit: Payer: Self-pay

## 2021-04-13 VITALS — BP 120/77 | HR 82 | Temp 97.7°F | Resp 18 | Wt 181.0 lb

## 2021-04-13 DIAGNOSIS — I129 Hypertensive chronic kidney disease with stage 1 through stage 4 chronic kidney disease, or unspecified chronic kidney disease: Secondary | ICD-10-CM | POA: Insufficient documentation

## 2021-04-13 DIAGNOSIS — C8330 Diffuse large B-cell lymphoma, unspecified site: Secondary | ICD-10-CM | POA: Insufficient documentation

## 2021-04-13 DIAGNOSIS — C8253 Diffuse follicle center lymphoma, intra-abdominal lymph nodes: Secondary | ICD-10-CM

## 2021-04-13 DIAGNOSIS — Z95828 Presence of other vascular implants and grafts: Secondary | ICD-10-CM

## 2021-04-13 DIAGNOSIS — M549 Dorsalgia, unspecified: Secondary | ICD-10-CM | POA: Insufficient documentation

## 2021-04-13 DIAGNOSIS — D649 Anemia, unspecified: Secondary | ICD-10-CM | POA: Insufficient documentation

## 2021-04-13 DIAGNOSIS — Z7901 Long term (current) use of anticoagulants: Secondary | ICD-10-CM | POA: Diagnosis not present

## 2021-04-13 DIAGNOSIS — N183 Chronic kidney disease, stage 3 unspecified: Secondary | ICD-10-CM | POA: Insufficient documentation

## 2021-04-13 LAB — COMPREHENSIVE METABOLIC PANEL
ALT: 15 U/L (ref 0–44)
AST: 20 U/L (ref 15–41)
Albumin: 4 g/dL (ref 3.5–5.0)
Alkaline Phosphatase: 77 U/L (ref 38–126)
Anion gap: 9 (ref 5–15)
BUN: 18 mg/dL (ref 8–23)
CO2: 27 mmol/L (ref 22–32)
Calcium: 9.1 mg/dL (ref 8.9–10.3)
Chloride: 100 mmol/L (ref 98–111)
Creatinine, Ser: 1.45 mg/dL — ABNORMAL HIGH (ref 0.44–1.00)
GFR, Estimated: 38 mL/min — ABNORMAL LOW (ref >60)
Glucose, Bld: 91 mg/dL (ref 70–99)
Potassium: 3.4 mmol/L — ABNORMAL LOW (ref 3.5–5.1)
Sodium: 136 mmol/L (ref 135–145)
Total Bilirubin: 0.2 mg/dL — ABNORMAL LOW (ref 0.3–1.2)
Total Protein: 7.3 g/dL (ref 6.5–8.1)

## 2021-04-13 LAB — CBC WITH DIFFERENTIAL/PLATELET
Abs Immature Granulocytes: 0.01 10*3/uL (ref 0.00–0.07)
Basophils Absolute: 0 10*3/uL (ref 0.0–0.1)
Basophils Relative: 1 %
Eosinophils Absolute: 0.1 10*3/uL (ref 0.0–0.5)
Eosinophils Relative: 2 %
HCT: 39.7 % (ref 36.0–46.0)
Hemoglobin: 13.2 g/dL (ref 12.0–15.0)
Immature Granulocytes: 0 %
Lymphocytes Relative: 34 %
Lymphs Abs: 1.8 10*3/uL (ref 0.7–4.0)
MCH: 31.7 pg (ref 26.0–34.0)
MCHC: 33.2 g/dL (ref 30.0–36.0)
MCV: 95.2 fL (ref 80.0–100.0)
Monocytes Absolute: 0.5 10*3/uL (ref 0.1–1.0)
Monocytes Relative: 8 %
Neutro Abs: 3 10*3/uL (ref 1.7–7.7)
Neutrophils Relative %: 55 %
Platelets: 185 10*3/uL (ref 150–400)
RBC: 4.17 MIL/uL (ref 3.87–5.11)
RDW: 13.2 % (ref 11.5–15.5)
WBC: 5.4 10*3/uL (ref 4.0–10.5)
nRBC: 0 % (ref 0.0–0.2)

## 2021-04-13 LAB — LACTATE DEHYDROGENASE: LDH: 124 U/L (ref 98–192)

## 2021-04-13 MED ORDER — HEPARIN SOD (PORK) LOCK FLUSH 100 UNIT/ML IV SOLN
500.0000 [IU] | Freq: Once | INTRAVENOUS | Status: AC
  Administered 2021-04-13: 500 [IU] via INTRAVENOUS
  Filled 2021-04-13: qty 5

## 2021-04-13 MED ORDER — SODIUM CHLORIDE 0.9% FLUSH
10.0000 mL | INTRAVENOUS | Status: DC | PRN
  Administered 2021-04-13: 10 mL via INTRAVENOUS
  Filled 2021-04-13: qty 10
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
## 2021-07-13 ENCOUNTER — Inpatient Hospital Stay: Payer: Medicare Other | Attending: Internal Medicine

## 2021-08-26 ENCOUNTER — Other Ambulatory Visit: Payer: Self-pay | Admitting: Internal Medicine

## 2021-08-26 ENCOUNTER — Other Ambulatory Visit: Payer: Self-pay | Admitting: Family Medicine

## 2021-08-26 DIAGNOSIS — Z1231 Encounter for screening mammogram for malignant neoplasm of breast: Secondary | ICD-10-CM

## 2021-10-02 ENCOUNTER — Ambulatory Visit
Admission: RE | Admit: 2021-10-02 | Discharge: 2021-10-02 | Disposition: A | Discharge status: Home / Self Care | Payer: Medicare Other | Source: Ambulatory Visit | Attending: Internal Medicine | Admitting: Internal Medicine

## 2021-10-02 DIAGNOSIS — Z1231 Encounter for screening mammogram for malignant neoplasm of breast: Secondary | ICD-10-CM | POA: Diagnosis present

## 2021-10-12 ENCOUNTER — Inpatient Hospital Stay: Payer: 59 | Attending: Internal Medicine

## 2021-10-12 ENCOUNTER — Inpatient Hospital Stay (HOSPITAL_BASED_OUTPATIENT_CLINIC_OR_DEPARTMENT_OTHER): Payer: 59 | Admitting: Internal Medicine

## 2021-10-12 ENCOUNTER — Encounter: Payer: Self-pay | Admitting: Internal Medicine

## 2021-10-12 DIAGNOSIS — C8253 Diffuse follicle center lymphoma, intra-abdominal lymph nodes: Secondary | ICD-10-CM | POA: Diagnosis present

## 2021-10-12 DIAGNOSIS — D631 Anemia in chronic kidney disease: Secondary | ICD-10-CM

## 2021-10-12 DIAGNOSIS — D649 Anemia, unspecified: Secondary | ICD-10-CM | POA: Insufficient documentation

## 2021-10-12 DIAGNOSIS — M549 Dorsalgia, unspecified: Secondary | ICD-10-CM | POA: Diagnosis not present

## 2021-10-12 DIAGNOSIS — Z95828 Presence of other vascular implants and grafts: Secondary | ICD-10-CM

## 2021-10-12 DIAGNOSIS — N183 Chronic kidney disease, stage 3 unspecified: Secondary | ICD-10-CM | POA: Diagnosis not present

## 2021-10-12 LAB — COMPREHENSIVE METABOLIC PANEL
ALT: 15 U/L (ref 0–44)
AST: 22 U/L (ref 15–41)
Albumin: 3.7 g/dL (ref 3.5–5.0)
Alkaline Phosphatase: 82 U/L (ref 38–126)
Anion gap: 6 (ref 5–15)
BUN: 17 mg/dL (ref 8–23)
CO2: 28 mmol/L (ref 22–32)
Calcium: 9.1 mg/dL (ref 8.9–10.3)
Chloride: 102 mmol/L (ref 98–111)
Creatinine, Ser: 1.66 mg/dL — ABNORMAL HIGH (ref 0.44–1.00)
GFR, Estimated: 32 mL/min — ABNORMAL LOW (ref >60)
Glucose, Bld: 90 mg/dL (ref 70–99)
Potassium: 3.7 mmol/L (ref 3.5–5.1)
Sodium: 136 mmol/L (ref 135–145)
Total Bilirubin: 0.5 mg/dL (ref 0.3–1.2)
Total Protein: 7.2 g/dL (ref 6.5–8.1)

## 2021-10-12 LAB — CBC WITH DIFFERENTIAL/PLATELET
Abs Immature Granulocytes: 0.02 10*3/uL (ref 0.00–0.07)
Basophils Absolute: 0 10*3/uL (ref 0.0–0.1)
Basophils Relative: 1 %
Eosinophils Absolute: 0.1 10*3/uL (ref 0.0–0.5)
Eosinophils Relative: 2 %
HCT: 41.8 % (ref 36.0–46.0)
Hemoglobin: 13.7 g/dL (ref 12.0–15.0)
Immature Granulocytes: 0 %
Lymphocytes Relative: 29 %
Lymphs Abs: 1.7 10*3/uL (ref 0.7–4.0)
MCH: 31.2 pg (ref 26.0–34.0)
MCHC: 32.8 g/dL (ref 30.0–36.0)
MCV: 95.2 fL (ref 80.0–100.0)
Monocytes Absolute: 0.5 10*3/uL (ref 0.1–1.0)
Monocytes Relative: 9 %
Neutro Abs: 3.3 10*3/uL (ref 1.7–7.7)
Neutrophils Relative %: 59 %
Platelets: 197 10*3/uL (ref 150–400)
RBC: 4.39 MIL/uL (ref 3.87–5.11)
RDW: 13.2 % (ref 11.5–15.5)
WBC: 5.6 10*3/uL (ref 4.0–10.5)
nRBC: 0 % (ref 0.0–0.2)

## 2021-10-12 LAB — LACTATE DEHYDROGENASE: LDH: 114 U/L (ref 98–192)

## 2021-10-12 MED ORDER — SODIUM CHLORIDE 0.9% FLUSH
10.0000 mL | INTRAVENOUS | Status: DC | PRN
Start: 2021-10-12 — End: 2021-10-12
  Administered 2021-10-12: 10 mL via INTRAVENOUS
  Filled 2021-10-12: qty 10

## 2021-10-12 MED ORDER — HEPARIN SOD (PORK) LOCK FLUSH 100 UNIT/ML IV SOLN
500.0000 [IU] | Freq: Once | INTRAVENOUS | Status: AC
Start: 2021-10-12 — End: 2021-10-12
  Administered 2021-10-12: 500 [IU] via INTRAVENOUS
  Filled 2021-10-12: qty 5
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
## 2021-10-16 ENCOUNTER — Emergency Department: Payer: 59

## 2021-10-16 ENCOUNTER — Emergency Department
Admission: EM | Admit: 2021-10-16 | Discharge: 2021-10-16 | Disposition: A | Discharge status: Home / Self Care | Payer: 59 | Attending: Emergency Medicine | Admitting: Emergency Medicine

## 2021-10-16 DIAGNOSIS — K623 Rectal prolapse: Secondary | ICD-10-CM | POA: Diagnosis not present

## 2021-10-16 DIAGNOSIS — R7989 Other specified abnormal findings of blood chemistry: Secondary | ICD-10-CM | POA: Insufficient documentation

## 2021-10-16 DIAGNOSIS — R109 Unspecified abdominal pain: Secondary | ICD-10-CM

## 2021-10-16 DIAGNOSIS — R110 Nausea: Secondary | ICD-10-CM | POA: Insufficient documentation

## 2021-10-16 LAB — CBC
HCT: 42.3 % (ref 36.0–46.0)
Hemoglobin: 13.4 g/dL (ref 12.0–15.0)
MCH: 30.6 pg (ref 26.0–34.0)
MCHC: 31.7 g/dL (ref 30.0–36.0)
MCV: 96.6 fL (ref 80.0–100.0)
Platelets: 166 10*3/uL (ref 150–400)
RBC: 4.38 MIL/uL (ref 3.87–5.11)
RDW: 13.1 % (ref 11.5–15.5)
WBC: 5.1 10*3/uL (ref 4.0–10.5)
nRBC: 0 % (ref 0.0–0.2)

## 2021-10-16 LAB — URINALYSIS, ROUTINE W REFLEX MICROSCOPIC
Bilirubin Urine: NEGATIVE
Glucose, UA: 50 mg/dL — AB
Hgb urine dipstick: NEGATIVE
Ketones, ur: NEGATIVE mg/dL
Leukocytes,Ua: NEGATIVE
Nitrite: NEGATIVE
Protein, ur: NEGATIVE mg/dL
Specific Gravity, Urine: 1.006 (ref 1.005–1.030)
pH: 5 (ref 5.0–8.0)

## 2021-10-16 LAB — BASIC METABOLIC PANEL
Anion gap: 8 (ref 5–15)
BUN: 20 mg/dL (ref 8–23)
CO2: 28 mmol/L (ref 22–32)
Calcium: 9.4 mg/dL (ref 8.9–10.3)
Chloride: 104 mmol/L (ref 98–111)
Creatinine, Ser: 1.75 mg/dL — ABNORMAL HIGH (ref 0.44–1.00)
GFR, Estimated: 30 mL/min — ABNORMAL LOW (ref >60)
Glucose, Bld: 136 mg/dL — ABNORMAL HIGH (ref 70–99)
Potassium: 3.7 mmol/L (ref 3.5–5.1)
Sodium: 140 mmol/L (ref 135–145)

## 2021-10-16 MED ORDER — ONDANSETRON 4 MG PO TBDP: 4.0000 mg | ORAL_TABLET | Freq: Three times a day (TID) | ORAL | 0 refills | Status: AC | PRN

## 2021-10-16 MED ORDER — OXYCODONE-ACETAMINOPHEN 5-325 MG PO TABS: 1.0000 | ORAL_TABLET | Freq: Four times a day (QID) | ORAL | 0 refills | Status: AC | PRN

## 2021-10-16 MED ORDER — OXYCODONE-ACETAMINOPHEN 5-325 MG PO TABS
1.0000 | ORAL_TABLET | Freq: Once | ORAL | Status: AC
  Administered 2021-10-16: 1 via ORAL
  Filled 2021-10-16: qty 1

## 2021-10-16 MED ORDER — ONDANSETRON 4 MG PO TBDP
4.0000 mg | ORAL_TABLET | Freq: Once | ORAL | Status: AC
  Administered 2021-10-16: 4 mg via ORAL
  Filled 2021-10-16: qty 1

## 2021-10-16 NOTE — ED Triage Notes (Signed)
Patient to ER via POV with complaints of left sided flank pain that started around 1630 this afternoon, states the pain radiates into LLQ. Reports she has been drinking a lot of water. Pain worse with movement. ? ?Denies hx of kidney stones. Denies urinary symptoms.  ?

## 2021-10-16 NOTE — ED Notes (Signed)
See triage note. Pt sitting calmly in wheelchair; in NAD.  ?

## 2021-10-16 NOTE — ED Provider Notes (Signed)
? ?[facility] ?Provider Note ? ?Patient Contact: 8:38 PM (approximate) ? ? ?History  ? ?Flank Pain ? ? ?HPI ? ?Erin [name] is a 76 y.o. female presents to the emergency department with left-sided flank pain that started this afternoon and radiates some to the left lower quadrant.  Patient denies experiencing similar pain in the past.  No hematuria or dysuria.  Patient denies history of nephrolithiasis.  She states that she has had some nausea but no vomiting.  No fever at home. ? ?  ? ? ?Physical Exam  ? ?Triage Vital Signs: ?ED Triage Vitals  ?Enc Vitals Group  ?   BP 10/16/21 1850 116/71  ?   Pulse Rate 10/16/21 1850 90  ?   Resp 10/16/21 1850 18  ?   Temp 10/16/21 1850 98.5 ?F (36.9 ?C)  ?   Temp Source 10/16/21 1850 Oral  ?   SpO2 10/16/21 1850 95 %  ?   Weight 10/16/21 1847 183 lb 12.8 oz (83.4 kg)  ?   Height 10/16/21 1847 '5\' 4"'$  (1.626 m)  ?   Head Circumference --   ?   Peak Flow --   ?   Pain Score 10/16/21 1847 7  ?   Pain Loc --   ?   Pain Edu? --   ?   Excl. in [place]? --   ? ? ?Most recent vital signs: ?Vitals:  ? 10/16/21 1850 10/16/21 2159  ?BP: 116/71 114/78  ?Pulse: 90 88  ?Resp: 18 17  ?Temp: 98.5 ?F (36.9 ?C)   ?SpO2: 95% 96%  ? ? ? ?General: Alert and in no acute distress. ?Eyes:  PERRL. EOMI. ?Head: No acute traumatic findings ?ENT: ?     Ears: Tms are pearly.  ?     Nose: No congestion/rhinnorhea. ?     Mouth/Throat: Mucous membranes are moist.  ?Neck: No stridor. No cervical spine tenderness to palpation. ?Cardiovascular:  Good peripheral perfusion ?Respiratory: Normal respiratory effort without tachypnea or retractions. Lungs CTAB. Good air entry to the bases with no decreased or absent breath sounds. ?Gastrointestinal: Bowel sounds ?4 quadrants. Soft and nontender to palpation. No guarding or rigidity. No palpable masses. No distention. No CVA tenderness. Patient has left sided CVA tenderness.  ?Musculoskeletal: Full range of motion to all extremities.  ?Neurologic:   No gross focal neurologic deficits are appreciated.  ?Skin:   No rash noted ?Other: ? ? ?ED Results / Procedures / Treatments  ? ?Labs ?(all labs ordered are listed, but only abnormal results are displayed) ?Labs Reviewed  ?URINALYSIS, ROUTINE W REFLEX MICROSCOPIC - Abnormal; Notable for the following components:  ?    Result Value  ? Color, Urine STRAW (*)   ? APPearance CLEAR (*)   ? Glucose, UA 50 (*)   ? All other components within normal limits  ?BASIC METABOLIC PANEL - Abnormal; Notable for the following components:  ? Glucose, Bld 136 (*)   ? Creatinine, Ser 1.75 (*)   ? GFR, Estimated 30 (*)   ? All other components within normal limits  ?CBC  ? ? ? ? ? ?RADIOLOGY ? ?I personally viewed and evaluated these images as part of my medical decision making, as well as reviewing the written report by the radiologist. ? ?ED Provider Interpretation: Patient has generalized pelvic floor laxity on CT abdomen pelvis and mild rectal prolapse but no other acute findings in the distribution of patient's pain. ? ?PROCEDURES: ? ?Critical Care performed: No ? ?  Procedures ? ? ?MEDICATIONS ORDERED IN ED: ?Medications  ?oxyCODONE-acetaminophen (PERCOCET/ROXICET) 5-325 MG per tablet 1 tablet (1 tablet Oral Given 10/16/21 2048)  ?ondansetron (ZOFRAN-ODT) disintegrating tablet 4 mg (4 mg Oral Given 10/16/21 2049)  ? ? ? ?IMPRESSION / MDM / ASSESSMENT AND PLAN / ED COURSE  ?I reviewed the triage vital signs and the nursing notes. ?             ?               ? ?Assessment and plan:  ? ?Differential diagnosis includes, but is not limited to, nephrolithiasis, pyelonephritis, UTI... ? ?76 year old female presents to the emergency department with left-sided flank pain that started today. ? ?Vital signs are reassuring at triage.  On physical exam, patient was alert, active and nontoxic-appearing and she ambulated easily.  She had reproducible tenderness to palpation on exam. ? ?Urinalysis showed no signs of UTI and CBC showed normal white  blood cell count.  Patient had elevated creatinine at 1.75 and diminished GFR at 30.  CT renal stone study indicated no evidence of nephrolithiasis.  There was some generalized pelvic floor laxity and a mild rectal prolapse but no other acute abnormality in the distribution of patient's pain.  I communicated results to patient's daughter over the phone and was reassured by patient's ability to ambulate easily through emergency department.  She was discharged with a short course of Percocet for pain and return precautions were given to return with new or worsening symptoms. ? ? ?FINAL CLINICAL IMPRESSION(S) / ED DIAGNOSES  ? ?Final diagnoses:  ?Flank pain  ? ? ? ?Rx / DC Orders  ? ?ED Discharge Orders   ? ?      Ordered  ?  oxyCODONE-acetaminophen (PERCOCET/ROXICET) 5-325 MG tablet  Every 6 hours PRN       ? 10/16/21 2139  ?  ondansetron (ZOFRAN-ODT) 4 MG disintegrating tablet  Every 8 hours PRN       ? 10/16/21 2139  ? ?  ?  ? ?  ? ? ? ?Note:  This document was prepared using Dragon voice recognition software and may include unintentional dictation errors. ?  ?Lannie Fields, PA-C ?10/16/21 2238 ? ?  ?Naaman Plummer, MD ?10/17/21 1152 ? ?

## 2021-10-16 NOTE — Discharge Instructions (Signed)
You can take Roxicet as needed for pain.  ?Take Zofran with Roxicet to avoid nausea.  ?

## 2022-01-04 ENCOUNTER — Telehealth: Payer: Self-pay | Admitting: *Deleted

## 2022-01-04 NOTE — Telephone Encounter (Signed)
Patient called requesting details about appointment scheduled for 7/13. Appointment is for port flush, patient aware and all questions answered.

## 2022-01-07 ENCOUNTER — Inpatient Hospital Stay: Payer: 59 | Attending: Internal Medicine

## 2022-01-07 DIAGNOSIS — Z95828 Presence of other vascular implants and grafts: Secondary | ICD-10-CM

## 2022-01-07 DIAGNOSIS — C8253 Diffuse follicle center lymphoma, intra-abdominal lymph nodes: Secondary | ICD-10-CM | POA: Insufficient documentation

## 2022-01-07 DIAGNOSIS — Z452 Encounter for adjustment and management of vascular access device: Secondary | ICD-10-CM | POA: Insufficient documentation

## 2022-01-07 MED ORDER — HEPARIN SOD (PORK) LOCK FLUSH 100 UNIT/ML IV SOLN
500.0000 [IU] | Freq: Once | INTRAVENOUS | Status: AC
Start: 2022-01-07 — End: 2022-01-07
  Administered 2022-01-07: 500 [IU] via INTRAVENOUS
  Filled 2022-01-07: qty 5

## 2022-01-07 MED ORDER — SODIUM CHLORIDE 0.9% FLUSH
10.0000 mL | INTRAVENOUS | Status: DC | PRN
  Administered 2022-01-07: 10 mL via INTRAVENOUS
  Filled 2022-01-07: qty 10

## 2022-03-19 IMAGING — US US PELVIS COMPLETE
1 series · 14 of 17 positions shown · non-contrast
Comparison: None.

CLINICAL DATA: Vaginal bleeding. Status post hysterectomy and
bilateral oophorectomy.

EXAM:
TRANSABDOMINAL ULTRASOUND OF PELVIS
TECHNIQUE: Transabdominal ultrasound examination of the pelvis was performed
including evaluation of the uterus, ovaries, adnexal regions, and
pelvic cul-de-sac. Transvaginal sonography was deferred by the
patient.

[Series 1: us pelvis (transabdominal only) · 14 of 17 slices shown]
[im 1/17]
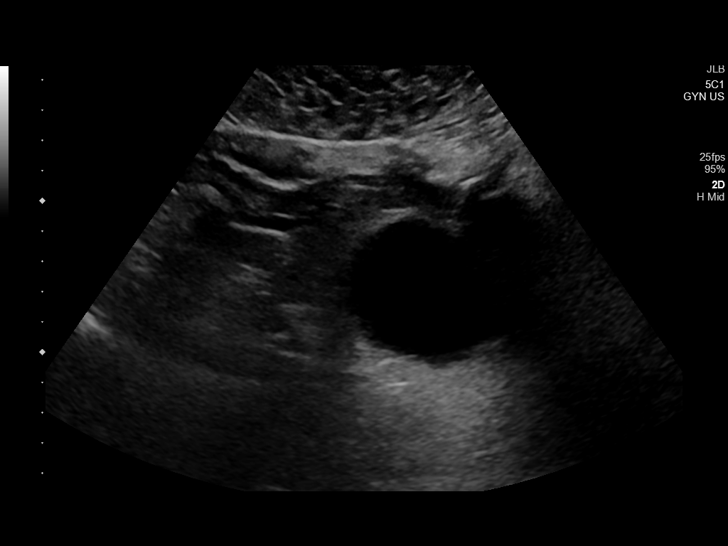
[im 2/17]
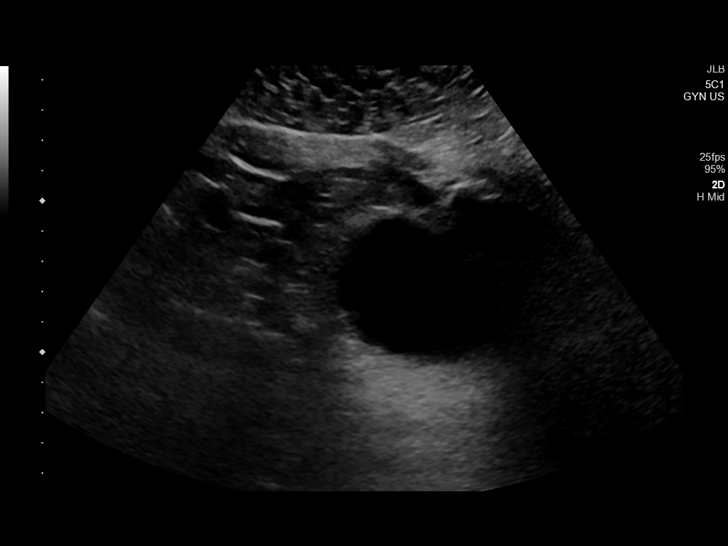
[im 4/17]
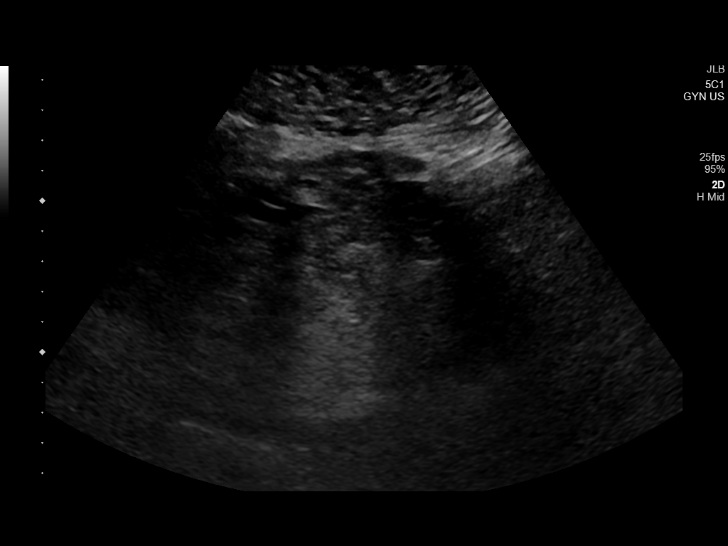
[im 5/17]
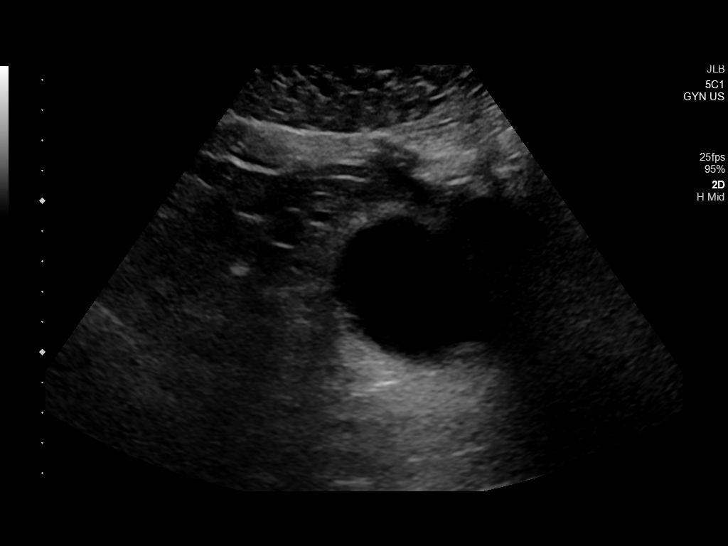
[im 6/17]
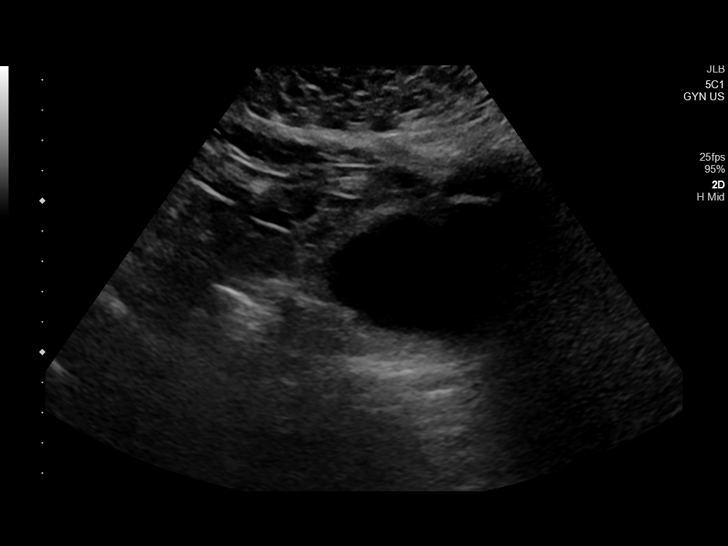
[im 7/17]
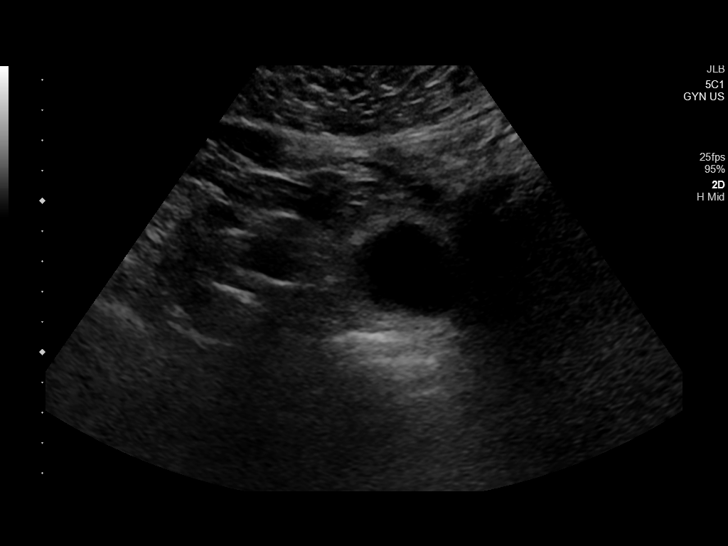
[im 8/17]
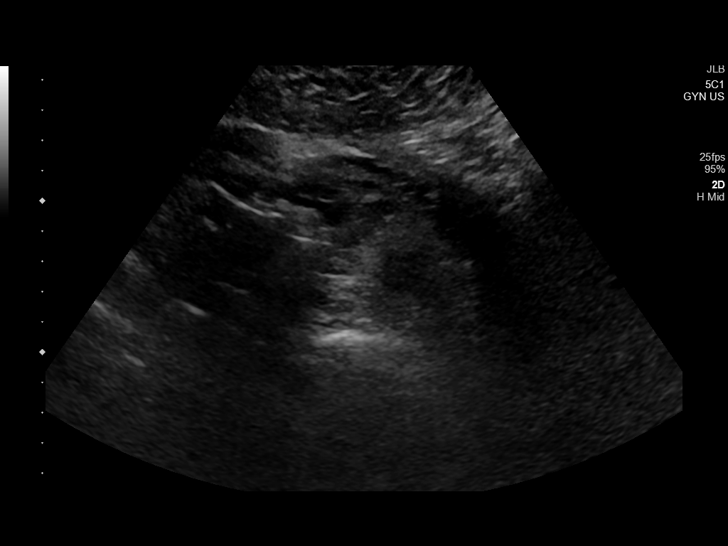
[im 10/17]
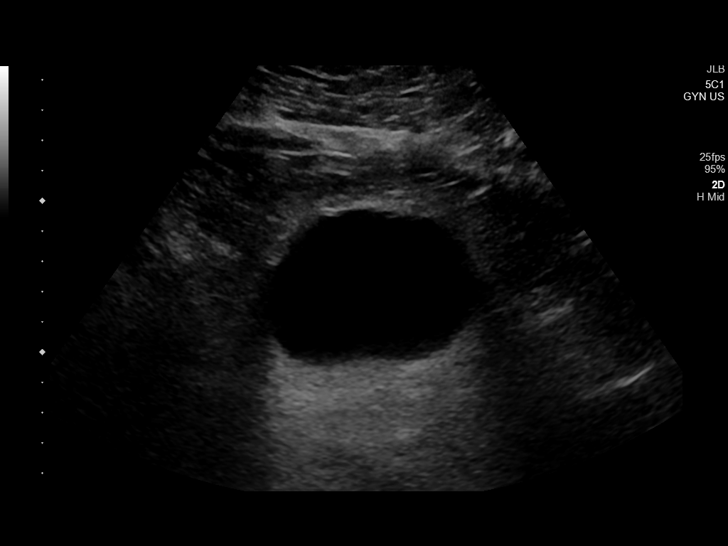
[im 11/17]
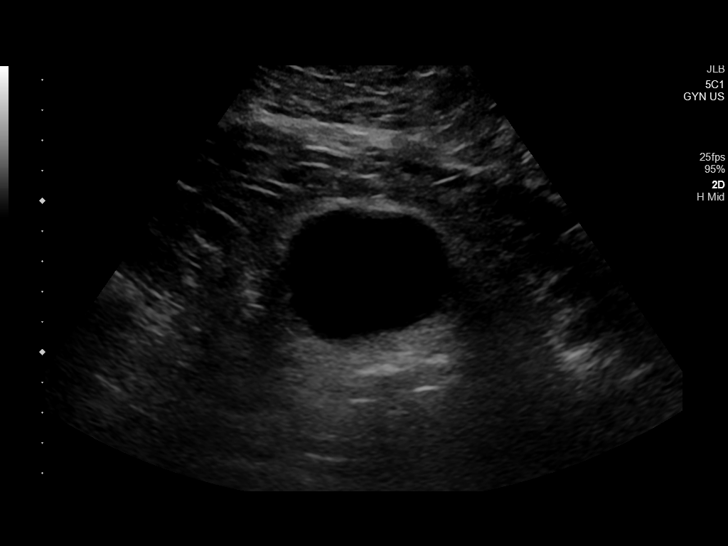
[im 12/17]
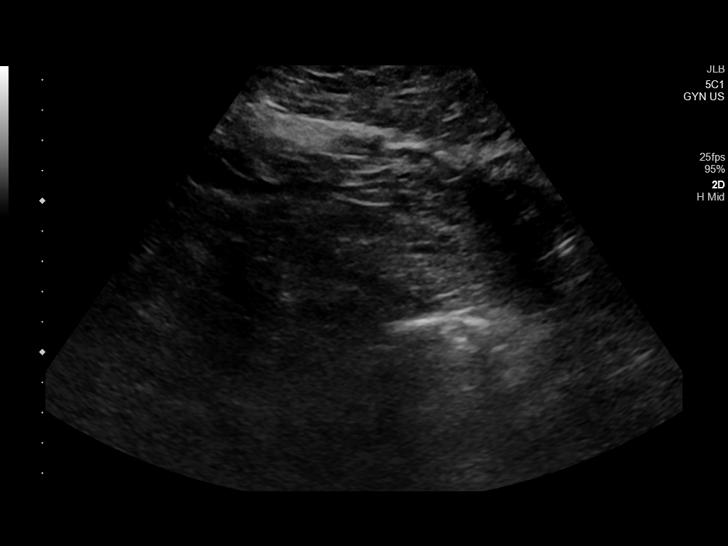
[im 13/17]
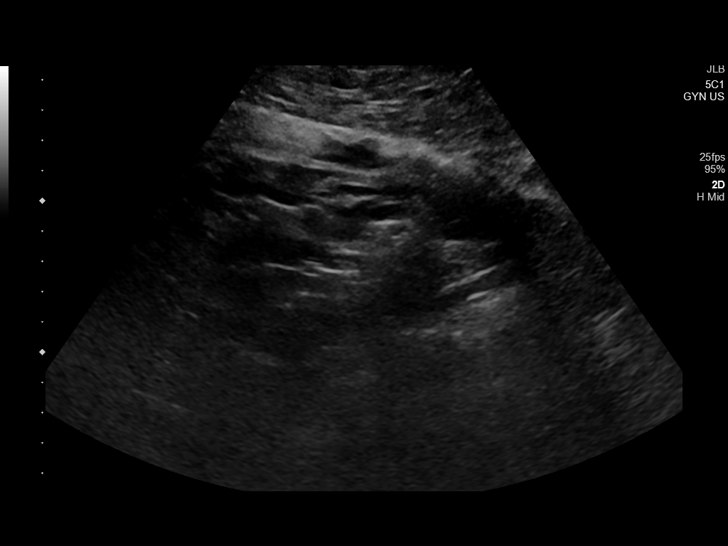
[im 14/17]
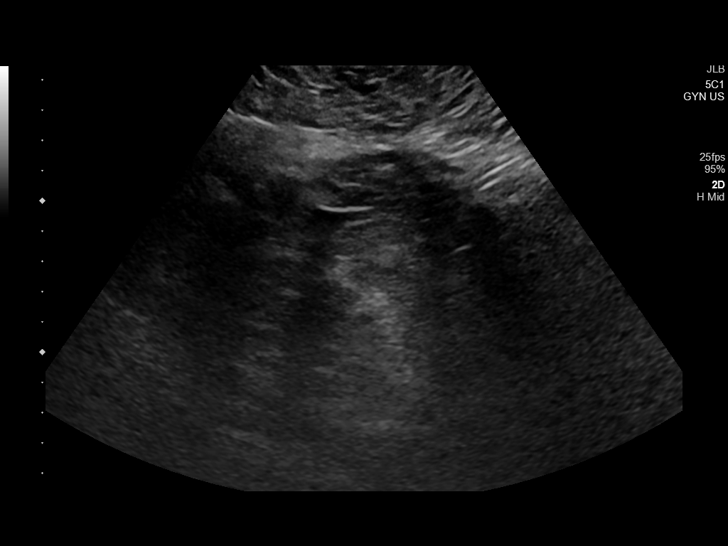
[im 16/17]
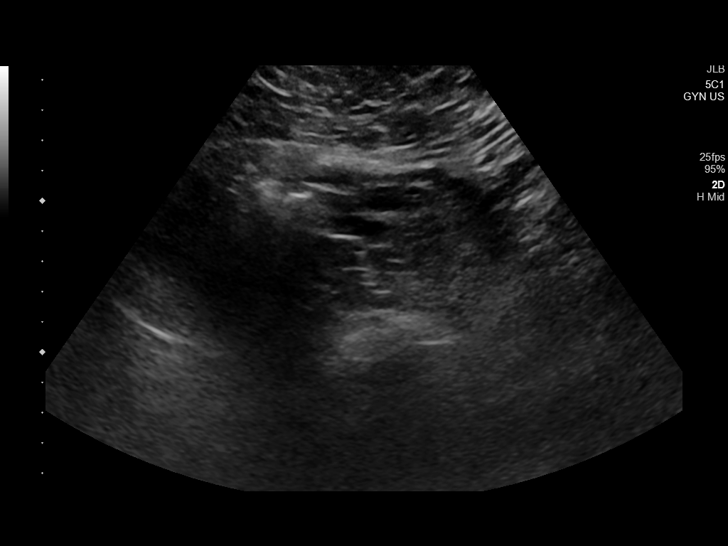
[im 17/17]
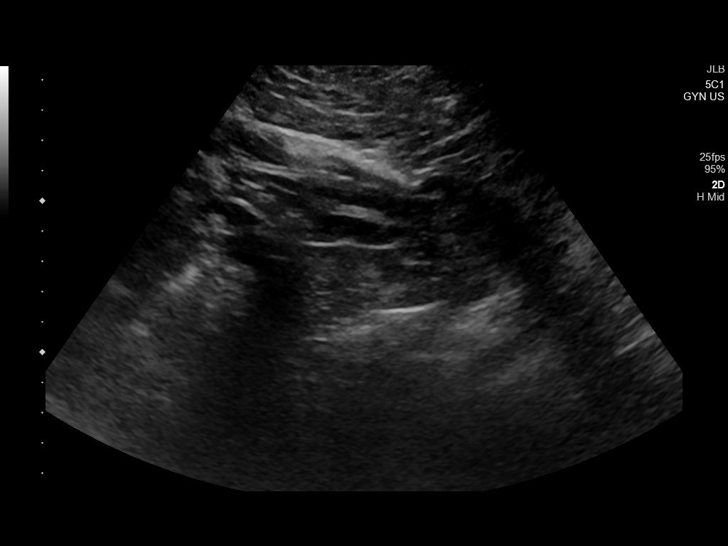

[14 of 17 positions shown; findings below may reference images not displayed]

FINDINGS: Uterus

Absent

Endometrium

Not applicable

Right ovary

Absent.  No adnexal masses visualized

Left ovary

Absent.  No adnexal masses visualized

Other findings:  No abnormal free fluid.
IMPRESSION: Unremarkable postsurgical examination of the pelvis.

## 2022-04-13 ENCOUNTER — Ambulatory Visit: Payer: 59 | Admitting: Internal Medicine

## 2022-04-13 ENCOUNTER — Other Ambulatory Visit: Payer: 59

## 2022-04-30 ENCOUNTER — Other Ambulatory Visit: Payer: Self-pay

## 2022-04-30 ENCOUNTER — Ambulatory Visit: Payer: Self-pay | Admitting: Internal Medicine

## 2022-05-03 ENCOUNTER — Inpatient Hospital Stay (HOSPITAL_BASED_OUTPATIENT_CLINIC_OR_DEPARTMENT_OTHER): Payer: Medicare Other | Admitting: Medical Oncology

## 2022-05-03 ENCOUNTER — Inpatient Hospital Stay: Payer: Medicare Other | Attending: Medical Oncology

## 2022-05-03 ENCOUNTER — Encounter: Payer: Self-pay | Admitting: Medical Oncology

## 2022-05-03 VITALS — BP 111/68 | HR 83 | Temp 97.3°F | Wt 179.0 lb

## 2022-05-03 DIAGNOSIS — Z7901 Long term (current) use of anticoagulants: Secondary | ICD-10-CM

## 2022-05-03 DIAGNOSIS — I129 Hypertensive chronic kidney disease with stage 1 through stage 4 chronic kidney disease, or unspecified chronic kidney disease: Secondary | ICD-10-CM | POA: Insufficient documentation

## 2022-05-03 DIAGNOSIS — N183 Chronic kidney disease, stage 3 unspecified: Secondary | ICD-10-CM | POA: Diagnosis not present

## 2022-05-03 DIAGNOSIS — Z95828 Presence of other vascular implants and grafts: Secondary | ICD-10-CM | POA: Diagnosis not present

## 2022-05-03 DIAGNOSIS — C8338 Diffuse large B-cell lymphoma, lymph nodes of multiple sites: Secondary | ICD-10-CM | POA: Diagnosis not present

## 2022-05-03 DIAGNOSIS — C8253 Diffuse follicle center lymphoma, intra-abdominal lymph nodes: Secondary | ICD-10-CM | POA: Diagnosis not present

## 2022-05-03 LAB — COMPREHENSIVE METABOLIC PANEL
ALT: 13 U/L (ref 0–44)
AST: 23 U/L (ref 15–41)
Albumin: 3.8 g/dL (ref 3.5–5.0)
Alkaline Phosphatase: 73 U/L (ref 38–126)
Anion gap: 7 (ref 5–15)
BUN: 17 mg/dL (ref 8–23)
CO2: 28 mmol/L (ref 22–32)
Calcium: 9.2 mg/dL (ref 8.9–10.3)
Chloride: 103 mmol/L (ref 98–111)
Creatinine, Ser: 1.51 mg/dL — ABNORMAL HIGH (ref 0.44–1.00)
GFR, Estimated: 36 mL/min — ABNORMAL LOW (ref >60)
Glucose, Bld: 126 mg/dL — ABNORMAL HIGH (ref 70–99)
Potassium: 3.8 mmol/L (ref 3.5–5.1)
Sodium: 138 mmol/L (ref 135–145)
Total Bilirubin: 0.3 mg/dL (ref 0.3–1.2)
Total Protein: 7.2 g/dL (ref 6.5–8.1)

## 2022-05-03 LAB — CBC WITH DIFFERENTIAL/PLATELET
Abs Immature Granulocytes: 0.01 10*3/uL (ref 0.00–0.07)
Basophils Absolute: 0 10*3/uL (ref 0.0–0.1)
Basophils Relative: 1 %
Eosinophils Absolute: 0.1 10*3/uL (ref 0.0–0.5)
Eosinophils Relative: 3 %
HCT: 41.6 % (ref 36.0–46.0)
Hemoglobin: 13.6 g/dL (ref 12.0–15.0)
Immature Granulocytes: 0 %
Lymphocytes Relative: 28 %
Lymphs Abs: 1.4 10*3/uL (ref 0.7–4.0)
MCH: 31.3 pg (ref 26.0–34.0)
MCHC: 32.7 g/dL (ref 30.0–36.0)
MCV: 95.6 fL (ref 80.0–100.0)
Monocytes Absolute: 0.4 10*3/uL (ref 0.1–1.0)
Monocytes Relative: 8 %
Neutro Abs: 3 10*3/uL (ref 1.7–7.7)
Neutrophils Relative %: 60 %
Platelets: 175 10*3/uL (ref 150–400)
RBC: 4.35 MIL/uL (ref 3.87–5.11)
RDW: 13 % (ref 11.5–15.5)
WBC: 5.1 10*3/uL (ref 4.0–10.5)
nRBC: 0 % (ref 0.0–0.2)

## 2022-05-03 LAB — LACTATE DEHYDROGENASE: LDH: 111 U/L (ref 98–192)

## 2022-05-03 MED ORDER — SODIUM CHLORIDE 0.9% FLUSH
10.0000 mL | Freq: Once | INTRAVENOUS | Status: AC
  Administered 2022-05-03: 10 mL via INTRAVENOUS
  Filled 2022-05-03: qty 10

## 2022-05-03 MED ORDER — HEPARIN SOD (PORK) LOCK FLUSH 100 UNIT/ML IV SOLN
500.0000 [IU] | Freq: Once | INTRAVENOUS | Status: AC
  Administered 2022-05-03: 500 [IU] via INTRAVENOUS
  Filled 2022-05-03: qty 5
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
## 2022-08-03 ENCOUNTER — Inpatient Hospital Stay: Payer: 59 | Attending: Medical Oncology

## 2022-08-03 DIAGNOSIS — D649 Anemia, unspecified: Secondary | ICD-10-CM | POA: Insufficient documentation

## 2022-08-03 DIAGNOSIS — C8338 Diffuse large B-cell lymphoma, lymph nodes of multiple sites: Secondary | ICD-10-CM | POA: Insufficient documentation

## 2022-08-03 DIAGNOSIS — Z95828 Presence of other vascular implants and grafts: Secondary | ICD-10-CM

## 2022-08-03 MED ORDER — SODIUM CHLORIDE 0.9% FLUSH
10.0000 mL | Freq: Once | INTRAVENOUS | Status: AC
  Administered 2022-08-03: 10 mL via INTRAVENOUS
  Filled 2022-08-03: qty 10

## 2022-08-03 MED ORDER — HEPARIN SOD (PORK) LOCK FLUSH 100 UNIT/ML IV SOLN
500.0000 [IU] | Freq: Once | INTRAVENOUS | Status: AC
  Administered 2022-08-03: 500 [IU] via INTRAVENOUS
  Filled 2022-08-03: qty 5

## 2022-09-14 ENCOUNTER — Other Ambulatory Visit: Payer: Self-pay | Admitting: Family Medicine

## 2022-09-14 DIAGNOSIS — Z1231 Encounter for screening mammogram for malignant neoplasm of breast: Secondary | ICD-10-CM

## 2022-10-07 ENCOUNTER — Ambulatory Visit
Admission: RE | Admit: 2022-10-07 | Discharge: 2022-10-07 | Disposition: A | Discharge status: Home / Self Care | Payer: 59 | Source: Ambulatory Visit | Attending: Family Medicine | Admitting: Family Medicine

## 2022-10-07 DIAGNOSIS — Z1231 Encounter for screening mammogram for malignant neoplasm of breast: Secondary | ICD-10-CM | POA: Diagnosis present

## 2022-10-29 ENCOUNTER — Other Ambulatory Visit: Payer: Self-pay | Admitting: *Deleted

## 2022-10-29 DIAGNOSIS — C8253 Diffuse follicle center lymphoma, intra-abdominal lymph nodes: Secondary | ICD-10-CM

## 2022-11-01 ENCOUNTER — Inpatient Hospital Stay: Payer: 59 | Admitting: Internal Medicine

## 2022-11-01 ENCOUNTER — Encounter: Payer: Self-pay | Admitting: Internal Medicine

## 2022-11-01 ENCOUNTER — Inpatient Hospital Stay: Payer: 59 | Attending: Medical Oncology

## 2022-11-01 DIAGNOSIS — D649 Anemia, unspecified: Secondary | ICD-10-CM | POA: Insufficient documentation

## 2022-11-01 DIAGNOSIS — N183 Chronic kidney disease, stage 3 unspecified: Secondary | ICD-10-CM | POA: Insufficient documentation

## 2022-11-01 DIAGNOSIS — M549 Dorsalgia, unspecified: Secondary | ICD-10-CM | POA: Insufficient documentation

## 2022-11-01 DIAGNOSIS — C8338 Diffuse large B-cell lymphoma, lymph nodes of multiple sites: Secondary | ICD-10-CM | POA: Insufficient documentation
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
## 2022-11-01 NOTE — Progress Notes (Deleted)
[place] OFFICE PROGRESS NOTE  Patient Care Team: Creola Corn, DO as PCP - General (Family Medicine)   SUMMARY OF ONCOLOGIC HISTORY: Oncology History Overview Note  # MAY 2009- DLBCL [Bx- laprscopic] s/p R-CHOP x8 [finished Oct 2009]  # FEB 2015- Recurrent DLBCL [right inguinal LN Bx-; PET- Left supra-renal LN; BMBx-neg] s/p RICE x3 [Dr.Pandit]; excellent Response; not Transplant candidate  [sec to poor social support]; s/p RT to right Inguinal LN; CT OCT 2016- NED.   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  Complaints of gait instability.  Fell.  Was i evaluated in the emergency room-CT brain negative.  There is no nausea no vomiting.  Review of Systems  Constitutional:  Negative for chills, diaphoresis, fever, malaise/fatigue and weight loss.  HENT:  Negative for nosebleeds and sore throat.   Eyes:  Negative for double vision.  Respiratory:  Negative for cough, hemoptysis, sputum production, shortness of breath and wheezing.   Cardiovascular:  Positive for leg swelling. Negative for chest pain, palpitations and orthopnea.  Gastrointestinal:  Negative for abdominal pain, blood in stool, constipation, diarrhea, heartburn, melena, nausea and vomiting.  Genitourinary:  Negative for dysuria, frequency and urgency.  Musculoskeletal:  Positive for back pain, joint pain and neck pain.  Neurological:  Negative for dizziness, tingling, focal weakness, weakness and headaches.  Endo/Heme/Allergies:  Does not bruise/bleed easily.  Psychiatric/Behavioral:  Negative for depression. The patient is not nervous/anxious and does not have insomnia.      PAST MEDICAL HISTORY :  Past Medical History:  Diagnosis Date  . Acute URI   . Allergy    . Anxiety   . Asthma    well controlled  . Cancer ([place])    lymphoma in remission  . Diverticulosis   . DVT of upper extremity (deep vein thrombosis) ([place]) 07/2008 & 12/2007  . Gastric ulcer 10/2007   EGD  . GERD (gastroesophageal reflux disease)   . Hepatitis B    treated  . History of hiatal hernia   . Hypertension   . Laboratory confirmed diagnosis of COVID-19 06/27/2019  . Lower extremity edema   . Lymphoma ([place])    Stage 3 large B cell lymphoma    PAST SURGICAL HISTORY :   Past Surgical History:  Procedure Laterality Date  . ANTERIOR AND POSTERIOR REPAIR N/A 03/17/2020   Procedure: ANTERIOR (CYSTOCELE) AND POSTERIOR REPAIR (RECTOCELE);  Surgeon: Rubie Maid, MD;  Location: [facility] ORS;  Service: Gynecology;  Laterality: N/A;  . CHOLECYSTECTOMY    . COLONOSCOPY  11/23/2007, 05/12/1994  . COLONOSCOPY WITH PROPOFOL N/A 03/31/2018   Procedure: COLONOSCOPY WITH PROPOFOL;  Surgeon: Manya Silvas, MD;  Location[facility] ENDOSCOPY;  Service: Endoscopy;  Laterality: N/A;  . ESOPHAGOGASTRODUODENOSCOPY  09/03/2009, 10/14/2008, 09/30/2008, 11/23/2007  . HEMORRHOID SURGERY    . LAPAROSCOPIC VAGINAL HYSTERECTOMY WITH SALPINGO OOPHORECTOMY Bilateral 03/17/2020   Procedure: LAPAROSCOPIC ASSISTED VAGINAL HYSTERECTOMY WITH SALPINGO OOPHORECTOMY;  Surgeon: Rubie Maid, MD;  Location: [facility] ORS;  Service: Gynecology;  Laterality: Bilateral;  . LIVER BIOPSY  01/27/1996  . PORTOCAVAL SHUNT PLACEMENT     and removal  . PUBOVAGINAL SLING N/A 03/17/2020   Procedure: PUBO-VAGINAL SLING-TVT;  Surgeon: Rubie Maid, MD;  Location: [facility] ORS;  Service: Gynecology;  Laterality: N/A;    FAMILY  HISTORY :   Family History  Problem Relation Age of Onset  . Cancer Daughter   . Asthma Mother   . Breast cancer Neg Hx     SOCIAL HISTORY:   Social History   Tobacco Use  . Smoking status: Never  . Smokeless tobacco: Never  Vaping Use  . Vaping Use: Never used  Substance Use Topics  . Alcohol use: No  .  Drug use: Never    ALLERGIES:  is allergic to penicillins, prednisone, sulfa antibiotics, enalapril, and medrol [methylprednisolone].  MEDICATIONS:  Current Outpatient Medications  Medication Sig Dispense Refill  . acetaminophen (TYLENOL) 325 MG tablet Take 325-650 mg by mouth every 6 (six) hours as needed for moderate pain or headache.    . albuterol (PROVENTIL HFA;VENTOLIN HFA) 108 (90 Base) MCG/ACT inhaler Inhale 1-2 puffs into the lungs every 6 (six) hours as needed for wheezing or shortness of breath. (Patient taking differently: Inhale 2 puffs into the lungs every 4 (four) hours as needed for wheezing or shortness of breath.) 1 Inhaler 0  . albuterol (PROVENTIL) (2.5 MG/3ML) 0.083% nebulizer solution Take 2.5 mg by nebulization every 6 (six) hours as needed for wheezing or shortness of breath.    Marland Kitchen alendronate (FOSAMAX) 70 MG tablet Take 70 mg by mouth every Friday.     Marland Kitchen aspirin 81 MG chewable tablet Chew 81 mg by mouth daily.    . Biotin 5 MG CAPS Take 5 mg by mouth daily.    . Calcium Carb-Cholecalciferol (CALCIUM 600 + D PO) Take 1 tablet by mouth daily.    . Carboxymethylcellulose Sodium (LUBRICANT EYE DROPS OP) Place 1 drop into both eyes daily as needed (dry eyes).    . citalopram (CELEXA) 40 MG tablet Take 40 mg by mouth every morning.     . Cyanocobalamin (B-12) 2500 MCG TABS Take 2,500 mcg by mouth daily.    . diphenhydrAMINE (BENADRYL) 25 MG tablet Take 25 mg by mouth at bedtime.     . docusate sodium (COLACE) 100 MG capsule Take 1 capsule (100 mg total) by mouth 2 (two) times daily as needed. 30 capsule 2  . entecavir (BARACLUDE) 0.5 MG tablet Take 0.5 mg by mouth every morning.     . ferrous sulfate (FERROUSUL) 325 (65 FE) MG tablet Take 1 tablet (325 mg total) by mouth daily with breakfast. 60 tablet 1  . furosemide (LASIX) 20 MG tablet Take 20 mg by mouth daily.    Marland Kitchen gabapentin (NEURONTIN) 600 MG tablet Take 600 mg by mouth 3 (three) times daily.     .  mometasone-formoterol (DULERA) 200-5 MCG/ACT AERO Inhale 2 puffs into the lungs 2 (two) times daily. 1 Inhaler 1  . montelukast (SINGULAIR) 10 MG tablet Take 1 tablet (10 mg total) by mouth at bedtime. 15 tablet 0  . Multiple Vitamin (MULTIVITAMIN WITH MINERALS) TABS tablet Take 1 tablet by mouth daily.    Marland Kitchen omeprazole (PRILOSEC) 20 MG capsule Take 20 mg by mouth 2 (two) times daily.   2  . potassium chloride SA (KLOR-CON) 20 MEQ tablet Take 1 tablet (20 mEq total) by mouth daily. 7 tablet 0  . simethicone (GAS-X) 80 MG chewable tablet Chew 1 tablet (80 mg total) by mouth 4 (four) times daily as needed for flatulence. 60 tablet 1  . tiZANidine (ZANAFLEX) 2 MG tablet Take 2 mg by mouth 3 (three) times daily as needed for muscle spasms.     . traMADol (ULTRAM) 50 MG tablet Take 1  tablet (50 mg total) by mouth every 6 (six) hours as needed for moderate pain (May take up to 2 tabletes every 6 hours for severe pain). 30 tablet 0  . warfarin (COUMADIN) 1 MG tablet TAKE ONE TABLET BY MOUTH EVERY DAY AT 6PM (Patient taking differently: Take 1 mg by mouth daily.) 90 tablet 0   No current facility-administered medications for this visit.   Facility-Administered Medications Ordered in Other Visits  Medication Dose Route Frequency Provider Last Rate Last Admin  . sodium chloride 0.9 % injection 10 mL  10 mL Intravenous PRN Choksi, Valarie Cones, MD   10 mL at 12/18/14 1121    PHYSICAL EXAMINATION: ECOG PERFORMANCE STATUS: 0 - Asymptomatic  There were no vitals taken for this visit.  There were no vitals filed for this visit.   Physical Exam Constitutional:      Comments: Alone.  Ambulating independently.  HENT:     Head: Normocephalic and atraumatic.     Mouth/Throat:     Pharynx: No oropharyngeal exudate.  Eyes:     Pupils: Pupils are equal, round, and reactive to light.  Cardiovascular:     Rate and Rhythm: Normal rate and regular rhythm.  Pulmonary:     Effort: No respiratory distress.      Breath sounds: No wheezing.     Comments: Scattered wheezing bilaterally.  No crackles. Abdominal:     General: Bowel sounds are normal. There is no distension.     Palpations: Abdomen is soft. There is no mass.     Tenderness: There is no abdominal tenderness. There is no guarding or rebound.  Musculoskeletal:        General: No tenderness. Normal range of motion.     Cervical back: Normal range of motion and neck supple.  Skin:    General: Skin is warm.  Neurological:     Mental Status: She is alert and oriented to person, place, and time.  Psychiatric:        Mood and Affect: Affect normal.    LABORATORY DATA:  I have reviewed the data as listed    Component Value Date/Time   NA 138 05/03/2022 0930   NA 142 11/20/2013 0842   K 3.8 05/03/2022 0930   K 4.0 11/20/2013 0842   CL 103 05/03/2022 0930   CL 105 11/20/2013 0842   CO2 28 05/03/2022 0930   CO2 30 11/20/2013 0842   GLUCOSE 126 (H) 05/03/2022 0930   GLUCOSE 94 11/20/2013 0842   BUN 17 05/03/2022 0930   BUN 12 11/20/2013 0842   CREATININE 1.51 (H) 05/03/2022 0930   CREATININE 1.06 05/07/2014 0937   CALCIUM 9.2 05/03/2022 0930   CALCIUM 8.3 (L) 11/20/2013 0842   PROT 7.2 05/03/2022 0930   PROT 6.9 05/07/2014 0937   ALBUMIN 3.8 05/03/2022 0930   ALBUMIN 3.5 05/07/2014 0937   AST 23 05/03/2022 0930   AST 20 05/07/2014 0937   ALT 13 05/03/2022 0930   ALT 22 05/07/2014 0937   ALKPHOS 73 05/03/2022 0930   ALKPHOS 143 (H) 05/07/2014 0937   BILITOT 0.3 05/03/2022 0930   BILITOT 0.3 05/07/2014 0937   GFRNONAA 36 (L) 05/03/2022 0930   GFRNONAA 55 (L) 05/07/2014 0937   GFRNONAA 50 (L) 02/12/2014 1104   GFRAA 43 (L) 03/20/2020 1537   GFRAA >60 05/07/2014 0937   GFRAA 58 (L) 02/12/2014 1104    No results found for: "SPEP", "UPEP"  Lab Results  Component Value Date   WBC 5.1 05/03/2022  NEUTROABS 3.0 05/03/2022   HGB 13.6 05/03/2022   HCT 41.6 05/03/2022   MCV 95.6 05/03/2022   PLT 175 05/03/2022       Chemistry      Component Value Date/Time   NA 138 05/03/2022 0930   NA 142 11/20/2013 0842   K 3.8 05/03/2022 0930   K 4.0 11/20/2013 0842   CL 103 05/03/2022 0930   CL 105 11/20/2013 0842   CO2 28 05/03/2022 0930   CO2 30 11/20/2013 0842   BUN 17 05/03/2022 0930   BUN 12 11/20/2013 0842   CREATININE 1.51 (H) 05/03/2022 0930   CREATININE 1.06 05/07/2014 0937      Component Value Date/Time   CALCIUM 9.2 05/03/2022 0930   CALCIUM 8.3 (L) 11/20/2013 0842   ALKPHOS 73 05/03/2022 0930   ALKPHOS 143 (H) 05/07/2014 0937   AST 23 05/03/2022 0930   AST 20 05/07/2014 0937   ALT 13 05/03/2022 0930   ALT 22 05/07/2014 0937   BILITOT 0.3 05/03/2022 0930   BILITOT 0.3 05/07/2014 0092        ASSESSMENT & PLAN:   No problem-specific Assessment & Plan notes found for this encounter.     Earna Coder, MD 11/01/2022 12:55 PM

## 2022-11-06 ENCOUNTER — Emergency Department
Admission: EM | Admit: 2022-11-06 | Discharge: 2022-11-06 | Disposition: A | Discharge status: Home / Self Care | Payer: 59 | Attending: Emergency Medicine | Admitting: Emergency Medicine

## 2022-11-06 ENCOUNTER — Emergency Department: Payer: 59

## 2022-11-06 ENCOUNTER — Other Ambulatory Visit: Payer: Self-pay

## 2022-11-06 DIAGNOSIS — Z8572 Personal history of non-Hodgkin lymphomas: Secondary | ICD-10-CM | POA: Diagnosis not present

## 2022-11-06 DIAGNOSIS — I129 Hypertensive chronic kidney disease with stage 1 through stage 4 chronic kidney disease, or unspecified chronic kidney disease: Secondary | ICD-10-CM | POA: Insufficient documentation

## 2022-11-06 DIAGNOSIS — J45909 Unspecified asthma, uncomplicated: Secondary | ICD-10-CM | POA: Diagnosis not present

## 2022-11-06 DIAGNOSIS — R519 Headache, unspecified: Secondary | ICD-10-CM | POA: Insufficient documentation

## 2022-11-06 DIAGNOSIS — N183 Chronic kidney disease, stage 3 unspecified: Secondary | ICD-10-CM | POA: Diagnosis not present

## 2022-11-06 DIAGNOSIS — R1030 Lower abdominal pain, unspecified: Secondary | ICD-10-CM | POA: Diagnosis present

## 2022-11-06 DIAGNOSIS — K5900 Constipation, unspecified: Secondary | ICD-10-CM | POA: Diagnosis not present

## 2022-11-06 LAB — COMPREHENSIVE METABOLIC PANEL
ALT: 16 U/L (ref 0–44)
AST: 24 U/L (ref 15–41)
Albumin: 4.2 g/dL (ref 3.5–5.0)
Alkaline Phosphatase: 80 U/L (ref 38–126)
Anion gap: 8 (ref 5–15)
BUN: 19 mg/dL (ref 8–23)
CO2: 26 mmol/L (ref 22–32)
Calcium: 9.4 mg/dL (ref 8.9–10.3)
Chloride: 104 mmol/L (ref 98–111)
Creatinine, Ser: 1.68 mg/dL — ABNORMAL HIGH (ref 0.44–1.00)
GFR, Estimated: 32 mL/min — ABNORMAL LOW (ref >60)
Glucose, Bld: 110 mg/dL — ABNORMAL HIGH (ref 70–99)
Potassium: 3.5 mmol/L (ref 3.5–5.1)
Sodium: 138 mmol/L (ref 135–145)
Total Bilirubin: 0.6 mg/dL (ref 0.3–1.2)
Total Protein: 7.5 g/dL (ref 6.5–8.1)

## 2022-11-06 LAB — URINALYSIS, ROUTINE W REFLEX MICROSCOPIC
Bilirubin Urine: NEGATIVE
Glucose, UA: NEGATIVE mg/dL
Ketones, ur: NEGATIVE mg/dL
Leukocytes,Ua: NEGATIVE
Nitrite: NEGATIVE
Protein, ur: 30 mg/dL — AB
Specific Gravity, Urine: 1.02 (ref 1.005–1.030)
pH: 6 (ref 5.0–8.0)

## 2022-11-06 LAB — LIPASE, BLOOD: Lipase: 44 U/L (ref 11–51)

## 2022-11-06 LAB — CBC
HCT: 43.7 % (ref 36.0–46.0)
Hemoglobin: 14.3 g/dL (ref 12.0–15.0)
MCH: 31.2 pg (ref 26.0–34.0)
MCHC: 32.7 g/dL (ref 30.0–36.0)
MCV: 95.4 fL (ref 80.0–100.0)
Platelets: 178 10*3/uL (ref 150–400)
RBC: 4.58 MIL/uL (ref 3.87–5.11)
RDW: 13.4 % (ref 11.5–15.5)
WBC: 5.6 10*3/uL (ref 4.0–10.5)
nRBC: 0 % (ref 0.0–0.2)

## 2022-11-06 LAB — TROPONIN I (HIGH SENSITIVITY)
Troponin I (High Sensitivity): 4 ng/L (ref <18)
Troponin I (High Sensitivity): 4 ng/L (ref <18)

## 2022-11-06 LAB — PROTIME-INR
INR: 1 (ref 0.8–1.2)
Prothrombin Time: 12.8 seconds (ref 11.4–15.2)

## 2022-11-06 MED ORDER — POLYETHYLENE GLYCOL 3350 17 G PO PACK: 17.0000 g | PACK | Freq: Two times a day (BID) | ORAL | 0 refills | Status: AC

## 2022-11-06 MED ORDER — LACTATED RINGERS IV BOLUS
1000.0000 mL | Freq: Once | INTRAVENOUS | Status: AC
  Administered 2022-11-06: 1000 mL via INTRAVENOUS

## 2022-11-06 MED ORDER — NITROGLYCERIN 0.4 MG SL SUBL
0.4000 mg | SUBLINGUAL_TABLET | Freq: Once | SUBLINGUAL | Status: AC
  Administered 2022-11-06: 0.4 mg via SUBLINGUAL
  Filled 2022-11-06: qty 1

## 2022-11-06 MED ORDER — DOCUSATE SODIUM 100 MG PO CAPS: 200.0000 mg | ORAL_CAPSULE | Freq: Every day | ORAL | 0 refills | Status: AC

## 2022-11-06 MED ORDER — HYDROMORPHONE HCL 1 MG/ML IJ SOLN
0.5000 mg | Freq: Once | INTRAMUSCULAR | Status: AC
  Administered 2022-11-06: 0.5 mg via INTRAVENOUS
  Filled 2022-11-06: qty 0.5

## 2022-11-06 NOTE — ED Triage Notes (Signed)
Pt reports has not had a BM in 3 days, had a small amount this am but has some abd pain an feels like the stool is built up and stuck. Pt also not able to pass gas.

## 2022-11-06 NOTE — Discharge Instructions (Addendum)
Please start taking the MiraLAX twice a day.  He should also start taking the Colace once a day.  Once you are having regular bowel movements you decrease to the MiraLAX once daily.  It is very important that you follow-up with Dr. Donneta Romberg regarding your blood thinner.
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
## 2022-11-06 NOTE — ED Notes (Addendum)
Patient complains of sudden onset of chest pressure. EKG obtained.

## 2022-11-06 NOTE — ED Provider Triage Note (Signed)
Emergency Medicine Provider Triage Evaluation Note  Erin [name] , a 77 y.o. female  was evaluated in triage.  Pt complains of abdominal pain with concern for constipation.  Takes Milk of mag but no BM for 3 days until this morning and had small one.    Review of Systems  Positive: Abdominal pain and pressure rectal area Negative: Denies fever, chills or exposure to sick contacts  Physical Exam  BP (!) 81/67 (BP Location: Left Arm)   Pulse 95   Temp 98.6 F (37 C) (Oral)   Resp 20   Ht 5\' 3"  (1.6 m)   Wt 81.6 kg   SpO2 95%   BMI 31.89 kg/m  Gen:   Awake, no distress  Talkative and answering questions.  Resp:  Normal effort  MSK:   Moves extremities without difficulty  Other:  Abdomen soft with diffuse tenderness lower abd and LLQ area.   Medical Decision Making  Medically screening exam initiated at 11:39 AM.  Appropriate orders placed.  Erin [name] was informed that the remainder of the evaluation will be completed by another provider, this initial triage assessment does not replace that evaluation, and the importance of remaining in the ED until their evaluation is complete.     Tommi Rumps, PA-C 11/06/22 1142

## 2022-11-08 ENCOUNTER — Other Ambulatory Visit: Payer: Self-pay | Admitting: *Deleted

## 2022-11-08 DIAGNOSIS — C8253 Diffuse follicle center lymphoma, intra-abdominal lymph nodes: Secondary | ICD-10-CM

## 2022-11-08 DIAGNOSIS — Z7901 Long term (current) use of anticoagulants: Secondary | ICD-10-CM

## 2022-11-23 ENCOUNTER — Inpatient Hospital Stay (HOSPITAL_BASED_OUTPATIENT_CLINIC_OR_DEPARTMENT_OTHER): Payer: 59 | Admitting: Internal Medicine

## 2022-11-23 ENCOUNTER — Encounter: Payer: Self-pay | Admitting: Internal Medicine

## 2022-11-23 ENCOUNTER — Inpatient Hospital Stay: Payer: 59

## 2022-11-23 DIAGNOSIS — C8338 Diffuse large B-cell lymphoma, lymph nodes of multiple sites: Secondary | ICD-10-CM | POA: Diagnosis present

## 2022-11-23 DIAGNOSIS — C8253 Diffuse follicle center lymphoma, intra-abdominal lymph nodes: Secondary | ICD-10-CM

## 2022-11-23 DIAGNOSIS — D649 Anemia, unspecified: Secondary | ICD-10-CM | POA: Diagnosis not present

## 2022-11-23 DIAGNOSIS — M549 Dorsalgia, unspecified: Secondary | ICD-10-CM | POA: Diagnosis not present

## 2022-11-23 DIAGNOSIS — Z7901 Long term (current) use of anticoagulants: Secondary | ICD-10-CM

## 2022-11-23 DIAGNOSIS — N183 Chronic kidney disease, stage 3 unspecified: Secondary | ICD-10-CM | POA: Diagnosis not present

## 2022-11-23 LAB — CBC WITH DIFFERENTIAL (CANCER CENTER ONLY)
Abs Immature Granulocytes: 0.02 10*3/uL (ref 0.00–0.07)
Basophils Absolute: 0 10*3/uL (ref 0.0–0.1)
Basophils Relative: 0 %
Eosinophils Absolute: 0.1 10*3/uL (ref 0.0–0.5)
Eosinophils Relative: 2 %
HCT: 41.7 % (ref 36.0–46.0)
Hemoglobin: 14.1 g/dL (ref 12.0–15.0)
Immature Granulocytes: 0 %
Lymphocytes Relative: 35 %
Lymphs Abs: 1.9 10*3/uL (ref 0.7–4.0)
MCH: 31.5 pg (ref 26.0–34.0)
MCHC: 33.8 g/dL (ref 30.0–36.0)
MCV: 93.3 fL (ref 80.0–100.0)
Monocytes Absolute: 0.5 10*3/uL (ref 0.1–1.0)
Monocytes Relative: 8 %
Neutro Abs: 2.9 10*3/uL (ref 1.7–7.7)
Neutrophils Relative %: 55 %
Platelet Count: 174 10*3/uL (ref 150–400)
RBC: 4.47 MIL/uL (ref 3.87–5.11)
RDW: 13 % (ref 11.5–15.5)
WBC Count: 5.4 10*3/uL (ref 4.0–10.5)
nRBC: 0 % (ref 0.0–0.2)

## 2022-11-23 LAB — LACTATE DEHYDROGENASE: LDH: 109 U/L (ref 98–192)

## 2022-11-23 LAB — PROTIME-INR
INR: 0.9 (ref 0.8–1.2)
Prothrombin Time: 12.5 seconds (ref 11.4–15.2)

## 2022-11-23 LAB — CMP (CANCER CENTER ONLY)
ALT: 12 U/L (ref 0–44)
AST: 21 U/L (ref 15–41)
Albumin: 4 g/dL (ref 3.5–5.0)
Alkaline Phosphatase: 79 U/L (ref 38–126)
Anion gap: 9 (ref 5–15)
BUN: 20 mg/dL (ref 8–23)
CO2: 26 mmol/L (ref 22–32)
Calcium: 8.9 mg/dL (ref 8.9–10.3)
Chloride: 101 mmol/L (ref 98–111)
Creatinine: 1.51 mg/dL — ABNORMAL HIGH (ref 0.44–1.00)
GFR, Estimated: 36 mL/min — ABNORMAL LOW (ref >60)
Glucose, Bld: 103 mg/dL — ABNORMAL HIGH (ref 70–99)
Potassium: 3.4 mmol/L — ABNORMAL LOW (ref 3.5–5.1)
Sodium: 136 mmol/L (ref 135–145)
Total Bilirubin: 0.2 mg/dL — ABNORMAL LOW (ref 0.3–1.2)
Total Protein: 7.4 g/dL (ref 6.5–8.1)

## 2022-11-23 MED ORDER — TRAMADOL HCL 50 MG PO TABS: 50.0000 mg | ORAL_TABLET | Freq: Two times a day (BID) | ORAL | 0 refills | Status: DC | PRN

## 2022-11-23 NOTE — Progress Notes (Signed)
C/o neck pain 8/10.  C/o being weak and lightheaded x2-3 week(seen in ED) Overall she states she doesn't feel good anymore.  C/o of rash and rawness in breast ara, states it's wet, using powder.  C/o ringing/noise in ears.
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
## 2022-11-23 NOTE — Addendum Note (Signed)
Addended by: Clydia Llano on: 11/23/2022 04:00 PM   Modules accepted: Orders
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
## 2022-12-06 ENCOUNTER — Other Ambulatory Visit: Payer: Self-pay | Admitting: Orthopedic Surgery

## 2022-12-06 DIAGNOSIS — M4802 Spinal stenosis, cervical region: Secondary | ICD-10-CM

## 2022-12-10 ENCOUNTER — Ambulatory Visit
Admission: RE | Admit: 2022-12-10 | Discharge: 2022-12-10 | Disposition: A | Discharge status: Home / Self Care | Payer: 59 | Source: Ambulatory Visit | Attending: Orthopedic Surgery | Admitting: Orthopedic Surgery

## 2022-12-10 DIAGNOSIS — M4802 Spinal stenosis, cervical region: Secondary | ICD-10-CM
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
## 2023-01-11 ENCOUNTER — Ambulatory Visit (INDEPENDENT_AMBULATORY_CARE_PROVIDER_SITE_OTHER): Payer: 59 | Admitting: Neurosurgery

## 2023-01-11 VITALS — BP 118/70 | Ht 63.0 in | Wt 173.4 lb

## 2023-01-11 DIAGNOSIS — M47812 Spondylosis without myelopathy or radiculopathy, cervical region: Secondary | ICD-10-CM

## 2023-01-11 DIAGNOSIS — M5010 Cervical disc disorder with radiculopathy, unspecified cervical region: Secondary | ICD-10-CM

## 2023-01-11 DIAGNOSIS — M4722 Other spondylosis with radiculopathy, cervical region: Secondary | ICD-10-CM | POA: Diagnosis not present

## 2023-01-11 DIAGNOSIS — M542 Cervicalgia: Secondary | ICD-10-CM

## 2023-01-16 ENCOUNTER — Other Ambulatory Visit: Payer: Self-pay

## 2023-01-16 ENCOUNTER — Emergency Department
Admission: EM | Admit: 2023-01-16 | Discharge: 2023-01-16 | Disposition: A | Discharge status: Home / Self Care | Payer: 59 | Attending: Emergency Medicine | Admitting: Emergency Medicine

## 2023-01-16 ENCOUNTER — Emergency Department: Payer: 59

## 2023-01-16 DIAGNOSIS — W540XXA Bitten by dog, initial encounter: Secondary | ICD-10-CM | POA: Insufficient documentation

## 2023-01-16 DIAGNOSIS — Z23 Encounter for immunization: Secondary | ICD-10-CM | POA: Diagnosis not present

## 2023-01-16 DIAGNOSIS — S71152A Open bite, left thigh, initial encounter: Secondary | ICD-10-CM | POA: Insufficient documentation

## 2023-01-16 DIAGNOSIS — Z7901 Long term (current) use of anticoagulants: Secondary | ICD-10-CM | POA: Diagnosis not present

## 2023-01-16 DIAGNOSIS — N189 Chronic kidney disease, unspecified: Secondary | ICD-10-CM | POA: Diagnosis not present

## 2023-01-16 DIAGNOSIS — J45909 Unspecified asthma, uncomplicated: Secondary | ICD-10-CM | POA: Diagnosis not present

## 2023-01-16 MED ORDER — TETANUS-DIPHTH-ACELL PERTUSSIS 5-2.5-18.5 LF-MCG/0.5 IM SUSY
0.5000 mL | PREFILLED_SYRINGE | Freq: Once | INTRAMUSCULAR | Status: AC
  Administered 2023-01-16: 0.5 mL via INTRAMUSCULAR
  Filled 2023-01-16: qty 0.5

## 2023-01-16 MED ORDER — BACITRACIN ZINC 500 UNIT/GM EX OINT
TOPICAL_OINTMENT | Freq: Once | CUTANEOUS | Status: AC
  Filled 2023-01-16: qty 0.9

## 2023-01-16 MED ORDER — DOXYCYCLINE HYCLATE 100 MG PO CAPS: 100.0000 mg | ORAL_CAPSULE | Freq: Two times a day (BID) | ORAL | 0 refills | Status: DC

## 2023-01-16 MED ORDER — HYDROCODONE-ACETAMINOPHEN 5-325 MG PO TABS
1.0000 | ORAL_TABLET | Freq: Once | ORAL | Status: AC
  Administered 2023-01-16: 1 via ORAL
  Filled 2023-01-16: qty 1

## 2023-01-16 MED ORDER — TRAMADOL HCL 50 MG PO TABS: 50.0000 mg | ORAL_TABLET | Freq: Four times a day (QID) | ORAL | 0 refills | Status: DC | PRN

## 2023-01-16 MED ORDER — DOXYCYCLINE HYCLATE 100 MG PO TABS
100.0000 mg | ORAL_TABLET | Freq: Once | ORAL | Status: AC
  Administered 2023-01-16: 100 mg via ORAL
  Filled 2023-01-16: qty 1

## 2023-01-16 NOTE — ED Provider Notes (Signed)
[facility] Provider Note    Event Date/Time   First MD Initiated Contact with Patient 01/16/23 1755     (approximate)   History   Animal Bite (DOG)   HPI  Erin [name] is a 77 y.o. female with history of hepatitis B, asthma, CKD, chronic anticoagulation on warfarin secondary to DVT and as listed in EMR presents to the emergency department for evaluation of dog bite to the left upper thigh.  Patient states that she was outside in a [place] waiting on her son to come out of the house.  She got hot so she decided that she would go knock on her neighbors door.  She knocked and then just opened the door.  She states that 2 pit bulls ran toward her and 1 bit her leg.  She is unsure of her last tetanus vaccine.  She is unsure of the status of the rabies vaccinations of the dogs.  She has not yet called animal control..      Physical Exam   Triage Vital Signs: ED Triage Vitals  Encounter Vitals Group     BP 01/16/23 1543 125/72     Systolic BP Percentile --      Diastolic BP Percentile --      Pulse Rate 01/16/23 1543 96     Resp 01/16/23 1543 16     Temp 01/16/23 1543 98 F (36.7 C)     Temp Source 01/16/23 1543 Oral     SpO2 01/16/23 1543 98 %     Weight 01/16/23 1544 171 lb 15.3 oz (78 kg)     Height 01/16/23 1544 5\' 3"  (1.6 m)     Head Circumference --      Peak Flow --      Pain Score 01/16/23 1544 8     Pain Loc --      Pain Education --      Exclude from Growth Chart --     Most recent vital signs: Vitals:   01/16/23 1543  BP: 125/72  Pulse: 96  Resp: 16  Temp: 98 F (36.7 C)  SpO2: 98%    General: Awake, no distress.  CV:  Good peripheral perfusion.  Resp:  Normal effort.  Abd:  No distention.  Other:  Wound to the left anterior thigh consistent with report of dog bite is noted.  No lacerations.  Area has scant amount of bleeding and hematoma.   ED Results / Procedures / Treatments   Labs (all labs ordered are listed, but  only abnormal results are displayed) Labs Reviewed - No data to display   EKG  Not indicated   RADIOLOGY  Image and radiology report reviewed and interpreted by me. Radiology report consistent with the same.  X-ray of the left femur is without evidence of retained foreign body.  PROCEDURES:  Critical Care performed: No  Procedures   MEDICATIONS ORDERED IN ED:  Medications  Tdap (BOOSTRIX) injection 0.5 mL (0.5 mLs Intramuscular Given 01/16/23 1900)  doxycycline (VIBRA-TABS) tablet 100 mg (100 mg Oral Given 01/16/23 1859)  HYDROcodone-acetaminophen (NORCO/VICODIN) 5-325 MG per tablet 1 tablet (1 tablet Oral Given 01/16/23 1912)  bacitracin ointment ( Topical Given 01/16/23 2006)     IMPRESSION / MDM / ASSESSMENT AND PLAN / ED COURSE   I have reviewed the triage note.  Differential diagnosis includes, but is not limited to, retained foreign body, dog bite  Patient's presentation is most consistent with acute complicated illness / injury  requiring diagnostic workup.  77 year old female presenting to the emergency department for treatment and evaluation after being bitten by dog.  See HPI for further details.  X-ray shows no retained foreign body.  There are no lacerations that need to be repaired.  Wound care was ordered and completed by nursing staff.  She has an allergy to amoxicillin therefore she will be placed on doxycycline.  Wound care instructions discussed.  She is unsure of her last Tdap and it was given tonight.  Animal control was notified of the incident.  Patient discharged home and instructed to take the antibiotic as prescribed and until finished.  ER return precautions were discussed      FINAL CLINICAL IMPRESSION(S) / ED DIAGNOSES   Final diagnoses:  Dog bite, initial encounter     Rx / DC Orders   ED Discharge Orders          Ordered    doxycycline (VIBRAMYCIN) 100 MG capsule  2 times daily        01/16/23 1956    traMADol (ULTRAM) 50 MG tablet   Every 6 hours PRN        01/16/23 1956             Note:  This document was prepared using Dragon voice recognition software and may include unintentional dictation errors.   Chinita Pester, FNP 01/16/23 2340    Merwyn Katos, MD 01/19/23 [phone]

## 2023-01-16 NOTE — Discharge Instructions (Signed)
Please make sure you speak with Animal Control regarding your dog bite today.  Take all the antibiotics until finished.  Clean the bite daily with soap and water and use antibiotic ointment and a clean bandage.  See your doctor if you think it is getting infected or come back to the ER.

## 2023-01-16 NOTE — ED Triage Notes (Signed)
Pt to ed from home via POV for a dog bite to the left upper thigh. Pt is caox4, in no acute distress and in wheel chair. Pt has large dog bite to her thigh, bleeding controlled at the time. All puncture wounds, no lac and moderate swelling.

## 2023-02-03 ENCOUNTER — Ambulatory Visit: Payer: 59 | Admitting: Student in an Organized Health Care Education/Training Program

## 2023-02-22 ENCOUNTER — Ambulatory Visit: Payer: 59 | Admitting: Student in an Organized Health Care Education/Training Program

## 2023-02-23 ENCOUNTER — Inpatient Hospital Stay: Payer: 59 | Attending: Internal Medicine

## 2023-03-10 ENCOUNTER — Ambulatory Visit
Payer: 59 | Attending: Student in an Organized Health Care Education/Training Program | Admitting: Student in an Organized Health Care Education/Training Program

## 2023-03-10 ENCOUNTER — Encounter: Payer: Self-pay | Admitting: Student in an Organized Health Care Education/Training Program

## 2023-03-10 VITALS — BP 97/62 | HR 85 | Temp 97.3°F | Resp 16 | Ht 63.0 in | Wt 175.0 lb

## 2023-03-10 DIAGNOSIS — G894 Chronic pain syndrome: Secondary | ICD-10-CM | POA: Insufficient documentation

## 2023-03-10 DIAGNOSIS — M47812 Spondylosis without myelopathy or radiculopathy, cervical region: Secondary | ICD-10-CM | POA: Diagnosis not present

## 2023-03-10 DIAGNOSIS — M5481 Occipital neuralgia: Secondary | ICD-10-CM | POA: Diagnosis present

## 2023-03-10 DIAGNOSIS — M5412 Radiculopathy, cervical region: Secondary | ICD-10-CM | POA: Diagnosis not present

## 2023-03-10 MED ORDER — KETOROLAC TROMETHAMINE 30 MG/ML IJ SOLN
30.0000 mg | Freq: Once | INTRAMUSCULAR | Status: AC
  Administered 2023-03-10: 30 mg via INTRAMUSCULAR
  Filled 2023-03-10: qty 1

## 2023-03-10 MED ORDER — METHOCARBAMOL 1000 MG/10ML IJ SOLN
200.0000 mg | Freq: Once | INTRAMUSCULAR | Status: AC
  Administered 2023-03-10: 1000 mg via INTRAMUSCULAR
  Filled 2023-03-10: qty 10

## 2023-03-10 NOTE — Patient Instructions (Addendum)
We need documented care management/clearance to stop Coumadin prior to procedure. Ok to bridge with Lovenox  ______________________________________________________________________    Preparing for your procedure  Appointments: If you think you may not be able to keep your appointment, call 24-48 hours in advance to cancel. We need time to make it available to others.  During your procedure appointment there will be: No Prescription Refills. No disability issues to discussed. No medication changes or discussions.  Instructions: Food intake: Avoid eating anything solid for at least 8 hours prior to your procedure. Clear liquid intake: You may take clear liquids such as water up to 2 hours prior to your procedure. (No carbonated drinks. No soda.) Transportation: Unless otherwise stated by your physician, bring a driver. (Driver cannot be a Market researcher, Pharmacist, community, or any other form of public transportation.) Morning Medicines: Except for blood thinners, take all of your other morning medications with a sip of water. Make sure to take your heart and blood pressure medicines. If your blood pressure's lower number is above 100, the case will be rescheduled. Blood thinners: Make sure to stop your blood thinners as instructed.  If you take a blood thinner, but were not instructed to stop it, call our office [phone] and ask to talk to a nurse. Not stopping a blood thinner prior to certain procedures could lead to serious complications. Diabetics on insulin: Notify the staff so that you can be scheduled 1st case in the morning. If your diabetes requires high dose insulin, take only  of your normal insulin dose the morning of the procedure and notify the staff that you have done so. Preventing infections: Shower with an antibacterial soap the morning of your procedure.  Build-up your immune system: Take 1000 mg of Vitamin C with every meal (3 times a day) the day prior to your procedure. Antibiotics: Inform  the nursing staff if you are taking any antibiotics or if you have any conditions that may require antibiotics prior to procedures. (Example: recent joint implants)   Pregnancy: If you are pregnant make sure to notify the nursing staff. Not doing so may result in injury to the fetus, including death.  Sickness: If you have a cold, fever, or any active infections, call and cancel or reschedule your procedure. Receiving steroids while having an infection may result in complications. Arrival: You must be in the facility at least 30 minutes prior to your scheduled procedure. Tardiness: Your scheduled time is also the cutoff time. If you do not arrive at least 15 minutes prior to your procedure, you will be rescheduled.  Children: Do not bring any children with you. Make arrangements to keep them home. Dress appropriately: There is always a possibility that your clothing may get soiled. Avoid long dresses. Valuables: Do not bring any jewelry or valuables.  Reasons to call and reschedule or cancel your procedure: (Following these recommendations will minimize the risk of a serious complication.) Surgeries: Avoid having procedures within 2 weeks of any surgery. (Avoid for 2 weeks before or after any surgery). Flu Shots: Avoid having procedures within 2 weeks of a flu shots or . (Avoid for 2 weeks before or after immunizations). Barium: Avoid having a procedure within 7-10 days after having had a radiological study involving the use of radiological contrast. (Myelograms, Barium swallow or enema study). Heart attacks: Avoid any elective procedures or surgeries for the initial 6 months after a "Myocardial Infarction" (Heart Attack). Blood thinners: It is imperative that you stop these medications before procedures. Let  us know if you if you take any blood thinner.  Infection: Avoid procedures during or within two weeks of an infection (including chest colds or gastrointestinal problems). Symptoms associated  with infections include: Localized redness, fever, chills, night sweats or profuse sweating, burning sensation when voiding, cough, congestion, stuffiness, runny nose, sore throat, diarrhea, nausea, vomiting, cold or Flu symptoms, recent or current infections. It is specially important if the infection is over the area that we intend to treat. Heart and lung problems: Symptoms that may suggest an active cardiopulmonary problem include: cough, chest pain, breathing difficulties or shortness of breath, dizziness, ankle swelling, uncontrolled high or unusually low blood pressure, and/or palpitations. If you are experiencing any of these symptoms, cancel your procedure and contact your primary care physician for an evaluation.  Remember:  Regular Business hours are:  Monday to Thursday 8:00 AM to 4:00 PM  Provider's Schedule: Delano Metz, MD:  Procedure days: Tuesday and Thursday 7:30 AM to 4:00 PM  Edward Jolly, MD:  Procedure days: Monday and Wednesday 7:30 AM to 4:00 PM Last  Updated: 02/15/2023 ______________________________________________________________________

## 2023-03-10 NOTE — Progress Notes (Signed)
Patient states she does not take Coumadin, has not taken for several months.

## 2023-03-10 NOTE — Progress Notes (Signed)
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 Underweight   18.5-24.9 kg/m2 Ideal body weight   25-29.9 kg/m2 Overweight Increased incidence by 20%  30-34.9 kg/m2 Obese (Class I) Increased incidence by 68%  35-39.9 kg/m2 Severe obesity (Class II) Increased incidence by 136%  >40 kg/m2 Extreme obesity (Class III) Increased incidence by 254%   Patient's current BMI Ideal Body weight  Body mass index is 31 kg/m. Ideal body weight: 52.4 kg (115 lb 8.3 oz) Adjusted ideal body weight: 63.2 kg (139 lb 5 oz)   BMI Readings from Last 4 Encounters:  03/10/23 31.00 kg/m  01/16/23 30.46 kg/m  01/11/23 30.72 kg/m  11/23/22 30.54 kg/m   Wt Readings from Last 4 Encounters:  03/10/23 175 lb (79.4 kg)  01/16/23 171 lb 15.3 oz (78 kg)  01/11/23 173 lb 6.4 oz (78.7 kg)  11/23/22 172 lb 6.4 oz (78.2 kg)    Psych/Mental status:  Alert, oriented x 3 (person, place, & time)       Eyes: PERLA Respiratory: No evidence of acute respiratory distress  Cervical Spine Area Exam  Skin & Axial Inspection: No masses, redness, edema, swelling, or associated skin lesions Alignment: Symmetrical Functional ROM: Pain restricted ROM, to the left Stability: No instability detected Muscle Tone/Strength: Functionally intact. No obvious neuro-muscular anomalies detected. Sensory (Neurological): Dermatomal pain pattern, + spurling's to the left Palpation: Complains of area being tender to palpation             Upper Extremity (  UE) Exam    Side: Right upper extremity  Side: Left upper extremity  Skin & Extremity Inspection: Skin color, temperature, and hair growth are WNL. No peripheral edema or cyanosis. No masses, redness, swelling, asymmetry, or associated skin lesions. No contractures.  Skin & Extremity Inspection: Skin color, temperature, and hair growth are WNL. No peripheral edema or cyanosis. No masses, redness, swelling, asymmetry, or associated skin lesions. No contractures.  Functional ROM: Unrestricted ROM          Functional ROM: Pain restricted ROM for shoulder  Muscle Tone/Strength: Functionally intact. No obvious neuro-muscular anomalies detected.  Muscle Tone/Strength: Functionally intact. No obvious neuro-muscular anomalies detected.  Sensory (Neurological): Unimpaired          Sensory (Neurological): Unimpaired          Palpation: No palpable anomalies              Palpation: No palpable anomalies              Provocative Test(s):  Phalen's test: deferred Tinel's test: deferred Apley's scratch test (touch opposite shoulder):  Action 1 (Across chest): deferred Action 2 (Overhead): deferred Action 3 (LB reach): deferred   Provocative Test(s):  Phalen's test: deferred Tinel's test: deferred Apley's scratch test (touch opposite shoulder):  Action 1 (Across chest): deferred Action 2 (Overhead): deferred Action 3 (LB  reach): deferred     Assessment  Primary Diagnosis & Pertinent Problem List: The primary encounter diagnosis was Cervical radicular pain. Diagnoses of Cervical facet joint syndrome, Bilateral occipital neuralgia, and Chronic pain syndrome were also pertinent to this visit.  Visit Diagnosis (New problems to examiner): 1. Cervical radicular pain   2. Cervical facet joint syndrome   3. Bilateral occipital neuralgia   4. Chronic pain syndrome    Plan of Care (Initial workup plan)  I reviewed the patient's cervical spine MRI with her.  She has severe arthritic changes at the left C1-C2 articulation which is likely contributing to symptoms of occipital neuralgia.  We discussed a occipital nerve block for her occipital headaches.  She also has disc bulges at C3-C4, C5-C6 and C6-C7 that is resulting in severe left foraminal stenosis.  Patient is likely symptomatic from this.  She is being referred here to consider a cervical epidural steroid injection.  Risks and benefits of that were reviewed and patient would like to proceed.  I also offered her intramuscular Robaxin and Toradol today.  Instructed her to refrain from NSAIDs for the next 5 days after Toradol injection.    Procedure Orders         Cervical Epidural Injection         GREATER OCCIPITAL NERVE BLOCK    Pharmacotherapy (current): Medications ordered:  Meds ordered this encounter  Medications   ketorolac (TORADOL) 30 MG/ML injection 30 mg   methocarbamol (ROBAXIN) injection 200 mg   Medications administered during this visit: We administered ketorolac and methocarbamol.    Interventional management options: Ms. Auen was informed that there is no guarantee that she would be a candidate for interventional therapies. The decision will be based on the results of diagnostic studies, as well as Ms. Green's risk profile.  Procedure(s) under consideration:  Cervical epidural steroid injection Cervical facet medial branch  nerve block Occipital nerve block Medial branch peripheral nerve stimulation     Provider-requested follow-up: Return in about 18 days (around 03/28/2023) for C-ESI, GONB, in clinic NS (WILL NEED TO STOP COUMADIN).  Future Appointments  Date Time Provider Department  Center  05/31/2023 12:45 PM [facility] None  05/31/2023  1:15 PM Earna Coder, MD [facility] None    Duration of encounter: .  Total time on encounter, as per AMA guidelines included both the face-to-face and non-face-to-face time personally spent by the physician and/or other qualified health care professional(s) on the day of the encounter (includes time in activities that require the physician or other qualified health care professional and does not include time in activities normally performed by clinical staff). Physician's time may include the following activities when performed: Preparing to see the patient (e.g., pre-charting review of records, searching for previously ordered imaging, lab work, and nerve conduction tests) Review of prior analgesic pharmacotherapies. Reviewing PMP Interpreting ordered tests (e.g., lab work, imaging, nerve conduction tests) Performing post-procedure evaluations, including interpretation of diagnostic procedures Obtaining and/or reviewing separately obtained history Performing a medically appropriate examination and/or evaluation Counseling and educating the patient/family/caregiver Ordering medications, tests, or procedures Referring and communicating with other health care professionals (when not separately reported) Documenting clinical information in the electronic or other health record Independently interpreting results (not separately reported) and communicating results to the patient/ family/caregiver Care coordination (not separately reported)  Note by: Erin Jolly, MD (TTS technology used. I apologize for any typographical errors that were not  detected and corrected.) Date: 03/10/2023; Time: 10:53 AM

## 2023-04-04 ENCOUNTER — Ambulatory Visit
Payer: 59 | Attending: Student in an Organized Health Care Education/Training Program | Admitting: Student in an Organized Health Care Education/Training Program

## 2023-04-04 DIAGNOSIS — G894 Chronic pain syndrome: Secondary | ICD-10-CM

## 2023-04-04 NOTE — Progress Notes (Signed)
No show

## 2023-05-21 ENCOUNTER — Emergency Department: Payer: 59

## 2023-05-21 ENCOUNTER — Other Ambulatory Visit: Payer: Self-pay

## 2023-05-21 ENCOUNTER — Emergency Department
Admission: EM | Admit: 2023-05-21 | Discharge: 2023-05-21 | Disposition: A | Discharge status: Home / Self Care | Payer: 59 | Attending: Student in an Organized Health Care Education/Training Program | Admitting: Student in an Organized Health Care Education/Training Program

## 2023-05-21 DIAGNOSIS — R059 Cough, unspecified: Secondary | ICD-10-CM | POA: Diagnosis not present

## 2023-05-21 DIAGNOSIS — R509 Fever, unspecified: Secondary | ICD-10-CM | POA: Diagnosis present

## 2023-05-21 DIAGNOSIS — R0602 Shortness of breath: Secondary | ICD-10-CM | POA: Insufficient documentation

## 2023-05-21 DIAGNOSIS — I700 Atherosclerosis of aorta: Secondary | ICD-10-CM | POA: Diagnosis not present

## 2023-05-21 DIAGNOSIS — R5381 Other malaise: Secondary | ICD-10-CM | POA: Diagnosis not present

## 2023-05-21 DIAGNOSIS — J205 Acute bronchitis due to respiratory syncytial virus: Secondary | ICD-10-CM | POA: Diagnosis not present

## 2023-05-21 DIAGNOSIS — I2510 Atherosclerotic heart disease of native coronary artery without angina pectoris: Secondary | ICD-10-CM | POA: Diagnosis not present

## 2023-05-21 DIAGNOSIS — Z20822 Contact with and (suspected) exposure to covid-19: Secondary | ICD-10-CM | POA: Insufficient documentation

## 2023-05-21 LAB — CBC
HCT: 39.9 % (ref 36.0–46.0)
Hemoglobin: 12.8 g/dL (ref 12.0–15.0)
MCH: 30.9 pg (ref 26.0–34.0)
MCHC: 32.1 g/dL (ref 30.0–36.0)
MCV: 96.4 fL (ref 80.0–100.0)
Platelets: 164 10*3/uL (ref 150–400)
RBC: 4.14 MIL/uL (ref 3.87–5.11)
RDW: 13.4 % (ref 11.5–15.5)
WBC: 8.2 10*3/uL (ref 4.0–10.5)
nRBC: 0 % (ref 0.0–0.2)

## 2023-05-21 LAB — TROPONIN I (HIGH SENSITIVITY)
Troponin I (High Sensitivity): 7 ng/L (ref <18)
Troponin I (High Sensitivity): 6 ng/L (ref <18)

## 2023-05-21 LAB — BASIC METABOLIC PANEL
Anion gap: 10 (ref 5–15)
BUN: 23 mg/dL (ref 8–23)
CO2: 28 mmol/L (ref 22–32)
Calcium: 8.8 mg/dL — ABNORMAL LOW (ref 8.9–10.3)
Chloride: 98 mmol/L (ref 98–111)
Creatinine, Ser: 1.6 mg/dL — ABNORMAL HIGH (ref 0.44–1.00)
GFR, Estimated: 33 mL/min — ABNORMAL LOW (ref >60)
Glucose, Bld: 85 mg/dL (ref 70–99)
Potassium: 4 mmol/L (ref 3.5–5.1)
Sodium: 136 mmol/L (ref 135–145)

## 2023-05-21 LAB — LACTIC ACID, PLASMA: Lactic Acid, Venous: 1 mmol/L (ref 0.5–1.9)

## 2023-05-21 LAB — PROTIME-INR
INR: 1.1 (ref 0.8–1.2)
Prothrombin Time: 14.1 s (ref 11.4–15.2)

## 2023-05-21 LAB — RESP PANEL BY RT-PCR (RSV, FLU A&B, COVID)  RVPGX2
Influenza A by PCR: NEGATIVE
Influenza B by PCR: NEGATIVE
Resp Syncytial Virus by PCR: POSITIVE — AB
SARS Coronavirus 2 by RT PCR: NEGATIVE

## 2023-05-21 MED ORDER — ALUM & MAG HYDROXIDE-SIMETH 200-200-20 MG/5ML PO SUSP
30.0000 mL | Freq: Once | ORAL | Status: AC
  Administered 2023-05-21: 30 mL via ORAL
  Filled 2023-05-21: qty 30

## 2023-05-21 MED ORDER — DOXYCYCLINE HYCLATE 100 MG PO CAPS: 100.0000 mg | ORAL_CAPSULE | Freq: Two times a day (BID) | ORAL | 0 refills | Status: AC

## 2023-05-21 MED ORDER — IPRATROPIUM-ALBUTEROL 0.5-2.5 (3) MG/3ML IN SOLN
3.0000 mL | Freq: Once | RESPIRATORY_TRACT | Status: AC
  Administered 2023-05-21: 3 mL via RESPIRATORY_TRACT
  Filled 2023-05-21: qty 3

## 2023-05-21 MED ORDER — IOHEXOL 350 MG/ML SOLN
60.0000 mL | Freq: Once | INTRAVENOUS | Status: AC | PRN
  Administered 2023-05-21: 60 mL via INTRAVENOUS

## 2023-05-21 MED ORDER — AZITHROMYCIN 250 MG PO TABS: ORAL_TABLET | ORAL | 0 refills | Status: DC

## 2023-05-21 MED ORDER — SODIUM CHLORIDE 0.9 % IV BOLUS
500.0000 mL | Freq: Once | INTRAVENOUS | Status: AC
  Administered 2023-05-21: 500 mL via INTRAVENOUS

## 2023-05-21 MED ORDER — DOXYCYCLINE HYCLATE 100 MG PO TABS
100.0000 mg | ORAL_TABLET | Freq: Once | ORAL | Status: AC
  Administered 2023-05-21: 100 mg via ORAL
  Filled 2023-05-21: qty 1

## 2023-05-21 MED ORDER — ACETAMINOPHEN 500 MG PO TABS
1000.0000 mg | ORAL_TABLET | Freq: Once | ORAL | Status: AC
  Administered 2023-05-21: 1000 mg via ORAL
  Filled 2023-05-21: qty 2

## 2023-05-21 MED ORDER — AZITHROMYCIN 500 MG PO TABS
500.0000 mg | ORAL_TABLET | Freq: Once | ORAL | Status: DC
  Filled 2023-05-21: qty 1

## 2023-05-21 NOTE — ED Provider Notes (Signed)
CTA without pulmonary embolism. Did raise question of pneumonia. Patient however afebrile and no leukocytosis. Additionally she stated she did feel better after medication then she did prior to arrival. At this time do think symptoms explained by RSV, however given radiographic findings will give prescription for antibiotics out of abundance of caution.    Phineas Semen, MD 05/21/23 [phone]

## 2023-05-21 NOTE — Discharge Instructions (Addendum)
Please seek medical attention for any high fevers, chest pain, shortness of breath, change in behavior, persistent vomiting, bloody stool or any other new or concerning symptoms.  

## 2023-05-21 NOTE — ED Triage Notes (Signed)
Pt arrives via [facility] from home for URI symptoms including runny nose, cough, congestion, chills and worsening SOB. Pt has hx of CHF. Also endorses diffuse CP.

## 2023-05-21 NOTE — ED Notes (Signed)
Walking SPO2 was 94-98% on RA.

## 2023-05-21 NOTE — ED Triage Notes (Signed)
First Nurse Note:  Pt via [facility] from home. Pt c/o SOB, congestion for the past 2 weeks, pt has a hx of CHF. Pt A&Ox4 and NAD.  93% on RA 140/77 BP  94 HR  98.4 oral

## 2023-05-21 NOTE — ED Provider Notes (Signed)
[facility] Provider Note    Event Date/Time   First MD Initiated Contact with Patient 05/21/23 1218     (approximate)   History   Shortness of Breath and URI   HPI  Erin [name] is a 77 y.o. female who presents to the for evaluation of cough shortness of breath and malaise.  States she has been feeling unwell for the past few days.  Has had nonproductive cough with chills.  Not currently on antibiotics.  Does not wear home oxygen.     Physical Exam   Triage Vital Signs: ED Triage Vitals  Encounter Vitals Group     BP 05/21/23 1214 (!) 98/50     Systolic BP Percentile --      Diastolic BP Percentile --      Pulse Rate 05/21/23 1214 91     Resp 05/21/23 1214 18     Temp 05/21/23 1214 98.6 F (37 C)     Temp Source 05/21/23 1214 Oral     SpO2 05/21/23 1214 94 %     Weight 05/21/23 1213 148 lb (67.1 kg)     Height 05/21/23 1213 5\' 3"  (1.6 m)     Head Circumference --      Peak Flow --      Pain Score 05/21/23 1212 6     Pain Loc --      Pain Education --      Exclude from Growth Chart --     Most recent vital signs: Vitals:   05/21/23 1214 05/21/23 1400  BP: (!) 98/50 (!) 109/94  Pulse: 91 98  Resp: 18 14  Temp: 98.6 F (37 C)   SpO2: 94% 96%     Constitutional: Alert  Eyes: Conjunctivae are normal.  Head: Atraumatic. Nose: No congestion/rhinnorhea. Mouth/Throat: Mucous membranes are moist.   Neck: Painless ROM.  Cardiovascular:   Good peripheral circulation. Respiratory: Normal respiratory effort.  Speaking complete sentences.  Coarse breath sounds throughout. Gastrointestinal: Soft and nontender.  Musculoskeletal:  no deformity Neurologic:  MAE spontaneously. No gross focal neurologic deficits are appreciated.  Skin:  Skin is warm, dry and intact. No rash noted. Psychiatric: Mood and affect are normal. Speech and behavior are normal.    ED Results / Procedures / Treatments   Labs (all labs ordered are listed, but  only abnormal results are displayed) Labs Reviewed  RESP PANEL BY RT-PCR (RSV, FLU A&B, COVID)  RVPGX2 - Abnormal; Notable for the following components:      Result Value   Resp Syncytial Virus by PCR POSITIVE (*)    All other components within normal limits  BASIC METABOLIC PANEL - Abnormal; Notable for the following components:   Creatinine, Ser 1.60 (*)    Calcium 8.8 (*)    GFR, Estimated 33 (*)    All other components within normal limits  CBC  LACTIC ACID, PLASMA  PROTIME-INR  TROPONIN I (HIGH SENSITIVITY)  TROPONIN I (HIGH SENSITIVITY)     EKG  ED ECG REPORT I, Willy Eddy, the attending physician, personally viewed and interpreted this ECG.   Date: 05/21/2023  EKG Time: 12:23  Rate: 95  Rhythm: sinus  Axis: normal  Intervals: normal  ST&T Change: no stemi, no depressions    RADIOLOGY Please see ED Course for my review and interpretation.  I personally reviewed all radiographic images ordered to evaluate for the above acute complaints and reviewed radiology reports and findings.  These findings were personally discussed with  the patient.  Please see medical record for radiology report.    PROCEDURES:  Critical Care performed:   Procedures   MEDICATIONS ORDERED IN ED: Medications  ipratropium-albuterol (DUONEB) 0.5-2.5 (3) MG/3ML nebulizer solution 3 mL (3 mLs Nebulization Given 05/21/23 1325)  sodium chloride 0.9 % bolus 500 mL (0 mLs Intravenous Stopped 05/21/23 1452)  acetaminophen (TYLENOL) tablet 1,000 mg (1,000 mg Oral Given 05/21/23 1417)  alum & mag hydroxide-simeth (MAALOX/MYLANTA) 200-200-20 MG/5ML suspension 30 mL (30 mLs Oral Given 05/21/23 1416)  iohexol (OMNIPAQUE) 350 MG/ML injection 60 mL (60 mLs Intravenous Contrast Given 05/21/23 1531)     IMPRESSION / MDM / ASSESSMENT AND PLAN / ED COURSE  I reviewed the triage vital signs and the nursing notes.                              Differential diagnosis includes, but is not limited  to, Asthma, copd, CHF, pna, ptx, malignancy, Pe, anemia  Patient presenting to the ER for evaluation of symptoms as described above.  Based on symptoms, risk factors and considered above differential, this presenting complaint could reflect a potentially life-threatening illness therefore the patient will be placed on continuous pulse oximetry and telemetry for monitoring.  Laboratory evaluation will be sent to evaluate for the above complaints.  Chest x-ray on my review and interpretation without evidence of pneumothorax.  Patient is RSV positive will give nebulizer treatment.  Will give some IV hydration initial blood pressure is mildly low.  Will check lactate and evaluate for sepsis.    Clinical Course as of 05/21/23 1532  Sat May 21, 2023  1429 Patient without any hypoxia on ambulation.  Blood pressure improving with hydration. [PR]  1531 Patient be signed out oncoming physician pending follow-up CTA.  If negative anticipate discharge home. [PR]    Clinical Course User Index [PR] Willy Eddy, MD     FINAL CLINICAL IMPRESSION(S) / ED DIAGNOSES   Final diagnoses:  RSV bronchitis     Rx / DC Orders   ED Discharge Orders     None        Note:  This document was prepared using Dragon voice recognition software and may include unintentional dictation errors.    Willy Eddy, MD 05/21/23 [phone]

## 2023-05-31 ENCOUNTER — Inpatient Hospital Stay: Payer: 59 | Attending: Internal Medicine

## 2023-05-31 ENCOUNTER — Encounter: Payer: Self-pay | Admitting: Internal Medicine

## 2023-05-31 ENCOUNTER — Inpatient Hospital Stay: Payer: 59 | Admitting: Nurse Practitioner
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
## 2023-09-12 ENCOUNTER — Ambulatory Visit: Payer: 59 | Admitting: Neurosurgery

## 2023-09-26 ENCOUNTER — Encounter: Payer: Self-pay | Admitting: Neurosurgery

## 2023-10-26 ENCOUNTER — Ambulatory Visit: Admitting: Neurosurgery

## 2024-01-10 ENCOUNTER — Emergency Department

## 2024-01-10 ENCOUNTER — Emergency Department
Admission: EM | Admit: 2024-01-10 | Discharge: 2024-01-10 | Disposition: A | Discharge status: Home / Self Care | Attending: Emergency Medicine | Admitting: Emergency Medicine

## 2024-01-10 ENCOUNTER — Other Ambulatory Visit: Payer: Self-pay

## 2024-01-10 DIAGNOSIS — R531 Weakness: Secondary | ICD-10-CM | POA: Insufficient documentation

## 2024-01-10 DIAGNOSIS — N189 Chronic kidney disease, unspecified: Secondary | ICD-10-CM | POA: Diagnosis not present

## 2024-01-10 DIAGNOSIS — J45909 Unspecified asthma, uncomplicated: Secondary | ICD-10-CM | POA: Insufficient documentation

## 2024-01-10 LAB — CBC WITH DIFFERENTIAL/PLATELET
Abs Immature Granulocytes: 0.03 K/uL (ref 0.00–0.07)
Basophils Absolute: 0 K/uL (ref 0.0–0.1)
Basophils Relative: 1 %
Eosinophils Absolute: 0 K/uL (ref 0.0–0.5)
Eosinophils Relative: 1 %
HCT: 42 % (ref 36.0–46.0)
Hemoglobin: 13.9 g/dL (ref 12.0–15.0)
Immature Granulocytes: 1 %
Lymphocytes Relative: 19 %
Lymphs Abs: 1.2 K/uL (ref 0.7–4.0)
MCH: 31.3 pg (ref 26.0–34.0)
MCHC: 33.1 g/dL (ref 30.0–36.0)
MCV: 94.6 fL (ref 80.0–100.0)
Monocytes Absolute: 0.4 K/uL (ref 0.1–1.0)
Monocytes Relative: 6 %
Neutro Abs: 4.7 K/uL (ref 1.7–7.7)
Neutrophils Relative %: 72 %
Platelets: 169 K/uL (ref 150–400)
RBC: 4.44 MIL/uL (ref 3.87–5.11)
RDW: 13.2 % (ref 11.5–15.5)
WBC: 6.4 K/uL (ref 4.0–10.5)
nRBC: 0 % (ref 0.0–0.2)

## 2024-01-10 LAB — URINALYSIS, W/ REFLEX TO CULTURE (INFECTION SUSPECTED)
Bilirubin Urine: NEGATIVE
Glucose, UA: NEGATIVE mg/dL
Ketones, ur: NEGATIVE mg/dL
Leukocytes,Ua: NEGATIVE
Nitrite: NEGATIVE
Protein, ur: NEGATIVE mg/dL
Specific Gravity, Urine: 1.009 (ref 1.005–1.030)
pH: 6 (ref 5.0–8.0)

## 2024-01-10 LAB — COMPREHENSIVE METABOLIC PANEL WITH GFR
ALT: 12 U/L (ref 0–44)
AST: 20 U/L (ref 15–41)
Albumin: 3.8 g/dL (ref 3.5–5.0)
Alkaline Phosphatase: 86 U/L (ref 38–126)
Anion gap: 9 (ref 5–15)
BUN: 19 mg/dL (ref 8–23)
CO2: 28 mmol/L (ref 22–32)
Calcium: 9.2 mg/dL (ref 8.9–10.3)
Chloride: 101 mmol/L (ref 98–111)
Creatinine, Ser: 1.38 mg/dL — ABNORMAL HIGH (ref 0.44–1.00)
GFR, Estimated: 40 mL/min — ABNORMAL LOW (ref >60)
Glucose, Bld: 96 mg/dL (ref 70–99)
Potassium: 3.7 mmol/L (ref 3.5–5.1)
Sodium: 138 mmol/L (ref 135–145)
Total Bilirubin: 0.4 mg/dL (ref 0.0–1.2)
Total Protein: 7.3 g/dL (ref 6.5–8.1)

## 2024-01-10 LAB — PROTIME-INR
INR: 0.9 (ref 0.8–1.2)
Prothrombin Time: 12.9 s (ref 11.4–15.2)

## 2024-01-10 LAB — TROPONIN I (HIGH SENSITIVITY): Troponin I (High Sensitivity): 4 ng/L (ref <18)

## 2024-01-10 NOTE — Discharge Instructions (Addendum)
 You were seen in the emergency department for weakness.  This is likely due to you not having before meals.  Would recommend you call some family members and see if you are able to stay with them until your [facility] is fixed.  Stay hydrated and drink plenty of fluids.  Your EKG and lab work were all reassuring today.  Your chest x-ray did not show any signs of pneumonia.  Follow-up closely with your primary care physician and return to the emergency department if you have any worsening symptoms.
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
## 2024-01-10 NOTE — ED Provider Notes (Signed)
 [facility] Provider Note    Event Date/Time   First MD Initiated Contact with Patient 01/10/24 1042     (approximate)   History   Weakness   HPI  Erin [name] is a 78 y.o. female past medical history significant for asthma, CKD, presents to the emergency department for not feeling well.  States that she was feeling weak and short of breath since her AC went out last night.  States that it was very hot in her room so she called EMS.  States that she was unable to get it working for a day.  Does state that she has a history of asthma.  Denies any significant cough.  No nausea or vomiting.  Denies any chest pain.  No falls or trauma.  No headache.  States that she could call her cousin and see if she could stay with them.  Previously on Coumadin  but states that she is no longer taking that medication.     Physical Exam   Triage Vital Signs: ED Triage Vitals [01/10/24 1039]  Encounter Vitals Group     BP 128/82     Girls Systolic BP Percentile      Girls Diastolic BP Percentile      Boys Systolic BP Percentile      Boys Diastolic BP Percentile      Pulse Rate 83     Resp 15     Temp 98.3 F (36.8 C)     Temp Source Oral     SpO2 98 %     Weight      Height      Head Circumference      Peak Flow      Pain Score      Pain Loc      Pain Education      Exclude from Growth Chart     Most recent vital signs: Vitals:   01/10/24 1039  BP: 128/82  Pulse: 83  Resp: 15  Temp: 98.3 F (36.8 C)  SpO2: 98%    Physical Exam Constitutional:      Appearance: She is well-developed.  HENT:     Head: Atraumatic.  Eyes:     Conjunctiva/sclera: Conjunctivae normal.  Cardiovascular:     Rate and Rhythm: Regular rhythm.  Pulmonary:     Effort: Pulmonary effort is normal. No respiratory distress.     Breath sounds: Normal breath sounds.  Abdominal:     General: There is no distension.  Musculoskeletal:        General: Normal range of motion.      Cervical back: Normal range of motion.  Skin:    General: Skin is warm.     Capillary Refill: Capillary refill takes less than 2 seconds.  Neurological:     Mental Status: She is alert. Mental status is at baseline.     IMPRESSION / MDM / ASSESSMENT AND PLAN / ED COURSE  I reviewed the triage vital signs and the nursing notes.  Differential diagnosis including dehydration, electrolyte abnormality, pneumonia, ACS, heat exposure  EKG  I, Clotilda Punter, the attending physician, personally viewed and interpreted this ECG.   Rate: Normal  Rhythm: Normal sinus  Axis: Normal  Intervals: Normal  ST&T Change: None  No tachycardic or bradycardic dysrhythmias while on cardiac telemetry.  RADIOLOGY I independently reviewed imaging, my interpretation of imaging: Chest x-ray with no focal findings consistent with pneumonia  LABS (all labs ordered are listed, but only  abnormal results are displayed) Labs interpreted as -    Labs Reviewed  COMPREHENSIVE METABOLIC PANEL WITH GFR - Abnormal; Notable for the following components:      Result Value   Creatinine, Ser 1.38 (*)    GFR, Estimated 40 (*)    All other components within normal limits  URINALYSIS, W/ REFLEX TO CULTURE (INFECTION SUSPECTED) - Abnormal; Notable for the following components:   Color, Urine STRAW (*)    APPearance CLEAR (*)    Hgb urine dipstick SMALL (*)    Bacteria, UA RARE (*)    All other components within normal limits  CBC WITH DIFFERENTIAL/PLATELET  PROTIME-INR  TROPONIN I (HIGH SENSITIVITY)     MDM  No leukocytosis.  Creatinine appears to be improved from her baseline and it is 1.38 today with a normal BUN.  No significant electrolyte abnormality.  Troponin is negative.  Normal INR.  UA with no signs of a urinary tract infection.  Chest x-ray with no signs of pneumonia.  No chest pain or shortness of breath at this time.  Low suspicion for asthma exacerbation.  Patient tearful in the room, concern  for underlying anxiety.  Discussed close follow-up as an outpatient with primary care provider.      PROCEDURES:  Critical Care performed: No  Procedures  Patient's presentation is most consistent with acute presentation with potential threat to life or bodily function.   MEDICATIONS ORDERED IN ED: Medications - No data to display  FINAL CLINICAL IMPRESSION(S) / ED DIAGNOSES   Final diagnoses:  Weakness     Rx / DC Orders   ED Discharge Orders     None        Note:  This document was prepared using Dragon voice recognition software and may include unintentional dictation errors.   Suzanne Kirsch, MD 01/10/24 1209

## 2024-01-14 ENCOUNTER — Other Ambulatory Visit: Payer: Self-pay

## 2024-01-14 ENCOUNTER — Inpatient Hospital Stay
Admission: EM | Admit: 2024-01-14 | Discharge: 2024-01-21 | DRG: 392 | Disposition: A | Discharge status: Skilled Nursing Facility | Attending: Internal Medicine | Admitting: Internal Medicine

## 2024-01-14 ENCOUNTER — Emergency Department

## 2024-01-14 DIAGNOSIS — N183 Chronic kidney disease, stage 3 unspecified: Secondary | ICD-10-CM | POA: Diagnosis present

## 2024-01-14 DIAGNOSIS — R112 Nausea with vomiting, unspecified: Secondary | ICD-10-CM

## 2024-01-14 DIAGNOSIS — F419 Anxiety disorder, unspecified: Secondary | ICD-10-CM | POA: Diagnosis present

## 2024-01-14 DIAGNOSIS — E876 Hypokalemia: Secondary | ICD-10-CM | POA: Diagnosis present

## 2024-01-14 DIAGNOSIS — E663 Overweight: Secondary | ICD-10-CM | POA: Diagnosis present

## 2024-01-14 DIAGNOSIS — N1832 Chronic kidney disease, stage 3b: Secondary | ICD-10-CM | POA: Diagnosis present

## 2024-01-14 DIAGNOSIS — M940 Chondrocostal junction syndrome [Tietze]: Secondary | ICD-10-CM | POA: Diagnosis not present

## 2024-01-14 DIAGNOSIS — R7881 Bacteremia: Secondary | ICD-10-CM

## 2024-01-14 DIAGNOSIS — Z809 Family history of malignant neoplasm, unspecified: Secondary | ICD-10-CM

## 2024-01-14 DIAGNOSIS — Z882 Allergy status to sulfonamides status: Secondary | ICD-10-CM

## 2024-01-14 DIAGNOSIS — D696 Thrombocytopenia, unspecified: Secondary | ICD-10-CM | POA: Diagnosis not present

## 2024-01-14 DIAGNOSIS — Z8616 Personal history of COVID-19: Secondary | ICD-10-CM

## 2024-01-14 DIAGNOSIS — K529 Noninfective gastroenteritis and colitis, unspecified: Secondary | ICD-10-CM | POA: Diagnosis not present

## 2024-01-14 DIAGNOSIS — Z9181 History of falling: Secondary | ICD-10-CM

## 2024-01-14 DIAGNOSIS — I129 Hypertensive chronic kidney disease with stage 1 through stage 4 chronic kidney disease, or unspecified chronic kidney disease: Secondary | ICD-10-CM | POA: Diagnosis present

## 2024-01-14 DIAGNOSIS — Z825 Family history of asthma and other chronic lower respiratory diseases: Secondary | ICD-10-CM

## 2024-01-14 DIAGNOSIS — R1084 Generalized abdominal pain: Secondary | ICD-10-CM | POA: Diagnosis not present

## 2024-01-14 DIAGNOSIS — Z79899 Other long term (current) drug therapy: Secondary | ICD-10-CM

## 2024-01-14 DIAGNOSIS — J45909 Unspecified asthma, uncomplicated: Secondary | ICD-10-CM | POA: Diagnosis present

## 2024-01-14 DIAGNOSIS — M5481 Occipital neuralgia: Secondary | ICD-10-CM | POA: Diagnosis present

## 2024-01-14 DIAGNOSIS — F32A Depression, unspecified: Secondary | ICD-10-CM

## 2024-01-14 DIAGNOSIS — E875 Hyperkalemia: Secondary | ICD-10-CM | POA: Diagnosis present

## 2024-01-14 DIAGNOSIS — Z7951 Long term (current) use of inhaled steroids: Secondary | ICD-10-CM

## 2024-01-14 DIAGNOSIS — R079 Chest pain, unspecified: Secondary | ICD-10-CM

## 2024-01-14 DIAGNOSIS — S8265XA Nondisplaced fracture of lateral malleolus of left fibula, initial encounter for closed fracture: Principal | ICD-10-CM | POA: Diagnosis present

## 2024-01-14 DIAGNOSIS — C851A Unspecified b-cell lymphoma, in remission: Secondary | ICD-10-CM | POA: Diagnosis present

## 2024-01-14 DIAGNOSIS — Z880 Allergy status to penicillin: Secondary | ICD-10-CM

## 2024-01-14 DIAGNOSIS — W010XXA Fall on same level from slipping, tripping and stumbling without subsequent striking against object, initial encounter: Secondary | ICD-10-CM | POA: Diagnosis present

## 2024-01-14 DIAGNOSIS — K5900 Constipation, unspecified: Secondary | ICD-10-CM | POA: Diagnosis present

## 2024-01-14 DIAGNOSIS — Z888 Allergy status to other drugs, medicaments and biological substances status: Secondary | ICD-10-CM

## 2024-01-14 DIAGNOSIS — Z7982 Long term (current) use of aspirin: Secondary | ICD-10-CM

## 2024-01-14 DIAGNOSIS — Z7983 Long term (current) use of bisphosphonates: Secondary | ICD-10-CM

## 2024-01-14 DIAGNOSIS — Z9049 Acquired absence of other specified parts of digestive tract: Secondary | ICD-10-CM

## 2024-01-14 DIAGNOSIS — K219 Gastro-esophageal reflux disease without esophagitis: Secondary | ICD-10-CM | POA: Diagnosis present

## 2024-01-14 DIAGNOSIS — Z9221 Personal history of antineoplastic chemotherapy: Secondary | ICD-10-CM

## 2024-01-14 LAB — URINALYSIS, W/ REFLEX TO CULTURE (INFECTION SUSPECTED)
Bacteria, UA: NONE SEEN
Bilirubin Urine: NEGATIVE
Glucose, UA: NEGATIVE mg/dL
Hgb urine dipstick: NEGATIVE
Ketones, ur: NEGATIVE mg/dL
Leukocytes,Ua: NEGATIVE
Nitrite: NEGATIVE
Protein, ur: NEGATIVE mg/dL
Specific Gravity, Urine: 1.005 (ref 1.005–1.030)
Squamous Epithelial / HPF: 0 /HPF (ref 0–5)
pH: 7 (ref 5.0–8.0)

## 2024-01-14 LAB — PROTIME-INR
INR: 1.4 — ABNORMAL HIGH (ref 0.8–1.2)
Prothrombin Time: 17.7 s — ABNORMAL HIGH (ref 11.4–15.2)

## 2024-01-14 LAB — LACTIC ACID, PLASMA: Lactic Acid, Venous: 1.1 mmol/L (ref 0.5–1.9)

## 2024-01-14 NOTE — ED Triage Notes (Signed)
 Pt is coming from home with medic for abd pain generalized in the lower abd, she has been constipated the last few weeks. She did vomit as well. The pain is 8/10, She also fell when she was going to the bathroom and has some left ankle pain as well. Medic reports SHE HAS BED BUGS. Allegedly she was hypotensive with fire, and was also hypotensive with medic.   Medic vitals   78/52 103bgl 93%ra 78hr 97.6

## 2024-01-14 NOTE — ED Provider Notes (Signed)
 [facility] Provider Note    Event Date/Time   First MD Initiated Contact with Patient 01/14/24 2254     (approximate)   History   Abdominal Pain   HPI Erin [name] is a 78 y.o. female who presents by EMS for a variety of complaints.  Reportedly she has been constipated and unable to have a bowel movement for at least several days, possibly longer.  She has been having intermittent abdominal pain.  The abdominal pain has been severe today and tonight and she tried to get to the bathroom to have a bowel movement but then somehow in the process tripped and twisted and injured her left ankle.  She states she never did get to go to the bathroom.  She wants to do so but has been unable.  She has had some vomiting and according to the paramedics who picked her up her emesis appeared feculent.  She denies any chest pain or shortness of breath.  Her abdomen still hurts.  She denies having had an obstruction in the past.  No recent abdominal surgery.  She denies recent fever and dysuria.  She said she has taken medicine for her anxiety and still appears quite anxious and worried.  She has not hit her head and has no headache nor neck pain.  According to paramedics, the patient had bedbugs at her residence.  She was also reportedly hypotensive in the field with a blood pressure of 78/52 although her blood pressure has normalized when triage vitals were obtained in the ED.     Physical Exam   Triage Vital Signs: ED Triage Vitals  Encounter Vitals Group     BP 01/14/24 2301 135/82     Girls Systolic BP Percentile --      Girls Diastolic BP Percentile --      Boys Systolic BP Percentile --      Boys Diastolic BP Percentile --      Pulse Rate 01/14/24 2301 83     Resp 01/14/24 2301 17     Temp 01/14/24 2301 99 F (37.2 C)     Temp Source 01/14/24 2301 Rectal     SpO2 01/14/24 2301 98 %     Weight --      Height --      Head Circumference --      Peak Flow --       Pain Score 01/14/24 2252 8     Pain Loc --      Pain Education --      Exclude from Growth Chart --     Most recent vital signs: Vitals:   01/15/24 0230 01/15/24 0245  BP: (!) 112/55 128/67  Pulse: 78 80  Resp: 18 10  Temp:    SpO2: 98% 100%    General: Awake, alert.  The patient is upset and appears uncomfortable but is also obviously quite anxious. CV:  Good peripheral perfusion.  Regular rate and rhythm, normal heart sounds. Resp:  Normal effort. Speaking easily and comfortably, no accessory muscle usage nor intercostal retractions.  Lungs are clear to auscultation bilaterally. Abd:  Abdomen appears somewhat distended and is tense though not peritoneal.  She is reporting mild to moderate generalized tenderness to palpation throughout without any localizable tenderness. MSK:  Mild swelling and slight ecchymosis with tenderness to palpation and manipulation of the left lateral malleolus consistent with probable sprain   ED Results / Procedures / Treatments   Labs (all labs ordered  are listed, but only abnormal results are displayed) Labs Reviewed  CBC - Abnormal; Notable for the following components:      Result Value   RBC 2.36 (*)    Hemoglobin 7.4 (*)    HCT 23.0 (*)    Platelets 89 (*)    All other components within normal limits  PROTIME-INR - Abnormal; Notable for the following components:   Prothrombin Time 17.7 (*)    INR 1.4 (*)    All other components within normal limits  URINALYSIS, W/ REFLEX TO CULTURE (INFECTION SUSPECTED) - Abnormal; Notable for the following components:   Color, Urine STRAW (*)    APPearance CLEAR (*)    All other components within normal limits  COMPREHENSIVE METABOLIC PANEL WITH GFR - Abnormal; Notable for the following components:   Potassium 3.3 (*)    Glucose, Bld 116 (*)    Creatinine, Ser 1.44 (*)    GFR, Estimated 38 (*)    All other components within normal limits  CULTURE, BLOOD (SINGLE)  LACTIC ACID, PLASMA  LIPASE,  BLOOD  CBC WITH DIFFERENTIAL/PLATELET  TROPONIN I (HIGH SENSITIVITY)  TROPONIN I (HIGH SENSITIVITY)     EKG  ED ECG REPORT #1 I, Darleene Dome, the attending physician, personally viewed and interpreted this ECG.  Date: 01/14/2024 EKG Time: 23: 14 Rate: 83 Rhythm: normal sinus rhythm QRS Axis: normal Intervals: normal ST/T Wave abnormalities: normal Narrative Interpretation: no evidence of acute ischemia  ED ECG REPORT I, Darleene Dome, the attending physician, personally viewed and interpreted this ECG.  Date: 01/15/2024 EKG Time: 1:09 AM Rate: 61 Rhythm: normal sinus rhythm QRS Axis: normal Intervals: normal ST/T Wave abnormalities: normal Narrative Interpretation: no evidence of acute ischemia  ED ECG REPORT #3 I, Darleene Dome, the attending physician, personally viewed and interpreted this ECG.  Date: 01/15/2024 EKG Time: 1:13 AM Rate: 61 Rhythm: normal sinus rhythm QRS Axis: normal Intervals: normal ST/T Wave abnormalities: normal Narrative Interpretation: no evidence of acute ischemia    RADIOLOGY See ED course for details   PROCEDURES:  Critical Care performed: No  .1-3 Lead EKG Interpretation  Performed by: Dome Darleene, MD Authorized by: Dome Darleene, MD     Interpretation: normal     ECG rate:  80   ECG rate assessment: normal     Rhythm: sinus rhythm     Ectopy: none     Conduction: normal       IMPRESSION / MDM / ASSESSMENT AND PLAN / ED COURSE  I reviewed the triage vital signs and the nursing notes.                              Differential diagnosis includes, but is not limited to, bowel obstruction/ileus, volvulus, colitis/enteritis, constipation/obstipation, urinary tract infection, renal dysfunction.  Patient's presentation is most consistent with acute presentation with potential threat to life or bodily function.  Labs/studies ordered: I ordered standard sepsis labs/studies including the following: respiratory viral  panel PCR swab, blood cultures x1 (for now), pro time-INR, CMP, urinalysis, urine culture, lactic acid, CBC with differential, high-sensitivity troponin, lipase, 1-view chest x-ray, EKG.  Interventions/Medications given:  Medications  metoCLOPramide  (REGLAN ) injection 10 mg (has no administration in time range)  lactated ringers  bolus 1,000 mL (has no administration in time range)  morphine  (PF) 4 MG/ML injection 4 mg (4 mg Intravenous Given 01/15/24 0032)  ondansetron  (ZOFRAN ) injection 4 mg (4 mg Intravenous Given 01/15/24 0032)  iohexol  (  OMNIPAQUE ) 300 MG/ML solution 80 mL (80 mLs Intravenous Contrast Given 01/15/24 0122)  midazolam  (VERSED ) injection 1 mg (1 mg Intravenous Given 01/15/24 0154)  sodium chloride  0.9 % bolus 500 mL (500 mLs Intravenous New Bag/Given 01/15/24 0222)    (Note:  hospital course my include additional interventions and/or labs/studies not listed above.)   Patient's vital signs have normalized although reportedly she was hypotensive in the field.  Possible sepsis workup is pending at this time.  Rectal temperature is 99 degrees, patient is currently normotensive and not tachycardic.  Anticipate CT scan of the abdomen and pelvis after verifying patient's renal function.  I will order a small dose of morphine  and Zofran  for her pain.  She also has some bruising and swelling to the left lateral malleolus, x-rays pending of the ankle as well.  Patient was here at this emergency department about 4 days ago for weakness after her air conditioning stopped working and she had a reassuring workup at that time.  Her labs and medical screening exam were reassuring with a baseline creatinine of around 1.3-1.4 and she was discharged home.  Clinical status seems to have declined in the last 4 days.  The patient is on the cardiac monitor to evaluate for evidence of arrhythmia and/or significant heart rate changes.   Clinical Course as of 01/15/24 0400  Sat Jan 14, 2024  2337  Hemoglobin(!): 7.4 Hemoglobin of 7.4, a drop of 6 point in the last 4 days since the labs were last obtained. [CF]  2354 Lactic Acid, Venous: 1.1 [CF]  Sun Jan 15, 2024  0011 DG Ankle Complete Left I independently viewed and interpreted the patient's ankle x-rays and it appears that she has a small fracture of the left lateral malleolus.  Radiology confirmed nondisplaced left lateral malleolus fracture.  She has no other pain or tenderness in her lower extremity and I think that the probability of a medial or syndesmotic injury is extremely low.  Given that this should be a stable (nondisplaced) fracture, I have ordered an Aircast for her [CF]  0037 DG Chest Port 1 View I independently viewed and interpreted the patient's chest x-ray(s), and I also reviewed the radiologist's report.  I see no evidence of pneumonia nor pulmonary edema or other acute abnormality.  I also read the radiologist's report, which confirmed no acute findings. [CF]  0058 Comprehensive metabolic panel with GFR(!) Stable CMP including creatinine.  Proceeding with CT scan of the abdomen and pelvis. [CF]  0118 Patient is now reporting left-sided chest pain.  I suspect this is related to the abdominal pain, but 2 different EKGs were obtained by staff.  I added on a troponin level. [CF]  0125 Patient remains very anxious and a little bit nauseated.  Her QTc is a little bit prolonged and I will hold off on additional potential QTc prolongation agents.  I ordered Versed  1 mg IV which I think will help with all of her symptoms. [CF]  0145 Troponin I (High Sensitivity): 6 [CF]  0158 Given the drop in hemoglobin, I have asked a straight stick to repeat a hemoglobin and hematocrit level to see if the first 1 was erroneous. [CF]  0159 CT ABDOMEN PELVIS W CONTRAST I independently viewed and interpreted the patient's abd/pelvis CT, as well as reviewing the radiologist's report.  There is some inflammatory changes in the distal colon and  radiologist feels that this may reflect mild colitis. [CF]  0315 CBC with Differential The repeat CBC makes much  more sense, with no leukocytosis, no thrombocytopenia, and a stable and normal hemoglobin.  The first result was clearly erroneous. [CF]  0335 Urinalysis, w/ Reflex to Culture (Infection Suspected) -Urine, Catheterized(!) Normal urinalysis [CF]  0335 I reassessed the patient and she is still having chest pain and abdominal pain as well as nausea and is unable to tolerate any oral intake.  She is not hypotensive but her blood pressure is on the low side.    Given her comorbidities and her ongoing pain and intractable nausea, I fear she will need to stay in the hospital.  I talked with her about this and she agrees this would be best.  I am consulting the hospitalist team for admission.  I also ordered a repeat troponin and another 10 mg of Reglan . [CF]  0359 I consulted by phone with the admitting hospitalist, and they will admit the patient - Dr. Lawence [CF]    Clinical Course User Index [CF] Gordan Huxley, MD     FINAL CLINICAL IMPRESSION(S) / ED DIAGNOSES   Final diagnoses:  Closed nondisplaced fracture of lateral malleolus of left fibula, initial encounter  Generalized abdominal pain  Nausea and vomiting, unspecified vomiting type  Constipation, unspecified constipation type     Rx / DC Orders   ED Discharge Orders     None        Note:  This document was prepared using Dragon voice recognition software and may include unintentional dictation errors.   Gordan Huxley, MD 01/15/24 0400

## 2024-01-15 ENCOUNTER — Inpatient Hospital Stay

## 2024-01-15 ENCOUNTER — Emergency Department

## 2024-01-15 ENCOUNTER — Encounter: Payer: Self-pay | Admitting: Family Medicine

## 2024-01-15 DIAGNOSIS — B9689 Other specified bacterial agents as the cause of diseases classified elsewhere: Secondary | ICD-10-CM | POA: Diagnosis not present

## 2024-01-15 DIAGNOSIS — R1084 Generalized abdominal pain: Secondary | ICD-10-CM | POA: Diagnosis present

## 2024-01-15 DIAGNOSIS — Z825 Family history of asthma and other chronic lower respiratory diseases: Secondary | ICD-10-CM | POA: Diagnosis not present

## 2024-01-15 DIAGNOSIS — K529 Noninfective gastroenteritis and colitis, unspecified: Secondary | ICD-10-CM | POA: Diagnosis present

## 2024-01-15 DIAGNOSIS — J45909 Unspecified asthma, uncomplicated: Secondary | ICD-10-CM | POA: Diagnosis present

## 2024-01-15 DIAGNOSIS — C851A Unspecified b-cell lymphoma, in remission: Secondary | ICD-10-CM | POA: Diagnosis present

## 2024-01-15 DIAGNOSIS — Z888 Allergy status to other drugs, medicaments and biological substances status: Secondary | ICD-10-CM | POA: Diagnosis not present

## 2024-01-15 DIAGNOSIS — E876 Hypokalemia: Secondary | ICD-10-CM | POA: Diagnosis present

## 2024-01-15 DIAGNOSIS — D696 Thrombocytopenia, unspecified: Secondary | ICD-10-CM | POA: Diagnosis not present

## 2024-01-15 DIAGNOSIS — E663 Overweight: Secondary | ICD-10-CM | POA: Diagnosis present

## 2024-01-15 DIAGNOSIS — E875 Hyperkalemia: Secondary | ICD-10-CM | POA: Diagnosis present

## 2024-01-15 DIAGNOSIS — Z882 Allergy status to sulfonamides status: Secondary | ICD-10-CM | POA: Diagnosis not present

## 2024-01-15 DIAGNOSIS — F32A Depression, unspecified: Secondary | ICD-10-CM

## 2024-01-15 DIAGNOSIS — S8265XA Nondisplaced fracture of lateral malleolus of left fibula, initial encounter for closed fracture: Secondary | ICD-10-CM | POA: Diagnosis present

## 2024-01-15 DIAGNOSIS — Z8616 Personal history of COVID-19: Secondary | ICD-10-CM | POA: Diagnosis not present

## 2024-01-15 DIAGNOSIS — A049 Bacterial intestinal infection, unspecified: Secondary | ICD-10-CM | POA: Diagnosis not present

## 2024-01-15 DIAGNOSIS — K219 Gastro-esophageal reflux disease without esophagitis: Secondary | ICD-10-CM | POA: Diagnosis present

## 2024-01-15 DIAGNOSIS — Z9049 Acquired absence of other specified parts of digestive tract: Secondary | ICD-10-CM | POA: Diagnosis not present

## 2024-01-15 DIAGNOSIS — N1832 Chronic kidney disease, stage 3b: Secondary | ICD-10-CM | POA: Diagnosis present

## 2024-01-15 DIAGNOSIS — W010XXA Fall on same level from slipping, tripping and stumbling without subsequent striking against object, initial encounter: Secondary | ICD-10-CM | POA: Diagnosis present

## 2024-01-15 DIAGNOSIS — R0789 Other chest pain: Secondary | ICD-10-CM | POA: Diagnosis not present

## 2024-01-15 DIAGNOSIS — R7881 Bacteremia: Secondary | ICD-10-CM | POA: Diagnosis not present

## 2024-01-15 DIAGNOSIS — R079 Chest pain, unspecified: Secondary | ICD-10-CM

## 2024-01-15 DIAGNOSIS — Z809 Family history of malignant neoplasm, unspecified: Secondary | ICD-10-CM | POA: Diagnosis not present

## 2024-01-15 DIAGNOSIS — Z7983 Long term (current) use of bisphosphonates: Secondary | ICD-10-CM | POA: Diagnosis not present

## 2024-01-15 DIAGNOSIS — Z9181 History of falling: Secondary | ICD-10-CM | POA: Diagnosis not present

## 2024-01-15 DIAGNOSIS — F419 Anxiety disorder, unspecified: Secondary | ICD-10-CM

## 2024-01-15 DIAGNOSIS — Z880 Allergy status to penicillin: Secondary | ICD-10-CM | POA: Diagnosis not present

## 2024-01-15 DIAGNOSIS — K5900 Constipation, unspecified: Secondary | ICD-10-CM | POA: Diagnosis present

## 2024-01-15 DIAGNOSIS — Z7982 Long term (current) use of aspirin: Secondary | ICD-10-CM | POA: Diagnosis not present

## 2024-01-15 DIAGNOSIS — I129 Hypertensive chronic kidney disease with stage 1 through stage 4 chronic kidney disease, or unspecified chronic kidney disease: Secondary | ICD-10-CM | POA: Diagnosis present

## 2024-01-15 DIAGNOSIS — Z7951 Long term (current) use of inhaled steroids: Secondary | ICD-10-CM | POA: Diagnosis not present

## 2024-01-15 LAB — BASIC METABOLIC PANEL WITH GFR
Anion gap: 6 (ref 5–15)
BUN: 18 mg/dL (ref 8–23)
CO2: 26 mmol/L (ref 22–32)
Calcium: 8.7 mg/dL — ABNORMAL LOW (ref 8.9–10.3)
Chloride: 107 mmol/L (ref 98–111)
Creatinine, Ser: 1.54 mg/dL — ABNORMAL HIGH (ref 0.44–1.00)
GFR, Estimated: 35 mL/min — ABNORMAL LOW (ref >60)
Glucose, Bld: 133 mg/dL — ABNORMAL HIGH (ref 70–99)
Potassium: 3.2 mmol/L — ABNORMAL LOW (ref 3.5–5.1)
Sodium: 139 mmol/L (ref 135–145)

## 2024-01-15 LAB — CBC WITH DIFFERENTIAL/PLATELET
Abs Immature Granulocytes: 0.03 K/uL (ref 0.00–0.07)
Basophils Absolute: 0 K/uL (ref 0.0–0.1)
Basophils Relative: 0 %
Eosinophils Absolute: 0.1 K/uL (ref 0.0–0.5)
Eosinophils Relative: 1 %
HCT: 42.5 % (ref 36.0–46.0)
Hemoglobin: 14 g/dL (ref 12.0–15.0)
Immature Granulocytes: 0 %
Lymphocytes Relative: 14 %
Lymphs Abs: 1.3 K/uL (ref 0.7–4.0)
MCH: 30.8 pg (ref 26.0–34.0)
MCHC: 32.9 g/dL (ref 30.0–36.0)
MCV: 93.4 fL (ref 80.0–100.0)
Monocytes Absolute: 0.6 K/uL (ref 0.1–1.0)
Monocytes Relative: 7 %
Neutro Abs: 6.7 K/uL (ref 1.7–7.7)
Neutrophils Relative %: 78 %
Platelets: 157 K/uL (ref 150–400)
RBC: 4.55 MIL/uL (ref 3.87–5.11)
RDW: 13 % (ref 11.5–15.5)
WBC: 8.7 K/uL (ref 4.0–10.5)
nRBC: 0 % (ref 0.0–0.2)

## 2024-01-15 LAB — MAGNESIUM: Magnesium: 2.4 mg/dL (ref 1.7–2.4)

## 2024-01-15 LAB — CBC
HCT: 41.3 % (ref 36.0–46.0)
Hemoglobin: 13.8 g/dL (ref 12.0–15.0)
MCH: 31.3 pg (ref 26.0–34.0)
MCHC: 33.4 g/dL (ref 30.0–36.0)
MCV: 93.7 fL (ref 80.0–100.0)
Platelets: 155 K/uL (ref 150–400)
RBC: 4.41 MIL/uL (ref 3.87–5.11)
RDW: 13 % (ref 11.5–15.5)
WBC: 10.5 K/uL (ref 4.0–10.5)
nRBC: 0 % (ref 0.0–0.2)

## 2024-01-15 LAB — COMPREHENSIVE METABOLIC PANEL WITH GFR
ALT: 14 U/L (ref 0–44)
AST: 23 U/L (ref 15–41)
Albumin: 3.6 g/dL (ref 3.5–5.0)
Alkaline Phosphatase: 66 U/L (ref 38–126)
Anion gap: 10 (ref 5–15)
BUN: 16 mg/dL (ref 8–23)
CO2: 27 mmol/L (ref 22–32)
Calcium: 9.3 mg/dL (ref 8.9–10.3)
Chloride: 104 mmol/L (ref 98–111)
Creatinine, Ser: 1.44 mg/dL — ABNORMAL HIGH (ref 0.44–1.00)
GFR, Estimated: 38 mL/min — ABNORMAL LOW (ref >60)
Glucose, Bld: 116 mg/dL — ABNORMAL HIGH (ref 70–99)
Potassium: 3.3 mmol/L — ABNORMAL LOW (ref 3.5–5.1)
Sodium: 141 mmol/L (ref 135–145)
Total Bilirubin: 0.5 mg/dL (ref 0.0–1.2)
Total Protein: 7.3 g/dL (ref 6.5–8.1)

## 2024-01-15 LAB — TROPONIN I (HIGH SENSITIVITY)
Troponin I (High Sensitivity): 6 ng/L (ref <18)
Troponin I (High Sensitivity): 5 ng/L (ref <18)

## 2024-01-15 LAB — LIPASE, BLOOD: Lipase: 43 U/L (ref 11–51)

## 2024-01-15 MED ORDER — NITROGLYCERIN 0.4 MG SL SUBL: 0.4000 mg | SUBLINGUAL_TABLET | SUBLINGUAL | Status: DC | PRN

## 2024-01-15 MED ORDER — CITALOPRAM HYDROBROMIDE 20 MG PO TABS
40.0000 mg | ORAL_TABLET | ORAL | Status: DC
  Administered 2024-01-15: 40 mg via ORAL
  Filled 2024-01-15: qty 2

## 2024-01-15 MED ORDER — MAGNESIUM HYDROXIDE 400 MG/5ML PO SUSP
30.0000 mL | Freq: Every day | ORAL | Status: DC | PRN
Start: 2024-01-15 — End: 2024-01-21
  Administered 2024-01-16: 30 mL via ORAL
  Filled 2024-01-15: qty 30

## 2024-01-15 MED ORDER — MORPHINE SULFATE (PF) 2 MG/ML IV SOLN: 2.0000 mg | INTRAVENOUS | Status: DC | PRN

## 2024-01-15 MED ORDER — ACETAMINOPHEN 325 MG PO TABS
650.0000 mg | ORAL_TABLET | Freq: Four times a day (QID) | ORAL | Status: DC | PRN
Start: 2024-01-15 — End: 2024-01-21
  Administered 2024-01-19 – 2024-01-20 (×3): 650 mg via ORAL
  Filled 2024-01-15 (×3): qty 2

## 2024-01-15 MED ORDER — CYANOCOBALAMIN 500 MCG PO TABS
2500.0000 ug | ORAL_TABLET | Freq: Every day | ORAL | Status: DC
  Administered 2024-01-15 – 2024-01-21 (×7): 2500 ug via ORAL
  Filled 2024-01-15 (×7): qty 5

## 2024-01-15 MED ORDER — OYSTER SHELL CALCIUM/D3 500-5 MG-MCG PO TABS
1.0000 | ORAL_TABLET | Freq: Every day | ORAL | Status: DC
  Administered 2024-01-15 – 2024-01-21 (×7): 1 via ORAL
  Filled 2024-01-15 (×7): qty 1

## 2024-01-15 MED ORDER — SODIUM CHLORIDE 0.9 % IV BOLUS
500.0000 mL | Freq: Once | INTRAVENOUS | Status: AC
  Administered 2024-01-15: 500 mL via INTRAVENOUS

## 2024-01-15 MED ORDER — ONDANSETRON HCL 4 MG/2ML IJ SOLN
4.0000 mg | Freq: Four times a day (QID) | INTRAMUSCULAR | Status: DC | PRN
  Administered 2024-01-19: 4 mg via INTRAVENOUS
  Filled 2024-01-15: qty 2

## 2024-01-15 MED ORDER — ALBUTEROL SULFATE (2.5 MG/3ML) 0.083% IN NEBU
2.5000 mg | INHALATION_SOLUTION | Freq: Four times a day (QID) | RESPIRATORY_TRACT | Status: DC | PRN
  Administered 2024-01-15 – 2024-01-16 (×2): 2.5 mg via RESPIRATORY_TRACT
  Filled 2024-01-15 (×3): qty 3

## 2024-01-15 MED ORDER — ENTECAVIR 0.5 MG PO TABS
0.5000 mg | ORAL_TABLET | Freq: Every day | ORAL | Status: DC
  Administered 2024-01-15 – 2024-01-19 (×5): 0.5 mg via ORAL
  Filled 2024-01-15 (×6): qty 1

## 2024-01-15 MED ORDER — DIPHENHYDRAMINE HCL 25 MG PO CAPS
25.0000 mg | ORAL_CAPSULE | Freq: Every day | ORAL | Status: DC
  Administered 2024-01-15 – 2024-01-20 (×6): 25 mg via ORAL
  Filled 2024-01-15 (×6): qty 1

## 2024-01-15 MED ORDER — TRAZODONE HCL 50 MG PO TABS
25.0000 mg | ORAL_TABLET | Freq: Every evening | ORAL | Status: DC | PRN
  Administered 2024-01-17 – 2024-01-20 (×4): 25 mg via ORAL
  Filled 2024-01-15 (×4): qty 1

## 2024-01-15 MED ORDER — METRONIDAZOLE 500 MG/100ML IV SOLN
500.0000 mg | Freq: Two times a day (BID) | INTRAVENOUS | Status: DC
  Administered 2024-01-15 – 2024-01-18 (×7): 500 mg via INTRAVENOUS
  Filled 2024-01-15 (×9): qty 100

## 2024-01-15 MED ORDER — ADULT MULTIVITAMIN W/MINERALS CH
1.0000 | ORAL_TABLET | Freq: Every day | ORAL | Status: DC
  Administered 2024-01-15 – 2024-01-21 (×7): 1 via ORAL
  Filled 2024-01-15 (×8): qty 1

## 2024-01-15 MED ORDER — CITALOPRAM HYDROBROMIDE 10 MG PO TABS
20.0000 mg | ORAL_TABLET | ORAL | Status: DC
  Administered 2024-01-16 – 2024-01-21 (×6): 20 mg via ORAL
  Filled 2024-01-15: qty 1
  Filled 2024-01-15 (×6): qty 2

## 2024-01-15 MED ORDER — ONDANSETRON HCL 4 MG PO TABS: 4.0000 mg | ORAL_TABLET | Freq: Four times a day (QID) | ORAL | Status: DC | PRN

## 2024-01-15 MED ORDER — IOHEXOL 300 MG/ML  SOLN
80.0000 mL | Freq: Once | INTRAMUSCULAR | Status: AC | PRN
  Administered 2024-01-15: 80 mL via INTRAVENOUS

## 2024-01-15 MED ORDER — POTASSIUM CHLORIDE CRYS ER 20 MEQ PO TBCR
20.0000 meq | EXTENDED_RELEASE_TABLET | Freq: Two times a day (BID) | ORAL | Status: AC
  Administered 2024-01-15 – 2024-01-17 (×4): 20 meq via ORAL
  Filled 2024-01-15 (×4): qty 1

## 2024-01-15 MED ORDER — ONDANSETRON HCL 4 MG/2ML IJ SOLN
4.0000 mg | INTRAMUSCULAR | Status: AC
  Administered 2024-01-15: 4 mg via INTRAVENOUS
  Filled 2024-01-15: qty 2

## 2024-01-15 MED ORDER — POTASSIUM CHLORIDE CRYS ER 20 MEQ PO TBCR: 20.0000 meq | EXTENDED_RELEASE_TABLET | Freq: Two times a day (BID) | ORAL | Status: DC

## 2024-01-15 MED ORDER — POTASSIUM CHLORIDE CRYS ER 20 MEQ PO TBCR
20.0000 meq | EXTENDED_RELEASE_TABLET | Freq: Every day | ORAL | Status: DC
  Administered 2024-01-15: 20 meq via ORAL
  Filled 2024-01-15: qty 1

## 2024-01-15 MED ORDER — FUROSEMIDE 40 MG PO TABS: 20.0000 mg | ORAL_TABLET | Freq: Every day | ORAL | Status: DC

## 2024-01-15 MED ORDER — MIDAZOLAM HCL 2 MG/2ML IJ SOLN
1.0000 mg | Freq: Once | INTRAMUSCULAR | Status: AC
  Administered 2024-01-15: 1 mg via INTRAVENOUS
  Filled 2024-01-15: qty 2

## 2024-01-15 MED ORDER — TIZANIDINE HCL 4 MG PO TABS
2.0000 mg | ORAL_TABLET | Freq: Three times a day (TID) | ORAL | Status: DC | PRN
  Administered 2024-01-16 – 2024-01-17 (×2): 2 mg via ORAL
  Filled 2024-01-15 (×2): qty 1

## 2024-01-15 MED ORDER — POTASSIUM CHLORIDE IN NACL 20-0.9 MEQ/L-% IV SOLN
INTRAVENOUS | Status: DC
  Filled 2024-01-15: qty 1000

## 2024-01-15 MED ORDER — SENNOSIDES-DOCUSATE SODIUM 8.6-50 MG PO TABS
1.0000 | ORAL_TABLET | Freq: Two times a day (BID) | ORAL | Status: AC
  Administered 2024-01-15 (×2): 1 via ORAL
  Filled 2024-01-15 (×2): qty 1

## 2024-01-15 MED ORDER — METOCLOPRAMIDE HCL 5 MG/ML IJ SOLN
10.0000 mg | INTRAMUSCULAR | Status: AC
  Administered 2024-01-15: 10 mg via INTRAVENOUS
  Filled 2024-01-15: qty 2

## 2024-01-15 MED ORDER — LACTATED RINGERS IV BOLUS
1000.0000 mL | Freq: Once | INTRAVENOUS | Status: AC
  Administered 2024-01-15: 1000 mL via INTRAVENOUS

## 2024-01-15 MED ORDER — SODIUM CHLORIDE 0.9 % IV SOLN
2.0000 g | INTRAVENOUS | Status: DC
  Administered 2024-01-15 – 2024-01-16 (×2): 2 g via INTRAVENOUS
  Filled 2024-01-15 (×2): qty 20

## 2024-01-15 MED ORDER — FERROUS SULFATE 325 (65 FE) MG PO TABS
325.0000 mg | ORAL_TABLET | Freq: Every day | ORAL | Status: DC
  Administered 2024-01-15 – 2024-01-21 (×7): 325 mg via ORAL
  Filled 2024-01-15 (×7): qty 1

## 2024-01-15 MED ORDER — ACETAMINOPHEN 650 MG RE SUPP: 650.0000 mg | Freq: Four times a day (QID) | RECTAL | Status: DC | PRN

## 2024-01-15 MED ORDER — ENOXAPARIN SODIUM 40 MG/0.4ML IJ SOSY
40.0000 mg | PREFILLED_SYRINGE | INTRAMUSCULAR | Status: DC
  Administered 2024-01-15 – 2024-01-21 (×7): 40 mg via SUBCUTANEOUS
  Filled 2024-01-15 (×6): qty 0.4

## 2024-01-15 MED ORDER — MORPHINE SULFATE (PF) 4 MG/ML IV SOLN
4.0000 mg | Freq: Once | INTRAVENOUS | Status: AC
  Administered 2024-01-15: 4 mg via INTRAVENOUS
  Filled 2024-01-15: qty 1

## 2024-01-15 MED ORDER — BIOTIN 5 MG PO CAPS: 5.0000 mg | ORAL_CAPSULE | Freq: Every day | ORAL | Status: DC

## 2024-01-15 MED ORDER — MORPHINE SULFATE (PF) 2 MG/ML IV SOLN
2.0000 mg | INTRAVENOUS | Status: DC | PRN
  Administered 2024-01-15 – 2024-01-16 (×3): 2 mg via INTRAVENOUS
  Filled 2024-01-15 (×3): qty 1

## 2024-01-15 MED ORDER — TRAMADOL HCL 50 MG PO TABS
50.0000 mg | ORAL_TABLET | Freq: Four times a day (QID) | ORAL | Status: DC | PRN
  Administered 2024-01-16 (×2): 50 mg via ORAL
  Filled 2024-01-15 (×2): qty 1

## 2024-01-15 MED ORDER — GABAPENTIN 300 MG PO CAPS
600.0000 mg | ORAL_CAPSULE | Freq: Three times a day (TID) | ORAL | Status: DC
  Administered 2024-01-15 – 2024-01-21 (×20): 600 mg via ORAL
  Filled 2024-01-15 (×20): qty 2

## 2024-01-15 NOTE — Plan of Care (Signed)

## 2024-01-15 NOTE — Progress Notes (Signed)
 PHARMACIST - PHYSICIAN ORDER COMMUNICATION  CONCERNING: P&T Medication Policy on Herbal Medications  DESCRIPTION:  This patient's order for:  Biotin  capsules  has been noted.  This product(s) is classified as an "herbal" or natural product. Due to a lack of definitive safety studies or FDA approval, nonstandard manufacturing practices, plus the potential risk of unknown drug-drug interactions while on inpatient medications, the Pharmacy and Therapeutics Committee does not permit the use of "herbal" or natural products of this type within [facility].   ACTION TAKEN: The pharmacy department is unable to verify this order at this time and your patient has been informed of this safety policy. Please reevaluate patient's clinical condition at discharge and address if the herbal or natural product(s) should be resumed at that time.

## 2024-01-15 NOTE — ED Notes (Signed)
 Pt placed on bedpan at this time.

## 2024-01-15 NOTE — Assessment & Plan Note (Signed)
-   The patient will be admitted to a cardiac telemetry bed. - Will continue antibiotic therapy with IV Rocephin  and Flagyl . - Will continue hydration with IV normal saline. - Pain management will be provided. - As needed antiemetics will be provided.

## 2024-01-15 NOTE — Consult Note (Signed)
 CARDIOLOGY CONSULT NOTE               Patient ID: Erin [name] MRN: [id number] DOB/AGE: May 24, 1946 78 y.o.  Admit date: 01/14/2024 Referring Physician Dr. Norleen Peaches hospitalist Primary Physician Carlin Blamer [facility] primary Primary Cardiologist  Reason for Consultation chest pain  HPI: 78 year old female significant history of asthma anxiety stage III B-cell lymphoma in remission diverticulitis gastric ulcer hypertension presented after a fall at home she is not sure if she blacked out or passed out injuring her ankle patient also had abdominal discomfort with nausea vomiting patient complains of mid chest wall tenderness to palpation no previous cardiac history no lower extremity swelling or edema denies any fever chills or sweats or coughing.  Complains of the morphine  is caused him to have worsening chest pain  Review of systems complete and found to be negative unless listed above     Past Medical History:  Diagnosis Date   Acute URI    Allergy    Anxiety    Asthma    well controlled   Cancer (HCC)    lymphoma in remission   Diverticulosis    DVT of upper extremity (deep vein thrombosis) (HCC) 07/2008 & 12/2007   Gastric ulcer 10/2007   EGD   GERD (gastroesophageal reflux disease)    Hepatitis B    treated   History of hiatal hernia    Hypertension    Laboratory confirmed diagnosis of COVID-19 06/27/2019   Lower extremity edema    Lymphoma (HCC)    Stage 3 large B cell lymphoma    Past Surgical History:  Procedure Laterality Date   ANTERIOR AND POSTERIOR REPAIR N/A 03/17/2020   Procedure: ANTERIOR (CYSTOCELE) AND POSTERIOR REPAIR (RECTOCELE);  Surgeon: Connell Davies, MD;  Location: [facility] ORS;  Service: Gynecology;  Laterality: N/A;   CHOLECYSTECTOMY     COLONOSCOPY  11/23/2007, 05/12/1994   COLONOSCOPY WITH PROPOFOL  N/A 03/31/2018   Procedure: COLONOSCOPY WITH PROPOFOL ;  Surgeon: Viktoria Lamar DASEN, MD;  Location: [facility] ENDOSCOPY;  Service:  Endoscopy;  Laterality: N/A;   ESOPHAGOGASTRODUODENOSCOPY  09/03/2009, 10/14/2008, 09/30/2008, 11/23/2007   HEMORRHOID SURGERY     LAPAROSCOPIC VAGINAL HYSTERECTOMY WITH SALPINGO OOPHORECTOMY Bilateral 03/17/2020   Procedure: LAPAROSCOPIC ASSISTED VAGINAL HYSTERECTOMY WITH SALPINGO OOPHORECTOMY;  Surgeon: Connell Davies, MD;  Location: [facility] ORS;  Service: Gynecology;  Laterality: Bilateral;   LIVER BIOPSY  01/27/1996   PORTOCAVAL SHUNT PLACEMENT     and removal   PUBOVAGINAL SLING N/A 03/17/2020   Procedure: PUBO-VAGINAL SLING-TVT;  Surgeon: Connell Davies, MD;  Location: [facility] ORS;  Service: Gynecology;  Laterality: N/A;    (Not in a hospital admission)  Social History   Socioeconomic History   Marital status: Divorced    Spouse name: Not on file   Number of children: Not on file   Years of education: Not on file   Highest education level: Not on file  Occupational History   Not on file  Tobacco Use   Smoking status: Never   Smokeless tobacco: Never  Vaping Use   Vaping status: Never Used  Substance and Sexual Activity   Alcohol use: No   Drug use: Never   Sexual activity: Not Currently  Other Topics Concern   Not on file  Social History Narrative   Independent at baseline, ambulates without any support   Lives by herself   Social Drivers of Corporate investment banker Strain: Not on file  Food Insecurity: No Food Insecurity (01/15/2024)  Hunger Vital Sign    Worried About Running Out of Food in the Last Year: Never true    Ran Out of Food in the Last Year: Never true  Transportation Needs: No Transportation Needs (01/15/2024)   PRAPARE - Administrator, Civil Service (Medical): No    Lack of Transportation (Non-Medical): No  Physical Activity: Not on file  Stress: Not on file  Social Connections: Moderately Isolated (01/15/2024)   Social Connection and Isolation Panel    Frequency of Communication with Friends and Family: Twice a week    Frequency of Social  Gatherings with Friends and Family: Twice a week    Attends Religious Services: 1 to 4 times per year    Active Member of [employer] or Organizations: No    Attends Banker Meetings: Never    Marital Status: Divorced  Catering manager Violence: Not At Risk (01/15/2024)   Humiliation, Afraid, Rape, and Kick questionnaire    Fear of Current or Ex-Partner: No    Emotionally Abused: No    Physically Abused: No    Sexually Abused: No    Family History  Problem Relation Age of Onset   Cancer Daughter    Asthma Mother    Breast cancer Neg Hx       Review of systems complete and found to be negative unless listed above      PHYSICAL EXAM  General: Well developed, well nourished, in no acute distress HEENT:  Normocephalic and atramatic Neck:  No JVD.  Lungs: Clear bilaterally to auscultation and percussion. Heart: HRRR . Normal S1 and S2 without gallops or murmurs.  Abdomen: Bowel sounds are positive, abdomen soft and non-tender  Msk:  Back normal, normal gait. Normal strength and tone for age. Extremities: No clubbing, cyanosis or edema.  Ankle fracture bandage Neuro: Alert and oriented X 3. Psych:  Good affect, responds appropriately  Labs:   Lab Results  Component Value Date   WBC 10.5 01/15/2024   HGB 13.8 01/15/2024   HCT 41.3 01/15/2024   MCV 93.7 01/15/2024   PLT 155 01/15/2024    Recent Labs  Lab 01/15/24 0028 01/15/24 0430  NA 141 139  K 3.3* 3.2*  CL 104 107  CO2 27 26  BUN 16 18  CREATININE 1.44* 1.54*  CALCIUM  9.3 8.7*  PROT 7.3  --   BILITOT 0.5  --   ALKPHOS 66  --   ALT 14  --   AST 23  --   GLUCOSE 116* 133*   Lab Results  Component Value Date   CKTOTAL 279 (H) 05/24/2012   CKMB 1.6 05/24/2012   TROPONINI <0.03 08/06/2018   No results found for: CHOL No results found for: HDL No results found for: LDLCALC No results found for: TRIG No results found for: CHOLHDL No results found for: LDLDIRECT    Radiology: CT  ABDOMEN PELVIS W CONTRAST Result Date: 01/15/2024 CLINICAL DATA:  Abdominal pain EXAM: CT ABDOMEN AND PELVIS WITH CONTRAST TECHNIQUE: Multidetector CT imaging of the abdomen and pelvis was performed using the standard protocol following bolus administration of intravenous contrast. RADIATION DOSE REDUCTION: This exam was performed according to the departmental dose-optimization program which includes automated exposure control, adjustment of the mA and/or kV according to patient size and/or use of iterative reconstruction technique. CONTRAST:  80mL OMNIPAQUE  IOHEXOL  300 MG/ML  SOLN COMPARISON:  11/06/2022 FINDINGS: Lower Chest: Normal. Hepatobiliary: Normal hepatic contours. No intra- or extrahepatic biliary dilatation. Status post cholecystectomy. Pancreas:  Normal pancreas. No ductal dilatation or peripancreatic fluid collection. Spleen: Normal. Adrenals/Urinary Tract: The adrenal glands are normal. No hydronephrosis, nephroureterolithiasis or solid renal mass. The urinary bladder is normal for degree of distention Stomach/Bowel: There is no hiatal hernia. Normal duodenal course and caliber. No small bowel dilatation or inflammation. There is mild inflammatory change adjacent to the distal descending colon. Normal appendix. Vascular/Lymphatic: There is calcific atherosclerosis of the abdominal aorta. No lymphadenopathy. Reproductive: Status post hysterectomy. No adnexal mass. Other: None. Musculoskeletal: Grade 1 anterolisthesis at L4-5. IMPRESSION: Mild inflammatory change adjacent to the distal descending colon, which may indicate mild colitis. Electronically Signed   By: Franky Stanford M.D.   On: 01/15/2024 01:47   DG Chest Port 1 View Result Date: 01/14/2024 CLINICAL DATA:  Status post fall. EXAM: PORTABLE CHEST 1 VIEW COMPARISON:  January 10, 2024 FINDINGS: There is stable right-sided venous Port-A-Cath positioning. The heart size and mediastinal contours are within normal limits. Both lungs are clear.  Multiple chronic bilateral rib fractures are seen. Multilevel degenerative changes are present throughout the thoracic spine. IMPRESSION: No active cardiopulmonary disease. Electronically Signed   By: Suzen Dials M.D.   On: 01/14/2024 23:38   DG Ankle Complete Left Result Date: 01/14/2024 CLINICAL DATA:  Status post fall. EXAM: LEFT ANKLE COMPLETE - 3+ VIEW COMPARISON:  None Available. FINDINGS: Acute nondisplaced fracture deformity is seen involving the left lateral malleolus. There is no evidence of dislocation. An additional fracture deformity of indeterminate age is seen involving the distal aspect of the fifth left metatarsal. Mild anterior and lateral soft tissue swelling is noted. IMPRESSION: Acute nondisplaced fracture of the left lateral malleolus. Electronically Signed   By: Suzen Dials M.D.   On: 01/14/2024 23:37   DG Chest 2 View Result Date: 01/10/2024 CLINICAL DATA:  dyspnea EXAM: CHEST - 2 VIEW COMPARISON:  May 21, 2023 FINDINGS: Similarly positioned right chest port, terminating at the cavoatrial junction. Streaky left basilar atelectasis. No focal airspace consolidation, pleural effusion, or pneumothorax. No cardiomegaly. No acute fracture or destructive lesions. Multilevel thoracic osteophytosis. Bilateral glenohumeral joint osteoarthritis. IMPRESSION: No acute cardiopulmonary abnormality. Electronically Signed   By: Rogelia Myers M.D.   On: 01/10/2024 12:10    EKG: Normal sinus rhythm low voltage nonspecific ST-T wave changes  ASSESSMENT AND PLAN:  Chest pain/costochondritis Possible syncope Acute colitis GERD Anxiety Chronic asthma Ankle fracture Bilateral occipital neuralgia  . Agree with admit for management on telemetry follow-up EKGs troponins Consider functional study echocardiogram prior to discharge Atypical chest pain with troponins negative and echo unremarkable will recommend limited conservative cardiac as an inpatient further workup can be  pursued as an outpatient Agree with therapy for acute colitis PPI therapy for reflux type symptoms Orthopedic for ankle fracture  Signed: Cara JONETTA Lovelace MD 01/15/2024, 10:07 AM

## 2024-01-15 NOTE — ED Notes (Signed)
 Gordan MD made aware pt c/o left sided chest pain. EKG obtained and given to MD.

## 2024-01-15 NOTE — ED Notes (Signed)
 Gordan MD made aware pt c/o pain 8/10.

## 2024-01-15 NOTE — H&P (Signed)
 [place]   PATIENT NAME: Erin [name]    MR#:  [id number]  DATE OF BIRTH:  [birth year]  DATE OF ADMISSION:  01/14/2024  PRIMARY CARE PHYSICIAN: [facility]   Patient is coming from: Home  REQUESTING/REFERRING PHYSICIAN: Gordan Huxley, MD  CHIEF COMPLAINT:   Chief Complaint  Patient presents with   Abdominal Pain    HISTORY OF PRESENT ILLNESS:  Erin [name] is a 78 y.o. female with medical history significant for anxiety, asthma, stage III large B cell lymphoma in remission, diverticulosis, gastric ulcer and essential hypertension, who presented to the emergency room with a Kalisetti of lower abdominal pain extending to her umbilical area with associated nausea and vomiting as well as constipation.  She denies any fever or chills.  She admitted to chest pain felt as pressure and graded 8/10 in severity with no radiation.  While getting up to go to the bathroom she fell and had subsequent left ankle fracture.  No dysuria, oliguria or hematuria or flank pain.  No paresthesias or focal muscle weakness.  She denies any presyncope or syncope.  No bilious vomitus or hematemesis.  No bright red bleeding per rectum or melena.  ED Course: When she came to the ER vital signs were within normal.  Labs revealed hypokalemia of 3.3 and creatinine 1.44 with otherwise normal CMP.  CBC showed no significant abnormalities.  UA was unremarkable. EKG as reviewed by me : EKG showed sinus rhythm with rate of 83 with poor R wave progression. Imaging: Left ankle x-ray showed acute nondisplaced fracture of the left lateral malleolus. Portable chest x-ray showed no acute cardiopulmonary disease.  Abdominal and pelvic CT scan revealed mild inflammatory changes adjacent to the distal descending colon that may indicate mild colitis.  The patient was given 1 L bolus of IV lactated Ringer 's, 4 mg IV Zofran , 4 mg of IV morphine  sulfate 1 mg of IV Versed , 10 mg of IV Reglan  and  500 mL IV normal saline bolus.  I added IV Rocephin  and Flagyl  for her colitis.  She will be admitted to a cardiac telemetry bed for further evaluation and management.  Hospital PAST MEDICAL HISTORY:   Past Medical History:  Diagnosis Date   Acute URI    Allergy    Anxiety    Asthma    well controlled   Cancer (HCC)    lymphoma in remission   Diverticulosis    DVT of upper extremity (deep vein thrombosis) (HCC) 07/2008 & 12/2007   Gastric ulcer 10/2007   EGD   GERD (gastroesophageal reflux disease)    Hepatitis B    treated   History of hiatal hernia    Hypertension    Laboratory confirmed diagnosis of COVID-19 06/27/2019   Lower extremity edema    Lymphoma (HCC)    Stage 3 large B cell lymphoma    PAST SURGICAL HISTORY:   Past Surgical History:  Procedure Laterality Date   ANTERIOR AND POSTERIOR REPAIR N/A 03/17/2020   Procedure: ANTERIOR (CYSTOCELE) AND POSTERIOR REPAIR (RECTOCELE);  Surgeon: Connell Davies, MD;  Location: [facility] ORS;  Service: Gynecology;  Laterality: N/A;   CHOLECYSTECTOMY     COLONOSCOPY  11/23/2007, 05/12/1994   COLONOSCOPY WITH PROPOFOL  N/A 03/31/2018   Procedure: COLONOSCOPY WITH PROPOFOL ;  Surgeon: Viktoria Lamar DASEN, MD;  Location: [facility] ENDOSCOPY;  Service: Endoscopy;  Laterality: N/A;   ESOPHAGOGASTRODUODENOSCOPY  09/03/2009, 10/14/2008, 09/30/2008, 11/23/2007   HEMORRHOID SURGERY     LAPAROSCOPIC VAGINAL  HYSTERECTOMY WITH SALPINGO OOPHORECTOMY Bilateral 03/17/2020   Procedure: LAPAROSCOPIC ASSISTED VAGINAL HYSTERECTOMY WITH SALPINGO OOPHORECTOMY;  Surgeon: Connell Davies, MD;  Location: [facility] ORS;  Service: Gynecology;  Laterality: Bilateral;   LIVER BIOPSY  01/27/1996   PORTOCAVAL SHUNT PLACEMENT     and removal   PUBOVAGINAL SLING N/A 03/17/2020   Procedure: PUBO-VAGINAL SLING-TVT;  Surgeon: Connell Davies, MD;  Location: [facility] ORS;  Service: Gynecology;  Laterality: N/A;    SOCIAL HISTORY:   Social History   Tobacco Use   Smoking status: Never    Smokeless tobacco: Never  Substance Use Topics   Alcohol use: No    FAMILY HISTORY:   Family History  Problem Relation Age of Onset   Cancer Daughter    Asthma Mother    Breast cancer Neg Hx     DRUG ALLERGIES:   Allergies  Allergen Reactions   Penicillins Rash    Has patient had a PCN reaction causing immediate rash, facial/tongue/throat swelling, SOB or lightheadedness with hypotension: Yes Has patient had a PCN reaction causing severe rash involving mucus membranes or skin necrosis: No Has patient had a PCN reaction that required hospitalization: No Has patient had a PCN reaction occurring within the last 10 years: No If all of the above answers are NO, then may proceed with Cephalosporin use.   Prednisone Hives   Sulfa Antibiotics Hives and Shortness Of Breath   Enalapril     Hypotension    Medrol  [Methylprednisolone ] Rash    REVIEW OF SYSTEMS:   ROS As per history of present illness. All pertinent systems were reviewed above. Constitutional, HEENT, cardiovascular, respiratory, GI, GU, musculoskeletal, neuro, psychiatric, endocrine, integumentary and hematologic systems were reviewed and are otherwise negative/unremarkable except for positive findings mentioned above in the HPI.   MEDICATIONS AT HOME:   Prior to Admission medications   Medication Sig Start Date End Date Taking? Authorizing Provider  acetaminophen  (TYLENOL ) 325 MG tablet Take 325-650 mg by mouth every 6 (six) hours as needed for moderate pain or headache.    [provider]  albuterol  (PROVENTIL  HFA;VENTOLIN  HFA) 108 (90 Base) MCG/ACT inhaler Inhale 1-2 puffs into the lungs every 6 (six) hours as needed for wheezing or shortness of breath. Patient taking differently: Inhale 2 puffs into the lungs every 4 (four) hours as needed for wheezing or shortness of breath. 10/08/17   Salary, Nemiah BIRCH, MD  albuterol  (PROVENTIL ) (2.5 MG/3ML) 0.083% nebulizer solution Take 2.5 mg by nebulization every 6  (six) hours as needed for wheezing or shortness of breath.    [provider]  alendronate  (FOSAMAX ) 70 MG tablet Take 70 mg by mouth every Friday.  03/26/15   [provider]  aspirin  81 MG chewable tablet Chew 81 mg by mouth daily.    [provider]  Biotin  5 MG CAPS Take 5 mg by mouth daily.    [provider]  Calcium  Carb-Cholecalciferol  (CALCIUM  600 + D PO) Take 1 tablet by mouth daily.    [provider]  Carboxymethylcellulose Sodium (LUBRICANT EYE DROPS OP) Place 1 drop into both eyes daily as needed (dry eyes).    [provider]  citalopram  (CELEXA ) 40 MG tablet Take 40 mg by mouth every morning.  04/30/19   [provider]  Cyanocobalamin  (B-12) 2500 MCG TABS Take 2,500 mcg by mouth daily.    [provider]  diphenhydrAMINE  (BENADRYL ) 25 MG tablet Take 25 mg by mouth at bedtime.     [provider]  docusate sodium  (COLACE) 100 MG capsule Take 1 capsule (100 mg total) by mouth 2 (two) times daily as needed. Patient not taking: Reported on 03/10/2023 03/18/20   Connell Davies, MD  doxycycline  (VIBRAMYCIN ) 100 MG capsule Take 1 capsule (100 mg total) by mouth 2 (two) times daily. Patient not taking: Reported on 03/10/2023 01/16/23   Herlinda Belton B, FNP  entecavir  (BARACLUDE ) 0.5 MG tablet Take 0.5 mg by mouth every morning.     [provider]  ferrous sulfate  (FERROUSUL) 325 (65 FE) MG tablet Take 1 tablet (325 mg total) by mouth daily with breakfast. 03/18/20   Connell Davies, MD  furosemide  (LASIX ) 20 MG tablet Take 20 mg by mouth daily. 07/22/18   [provider]  gabapentin  (NEURONTIN ) 600 MG tablet Take 600 mg by mouth 3 (three) times daily.  12/26/18   [provider]  lidocaine  (LIDODERM ) 5 % Place 1 patch onto the skin daily. 12/02/22   [provider]  mometasone -formoterol  (DULERA ) 200-5 MCG/ACT AERO Inhale 2 puffs into the lungs 2 (two) times daily. 10/05/18   Sherial Bail, MD  montelukast  (SINGULAIR ) 10 MG tablet Take 1 tablet (10 mg total) by mouth at bedtime. Patient not taking: Reported on 03/10/2023 10/13/20   Brahmanday, Govinda R, MD  Multiple Vitamin (MULTIVITAMIN WITH MINERALS) TABS tablet Take 1 tablet by mouth daily.    [provider]  omeprazole (PRILOSEC) 20 MG capsule Take 20 mg by mouth 2 (two) times daily.  Patient not taking: Reported on 03/10/2023    [provider]  potassium chloride  SA (KLOR-CON ) 20 MEQ tablet Take 1 tablet (20 mEq total) by mouth daily. 06/16/20   Brahmanday, Govinda R, MD  simethicone  (GAS-X) 80 MG chewable tablet Chew 1 tablet (80 mg total) by mouth 4 (four) times daily as needed for flatulence. Patient not taking: Reported on 03/10/2023 03/18/20 10/12/21  Connell Davies, MD  tiZANidine  (ZANAFLEX ) 2 MG tablet Take 2 mg by mouth 3 (three) times daily as needed for muscle spasms.     [provider]  traMADol  (ULTRAM ) 50 MG tablet Take 1 tablet (50 mg total) by mouth every 6 (six) hours as needed. 01/16/23   Triplett, Belton B, FNP  warfarin (COUMADIN ) 1 MG tablet TAKE ONE TABLET BY MOUTH EVERY DAY AT 6PM Patient not taking: Reported on 03/10/2023 06/29/17   [name], MD      VITAL SIGNS:  Blood pressure 125/63, pulse 92, temperature 99 F (37.2 C), temperature source Oral, resp. rate 19, SpO2 100%.  PHYSICAL EXAMINATION:  Physical Exam  GENERAL:  78 y.o.-year-old Caucasian female patient lying in the bed with no acute distress.  EYES: Pupils equal, round, reactive to light and accommodation. No scleral icterus. Extraocular muscles intact.  HEENT: Head atraumatic, normocephalic. Oropharynx and nasopharynx clear.  NECK:  Supple, no jugular venous distention. No thyroid enlargement, no tenderness.  LUNGS: Normal breath sounds bilaterally, no wheezing, rales,rhonchi or crepitation. No use of accessory muscles of respiration.  CARDIOVASCULAR: Regular rate and rhythm, S1, S2 normal. No  murmurs, rubs, or gallops.  ABDOMEN: Soft, nondistended, with epigastric and left upper quadrant mild tenderness without rebound tenderness guarding rigidity. Bowel sounds present. No organomegaly or mass.  EXTREMITIES: No pedal edema, cyanosis, or clubbing.  NEUROLOGIC: Cranial nerves II through XII are intact. Muscle strength 5/5 in all extremities. Sensation intact. Gait not checked.  PSYCHIATRIC: The patient is alert and oriented x 3.  Normal affect and good eye contact. SKIN: No obvious rash,  lesion, or ulcer.   LABORATORY PANEL:   CBC Recent Labs  Lab 01/15/24 0430  WBC 10.5  HGB 13.8  HCT 41.3  PLT 155   ------------------------------------------------------------------------------------------------------------------  Chemistries  Recent Labs  Lab 01/15/24 0028 01/15/24 0430  NA 141 139  K 3.3* 3.2*  CL 104 107  CO2 27 26  GLUCOSE 116* 133*  BUN 16 18  CREATININE 1.44* 1.54*  CALCIUM  9.3 8.7*  AST 23  --   ALT 14  --   ALKPHOS 66  --   BILITOT 0.5  --    ------------------------------------------------------------------------------------------------------------------  Cardiac Enzymes No results for input(s): TROPONINI in the last 168 hours. ------------------------------------------------------------------------------------------------------------------  RADIOLOGY:  CT ABDOMEN PELVIS W CONTRAST Result Date: 01/15/2024 CLINICAL DATA:  Abdominal pain EXAM: CT ABDOMEN AND PELVIS WITH CONTRAST TECHNIQUE: Multidetector CT imaging of the abdomen and pelvis was performed using the standard protocol following bolus administration of intravenous contrast. RADIATION DOSE REDUCTION: This exam was performed according to the departmental dose-optimization program which includes automated exposure control, adjustment of the mA and/or kV according to patient size and/or use of iterative reconstruction technique. CONTRAST:  80mL OMNIPAQUE  IOHEXOL  300 MG/ML  SOLN COMPARISON:   11/06/2022 FINDINGS: Lower Chest: Normal. Hepatobiliary: Normal hepatic contours. No intra- or extrahepatic biliary dilatation. Status post cholecystectomy. Pancreas: Normal pancreas. No ductal dilatation or peripancreatic fluid collection. Spleen: Normal. Adrenals/Urinary Tract: The adrenal glands are normal. No hydronephrosis, nephroureterolithiasis or solid renal mass. The urinary bladder is normal for degree of distention Stomach/Bowel: There is no hiatal hernia. Normal duodenal course and caliber. No small bowel dilatation or inflammation. There is mild inflammatory change adjacent to the distal descending colon. Normal appendix. Vascular/Lymphatic: There is calcific atherosclerosis of the abdominal aorta. No lymphadenopathy. Reproductive: Status post hysterectomy. No adnexal mass. Other: None. Musculoskeletal: Grade 1 anterolisthesis at L4-5. IMPRESSION: Mild inflammatory change adjacent to the distal descending colon, which may indicate mild colitis. Electronically Signed   By: Franky Stanford M.D.   On: 01/15/2024 01:47   DG Chest Port 1 View Result Date: 01/14/2024 CLINICAL DATA:  Status post fall. EXAM: PORTABLE CHEST 1 VIEW COMPARISON:  January 10, 2024 FINDINGS: There is stable right-sided venous Port-A-Cath positioning. The heart size and mediastinal contours are within normal limits. Both lungs are clear. Multiple chronic bilateral rib fractures are seen. Multilevel degenerative changes are present throughout the thoracic spine. IMPRESSION: No active cardiopulmonary disease. Electronically Signed   By: Suzen Dials M.D.   On: 01/14/2024 23:38   DG Ankle Complete Left Result Date: 01/14/2024 CLINICAL DATA:  Status post fall. EXAM: LEFT ANKLE COMPLETE - 3+ VIEW COMPARISON:  None Available. FINDINGS: Acute nondisplaced fracture deformity is seen involving the left lateral malleolus. There is no evidence of dislocation. An additional fracture deformity of indeterminate age is seen involving the  distal aspect of the fifth left metatarsal. Mild anterior and lateral soft tissue swelling is noted. IMPRESSION: Acute nondisplaced fracture of the left lateral malleolus. Electronically Signed   By: Suzen Dials M.D.   On: 01/14/2024 23:37      IMPRESSION AND PLAN:  Assessment and Plan: * Acute colitis - The patient will be admitted to a cardiac telemetry bed. - Will continue antibiotic therapy with IV Rocephin  and Flagyl . - Will continue hydration with IV normal saline. - Pain management will be provided. - As needed antiemetics will be provided.  Chest pain - Will follow serial troponins and EKGs.  I suspect atypical etiology likely GI related. - The patient will  be placed on  p.r.n. sublingual nitroglycerin  and morphine  sulfate for pain. - We will obtain a cardiology consult in a.m. for further cardiac risk stratification. - I notified Dr. Florencio about the patient.   GERD without esophagitis - The patient has a history of peptic ulcer disease. - Will continue PPI therapy.  Anxiety and depression - Will continue Celexa .  Chronic asthma - Will continue her inhalers.  Bilateral occipital neuralgia - Will continue Neurontin .   DVT prophylaxis: Lovenox . Advanced Care Planning:  Code Status: full code. Family Communication:  The plan of care was discussed in details with the patient (and family). I answered all questions. The patient agreed to proceed with the above mentioned plan. Further management will depend upon hospital course. Disposition Plan: Back to previous home environment Consults called: none. All the records are reviewed and case discussed with ED provider.  Status is: Inpatient  At the time of the admission, it appears that the appropriate admission status for this patient is inpatient.  This is judged to be reasonable and necessary in order to provide the required intensity of service to ensure the patient's safety given the presenting symptoms,  physical exam findings and initial radiographic and laboratory data in the context of comorbid conditions.  The patient requires inpatient status due to high intensity of service, high risk of further deterioration and high frequency of surveillance required.  I certify that at the time of admission, it is my clinical judgment that the patient will require inpatient hospital care extending more than 2 midnights.                            Dispo: The patient is from: Home              Anticipated d/c is to: Home              Patient currently is not medically stable to d/c.              Difficult to place patient: No  [place]ison DELENA Peaches M.D on 01/15/2024 at 5:56 AM  [facility]   From 7 PM-7 AM, contact night-coverage [email or web address]  CC: Primary care physician; [facility]

## 2024-01-15 NOTE — Assessment & Plan Note (Signed)
Will continue Celexa

## 2024-01-15 NOTE — Progress Notes (Signed)
 Progress Note    Erin [name]  FMW:[id number] DOB: 02-06-1946  DOA: 01/14/2024 PCP: [facility]      Brief Narrative:    Medical records reviewed and are as summarized below:  Erin [name] is a 78 y.o. female  with medical history significant for anxiety, asthma, stage III large B cell lymphoma in remission, diverticulosis, gastric ulcer and essential hypertension, who presented to the ED because of left-sided lower abdominal pain, periumbilical pain, nausea, vomiting and constipation.  She has had constipation for over a week.  She also had chest pain that she described as pressure in sensation and was graded as 8/10 in severity.  She said she fell while going to the bathroom and developed severe pain in the left ankle.  Reportedly when EMS picked her up, she was hypotensive with BP of 78/52, temperature 97.6 F, heart rate 78. In the ED, temperature was 99.3 per rectum, respiratory rate 12, heart rate 80 and O2 sat 98% on room air.   CT abdomen and pelvis with contrast: IMPRESSION: Mild inflammatory change adjacent to the distal descending colon, which may indicate mild colitis.  X-ray of left ankle: Acute nondisplaced fracture of the left lateral malleolus  Chest x-ray was unremarkable EKG showed normal sinus rhythm   Assessment/Plan:   Principal Problem:   Acute colitis Active Problems:   Chest pain   GERD without esophagitis   Anxiety and depression   CKD (chronic kidney disease) stage 3, GFR 30-59 ml/min (HCC)   Bilateral occipital neuralgia   Chronic asthma    Body mass index is 29.18 kg/m.   Acute colitis: Continue IV ceftriaxone  and Flagyl .  Analgesics as needed for pain.  Continue clear liquid diet.  Discontinue IV fluids. Discontinue Lasix    Chest pain: Resolved.  Troponins negative.  EKG unremarkable.  Follow-up with cardiologist.   S/p mechanical fall, acute nondisplaced fracture of the left lateral  malleolus: Consulted Dr. Cleotilde, orthopedic surgeon on-call.  He recommended a splint, nonweightbearing on the left foot and outpatient follow-up. Consult PT and OT   Hypokalemia: Replete potassium and monitor levels CKD stage IIIb: Creatinine is stable  Comorbidities include anxiety, depression, asthma, bilateral occipital neuralgia, chronic hep B infection   Patient is on oxygen but I do not think she needs it.  Wean off oxygen as able.  Plan of care discussed with Harlene, RN, at the bedside   Diet Order             Diet clear liquid Room service appropriate? Yes; Fluid consistency: Thin  Diet effective now                            Consultants: Orthopedic surgeon  Procedures: None    Medications:    calcium -vitamin D   1 tablet Oral Daily   citalopram   40 mg Oral [id number]   cyanocobalamin   2,500 mcg Oral Daily   diphenhydrAMINE   25 mg Oral QHS   enoxaparin  (LOVENOX ) injection  40 mg Subcutaneous Q24H   entecavir   0.5 mg Oral Daily   ferrous sulfate   325 mg Oral Q breakfast   gabapentin   600 mg Oral TID   multivitamin with minerals  1 tablet Oral Daily   potassium chloride  SA  20 mEq Oral BID   senna-docusate  1 tablet Oral BID   Continuous Infusions:  cefTRIAXone  (ROCEPHIN )  IV Stopped (01/15/24 0651)   metronidazole  Stopped (01/15/24 0755)  Anti-infectives (From admission, onward)    Start     Dose/Rate Route Frequency Ordered Stop   01/15/24 1500  entecavir  (BARACLUDE ) tablet 0.5 mg        0.5 mg Oral Daily 01/15/24 0401     01/15/24 0600  cefTRIAXone  (ROCEPHIN ) 2 g in sodium chloride  0.9 % 100 mL IVPB        2 g 200 mL/hr over 30 Minutes Intravenous Every 24 hours 01/15/24 0553     01/15/24 0600  metroNIDAZOLE  (FLAGYL ) IVPB 500 mg        500 mg 100 mL/hr over 60 Minutes Intravenous Every 12 hours 01/15/24 0553                Family Communication/Anticipated D/C date and plan/Code Status   DVT prophylaxis: enoxaparin   (LOVENOX ) injection 40 mg Start: 01/15/24 0800     Code Status: Full Code  Family Communication: None Disposition Plan: May need SNF at discharge   Status is: Inpatient Remains inpatient appropriate because: Acute colitis, left foot fracture       Subjective:   Interval events noted.  She complains of nausea, constipation and left-sided abdominal pain.  Chest pain has resolved.  No shortness of breath.  Objective:    Vitals:   01/15/24 0656 01/15/24 0700 01/15/24 0931 01/15/24 1128  BP:  (!) 124/57  (!) 117/92  Pulse:  84  89  Resp:  17  18  Temp:   98.3 F (36.8 C) 98.3 F (36.8 C)  TempSrc:   Oral Oral  SpO2:  100%  95%  Height: 5' 4 (1.626 m)      No data found.   Intake/Output Summary (Last 24 hours) at 01/15/2024 1209 Last data filed at 01/15/2024 0755 Gross per 24 hour  Intake 1700 ml  Output --  Net 1700 ml   There were no vitals filed for this visit.  Exam:  GEN: NAD SKIN: Warm and dry EYES: No pallor or icterus ENT: MMM CV: RRR PULM: CTA B ABD: soft, ND, tenderness left lower quadrant, no rebound tenderness or guarding, +BS CNS: AAO x 3, non focal EXT: Left leg swelling and tenderness posteriorly. Left ankle tenderness with bruising on the lateral ankle      Data Reviewed:   I have personally reviewed following labs and imaging studies:  Labs: Labs show the following:   Basic Metabolic Panel: Recent Labs  Lab 01/10/24 1046 01/15/24 0028 01/15/24 0430  NA 138 141 139  K 3.7 3.3* 3.2*  CL 101 104 107  CO2 28 27 26   GLUCOSE 96 116* 133*  BUN 19 16 18   CREATININE 1.38* 1.44* 1.54*  CALCIUM  9.2 9.3 8.7*   GFR Estimated Creatinine Clearance: 31.3 mL/min (A) (by C-G formula based on SCr of 1.54 mg/dL (H)). Liver Function Tests: Recent Labs  Lab 01/10/24 1046 01/15/24 0028  AST 20 23  ALT 12 14  ALKPHOS 86 66  BILITOT 0.4 0.5  PROT 7.3 7.3  ALBUMIN 3.8 3.6   Recent Labs  Lab 01/15/24 0028  LIPASE 43   No results  for input(s): AMMONIA in the last 168 hours. Coagulation profile Recent Labs  Lab 01/10/24 1046 01/14/24 2300  INR 0.9 1.4*    CBC: Recent Labs  Lab 01/10/24 1046 01/14/24 2300 01/15/24 0204 01/15/24 0430  WBC 6.4 4.0 8.7 10.5  NEUTROABS 4.7  --  6.7  --   HGB 13.9 7.4* 14.0 13.8  HCT 42.0 23.0* 42.5 41.3  MCV 94.6  97.5 93.4 93.7  PLT 169 89* 157 155   Cardiac Enzymes: No results for input(s): CKTOTAL, CKMB, CKMBINDEX, TROPONINI in the last 168 hours. BNP (last 3 results) No results for input(s): PROBNP in the last 8760 hours. CBG: No results for input(s): GLUCAP in the last 168 hours. D-Dimer: No results for input(s): DDIMER in the last 72 hours. Hgb A1c: No results for input(s): HGBA1C in the last 72 hours. Lipid Profile: No results for input(s): CHOL, HDL, LDLCALC, TRIG, CHOLHDL, LDLDIRECT in the last 72 hours. Thyroid function studies: No results for input(s): TSH, T4TOTAL, T3FREE, THYROIDAB in the last 72 hours.  Invalid input(s): FREET3 Anemia work up: No results for input(s): VITAMINB12, FOLATE, FERRITIN, TIBC, IRON , RETICCTPCT in the last 72 hours. Sepsis Labs: Recent Labs  Lab 01/10/24 1046 01/14/24 2300 01/14/24 2318 01/15/24 0204 01/15/24 0430  WBC 6.4 4.0  --  8.7 10.5  LATICACIDVEN  --   --  1.1  --   --     Microbiology Recent Results (from the past 240 hours)  Blood culture (routine single)     Status: None (Preliminary result)   Collection Time: 01/14/24 11:18 PM   Specimen: BLOOD  Result Value Ref Range Status   Specimen Description BLOOD RIGHT ARM  Final   Special Requests   Final    BOTTLES DRAWN AEROBIC AND ANAEROBIC Blood Culture adequate volume   Culture   Final    NO GROWTH < 12 HOURS Performed at [facility], [address] [place], [place] [address]    Report Status PENDING  Incomplete    Procedures and diagnostic studies:  US  Venous Img Lower Bilateral  (DVT) Result Date: 01/15/2024 CLINICAL DATA:  Leg pain.  History of B-cell lymphoma EXAM: BILATERAL LOWER EXTREMITY VENOUS DOPPLER ULTRASOUND TECHNIQUE: Gray-scale sonography with graded compression, as well as color Doppler and duplex ultrasound were performed to evaluate the lower extremity deep venous systems from the level of the common femoral vein and including the common femoral, femoral, profunda femoral, popliteal and calf veins including the posterior tibial, peroneal and gastrocnemius veins when visible. The superficial great saphenous vein was also interrogated. Spectral Doppler was utilized to evaluate flow at rest and with distal augmentation maneuvers in the common femoral, femoral and popliteal veins. COMPARISON:  Ultrasound 2015.  Right lower extremity FINDINGS: RIGHT LOWER EXTREMITY Common Femoral Vein: No evidence of thrombus. Normal compressibility, respiratory phasicity and response to augmentation. Saphenofemoral Junction: No evidence of thrombus. Normal compressibility and flow on color Doppler imaging. Profunda Femoral Vein: No evidence of thrombus. Normal compressibility and flow on color Doppler imaging. Femoral Vein: No evidence of thrombus. Normal compressibility, respiratory phasicity and response to augmentation. Popliteal Vein: No evidence of thrombus. Normal compressibility, respiratory phasicity and response to augmentation. Calf Veins: No evidence of thrombus. Normal compressibility and flow on color Doppler imaging. Superficial Great Saphenous Vein: No evidence of thrombus. Normal compressibility. Venous Reflux:  None. Other Findings: Mildly complex cystic focus in the popliteal fossa measuring 3.0 x 0.8 x 1.8 cm. LEFT LOWER EXTREMITY Common Femoral Vein: No evidence of thrombus. Normal compressibility, respiratory phasicity and response to augmentation. Saphenofemoral Junction: No evidence of thrombus. Normal compressibility and flow on color Doppler imaging. Profunda Femoral  Vein: No evidence of thrombus. Normal compressibility and flow on color Doppler imaging. Femoral Vein: No evidence of thrombus. Normal compressibility, respiratory phasicity and response to augmentation. Popliteal Vein: No evidence of thrombus. Normal compressibility, respiratory phasicity and response to augmentation. Calf Veins: No evidence  of thrombus. Normal compressibility and flow on color Doppler imaging. Superficial Great Saphenous Vein: No evidence of thrombus. Normal compressibility. Venous Reflux:  None. Other Findings:  None. IMPRESSION: No evidence of bilateral lower extremity DVT. Possible right popliteal fossa Baker's cyst. There is a differential. Please correlate for known history and dedicated confirmatory evaluation as clinically appropriate. Electronically Signed   By: Ranell Bring M.D.   On: 01/15/2024 12:04   CT ABDOMEN PELVIS W CONTRAST Result Date: 01/15/2024 CLINICAL DATA:  Abdominal pain EXAM: CT ABDOMEN AND PELVIS WITH CONTRAST TECHNIQUE: Multidetector CT imaging of the abdomen and pelvis was performed using the standard protocol following bolus administration of intravenous contrast. RADIATION DOSE REDUCTION: This exam was performed according to the departmental dose-optimization program which includes automated exposure control, adjustment of the mA and/or kV according to patient size and/or use of iterative reconstruction technique. CONTRAST:  80mL OMNIPAQUE  IOHEXOL  300 MG/ML  SOLN COMPARISON:  11/06/2022 FINDINGS: Lower Chest: Normal. Hepatobiliary: Normal hepatic contours. No intra- or extrahepatic biliary dilatation. Status post cholecystectomy. Pancreas: Normal pancreas. No ductal dilatation or peripancreatic fluid collection. Spleen: Normal. Adrenals/Urinary Tract: The adrenal glands are normal. No hydronephrosis, nephroureterolithiasis or solid renal mass. The urinary bladder is normal for degree of distention Stomach/Bowel: There is no hiatal hernia. Normal duodenal course and  caliber. No small bowel dilatation or inflammation. There is mild inflammatory change adjacent to the distal descending colon. Normal appendix. Vascular/Lymphatic: There is calcific atherosclerosis of the abdominal aorta. No lymphadenopathy. Reproductive: Status post hysterectomy. No adnexal mass. Other: None. Musculoskeletal: Grade 1 anterolisthesis at L4-5. IMPRESSION: Mild inflammatory change adjacent to the distal descending colon, which may indicate mild colitis. Electronically Signed   By: Franky Stanford M.D.   On: 01/15/2024 01:47   DG Chest Port 1 View Result Date: 01/14/2024 CLINICAL DATA:  Status post fall. EXAM: PORTABLE CHEST 1 VIEW COMPARISON:  January 10, 2024 FINDINGS: There is stable right-sided venous Port-A-Cath positioning. The heart size and mediastinal contours are within normal limits. Both lungs are clear. Multiple chronic bilateral rib fractures are seen. Multilevel degenerative changes are present throughout the thoracic spine. IMPRESSION: No active cardiopulmonary disease. Electronically Signed   By: Suzen Dials M.D.   On: 01/14/2024 23:38   DG Ankle Complete Left Result Date: 01/14/2024 CLINICAL DATA:  Status post fall. EXAM: LEFT ANKLE COMPLETE - 3+ VIEW COMPARISON:  None Available. FINDINGS: Acute nondisplaced fracture deformity is seen involving the left lateral malleolus. There is no evidence of dislocation. An additional fracture deformity of indeterminate age is seen involving the distal aspect of the fifth left metatarsal. Mild anterior and lateral soft tissue swelling is noted. IMPRESSION: Acute nondisplaced fracture of the left lateral malleolus. Electronically Signed   By: Suzen Dials M.D.   On: 01/14/2024 23:37               LOS: 0 days   Anyah Swallow  [facility]   Pager on [email or web address]. If 7PM-7AM, please contact night-coverage at [email or web address]     01/15/2024, 12:09 PM

## 2024-01-15 NOTE — ED Notes (Signed)
 Pt soiled brief. Brief changed, pt cleaned, pt tolerated well.

## 2024-01-15 NOTE — ED Notes (Signed)
 Pt weaned off from O2 at this time per MD.   O2 96% on room air.

## 2024-01-15 NOTE — Assessment & Plan Note (Signed)
-   Will follow serial troponins and EKGs.  I suspect atypical etiology likely GI related. - The patient will be placed on  p.r.n. sublingual nitroglycerin  and morphine  sulfate for pain. - We will obtain a cardiology consult in a.m. for further cardiac risk stratification. - I notified Dr. Florencio about the patient.

## 2024-01-15 NOTE — Assessment & Plan Note (Signed)
Will continue Neurontin.

## 2024-01-15 NOTE — Assessment & Plan Note (Signed)
-   Will continue her inhalers.

## 2024-01-15 NOTE — Assessment & Plan Note (Signed)
-   The patient has a history of peptic ulcer disease. - Will continue PPI therapy.

## 2024-01-16 ENCOUNTER — Inpatient Hospital Stay: Admit: 2024-01-16 | Discharge: 2024-01-16 | Disposition: A | Discharge status: Home / Self Care

## 2024-01-16 DIAGNOSIS — K529 Noninfective gastroenteritis and colitis, unspecified: Secondary | ICD-10-CM | POA: Diagnosis not present

## 2024-01-16 LAB — CBC
HCT: 23 % — ABNORMAL LOW (ref 36.0–46.0)
HCT: 37.5 % (ref 36.0–46.0)
Hemoglobin: 7.4 g/dL — ABNORMAL LOW (ref 12.0–15.0)
Hemoglobin: 12.3 g/dL (ref 12.0–15.0)
MCH: 31.4 pg (ref 26.0–34.0)
MCH: 31.1 pg (ref 26.0–34.0)
MCHC: 32.2 g/dL (ref 30.0–36.0)
MCHC: 32.8 g/dL (ref 30.0–36.0)
MCV: 97.5 fL (ref 80.0–100.0)
MCV: 94.9 fL (ref 80.0–100.0)
Platelets: 89 K/uL — ABNORMAL LOW (ref 150–400)
Platelets: 135 K/uL — ABNORMAL LOW (ref 150–400)
RBC: 2.36 MIL/uL — ABNORMAL LOW (ref 3.87–5.11)
RBC: 3.95 MIL/uL (ref 3.87–5.11)
RDW: 13.2 % (ref 11.5–15.5)
RDW: 13.2 % (ref 11.5–15.5)
WBC: 4 K/uL (ref 4.0–10.5)
WBC: 11.2 K/uL — ABNORMAL HIGH (ref 4.0–10.5)
nRBC: 0 % (ref 0.0–0.2)
nRBC: 0 % (ref 0.0–0.2)

## 2024-01-16 LAB — BLOOD CULTURE ID PANEL (REFLEXED) - BCID2

## 2024-01-16 LAB — BASIC METABOLIC PANEL WITH GFR
Anion gap: 8 (ref 5–15)
BUN: 17 mg/dL (ref 8–23)
CO2: 26 mmol/L (ref 22–32)
Calcium: 8.5 mg/dL — ABNORMAL LOW (ref 8.9–10.3)
Chloride: 102 mmol/L (ref 98–111)
Creatinine, Ser: 1.33 mg/dL — ABNORMAL HIGH (ref 0.44–1.00)
GFR, Estimated: 41 mL/min — ABNORMAL LOW (ref >60)
Glucose, Bld: 112 mg/dL — ABNORMAL HIGH (ref 70–99)
Potassium: 3.5 mmol/L (ref 3.5–5.1)
Sodium: 136 mmol/L (ref 135–145)

## 2024-01-16 MED ORDER — ALUM & MAG HYDROXIDE-SIMETH 200-200-20 MG/5ML PO SUSP
15.0000 mL | ORAL | Status: DC | PRN
  Administered 2024-01-16 – 2024-01-20 (×7): 15 mL via ORAL
  Filled 2024-01-16 (×7): qty 30

## 2024-01-16 MED ORDER — SENNOSIDES-DOCUSATE SODIUM 8.6-50 MG PO TABS
1.0000 | ORAL_TABLET | Freq: Two times a day (BID) | ORAL | Status: AC
  Administered 2024-01-16 – 2024-01-17 (×2): 1 via ORAL
  Filled 2024-01-16 (×2): qty 1

## 2024-01-16 MED ORDER — POLYETHYLENE GLYCOL 3350 17 G PO PACK
17.0000 g | PACK | Freq: Every day | ORAL | Status: AC
  Administered 2024-01-16 – 2024-01-17 (×2): 17 g via ORAL
  Filled 2024-01-16 (×2): qty 1

## 2024-01-16 MED ORDER — SODIUM CHLORIDE 0.9 % IV SOLN
2.0000 g | Freq: Two times a day (BID) | INTRAVENOUS | Status: DC
  Administered 2024-01-16 – 2024-01-18 (×4): 2 g via INTRAVENOUS
  Filled 2024-01-16 (×4): qty 12.5

## 2024-01-16 NOTE — Progress Notes (Signed)
 [facility] CARDIOLOGY PROGRESS NOTE       Patient ID: Erin [name] MRN: [id number] y.o. [id number] DOB/AGE: [birth year] 77 y.o.  Admit date: 01/14/2024 Referring Physician Dr. [facility], Carlin Blamer [facility] Primary Cardiologist Dr. Bosie (2016) Reason for Consultation chest pain  HPI: Erin [name] is a 78 y.o. female  with a past medical history of hypertension, diverticulosis, gastric ulcer, stage III large B-cell lymphoma in remission, asthma who presented to the ED on 01/14/2024 for lower abdominal pain to the umbilical area with associated nausea/vomiting.  Patient also endorses chest pressure.  Chest pain worsens with palpation.  Patient has no known cardiac history. Cardiology was consulted for further evaluation chest pressure.  Interval History: -Patient seen and examined this AM and laying comfortably in hospital bed. Patient states feels good this a.m. and denies chest pain, SOB, palpitations.  Also states abdominal pain has improved. -Patients BP and HR  this AM. Overnight Tele showed no significant events.  -Patient remains on 2L with stable SpO2.    Review of systems complete and found to be negative unless listed above    Past Medical History:  Diagnosis Date   Acute URI    Allergy    Anxiety    Asthma    well controlled   Cancer (HCC)    lymphoma in remission   Diverticulosis    DVT of upper extremity (deep vein thrombosis) (HCC) 07/2008 & 12/2007   Gastric ulcer 10/2007   EGD   GERD (gastroesophageal reflux disease)    Hepatitis B    treated   History of hiatal hernia    Hypertension    Laboratory confirmed diagnosis of COVID-19 06/27/2019   Lower extremity edema    Lymphoma (HCC)    Stage 3 large B cell lymphoma    Past Surgical History:  Procedure Laterality Date   ANTERIOR AND POSTERIOR REPAIR N/A 03/17/2020   Procedure: ANTERIOR (CYSTOCELE) AND POSTERIOR REPAIR (RECTOCELE);  Surgeon: Connell Davies, MD;  Location: [facility]  ORS;  Service: Gynecology;  Laterality: N/A;   CHOLECYSTECTOMY     COLONOSCOPY  11/23/2007, 05/12/1994   COLONOSCOPY WITH PROPOFOL  N/A 03/31/2018   Procedure: COLONOSCOPY WITH PROPOFOL ;  Surgeon: Viktoria Lamar DASEN, MD;  Location: [facility] ENDOSCOPY;  Service: Endoscopy;  Laterality: N/A;   ESOPHAGOGASTRODUODENOSCOPY  09/03/2009, 10/14/2008, 09/30/2008, 11/23/2007   HEMORRHOID SURGERY     LAPAROSCOPIC VAGINAL HYSTERECTOMY WITH SALPINGO OOPHORECTOMY Bilateral 03/17/2020   Procedure: LAPAROSCOPIC ASSISTED VAGINAL HYSTERECTOMY WITH SALPINGO OOPHORECTOMY;  Surgeon: Connell Davies, MD;  Location: [facility] ORS;  Service: Gynecology;  Laterality: Bilateral;   LIVER BIOPSY  01/27/1996   PORTOCAVAL SHUNT PLACEMENT     and removal   PUBOVAGINAL SLING N/A 03/17/2020   Procedure: PUBO-VAGINAL SLING-TVT;  Surgeon: Connell Davies, MD;  Location: [facility] ORS;  Service: Gynecology;  Laterality: N/A;    Medications Prior to Admission  Medication Sig Dispense Refill Last Dose/Taking   acetaminophen  (TYLENOL ) 325 MG tablet Take 325-650 mg by mouth every 6 (six) hours as needed for moderate pain or headache.   Taking As Needed   albuterol  (PROVENTIL  HFA;VENTOLIN  HFA) 108 (90 Base) MCG/ACT inhaler Inhale 1-2 puffs into the lungs every 6 (six) hours as needed for wheezing or shortness of breath. (Patient taking differently: Inhale 2 puffs into the lungs every 4 (four) hours as needed for wheezing or shortness of breath.) 1 Inhaler 0 Past Week   albuterol  (PROVENTIL ) (2.5 MG/3ML) 0.083% nebulizer solution Take 2.5 mg by nebulization every 6 (six) hours  as needed for wheezing or shortness of breath.   Past Week   alendronate  (FOSAMAX ) 70 MG tablet Take 70 mg by mouth every Friday.    Taking   citalopram  (CELEXA ) 40 MG tablet Take 40 mg by mouth every morning.    Taking   furosemide  (LASIX ) 20 MG tablet Take 20 mg by mouth daily.   Taking   gabapentin  (NEURONTIN ) 600 MG tablet Take 600 mg by mouth 3 (three) times daily.    Taking    hydrocortisone 2.5 % cream Apply 1 Application topically 2 (two) times daily.   Taking   hydrOXYzine  (ATARAX ) 10 MG tablet Take 10 mg by mouth at bedtime.   Taking   mometasone -formoterol  (DULERA ) 200-5 MCG/ACT AERO Inhale 2 puffs into the lungs 2 (two) times daily. 1 Inhaler 1 Taking   omeprazole (PRILOSEC) 20 MG capsule Take 20 mg by mouth 2 (two) times daily.  2 Taking   tiZANidine  (ZANAFLEX ) 2 MG tablet Take 2 mg by mouth 3 (three) times daily as needed for muscle spasms.    Taking As Needed   traZODone  (DESYREL ) 100 MG tablet Take 100 mg by mouth.   Taking   aspirin  81 MG chewable tablet Chew 81 mg by mouth daily.      Biotin  5 MG CAPS Take 5 mg by mouth daily.      Calcium  Carb-Cholecalciferol  (CALCIUM  600 + D PO) Take 1 tablet by mouth daily.      Carboxymethylcellulose Sodium (LUBRICANT EYE DROPS OP) Place 1 drop into both eyes daily as needed (dry eyes).      Cyanocobalamin  (B-12) 2500 MCG TABS Take 2,500 mcg by mouth daily.      diphenhydrAMINE  (BENADRYL ) 25 MG tablet Take 25 mg by mouth at bedtime.       docusate sodium  (COLACE) 100 MG capsule Take 1 capsule (100 mg total) by mouth 2 (two) times daily as needed. (Patient not taking: Reported on 03/10/2023) 30 capsule 2    doxycycline  (VIBRAMYCIN ) 100 MG capsule Take 1 capsule (100 mg total) by mouth 2 (two) times daily. (Patient not taking: Reported on 03/10/2023) 20 capsule 0    entecavir  (BARACLUDE ) 0.5 MG tablet Take 0.5 mg by mouth every morning.       ferrous sulfate  (FERROUSUL) 325 (65 FE) MG tablet Take 1 tablet (325 mg total) by mouth daily with breakfast. 60 tablet 1    lidocaine  (LIDODERM ) 5 % Place 1 patch onto the skin daily.      montelukast  (SINGULAIR ) 10 MG tablet Take 1 tablet (10 mg total) by mouth at bedtime. (Patient not taking: Reported on 03/10/2023) 15 tablet 0    Multiple Vitamin (MULTIVITAMIN WITH MINERALS) TABS tablet Take 1 tablet by mouth daily.      potassium chloride  SA (KLOR-CON ) 20 MEQ tablet Take 1 tablet  (20 mEq total) by mouth daily. 7 tablet 0    simethicone  (GAS-X) 80 MG chewable tablet Chew 1 tablet (80 mg total) by mouth 4 (four) times daily as needed for flatulence. (Patient not taking: Reported on 03/10/2023) 60 tablet 1    traMADol  (ULTRAM ) 50 MG tablet Take 1 tablet (50 mg total) by mouth every 6 (six) hours as needed. 12 tablet 0    warfarin (COUMADIN ) 1 MG tablet TAKE ONE TABLET BY MOUTH EVERY DAY AT 6PM (Patient not taking: Reported on 03/10/2023) 90 tablet 0    Social History   Socioeconomic History   Marital status: Divorced    Spouse name: Not on file  Number of children: Not on file   Years of education: Not on file   Highest education level: Not on file  Occupational History   Not on file  Tobacco Use   Smoking status: Never   Smokeless tobacco: Never  Vaping Use   Vaping status: Never Used  Substance and Sexual Activity   Alcohol use: No   Drug use: Never   Sexual activity: Not Currently  Other Topics Concern   Not on file  Social History Narrative   Independent at baseline, ambulates without any support   Lives by herself   Social Drivers of Corporate investment banker Strain: Not on file  Food Insecurity: No Food Insecurity (01/15/2024)   Hunger Vital Sign    Worried About Running Out of Food in the Last Year: Never true    Ran Out of Food in the Last Year: Never true  Transportation Needs: No Transportation Needs (01/15/2024)   PRAPARE - Administrator, Civil Service (Medical): No    Lack of Transportation (Non-Medical): No  Physical Activity: Not on file  Stress: Not on file  Social Connections: Moderately Isolated (01/15/2024)   Social Connection and Isolation Panel    Frequency of Communication with Friends and Family: Twice a week    Frequency of Social Gatherings with Friends and Family: Twice a week    Attends Religious Services: 1 to 4 times per year    Active Member of [employer] or Organizations: No    Attends Banker  Meetings: Never    Marital Status: Divorced  Catering manager Violence: Not At Risk (01/15/2024)   Humiliation, Afraid, Rape, and Kick questionnaire    Fear of Current or Ex-Partner: No    Emotionally Abused: No    Physically Abused: No    Sexually Abused: No    Family History  Problem Relation Age of Onset   Cancer Daughter    Asthma Mother    Breast cancer Neg Hx      Vitals:   01/15/24 2338 01/16/24 0330 01/16/24 0742 01/16/24 1130  BP: (!) 134/99 (!) 106/57 123/64 (!) 113/37  Pulse: 94 95 82 92  Resp: 17 17 17 19   Temp: 98.5 F (36.9 C) 98.2 F (36.8 C) 98.6 F (37 C) 98.8 F (37.1 C)  TempSrc:      SpO2: 100% 98% 98% 98%  Weight:      Height:        PHYSICAL EXAM General: Well-appearing elderly female, well nourished, in no acute distress. HEENT: Normocephalic and atraumatic. Neck: No JVD.   Lungs: Normal respiratory effort on 2L. Clear bilaterally to auscultation. No wheezes, crackles, rhonchi.  Heart: HRRR. Normal S1 and S2 without gallops or murmurs.  Abdomen: Non-distended appearing.  Msk: Normal strength and tone for age. Extremities: Warm and well perfused. No clubbing, cyanosis, edema.  Neuro: Alert and oriented X 3. Psych: Answers questions appropriately.   Labs: Basic Metabolic Panel: Recent Labs    01/15/24 0430 01/16/24 0600  NA 139 136  K 3.2* 3.5  CL 107 102  CO2 26 26  GLUCOSE 133* 112*  BUN 18 17  CREATININE 1.54* 1.33*  CALCIUM  8.7* 8.5*  MG 2.4  --    Liver Function Tests: Recent Labs    01/15/24 0028  AST 23  ALT 14  ALKPHOS 66  BILITOT 0.5  PROT 7.3  ALBUMIN 3.6   Recent Labs    01/15/24 0028  LIPASE 43   CBC: Recent  Labs    01/15/24 0204 01/15/24 0430 01/16/24 0600  WBC 8.7 10.5 11.2*  NEUTROABS 6.7  --   --   HGB 14.0 13.8 12.3  HCT 42.5 41.3 37.5  MCV 93.4 93.7 94.9  PLT 157 155 135*   Cardiac Enzymes: Recent Labs    01/15/24 0028 01/15/24 0430  TROPONINIHS 6 5   BNP: No results for input(s):  BNP in the last 72 hours. D-Dimer: No results for input(s): DDIMER in the last 72 hours. Hemoglobin A1C: No results for input(s): HGBA1C in the last 72 hours. Fasting Lipid Panel: No results for input(s): CHOL, HDL, LDLCALC, TRIG, CHOLHDL, LDLDIRECT in the last 72 hours. Thyroid Function Tests: No results for input(s): TSH, T4TOTAL, T3FREE, THYROIDAB in the last 72 hours.  Invalid input(s): FREET3 Anemia Panel: No results for input(s): VITAMINB12, FOLATE, FERRITIN, TIBC, IRON , RETICCTPCT in the last 72 hours.   Radiology: US  Venous Img Lower Bilateral (DVT) Result Date: 01/15/2024 CLINICAL DATA:  Leg pain.  History of B-cell lymphoma EXAM: BILATERAL LOWER EXTREMITY VENOUS DOPPLER ULTRASOUND TECHNIQUE: Gray-scale sonography with graded compression, as well as color Doppler and duplex ultrasound were performed to evaluate the lower extremity deep venous systems from the level of the common femoral vein and including the common femoral, femoral, profunda femoral, popliteal and calf veins including the posterior tibial, peroneal and gastrocnemius veins when visible. The superficial great saphenous vein was also interrogated. Spectral Doppler was utilized to evaluate flow at rest and with distal augmentation maneuvers in the common femoral, femoral and popliteal veins. COMPARISON:  Ultrasound 2015.  Right lower extremity FINDINGS: RIGHT LOWER EXTREMITY Common Femoral Vein: No evidence of thrombus. Normal compressibility, respiratory phasicity and response to augmentation. Saphenofemoral Junction: No evidence of thrombus. Normal compressibility and flow on color Doppler imaging. Profunda Femoral Vein: No evidence of thrombus. Normal compressibility and flow on color Doppler imaging. Femoral Vein: No evidence of thrombus. Normal compressibility, respiratory phasicity and response to augmentation. Popliteal Vein: No evidence of thrombus. Normal compressibility,  respiratory phasicity and response to augmentation. Calf Veins: No evidence of thrombus. Normal compressibility and flow on color Doppler imaging. Superficial Great Saphenous Vein: No evidence of thrombus. Normal compressibility. Venous Reflux:  None. Other Findings: Mildly complex cystic focus in the popliteal fossa measuring 3.0 x 0.8 x 1.8 cm. LEFT LOWER EXTREMITY Common Femoral Vein: No evidence of thrombus. Normal compressibility, respiratory phasicity and response to augmentation. Saphenofemoral Junction: No evidence of thrombus. Normal compressibility and flow on color Doppler imaging. Profunda Femoral Vein: No evidence of thrombus. Normal compressibility and flow on color Doppler imaging. Femoral Vein: No evidence of thrombus. Normal compressibility, respiratory phasicity and response to augmentation. Popliteal Vein: No evidence of thrombus. Normal compressibility, respiratory phasicity and response to augmentation. Calf Veins: No evidence of thrombus. Normal compressibility and flow on color Doppler imaging. Superficial Great Saphenous Vein: No evidence of thrombus. Normal compressibility. Venous Reflux:  None. Other Findings:  None. IMPRESSION: No evidence of bilateral lower extremity DVT. Possible right popliteal fossa Baker's cyst. There is a differential. Please correlate for known history and dedicated confirmatory evaluation as clinically appropriate. Electronically Signed   By: Ranell Bring M.D.   On: 01/15/2024 12:04   CT ABDOMEN PELVIS W CONTRAST Result Date: 01/15/2024 CLINICAL DATA:  Abdominal pain EXAM: CT ABDOMEN AND PELVIS WITH CONTRAST TECHNIQUE: Multidetector CT imaging of the abdomen and pelvis was performed using the standard protocol following bolus administration of intravenous contrast. RADIATION DOSE REDUCTION: This exam was performed according to the  departmental dose-optimization program which includes automated exposure control, adjustment of the mA and/or kV according to patient  size and/or use of iterative reconstruction technique. CONTRAST:  80mL OMNIPAQUE  IOHEXOL  300 MG/ML  SOLN COMPARISON:  11/06/2022 FINDINGS: Lower Chest: Normal. Hepatobiliary: Normal hepatic contours. No intra- or extrahepatic biliary dilatation. Status post cholecystectomy. Pancreas: Normal pancreas. No ductal dilatation or peripancreatic fluid collection. Spleen: Normal. Adrenals/Urinary Tract: The adrenal glands are normal. No hydronephrosis, nephroureterolithiasis or solid renal mass. The urinary bladder is normal for degree of distention Stomach/Bowel: There is no hiatal hernia. Normal duodenal course and caliber. No small bowel dilatation or inflammation. There is mild inflammatory change adjacent to the distal descending colon. Normal appendix. Vascular/Lymphatic: There is calcific atherosclerosis of the abdominal aorta. No lymphadenopathy. Reproductive: Status post hysterectomy. No adnexal mass. Other: None. Musculoskeletal: Grade 1 anterolisthesis at L4-5. IMPRESSION: Mild inflammatory change adjacent to the distal descending colon, which may indicate mild colitis. Electronically Signed   By: Franky Stanford M.D.   On: 01/15/2024 01:47   DG Chest Port 1 View Result Date: 01/14/2024 CLINICAL DATA:  Status post fall. EXAM: PORTABLE CHEST 1 VIEW COMPARISON:  January 10, 2024 FINDINGS: There is stable right-sided venous Port-A-Cath positioning. The heart size and mediastinal contours are within normal limits. Both lungs are clear. Multiple chronic bilateral rib fractures are seen. Multilevel degenerative changes are present throughout the thoracic spine. IMPRESSION: No active cardiopulmonary disease. Electronically Signed   By: Suzen Dials M.D.   On: 01/14/2024 23:38   DG Ankle Complete Left Result Date: 01/14/2024 CLINICAL DATA:  Status post fall. EXAM: LEFT ANKLE COMPLETE - 3+ VIEW COMPARISON:  None Available. FINDINGS: Acute nondisplaced fracture deformity is seen involving the left lateral malleolus.  There is no evidence of dislocation. An additional fracture deformity of indeterminate age is seen involving the distal aspect of the fifth left metatarsal. Mild anterior and lateral soft tissue swelling is noted. IMPRESSION: Acute nondisplaced fracture of the left lateral malleolus. Electronically Signed   By: Suzen Dials M.D.   On: 01/14/2024 23:37   DG Chest 2 View Result Date: 01/10/2024 CLINICAL DATA:  dyspnea EXAM: CHEST - 2 VIEW COMPARISON:  May 21, 2023 FINDINGS: Similarly positioned right chest port, terminating at the cavoatrial junction. Streaky left basilar atelectasis. No focal airspace consolidation, pleural effusion, or pneumothorax. No cardiomegaly. No acute fracture or destructive lesions. Multilevel thoracic osteophytosis. Bilateral glenohumeral joint osteoarthritis. IMPRESSION: No acute cardiopulmonary abnormality. Electronically Signed   By: Rogelia Myers M.D.   On: 01/10/2024 12:10    ECHO pending  TELEMETRY reviewed by me 01/16/2024: Sinus rhythm, rate 90s  EKG reviewed by me: Normal sinus rhythm low voltage nonspecific ST-T wave changes   Data reviewed by me 01/16/2024: last 24h vitals tele labs imaging I/O hospitalist progress notes.  Principal Problem:   Acute colitis Active Problems:   CKD (chronic kidney disease) stage 3, GFR 30-59 ml/min (HCC)   Bilateral occipital neuralgia   Chest pain   GERD without esophagitis   Anxiety and depression   Chronic asthma    ASSESSMENT AND PLAN:  CHEZNEY HUETHER is a 78 y.o. female  with a past medical history of hypertension, diverticulosis, gastric ulcer, stage III large B-cell lymphoma in remission, asthma who presented to the ED on 01/14/2024 for lower abdominal pain to the umbilical area with associated nausea/vomiting.  Patient also endorses chest pressure.  Chest pain worsens with palpation.  Patient has no known cardiac history. Cardiology was consulted for further evaluation chest  pressure.  # Atypical chest  pain Patient with no known cardiac history. Reported chest pain likely from acute colitis, denies chest pain today.  EKG without acute ischemic changes.  Troponins negative x 2. BP remains stable.  -Echo pending.  Further recommendations pending results.  If echo normal results normal with pEF and no RWMA will plan to sign off. -Will schedule outpatient cardiac follow-up.  # Acute Colitis CT abdomen with mild colitis.  Gram-negative rod bacteremia. -On IV Abx, Management per primary team.  This patient's plan of care was discussed and created with Dr. Ammon and he is in agreement.  Signed: Dorene Comfort, PA-C  01/16/2024, 3:17 PM [facility] Cardiology

## 2024-01-16 NOTE — Evaluation (Signed)
 Physical Therapy Evaluation Patient Details Name: Erin [name] MRN: [id number] DOB: 06-11-1946 Today's Date: 01/16/2024  History of Present Illness  78 y.o. female  with PMHx significant for anxiety, asthma, stage III large B cell lymphoma in remission, diverticulosis, gastric ulcer and essential HTN, who presented to the ED with left-sided lower abdominal pain, periumbilical pain, nausea, vomiting, constipation, chest pain 8/10, and L ankle pain after fall. Per medic, pt has bed bugs in home. Imaging reveals acute nondisplaced L lateral malleolus fracture. Per ortho, LLE NWBing in splint.   Clinical Impression  Patient alert, agreeable to PT/OT, reported 7/10 L ankle pain with mobility. Per pt at baseline she lives alone, normally independent, uses elevators as needed. Did report 1 other fall. She was able to perform bed mobility with supervision, extra time/use of bed rails. Sit <> stand with RW and modAx2, as well as assistance to maintain LLE NWB, very challenging for patient. Able to laterally scoot into recliner with modAx2, repositioned for comfort.  Overall the patient demonstrated deficits (see PT Problem List) that impede the patient's functional abilities, safety, and mobility and would benefit from skilled PT intervention.          If plan is discharge home, recommend the following: Two people to help with walking and/or transfers;Two people to help with bathing/dressing/bathroom;Direct supervision/assist for medications management;Help with stairs or ramp for entrance;Assist for transportation;Assistance with cooking/housework;Supervision due to cognitive status   Can travel by private vehicle   No    Equipment Recommendations Other (comment) (TBD)  Recommendations for Other Services       Functional Status Assessment Patient has had a recent decline in their functional status and demonstrates the ability to make significant improvements in function in a reasonable and  predictable amount of time.     Precautions / Restrictions Precautions Precautions: Fall Recall of Precautions/Restrictions: Impaired Precaution/Restrictions Comments: Per Charity fundraiser - BED BUGS Restrictions Weight Bearing Restrictions Per Provider Order: Yes LLE Weight Bearing Per Provider Order: Non weight bearing      Mobility  Bed Mobility Overal bed mobility: Needs Assistance Bed Mobility: Supine to Sit     Supine to sit: Supervision, Used rails, HOB elevated     General bed mobility comments: increased time/effort    Transfers Overall transfer level: Needs assistance Equipment used: Rolling walker (2 wheels) Transfers: Sit to/from Stand, Bed to chair/wheelchair/BSC Sit to Stand: Mod assist, +2 physical assistance          Lateral/Scoot Transfers: Mod assist, +2 physical assistance General transfer comment: VC for technique, assist to maintain NWBing to LLE    Ambulation/Gait                  Stairs            Wheelchair Mobility     Tilt Bed    Modified Rankin (Stroke Patients Only)       Balance Overall balance assessment: Needs assistance Sitting-balance support: Feet supported, Feet unsupported, Single extremity supported Sitting balance-[name] Scale: Fair     Standing balance support: Bilateral upper extremity supported, Reliant on assistive device for balance Standing balance-[name] Scale: Zero                               Pertinent Vitals/Pain Pain Assessment Pain Assessment: 0-10 Pain Score: 7  Pain Location: LLE Pain Descriptors / Indicators: Aching Pain Intervention(s): Limited activity within patient's tolerance, Monitored during session, Repositioned, Premedicated before  session    Home Living Family/patient expects to be discharged to:: Other (Comment) [facility] senior living?) Living Arrangements: Alone                 Additional Comments: Apt with elevator access, distress pull cord in  bathroom, walk in shower with chair, has rollator. reported another fall 4 months ago    Prior Function Prior Level of Function : History of Falls (last six months)             Mobility Comments: PRN rollator when taking trash out, initially denies additional falls but then later admits to a fall ~44mo ago -- questionable historian ADLs Comments: indep, seated shower, doesn't drive, uses [employer] for groceries and dr appts, reports indep with med mgt     Extremity/Trunk Assessment   Upper Extremity Assessment Upper Extremity Assessment: Generalized weakness    Lower Extremity Assessment Lower Extremity Assessment: Generalized weakness (able to lift BLE against gravity, more effortful for LLE) LLE Deficits / Details: L ankle bruised LLE: Unable to fully assess due to pain;Unable to fully assess due to immobilization LLE Coordination: decreased fine motor;decreased gross motor       Communication        Cognition Arousal: Alert Behavior During Therapy: WFL for tasks assessed/performed   PT - Cognitive impairments: No apparent impairments                         Following commands: Intact       Cueing       General Comments      Exercises     Assessment/Plan    PT Assessment Patient needs continued PT services  PT Problem List Decreased strength;Pain;Decreased range of motion;Decreased activity tolerance;Decreased balance;Decreased mobility;Decreased knowledge of precautions;Decreased knowledge of use of DME       PT Treatment Interventions DME instruction;Balance training;Gait training;Neuromuscular re-education;Functional mobility training;Patient/family education;Therapeutic activities;Therapeutic exercise    PT Goals (Current goals can be found in the Care Plan section)  Acute Rehab PT Goals Patient Stated Goal: to have less pain PT Goal Formulation: With patient Time For Goal Achievement: 01/30/24 Potential to Achieve Goals: Good     Frequency Min 3X/week     Co-evaluation PT/OT/SLP Co-Evaluation/Treatment: Yes Reason for Co-Treatment: For patient/therapist safety;To address functional/ADL transfers PT goals addressed during session: Mobility/safety with mobility;Balance;Proper use of DME OT goals addressed during session: ADL's and self-care;Proper use of Adaptive equipment and DME       AM-PAC PT 6 Clicks Mobility  Outcome Measure Help needed turning from your back to your side while in a flat bed without using bedrails?: A Little Help needed moving from lying on your back to sitting on the side of a flat bed without using bedrails?: A Little Help needed moving to and from a bed to a chair (including a wheelchair)?: A Lot Help needed standing up from a chair using your arms (e.g., wheelchair or bedside chair)?: A Lot Help needed to walk in hospital room?: Total Help needed climbing 3-5 steps with a railing? : Total 6 Click Score: 12    End of Session   Activity Tolerance: Patient limited by pain Patient left: in chair;with call bell/phone within reach;with chair alarm set Nurse Communication: Mobility status PT Visit Diagnosis: Other abnormalities of gait and mobility (R26.89);Difficulty in walking, not elsewhere classified (R26.2);Muscle weakness (generalized) (M62.81);Pain Pain - Right/Left: Left Pain - part of body: Ankle and joints of foot  Time: 9063-8997 PT Time Calculation (min) (ACUTE ONLY): 26 min   Charges:   PT Evaluation $PT Eval Low Complexity: 1 Low PT Treatments $Therapeutic Activity: 8-22 mins PT General Charges $$ ACUTE PT VISIT: 1 Visit        Doyal Shams PT, DPT 10:59 AM,01/16/24

## 2024-01-16 NOTE — Evaluation (Signed)
 Occupational Therapy Evaluation Patient Details Name: Erin [name] MRN: [id number] DOB: Jul 02, 1945 Today's Date: 01/16/2024   History of Present Illness   78 y.o. female  with PMHx significant for anxiety, asthma, stage III large B cell lymphoma in remission, diverticulosis, gastric ulcer and essential HTN, who presented to the ED with left-sided lower abdominal pain, periumbilical pain, nausea, vomiting, constipation, chest pain 8/10, and L ankle pain after fall. Per medic, pt has bed bugs in home. Imaging reveals acute nondisplaced L lateral malleolus fracture. Per ortho, LLE NWBing in splint.    Clinical Impressions Pt was seen for OT evaluation this date. Prior to hospital admission, pt was living alone in a senior living apartment with elevator access. Endorses PRN rollator use for mobility and generally indep with ADL. She does not drive and uses [facility] transport for appointments and groceries. A&Ox4 but questionable historian. Initially denies additional falls in past 52mo then later reports fall about 20mo ago. Pt presents to acute OT demonstrating impaired ADL performance and functional mobility 2/2 decreased strength, L ankle ROM, significant L ankle pain, impaired balance, activity tolerance, and acute O2 needs (See OT problem list for additional functional deficits). Pt currently requires supv for bed mobility, MOD A +2 for STS and lateral scoot attempts. Pt anxious with movements, assist for maintaining LLE NWBing during transitional movements. Pt currently requires MAX A for LB ADL tasks. Pt would benefit from skilled OT services to address noted impairments and functional limitations (see below for any additional details) in order to maximize safety and independence while minimizing falls risk and caregiver burden. Anticipate the need for follow up OT services upon acute hospital DC.    If plan is discharge home, recommend the following:   Two people to help with walking and/or  transfers;A lot of help with bathing/dressing/bathroom;Assist for transportation;Assistance with cooking/housework;Help with stairs or ramp for entrance     Functional Status Assessment   Patient has had a recent decline in their functional status and demonstrates the ability to make significant improvements in function in a reasonable and predictable amount of time.     Equipment Recommendations   Other (comment) (defer to next venue)      Precautions/Restrictions   Precautions Precautions: Fall Recall of Precautions/Restrictions: Impaired Precaution/Restrictions Comments: Per Charity fundraiser - BED BUGS Restrictions [employer] Bearing Restrictions Per Provider Order: Yes LLE Weight Bearing Per Provider Order: Non weight bearing     Mobility Bed Mobility Overal bed mobility: Needs Assistance Bed Mobility: Supine to Sit     Supine to sit: Supervision, Used rails, HOB elevated     General bed mobility comments: increased time/effort Patient Response: Anxious  Transfers Overall transfer level: Needs assistance Equipment used: Rolling walker (2 wheels) Transfers: Sit to/from Stand, Bed to chair/wheelchair/BSC Sit to Stand: Mod assist, +2 physical assistance          Lateral/Scoot Transfers: Mod assist, +2 physical assistance General transfer comment: VC for technique, assist to maintain NWBing to LLE      Balance Overall balance assessment: Needs assistance Sitting-balance support: Feet supported, Feet unsupported, Single extremity supported Sitting balance-[name] Scale: Fair     Standing balance support: Bilateral upper extremity supported, Reliant on assistive device for balance Standing balance-[name] Scale: Zero       ADL either performed or assessed with clinical judgement   ADL Overall ADL's : Needs assistance/impaired     Grooming: Sitting;Set up     Lower Body Dressing: Bed level;Maximal assistance   Toilet Transfer: Moderate assistance;+2  for physical  assistance;Squat-pivot;BSC/3in1;Requires drop arm Toilet Transfer Details (indicate cue type and reason): simulated          Pertinent Vitals/Pain Pain Assessment Pain Assessment: 0-10 Pain Score: 7  Pain Location: LLE Pain Descriptors / Indicators: Aching Pain Intervention(s): Limited activity within patient's tolerance, Monitored during session, Premedicated before session, Repositioned     Extremity/Trunk Assessment Upper Extremity Assessment Upper Extremity Assessment: Generalized weakness   Lower Extremity Assessment Lower Extremity Assessment: Generalized weakness;LLE deficits/detail LLE Deficits / Details: L ankle bruised LLE: Unable to fully assess due to pain;Unable to fully assess due to immobilization LLE Coordination: decreased fine motor;decreased gross motor          Cognition Arousal: Alert Behavior During Therapy: WFL for tasks assessed/performed       OT - Cognition Comments: A&Ox4            Exercises Other Exercises Other Exercises: PT edu in NWBing, lateral scoots   Shoulder Instructions      Home Living Family/patient expects to be discharged to:: Other (Comment) [employer] Homes senior living?) Living Arrangements: Alone       Additional Comments: Apt with elevator access, distress pull cord in bathroom, walk in shower with chair, has rollator      Prior Functioning/Environment Prior Level of Function : History of Falls (last six months)             Mobility Comments: PRN rollator when taking trash out, initially denies additional falls but then later admits to a fall ~35mo ago -- questionable historian ADLs Comments: indep, seated shower, doesn't drive, uses [employer] for groceries and dr appts, reports indep with med mgt    OT Problem List: Decreased strength;Pain;Cardiopulmonary status limiting activity;Decreased activity tolerance;Decreased safety awareness;Impaired balance (sitting and/or standing);Decreased range of  motion;Decreased knowledge of use of DME or AE;Decreased knowledge of precautions   OT Treatment/Interventions: Self-care/ADL training;Therapeutic exercise;Therapeutic activities;Energy conservation;DME and/or AE instruction;Patient/family education;Balance training      OT Goals(Current goals can be found in the care plan section)   Acute Rehab OT Goals Patient Stated Goal: have less pain OT Goal Formulation: With patient Time For Goal Achievement: 01/30/24 Potential to Achieve Goals: Good ADL Goals Pt Will Perform Lower Body Dressing: sitting/lateral leans;with min assist Pt Will Transfer to Toilet: squat pivot transfer;bedside commode;with mod assist;with min assist (LRAD) Pt Will Perform Toileting - Clothing Manipulation and hygiene: sitting/lateral leans;with min assist   OT Frequency:  Min 2X/week    Co-evaluation PT/OT/SLP Co-Evaluation/Treatment: Yes Reason for Co-Treatment: For patient/therapist safety;To address functional/ADL transfers PT goals addressed during session: Mobility/safety with mobility;Balance;Proper use of DME OT goals addressed during session: ADL's and self-care;Proper use of Adaptive equipment and DME      AM-PAC OT 6 Clicks Daily Activity     Outcome Measure Help from another person eating meals?: None Help from another person taking care of personal grooming?: A Little Help from another person toileting, which includes using toliet, bedpan, or urinal?: A Lot Help from another person bathing (including washing, rinsing, drying)?: A Lot Help from another person to put on and taking off regular upper body clothing?: A Little Help from another person to put on and taking off regular lower body clothing?: A Lot 6 Click Score: 16   End of Session Equipment Utilized During Treatment: Oxygen;Rolling walker (2 wheels) Nurse Communication: Mobility status;Other (comment) (needs full bed change)  Activity Tolerance: Patient limited by pain Patient left:  in chair;with call bell/phone within reach;with chair alarm set  OT Visit Diagnosis: Other abnormalities of gait and mobility (R26.89);Muscle weakness (generalized) (M62.81);Pain Pain - Right/Left: Left Pain - part of body: Ankle and joints of foot;Leg                Time: 9063-8998 OT Time Calculation (min): 25 min Charges:  OT General Charges $OT Visit: 1 Visit OT Evaluation $OT Eval Moderate Complexity: 1 Mod  Warren SAUNDERS., MPH, MS, OTR/L ascom [phone] 01/16/24, 10:19 AM

## 2024-01-16 NOTE — Progress Notes (Signed)
   01/16/24 1802  Vitals  Temp 98.5 F (36.9 C)  Temp Source Oral  BP 122/70  MAP (mmHg) 85  BP Location Right Arm  BP Method Automatic  Patient Position (if appropriate) Lying  Pulse Rate 94  Resp 18  Level of Consciousness  Level of Consciousness Alert  MEWS COLOR  MEWS Score Color Green  Oxygen Therapy  SpO2 99 %  O2 Device Room Air  MEWS Score  MEWS Temp 0  MEWS Systolic 0  MEWS Pulse 0  MEWS RR 0  MEWS LOC 0  MEWS Score 0   Patient admitted as transfer alert, room air, with stable vitals.  Pure wick and safety precautions in place.  Plan of care continues.

## 2024-01-16 NOTE — Progress Notes (Signed)
 PHARMACY - PHYSICIAN COMMUNICATION CRITICAL VALUE ALERT - BLOOD CULTURE IDENTIFICATION (BCID)  Erin [name] is an 78 y.o. female who presented to [facility] on 01/14/2024 with a chief complaint of abd pain w/ N/V and constipation  Assessment:  blood culture from 7/19 - only single set drawn - with GNR in aerobic bottle,  nothing detected on BCID.  CT showed mild colitis  Name of physician (or Provider) Contacted: Dr Jens  Current antibiotics: Ceftriaxone  + metrodnidazole  Changes to prescribed antibiotics recommended:  Recommendations accepted by provider - change ceftriaxone  to cefepime   Results for orders placed or performed during the hospital encounter of 01/14/24  Blood Culture ID Panel (Reflexed) (Collected: 01/14/2024 11:18 PM)  Result Value Ref Range   Enterococcus faecalis NOT DETECTED NOT DETECTED   Enterococcus Faecium NOT DETECTED NOT DETECTED   Listeria monocytogenes NOT DETECTED NOT DETECTED   Staphylococcus species NOT DETECTED NOT DETECTED   Staphylococcus aureus (BCID) NOT DETECTED NOT DETECTED   Staphylococcus epidermidis NOT DETECTED NOT DETECTED   Staphylococcus lugdunensis NOT DETECTED NOT DETECTED   Streptococcus species NOT DETECTED NOT DETECTED   Streptococcus agalactiae NOT DETECTED NOT DETECTED   Streptococcus pneumoniae NOT DETECTED NOT DETECTED   Streptococcus pyogenes NOT DETECTED NOT DETECTED   A.calcoaceticus-baumannii NOT DETECTED NOT DETECTED   Bacteroides fragilis NOT DETECTED NOT DETECTED   Enterobacterales NOT DETECTED NOT DETECTED   Enterobacter cloacae complex NOT DETECTED NOT DETECTED   Escherichia coli NOT DETECTED NOT DETECTED   Klebsiella aerogenes NOT DETECTED NOT DETECTED   Klebsiella oxytoca NOT DETECTED NOT DETECTED   Klebsiella pneumoniae NOT DETECTED NOT DETECTED   Proteus species NOT DETECTED NOT DETECTED   Salmonella species NOT DETECTED NOT DETECTED   Serratia marcescens NOT DETECTED NOT DETECTED   Haemophilus influenzae  NOT DETECTED NOT DETECTED   Neisseria meningitidis NOT DETECTED NOT DETECTED   Pseudomonas aeruginosa NOT DETECTED NOT DETECTED   Stenotrophomonas maltophilia NOT DETECTED NOT DETECTED   Candida albicans NOT DETECTED NOT DETECTED   Candida auris NOT DETECTED NOT DETECTED   Candida glabrata NOT DETECTED NOT DETECTED   Candida krusei NOT DETECTED NOT DETECTED   Candida parapsilosis NOT DETECTED NOT DETECTED   Candida tropicalis NOT DETECTED NOT DETECTED   Cryptococcus neoformans/gattii NOT DETECTED NOT DETECTED    Celestine Slovak, PharmD, BCPS, BCIDP Work Cell: [phone] 01/16/2024 11:07 AM

## 2024-01-16 NOTE — Progress Notes (Addendum)
 Progress Note    Erin [name]  FMW:[id number] DOB: [birth year]  DOA: 01/14/2024 PCP: [facility]      Brief Narrative:    Medical records reviewed and are as summarized below:  Erin [name] is a 78 y.o. female  with medical history significant for anxiety, asthma, stage III large B cell lymphoma in remission, diverticulosis, gastric ulcer and essential hypertension, who presented to the ED because of left-sided lower abdominal pain, periumbilical pain, nausea, vomiting and constipation.  She has had constipation for over a week.  She also had chest pain that she described as pressure in sensation and was graded as 8/10 in severity.  She said she fell while going to the bathroom and developed severe pain in the left ankle.  Reportedly when EMS picked her up, she was hypotensive with BP of 78/52, temperature 97.6 F, heart rate 78. In the ED, temperature was 99.3 per rectum, respiratory rate 12, heart rate 80 and O2 sat 98% on room air.   CT abdomen and pelvis with contrast: IMPRESSION: Mild inflammatory change adjacent to the distal descending colon, which may indicate mild colitis.  X-ray of left ankle: Acute nondisplaced fracture of the left lateral malleolus  Chest x-ray was unremarkable EKG showed normal sinus rhythm   Assessment/Plan:   Principal Problem:   Acute colitis Active Problems:   Chest pain   GERD without esophagitis   Anxiety and depression   CKD (chronic kidney disease) stage 3, GFR 30-59 ml/min (HCC)   Bilateral occipital neuralgia   Chronic asthma    Body mass index is 28.7 kg/m.   Acute colitis, gram-negative rod bacteremia: Blood culture growing gram-negative rods in aerobic bottle.  Change IV ceftriaxone  to IV cefepime .  Continue IV Flagyl .  Repeat blood cultures.  Discussed with Dustin, ID pharmacist.   Analgesics as needed for pain.  Start regular diet.   Chest pain: Resolved.  Troponins negative.  EKG  unremarkable.   2D echo is pending.  Follow-up with cardiologist.   S/p mechanical fall, acute nondisplaced fracture of the left lateral malleolus: Consulted Dr. Cleotilde, orthopedic surgeon on-call, on 01/15/2024.  He recommended a splint, nonweightbearing on the left foot and outpatient follow-up. PT and OT recommended discharge to SNF.  Consulted TOC to assist with disposition.   Hypokalemia: Improved.  Continue potassium repletion. CKD stage IIIb: Creatinine is stable   Comorbidities include anxiety, depression, asthma, bilateral occipital neuralgia, chronic hep B infection  Per medic report, patient has bed bugs at her house.  Placed on contact precautions.    Diet Order             Diet Heart Fluid consistency: Thin  Diet effective now                            Consultants: Orthopedic surgeon  Procedures: None    Medications:    calcium -vitamin D   1 tablet Oral Daily   citalopram   20 mg Oral BH-q7a   cyanocobalamin   2,500 mcg Oral Daily   diphenhydrAMINE   25 mg Oral QHS   enoxaparin  (LOVENOX ) injection  40 mg Subcutaneous Q24H   entecavir   0.5 mg Oral Daily   ferrous sulfate   325 mg Oral Q breakfast   gabapentin   600 mg Oral TID   multivitamin with minerals  1 tablet Oral Daily   polyethylene glycol  17 g Oral Daily   potassium chloride  SA  20 mEq Oral BID   Continuous Infusions:  ceFEPime  (MAXIPIME ) IV     metronidazole  100 mL/hr at 01/16/24 0700     Anti-infectives (From admission, onward)    Start     Dose/Rate Route Frequency Ordered Stop   01/16/24 1600  ceFEPIme  (MAXIPIME ) 2 g in sodium chloride  0.9 % 100 mL IVPB        2 g 200 mL/hr over 30 Minutes Intravenous Every 12 hours 01/16/24 1207     01/15/24 1500  entecavir  (BARACLUDE ) tablet 0.5 mg        0.5 mg Oral Daily 01/15/24 0401     01/15/24 0600  cefTRIAXone  (ROCEPHIN ) 2 g in sodium chloride  0.9 % 100 mL IVPB  Status:  Discontinued        2 g 200 mL/hr over 30 Minutes  Intravenous Every 24 hours 01/15/24 0553 01/16/24 1207   01/15/24 0600  metroNIDAZOLE  (FLAGYL ) IVPB 500 mg        500 mg 100 mL/hr over 60 Minutes Intravenous Every 12 hours 01/15/24 0553                Family Communication/Anticipated D/C date and plan/Code Status   DVT prophylaxis: enoxaparin  (LOVENOX ) injection 40 mg Start: 01/15/24 0800     Code Status: Full Code  Family Communication: None Disposition Plan: May need SNF at discharge   Status is: Inpatient Remains inpatient appropriate because: Acute colitis, left foot fracture       Subjective:   Interval events noted.  She feels better today.  No chest pain.  Abdominal pain is better.  She still has not moved her bowels.  She is willing to try regular diet.  She complains of pain in the left ankle.  Objective:    Vitals:   01/15/24 2338 01/16/24 0330 01/16/24 0742 01/16/24 1130  BP: (!) 134/99 (!) 106/57 123/64 (!) 113/37  Pulse: 94 95 82 92  Resp: 17 17 17 19   Temp: 98.5 F (36.9 C) 98.2 F (36.8 C) 98.6 F (37 C) 98.8 F (37.1 C)  TempSrc:      SpO2: 100% 98% 98% 98%  Weight:      Height:       No data found.   Intake/Output Summary (Last 24 hours) at 01/16/2024 1258 Last data filed at 01/16/2024 0700 Gross per 24 hour  Intake 670 ml  Output --  Net 670 ml   Filed Weights   01/15/24 2032  Weight: 73.5 kg    Exam:   GEN: NAD SKIN: Warm and dry EYES: No pallor or icterus ENT: MMM CV: RRR PULM: CTA B ABD: soft, ND, mild tenderness in the left lower quadrant, no rebound tenderness or guarding, +BS CNS: AAO x 3, non focal EXT: Left distal leg swelling, left ankle tenderness with bruising on left ankle.  Splint on left foot.      Data Reviewed:   I have personally reviewed following labs and imaging studies:  Labs: Labs show the following:   Basic Metabolic Panel: Recent Labs  Lab 01/10/24 1046 01/15/24 0028 01/15/24 0430 01/16/24 0600  NA 138 141 139 136  K 3.7  3.3* 3.2* 3.5  CL 101 104 107 102  CO2 28 27 26 26   GLUCOSE 96 116* 133* 112*  BUN 19 16 18 17   CREATININE 1.38* 1.44* 1.54* 1.33*  CALCIUM  9.2 9.3 8.7* 8.5*  MG  --   --  2.4  --    GFR Estimated Creatinine Clearance: 34.5 mL/min (  A) (by C-G formula based on SCr of 1.33 mg/dL (H)). Liver Function Tests: Recent Labs  Lab 01/10/24 1046 01/15/24 0028  AST 20 23  ALT 12 14  ALKPHOS 86 66  BILITOT 0.4 0.5  PROT 7.3 7.3  ALBUMIN 3.8 3.6   Recent Labs  Lab 01/15/24 0028  LIPASE 43   No results for input(s): AMMONIA in the last 168 hours. Coagulation profile Recent Labs  Lab 01/10/24 1046 01/14/24 2300  INR 0.9 1.4*    CBC: Recent Labs  Lab 01/10/24 1046 01/14/24 2300 01/15/24 0204 01/15/24 0430 01/16/24 0600  WBC 6.4 4.0 8.7 10.5 11.2*  NEUTROABS 4.7  --  6.7  --   --   HGB 13.9 7.4* 14.0 13.8 12.3  HCT 42.0 23.0* 42.5 41.3 37.5  MCV 94.6 97.5 93.4 93.7 94.9  PLT 169 89* 157 155 135*   Cardiac Enzymes: No results for input(s): CKTOTAL, CKMB, CKMBINDEX, TROPONINI in the last 168 hours. BNP (last 3 results) No results for input(s): PROBNP in the last 8760 hours. CBG: No results for input(s): GLUCAP in the last 168 hours. D-Dimer: No results for input(s): DDIMER in the last 72 hours. Hgb A1c: No results for input(s): HGBA1C in the last 72 hours. Lipid Profile: No results for input(s): CHOL, HDL, LDLCALC, TRIG, CHOLHDL, LDLDIRECT in the last 72 hours. Thyroid function studies: No results for input(s): TSH, T4TOTAL, T3FREE, THYROIDAB in the last 72 hours.  Invalid input(s): FREET3 Anemia work up: No results for input(s): VITAMINB12, FOLATE, FERRITIN, TIBC, IRON , RETICCTPCT in the last 72 hours. Sepsis Labs: Recent Labs  Lab 01/14/24 2300 01/14/24 2318 01/15/24 0204 01/15/24 0430 01/16/24 0600  WBC 4.0  --  8.7 10.5 11.2*  LATICACIDVEN  --  1.1  --   --   --     Microbiology Recent Results (from  the past 240 hours)  Blood culture (routine single)     Status: None (Preliminary result)   Collection Time: 01/14/24 11:18 PM   Specimen: BLOOD  Result Value Ref Range Status   Specimen Description BLOOD RIGHT ARM  Final   Special Requests   Final    BOTTLES DRAWN AEROBIC AND ANAEROBIC Blood Culture adequate volume   Culture  Setup Time   Final    GRAM NEGATIVE RODS AEROBIC BOTTLE ONLY Organism ID to follow CRITICAL RESULT CALLED TO, READ BACK BY AND VERIFIED WITH: LILIA EVERN RANK PHARMD 1050 01/16/24 HNM    Culture   Final    NO GROWTH 1 DAY Performed at [facility], [address] [place], [place] [address]    Report Status PENDING  Incomplete  Blood Culture ID Panel (Reflexed)     Status: None   Collection Time: 01/14/24 11:18 PM  Result Value Ref Range Status   Enterococcus faecalis NOT DETECTED NOT DETECTED Final   Enterococcus Faecium NOT DETECTED NOT DETECTED Final   Listeria monocytogenes NOT DETECTED NOT DETECTED Final   Staphylococcus species NOT DETECTED NOT DETECTED Final   Staphylococcus aureus (BCID) NOT DETECTED NOT DETECTED Final   Staphylococcus epidermidis NOT DETECTED NOT DETECTED Final   Staphylococcus lugdunensis NOT DETECTED NOT DETECTED Final   Streptococcus species NOT DETECTED NOT DETECTED Final   Streptococcus agalactiae NOT DETECTED NOT DETECTED Final   Streptococcus pneumoniae NOT DETECTED NOT DETECTED Final   Streptococcus pyogenes NOT DETECTED NOT DETECTED Final   A.calcoaceticus-baumannii NOT DETECTED NOT DETECTED Final   Bacteroides fragilis NOT DETECTED NOT DETECTED Final   Enterobacterales NOT DETECTED NOT  DETECTED Final   Enterobacter cloacae complex NOT DETECTED NOT DETECTED Final   Escherichia coli NOT DETECTED NOT DETECTED Final   Klebsiella aerogenes NOT DETECTED NOT DETECTED Final   Klebsiella oxytoca NOT DETECTED NOT DETECTED Final   Klebsiella pneumoniae NOT DETECTED NOT DETECTED Final   Proteus species NOT  DETECTED NOT DETECTED Final   Salmonella species NOT DETECTED NOT DETECTED Final   Serratia marcescens NOT DETECTED NOT DETECTED Final   Haemophilus influenzae NOT DETECTED NOT DETECTED Final   Neisseria meningitidis NOT DETECTED NOT DETECTED Final   Pseudomonas aeruginosa NOT DETECTED NOT DETECTED Final   Stenotrophomonas maltophilia NOT DETECTED NOT DETECTED Final   Candida albicans NOT DETECTED NOT DETECTED Final   Candida auris NOT DETECTED NOT DETECTED Final   Candida glabrata NOT DETECTED NOT DETECTED Final   Candida krusei NOT DETECTED NOT DETECTED Final   Candida parapsilosis NOT DETECTED NOT DETECTED Final   Candida tropicalis NOT DETECTED NOT DETECTED Final   Cryptococcus neoformans/gattii NOT DETECTED NOT DETECTED Final    Comment: Performed at [facility], [address] [place], [place] [address]    Procedures and diagnostic studies:  US  Venous Img Lower Bilateral (DVT) Result Date: 01/15/2024 CLINICAL DATA:  Leg pain.  History of B-cell lymphoma EXAM: BILATERAL LOWER EXTREMITY VENOUS DOPPLER ULTRASOUND TECHNIQUE: Gray-scale sonography with graded compression, as well as color Doppler and duplex ultrasound were performed to evaluate the lower extremity deep venous systems from the level of the common femoral vein and including the common femoral, femoral, profunda femoral, popliteal and calf veins including the posterior tibial, peroneal and gastrocnemius veins when visible. The superficial great saphenous vein was also interrogated. Spectral Doppler was utilized to evaluate flow at rest and with distal augmentation maneuvers in the common femoral, femoral and popliteal veins. COMPARISON:  Ultrasound 2015.  Right lower extremity FINDINGS: RIGHT LOWER EXTREMITY Common Femoral Vein: No evidence of thrombus. Normal compressibility, respiratory phasicity and response to augmentation. Saphenofemoral Junction: No evidence of thrombus. Normal compressibility and flow on color  Doppler imaging. Profunda Femoral Vein: No evidence of thrombus. Normal compressibility and flow on color Doppler imaging. Femoral Vein: No evidence of thrombus. Normal compressibility, respiratory phasicity and response to augmentation. Popliteal Vein: No evidence of thrombus. Normal compressibility, respiratory phasicity and response to augmentation. Calf Veins: No evidence of thrombus. Normal compressibility and flow on color Doppler imaging. Superficial Great Saphenous Vein: No evidence of thrombus. Normal compressibility. Venous Reflux:  None. Other Findings: Mildly complex cystic focus in the popliteal fossa measuring 3.0 x 0.8 x 1.8 cm. LEFT LOWER EXTREMITY Common Femoral Vein: No evidence of thrombus. Normal compressibility, respiratory phasicity and response to augmentation. Saphenofemoral Junction: No evidence of thrombus. Normal compressibility and flow on color Doppler imaging. Profunda Femoral Vein: No evidence of thrombus. Normal compressibility and flow on color Doppler imaging. Femoral Vein: No evidence of thrombus. Normal compressibility, respiratory phasicity and response to augmentation. Popliteal Vein: No evidence of thrombus. Normal compressibility, respiratory phasicity and response to augmentation. Calf Veins: No evidence of thrombus. Normal compressibility and flow on color Doppler imaging. Superficial Great Saphenous Vein: No evidence of thrombus. Normal compressibility. Venous Reflux:  None. Other Findings:  None. IMPRESSION: No evidence of bilateral lower extremity DVT. Possible right popliteal fossa Baker's cyst. There is a differential. Please correlate for known history and dedicated confirmatory evaluation as clinically appropriate. Electronically Signed   By: Ranell Bring M.D.   On: 01/15/2024 12:04   CT ABDOMEN PELVIS W CONTRAST Result Date: 01/15/2024  CLINICAL DATA:  Abdominal pain EXAM: CT ABDOMEN AND PELVIS WITH CONTRAST TECHNIQUE: Multidetector CT imaging of the abdomen and  pelvis was performed using the standard protocol following bolus administration of intravenous contrast. RADIATION DOSE REDUCTION: This exam was performed according to the departmental dose-optimization program which includes automated exposure control, adjustment of the mA and/or kV according to patient size and/or use of iterative reconstruction technique. CONTRAST:  80mL OMNIPAQUE  IOHEXOL  300 MG/ML  SOLN COMPARISON:  11/06/2022 FINDINGS: Lower Chest: Normal. Hepatobiliary: Normal hepatic contours. No intra- or extrahepatic biliary dilatation. Status post cholecystectomy. Pancreas: Normal pancreas. No ductal dilatation or peripancreatic fluid collection. Spleen: Normal. Adrenals/Urinary Tract: The adrenal glands are normal. No hydronephrosis, nephroureterolithiasis or solid renal mass. The urinary bladder is normal for degree of distention Stomach/Bowel: There is no hiatal hernia. Normal duodenal course and caliber. No small bowel dilatation or inflammation. There is mild inflammatory change adjacent to the distal descending colon. Normal appendix. Vascular/Lymphatic: There is calcific atherosclerosis of the abdominal aorta. No lymphadenopathy. Reproductive: Status post hysterectomy. No adnexal mass. Other: None. Musculoskeletal: Grade 1 anterolisthesis at L4-5. IMPRESSION: Mild inflammatory change adjacent to the distal descending colon, which may indicate mild colitis. Electronically Signed   By: Franky Stanford M.D.   On: 01/15/2024 01:47   DG Chest Port 1 View Result Date: 01/14/2024 CLINICAL DATA:  Status post fall. EXAM: PORTABLE CHEST 1 VIEW COMPARISON:  January 10, 2024 FINDINGS: There is stable right-sided venous Port-A-Cath positioning. The heart size and mediastinal contours are within normal limits. Both lungs are clear. Multiple chronic bilateral rib fractures are seen. Multilevel degenerative changes are present throughout the thoracic spine. IMPRESSION: No active cardiopulmonary disease.  Electronically Signed   By: Suzen Dials M.D.   On: 01/14/2024 23:38   DG Ankle Complete Left Result Date: 01/14/2024 CLINICAL DATA:  Status post fall. EXAM: LEFT ANKLE COMPLETE - 3+ VIEW COMPARISON:  None Available. FINDINGS: Acute nondisplaced fracture deformity is seen involving the left lateral malleolus. There is no evidence of dislocation. An additional fracture deformity of indeterminate age is seen involving the distal aspect of the fifth left metatarsal. Mild anterior and lateral soft tissue swelling is noted. IMPRESSION: Acute nondisplaced fracture of the left lateral malleolus. Electronically Signed   By: Suzen Dials M.D.   On: 01/14/2024 23:37               LOS: 1 day   Mako Pelfrey  [facility]   Pager on [email or web address]. If 7PM-7AM, please contact night-coverage at [email or web address]     01/16/2024, 12:58 PM

## 2024-01-17 DIAGNOSIS — K529 Noninfective gastroenteritis and colitis, unspecified: Secondary | ICD-10-CM | POA: Diagnosis not present

## 2024-01-17 LAB — ECHOCARDIOGRAM COMPLETE
AR max vel: 2.72 cm2
AV Area VTI: 2.7 cm2
AV Area mean vel: 2.65 cm2
AV Mean grad: 4 mmHg
AV Peak grad: 8.2 mmHg
Ao pk vel: 1.43 m/s
Area-P 1/2: 3.53 cm2
Calc EF: 62.8 %
Height: 63 in
MV VTI: 3.29 cm2
S' Lateral: 1.7 cm
Single Plane A2C EF: 64.7 %
Single Plane A4C EF: 56.6 %
Weight: 2592.61 [oz_av]

## 2024-01-17 LAB — CBC
HCT: 35.8 % — ABNORMAL LOW (ref 36.0–46.0)
Hemoglobin: 11.7 g/dL — ABNORMAL LOW (ref 12.0–15.0)
MCH: 31 pg (ref 26.0–34.0)
MCHC: 32.7 g/dL (ref 30.0–36.0)
MCV: 94.7 fL (ref 80.0–100.0)
Platelets: 127 K/uL — ABNORMAL LOW (ref 150–400)
RBC: 3.78 MIL/uL — ABNORMAL LOW (ref 3.87–5.11)
RDW: 13.2 % (ref 11.5–15.5)
WBC: 6.5 K/uL (ref 4.0–10.5)
nRBC: 0 % (ref 0.0–0.2)

## 2024-01-17 LAB — BASIC METABOLIC PANEL WITH GFR
Anion gap: 8 (ref 5–15)
BUN: 17 mg/dL (ref 8–23)
CO2: 29 mmol/L (ref 22–32)
Calcium: 8.8 mg/dL — ABNORMAL LOW (ref 8.9–10.3)
Chloride: 100 mmol/L (ref 98–111)
Creatinine, Ser: 1.33 mg/dL — ABNORMAL HIGH (ref 0.44–1.00)
GFR, Estimated: 41 mL/min — ABNORMAL LOW (ref >60)
Glucose, Bld: 93 mg/dL (ref 70–99)
Potassium: 3.7 mmol/L (ref 3.5–5.1)
Sodium: 137 mmol/L (ref 135–145)

## 2024-01-17 NOTE — Progress Notes (Signed)
 Per pt notes, pt has bed bugs at home. Contacted on call Dr. DOROTHA Peaches who ordered isolation. Upon speaking with pt she states she has had bed bugs at her home for a few days and she reported bed bugs to her residence Production designer, theatre/television/film. Pt states she has history of bed bugs as they 'come through a crack in walls between residences'. Pt states they will be coming to treat her place tomorrow, Tuesday. Pt denies any bed bugs on her person. Informed pt of precautions and assessment of her person and bed for evidence of bed bugs. No bed bugs found, however pt does have a few small red 'bites' on LE. Scalp and hair searched as well, no evidence. Pt states she kills bugs at home when she finds them. Pt unable to state what red bumps on legs are but denies bed bug bites.  Pt does have high anxiety and claims she can't breathe during conversation due to asthma. RT contacted and Neb treatment performed. Pt placed on [facility] for feeling SOB. O2 sat 99-100% on RA. Pt requesting anxiety med. Pt c/o abdominal pain and bowel incontinence during flatus. Pt placed on bed pan as she feels she must have BM. After some time pt was able to produce large solid BM consisting of multiple small hard pieces stuck together. Pt then filled bed pan with large amount solid liquid BM. Pt states her stomach feels much better after BM.

## 2024-01-17 NOTE — Progress Notes (Addendum)
 Physical Therapy Treatment Patient Details Name: Erin [name] MRN: [id number] DOB: Dec 08, 1945 Today's Date: 01/17/2024   History of Present Illness 78 y.o. female  with PMHx significant for anxiety, asthma, stage III large B cell lymphoma in remission, diverticulosis, gastric ulcer and essential HTN, who presented to the ED with left-sided lower abdominal pain, periumbilical pain, nausea, vomiting, constipation, chest pain 8/10, and L ankle pain after fall. Per medic, pt has bed bugs in home. Imaging reveals acute nondisplaced L lateral malleolus fracture. Per ortho, LLE NWBing in splint.    PT Comments  Pt was long sitting in bed upon arrival. Air stir-up in place on LLE. Reviewed NWB LLE. Pt agreeable to session but does have some cognition deficits come to light. She requested dino call daughter to update her on current situation. Both pt/pt's daughter agreeable to STR at DC. During session, pt was able to exit bed, stand 3 x,then stand pivot to recliner. Pt was able to maintain NWB in standing at EOB however unable to maintain during transfer from EOB to recliner. Will attempt slide board transfer next session. Pt is far from her baseline abilities. Sao2 > 92% on rm air during session. Pt remained on room air at conclusion of session. Pt was in recliner with call bell in reach and chair alarm in place.  Acute PT recommendations remain appropriate to maximize independence and safety with all ADLs.    If plan is discharge home, recommend the following: A lot of help with walking and/or transfers;A lot of help with bathing/dressing/bathroom;Assistance with feeding;Direct supervision/assist for medications management;Direct supervision/assist for financial management;Assist for transportation;Help with stairs or ramp for entrance;Supervision due to cognitive status     Equipment Recommendations  Other (comment) (Defer to next level of care)       Precautions / Restrictions  Precautions Precautions: Fall Recall of Precautions/Restrictions: Impaired Precaution/Restrictions Comments: Per Charity fundraiser - BED BUGS Restrictions Weight Bearing Restrictions Per Provider Order: Yes LLE Weight Bearing Per Provider Order: Non weight bearing     Mobility  Bed Mobility Overal bed mobility: Needs Assistance Bed Mobility: Supine to Sit  Supine to sit: Supervision, Used rails  General bed mobility comments: no physical assistance to achieve EOB sitting    Transfers Overall transfer level: Needs assistance Equipment used: Rolling walker (2 wheels) Transfers: Sit to/from Stand, Bed to chair/wheelchair/BSC Sit to Stand: Mod assist, From elevated surface Stand pivot transfers: Mod assist, From elevated surface  General transfer comment: recommend attempt of slideboard next session versus stand pivot due to pt's NWB restrictions. Pt did well keeping NWB in standing however struggles to pivot and required physical assistance to prevent wt bearing when transferring to recliner from EOB.    Ambulation/Gait  General Gait Details: unable due to wt bearing restrictions and pt's inability to maintain NWB in standing   Balance Overall balance assessment: Needs assistance Sitting-balance support: Bilateral upper extremity supported (LLE support) Sitting balance-[name] Scale: Fair     Standing balance support: Bilateral upper extremity supported, During functional activity, Reliant on assistive device for balance Standing balance-[name] Scale: Poor       Communication Communication Communication: No apparent difficulties  Cognition Arousal: Alert Behavior During Therapy: WFL for tasks assessed/performed   PT - Cognitive impairments: No apparent impairments    PT - Cognition Comments: Pt is A and O x 3. does seem to lack total insight of deficits and POC going forward. constant vcs for proper wt bearing Following commands: Intact      Cueing  Cueing Techniques: Verbal cues      General Comments General comments (skin integrity, edema, etc.): reviewed rehab recommendations and post acute care needs. Pt agreeable to rehab. author discussed with pt's daughter via phone. Answered her questions and undated her on current PT recommendations. Pt and daughter state understanding and agreeable to STR at DC.      Pertinent Vitals/Pain Pain Assessment Pain Assessment: 0-10 Pain Score: 3  Pain Location: LLE Pain Descriptors / Indicators: Aching Pain Intervention(s): Limited activity within patient's tolerance, Monitored during session, Premedicated before session, Repositioned, Ice applied     PT Goals (current goals can now be found in the care plan section) Acute Rehab PT Goals Patient Stated Goal: get better to return home Progress towards PT goals: Progressing toward goals    Frequency    Min 3X/week       Co-evaluation     PT goals addressed during session: Mobility/safety with mobility;Balance;Proper use of DME;Strengthening/ROM        AM-PAC PT 6 Clicks Mobility   Outcome Measure  Help needed turning from your back to your side while in a flat bed without using bedrails?: A Little Help needed moving from lying on your back to sitting on the side of a flat bed without using bedrails?: A Little Help needed moving to and from a bed to a chair (including a wheelchair)?: A Lot Help needed standing up from a chair using your arms (e.g., wheelchair or bedside chair)?: A Lot Help needed to walk in hospital room?: Total Help needed climbing 3-5 steps with a railing? : Total 6 Click Score: 12    End of Session   Activity Tolerance: Patient tolerated treatment well;Other (comment) (limited by NWB restrictions) Patient left: in chair;with call bell/phone within reach;with chair alarm set Nurse Communication: Mobility status PT Visit Diagnosis: Other abnormalities of gait and mobility (R26.89);Difficulty in walking, not elsewhere classified (R26.2);Muscle  weakness (generalized) (M62.81);Pain Pain - Right/Left: Left Pain - part of body: Ankle and joints of foot     Time: 1003-1033 PT Time Calculation (min) (ACUTE ONLY): 30 min  Charges:    $Therapeutic Activity: 23-37 mins PT General Charges $$ ACUTE PT VISIT: 1 Visit                    Rankin Essex PTA 01/17/24, 12:54 PM

## 2024-01-17 NOTE — Plan of Care (Signed)
  Problem: Education: Goal: Knowledge of General Education information will improve Description: Including pain rating scale, medication(s)/side effects and non-pharmacologic comfort measures Outcome: Progressing   Problem: Health Behavior/Discharge Planning: Goal: Ability to manage health-related needs will improve Outcome: Progressing   Problem: Clinical Measurements: Goal: Respiratory complications will improve Outcome: Progressing   Problem: Clinical Measurements: Goal: Cardiovascular complication will be avoided Outcome: Progressing   Problem: Activity: Goal: Risk for activity intolerance will decrease Outcome: Progressing   Problem: Nutrition: Goal: Adequate nutrition will be maintained Outcome: Progressing   Problem: Coping: Goal: Level of anxiety will decrease Outcome: Progressing   Problem: Elimination: Goal: Will not experience complications related to bowel motility Outcome: Progressing Goal: Will not experience complications related to urinary retention Outcome: Progressing   Problem: Safety: Goal: Ability to remain free from injury will improve Outcome: Progressing   Problem: Pain Managment: Goal: General experience of comfort will improve and/or be controlled Outcome: Progressing   Problem: Skin Integrity: Goal: Risk for impaired skin integrity will decrease Outcome: Progressing

## 2024-01-17 NOTE — Progress Notes (Signed)
 [facility] CLINIC CARDIOLOGY PROGRESS NOTE       Patient ID: Erin [name] MRN: [id number] DOB/AGE: 1946-03-24 78 y.o.  Admit date: 01/14/2024 Referring Physician Dr. Nor[facility], [facility] Primary Cardiologist Dr. Bosie (2016) Reason for Consultation chest pain  HPI: Erin [name] is a 78 y.o. female  with a past medical history of hypertension, diverticulosis, gastric ulcer, stage III large B-cell lymphoma in remission, asthma who presented to the ED on 01/14/2024 for lower abdominal pain to the umbilical area with associated nausea/vomiting.  Patient also endorses chest pressure.  Chest pain worsens with palpation.  Patient has no known cardiac history. Cardiology was consulted for further evaluation chest pressure.  Interval History: -Patient seen and examined this AM and sitting up in bedside chair. Patient states feels okay this AM and denies chest pain, SOB, palpitations.  Also states abdominal pain has improved. -Patients BP borderline and HR stable this AM.  -Patient remains on 2L with stable SpO2.    Review of systems complete and found to be negative unless listed above    Past Medical History:  Diagnosis Date   Acute URI    Allergy    Anxiety    Asthma    well controlled   Cancer (HCC)    lymphoma in remission   Diverticulosis    DVT of upper extremity (deep vein thrombosis) (HCC) 07/2008 & 12/2007   Gastric ulcer 10/2007   EGD   GERD (gastroesophageal reflux disease)    Hepatitis B    treated   History of hiatal hernia    Hypertension    Laboratory confirmed diagnosis of COVID-19 06/27/2019   Lower extremity edema    Lymphoma (HCC)    Stage 3 large B cell lymphoma    Past Surgical History:  Procedure Laterality Date   ANTERIOR AND POSTERIOR REPAIR N/A 03/17/2020   Procedure: ANTERIOR (CYSTOCELE) AND POSTERIOR REPAIR (RECTOCELE);  Surgeon: Connell Davies, MD;  Location: [facility] ORS;  Service: Gynecology;   Laterality: N/A;   CHOLECYSTECTOMY     COLONOSCOPY  11/23/2007, 05/12/1994   COLONOSCOPY WITH PROPOFOL  N/A 03/31/2018   Procedure: COLONOSCOPY WITH PROPOFOL ;  Surgeon: Viktoria Lamar DASEN, MD;  Location: [facility] ENDOSCOPY;  Service: Endoscopy;  Laterality: N/A;   ESOPHAGOGASTRODUODENOSCOPY  09/03/2009, 10/14/2008, 09/30/2008, 11/23/2007   HEMORRHOID SURGERY     LAPAROSCOPIC VAGINAL HYSTERECTOMY WITH SALPINGO OOPHORECTOMY Bilateral 03/17/2020   Procedure: LAPAROSCOPIC ASSISTED VAGINAL HYSTERECTOMY WITH SALPINGO OOPHORECTOMY;  Surgeon: Connell Davies, MD;  Location: [facility] ORS;  Service: Gynecology;  Laterality: Bilateral;   LIVER BIOPSY  01/27/1996   PORTOCAVAL SHUNT PLACEMENT     and removal   PUBOVAGINAL SLING N/A 03/17/2020   Procedure: PUBO-VAGINAL SLING-TVT;  Surgeon: Connell Davies, MD;  Location: [facility] ORS;  Service: Gynecology;  Laterality: N/A;    Medications Prior to Admission  Medication Sig Dispense Refill Last Dose/Taking   acetaminophen  (TYLENOL ) 325 MG tablet Take 325-650 mg by mouth every 6 (six) hours as needed for moderate pain or headache.   Taking As Needed   albuterol  (PROVENTIL  HFA;VENTOLIN  HFA) 108 (90 Base) MCG/ACT inhaler Inhale 1-2 puffs into the lungs every 6 (six) hours as needed for wheezing or shortness of breath. (Patient taking differently: Inhale 2 puffs into the lungs every 4 (four) hours as needed for wheezing or shortness of breath.) 1 Inhaler 0 Past Week   albuterol  (PROVENTIL ) (2.5 MG/3ML) 0.083% nebulizer solution Take 2.5 mg by nebulization every 6 (six) hours as needed for wheezing or  shortness of breath.   Past Week   alendronate  (FOSAMAX ) 70 MG tablet Take 70 mg by mouth every Friday.    Taking   citalopram  (CELEXA ) 40 MG tablet Take 40 mg by mouth every morning.    Taking   furosemide  (LASIX ) 20 MG tablet Take 20 mg by mouth daily.   Taking   gabapentin  (NEURONTIN ) 600 MG tablet Take 600 mg by mouth 3 (three) times daily.    Taking   hydrocortisone 2.5 % cream Apply 1  Application topically 2 (two) times daily.   Taking   hydrOXYzine  (ATARAX ) 10 MG tablet Take 10 mg by mouth at bedtime.   Taking   mometasone -formoterol  (DULERA ) 200-5 MCG/ACT AERO Inhale 2 puffs into the lungs 2 (two) times daily. 1 Inhaler 1 Taking   omeprazole (PRILOSEC) 20 MG capsule Take 20 mg by mouth 2 (two) times daily.  2 Taking   tiZANidine  (ZANAFLEX ) 2 MG tablet Take 2 mg by mouth 3 (three) times daily as needed for muscle spasms.    Taking As Needed   traZODone  (DESYREL ) 100 MG tablet Take 100 mg by mouth.   Taking   aspirin  81 MG chewable tablet Chew 81 mg by mouth daily.      Biotin  5 MG CAPS Take 5 mg by mouth daily.      Calcium  Carb-Cholecalciferol  (CALCIUM  600 + D PO) Take 1 tablet by mouth daily.      Carboxymethylcellulose Sodium (LUBRICANT EYE DROPS OP) Place 1 drop into both eyes daily as needed (dry eyes).      Cyanocobalamin  (B-12) 2500 MCG TABS Take 2,500 mcg by mouth daily.      diphenhydrAMINE  (BENADRYL ) 25 MG tablet Take 25 mg by mouth at bedtime.       docusate sodium  (COLACE) 100 MG capsule Take 1 capsule (100 mg total) by mouth 2 (two) times daily as needed. (Patient not taking: Reported on 03/10/2023) 30 capsule 2    doxycycline  (VIBRAMYCIN ) 100 MG capsule Take 1 capsule (100 mg total) by mouth 2 (two) times daily. (Patient not taking: Reported on 03/10/2023) 20 capsule 0    entecavir  (BARACLUDE ) 0.5 MG tablet Take 0.5 mg by mouth every morning.       ferrous sulfate  (FERROUSUL) 325 (65 FE) MG tablet Take 1 tablet (325 mg total) by mouth daily with breakfast. 60 tablet 1    lidocaine  (LIDODERM ) 5 % Place 1 patch onto the skin daily.      montelukast  (SINGULAIR ) 10 MG tablet Take 1 tablet (10 mg total) by mouth at bedtime. (Patient not taking: Reported on 03/10/2023) 15 tablet 0    Multiple Vitamin (MULTIVITAMIN WITH MINERALS) TABS tablet Take 1 tablet by mouth daily.      potassium chloride  SA (KLOR-CON ) 20 MEQ tablet Take 1 tablet (20 mEq total) by mouth daily. 7  tablet 0    simethicone  (GAS-X) 80 MG chewable tablet Chew 1 tablet (80 mg total) by mouth 4 (four) times daily as needed for flatulence. (Patient not taking: Reported on 03/10/2023) 60 tablet 1    traMADol  (ULTRAM ) 50 MG tablet Take 1 tablet (50 mg total) by mouth every 6 (six) hours as needed. 12 tablet 0    warfarin (COUMADIN ) 1 MG tablet TAKE ONE TABLET BY MOUTH EVERY DAY AT 6PM (Patient not taking: Reported on 03/10/2023) 90 tablet 0    Social History   Socioeconomic History   Marital status: Divorced    Spouse name: Not on file   Number of children:  Not on file   Years of education: Not on file   Highest education level: Not on file  Occupational History   Not on file  Tobacco Use   Smoking status: Never   Smokeless tobacco: Never  Vaping Use   Vaping status: Never Used  Substance and Sexual Activity   Alcohol use: No   Drug use: Never   Sexual activity: Not Currently  Other Topics Concern   Not on file  Social History Narrative   Independent at baseline, ambulates without any support   Lives by herself   Social Drivers of Corporate investment banker Strain: Not on file  Food Insecurity: No Food Insecurity (01/15/2024)   Hunger Vital Sign    Worried About Running Out of Food in the Last Year: Never true    Ran Out of Food in the Last Year: Never true  Transportation Needs: No Transportation Needs (01/15/2024)   PRAPARE - Administrator, Civil Service (Medical): No    Lack of Transportation (Non-Medical): No  Physical Activity: Not on file  Stress: Not on file  Social Connections: Moderately Isolated (01/15/2024)   Social Connection and Isolation Panel    Frequency of Communication with Friends and Family: Twice a week    Frequency of Social Gatherings with Friends and Family: Twice a week    Attends Religious Services: 1 to 4 times per year    Active Member of [employer] or Organizations: No    Attends Banker Meetings: Never    Marital Status:  Divorced  Catering manager Violence: Not At Risk (01/15/2024)   Humiliation, Afraid, Rape, and Kick questionnaire    Fear of Current or Ex-Partner: No    Emotionally Abused: No    Physically Abused: No    Sexually Abused: No    Family History  Problem Relation Age of Onset   Cancer Daughter    Asthma Mother    Breast cancer Neg Hx      Vitals:   01/16/24 2023 01/16/24 2142 01/17/24 0447 01/17/24 0812  BP: 111/68 102/77 100/62 91/61  Pulse: 92 90 72 82  Resp: 18 20 18 17   Temp: 99.3 F (37.4 C) 98.2 F (36.8 C) 98.2 F (36.8 C) 97.9 F (36.6 C)  TempSrc:  Oral Oral   SpO2: 98% 99% 100% 95%  Weight:      Height:        PHYSICAL EXAM General: Well-appearing elderly female, well nourished, in no acute distress. HEENT: Normocephalic and atraumatic. Neck: No JVD.   Lungs: Normal respiratory effort on 2L. Clear bilaterally to auscultation. No wheezes, crackles, rhonchi.  Heart: HRRR. Normal S1 and S2 without gallops or murmurs.  Abdomen: Non-distended appearing.  Msk: Normal strength and tone for age. Extremities: Warm and well perfused. No clubbing, cyanosis, edema.  Neuro: Alert and oriented X 3. Psych: Answers questions appropriately.   Labs: Basic Metabolic Panel: Recent Labs    01/15/24 0430 01/16/24 0600 01/17/24 0552  NA 139 136 137  K 3.2* 3.5 3.7  CL 107 102 100  CO2 26 26 29   GLUCOSE 133* 112* 93  BUN 18 17 17   CREATININE 1.54* 1.33* 1.33*  CALCIUM  8.7* 8.5* 8.8*  MG 2.4  --   --    Liver Function Tests: Recent Labs    01/15/24 0028  AST 23  ALT 14  ALKPHOS 66  BILITOT 0.5  PROT 7.3  ALBUMIN 3.6   Recent Labs    01/15/24 0028  LIPASE 43   CBC: Recent Labs    01/15/24 0204 01/15/24 0430 01/16/24 0600 01/17/24 0552  WBC 8.7   < > 11.2* 6.5  NEUTROABS 6.7  --   --   --   HGB 14.0   < > 12.3 11.7*  HCT 42.5   < > 37.5 35.8*  MCV 93.4   < > 94.9 94.7  PLT 157   < > 135* 127*   < > = values in this interval not displayed.    Cardiac Enzymes: Recent Labs    01/15/24 0028 01/15/24 0430  TROPONINIHS 6 5   BNP: No results for input(s): BNP in the last 72 hours. D-Dimer: No results for input(s): DDIMER in the last 72 hours. Hemoglobin A1C: No results for input(s): HGBA1C in the last 72 hours. Fasting Lipid Panel: No results for input(s): CHOL, HDL, LDLCALC, TRIG, CHOLHDL, LDLDIRECT in the last 72 hours. Thyroid Function Tests: No results for input(s): TSH, T4TOTAL, T3FREE, THYROIDAB in the last 72 hours.  Invalid input(s): FREET3 Anemia Panel: No results for input(s): VITAMINB12, FOLATE, FERRITIN, TIBC, IRON , RETICCTPCT in the last 72 hours.   Radiology: ECHOCARDIOGRAM COMPLETE Result Date: 01/17/2024    ECHOCARDIOGRAM REPORT   Patient Name:   Erin [name] Date of Exam: 01/16/2024 Medical Rec #:  [id number]        Height:       63.0 in Accession #:    [id number]       Weight:       162.0 lb Date of Birth:  [birth year]       BSA:          1.768 m Patient Age:    77 years         BP:           106/57 mmHg Patient Gender: F                HR:           87 bpm. Exam Location:  [facility] Procedure: 2D Echo, Cardiac Doppler and Color Doppler (Both Spectral and Color            Flow Doppler were utilized during procedure). Indications:     Chest Pain R07.9  History:         Patient has no prior history of Echocardiogram examinations.                  Signs/Symptoms:Syncope.  Sonographer:     Ashley McNeely-Sloane Referring Phys:  [id number] Erin [name] Diagnosing Phys: Marsa Dooms MD IMPRESSIONS  1. Left ventricular ejection fraction, by estimation, is 60 to 65%. The left ventricle has normal function. The left ventricle has no regional wall motion abnormalities. Left ventricular diastolic parameters are consistent with Grade I diastolic dysfunction (impaired relaxation).  2. Right ventricular systolic function is normal. The right ventricular size is normal.  3. The mitral  valve is normal in structure. Trivial mitral valve regurgitation. No evidence of mitral stenosis.  4. The aortic valve is normal in structure. Aortic valve regurgitation is not visualized. No aortic stenosis is present.  5. The inferior vena cava is normal in size with greater than 50% respiratory variability, suggesting right atrial pressure of 3 mmHg. FINDINGS  Left Ventricle: Left ventricular ejection fraction, by estimation, is 60 to 65%. The left ventricle has normal function. The left ventricle has no regional wall motion abnormalities. Strain was performed and the global longitudinal strain is indeterminate. The left ventricular  internal cavity size was normal in size. There is no left ventricular hypertrophy. Left ventricular diastolic parameters are consistent with Grade I diastolic dysfunction (impaired relaxation). Right Ventricle: The right ventricular size is normal. No increase in right ventricular wall thickness. Right ventricular systolic function is normal. Left Atrium: Left atrial size was normal in size. Right Atrium: Right atrial size was normal in size. Pericardium: There is no evidence of pericardial effusion. Mitral Valve: The mitral valve is normal in structure. Trivial mitral valve regurgitation. No evidence of mitral valve stenosis. MV peak gradient, 3.3 mmHg. The mean mitral valve gradient is 2.0 mmHg. Tricuspid Valve: The tricuspid valve is normal in structure. Tricuspid valve regurgitation is trivial. No evidence of tricuspid stenosis. Aortic Valve: The aortic valve is normal in structure. Aortic valve regurgitation is not visualized. No aortic stenosis is present. Aortic valve mean gradient measures 4.0 mmHg. Aortic valve peak gradient measures 8.2 mmHg. Aortic valve area, by VTI measures 2.70 cm. Pulmonic Valve: The pulmonic valve was normal in structure. Pulmonic valve regurgitation is not visualized. No evidence of pulmonic stenosis. Aorta: The aortic root is normal in size and  structure. Venous: The inferior vena cava is normal in size with greater than 50% respiratory variability, suggesting right atrial pressure of 3 mmHg. IAS/Shunts: No atrial level shunt detected by color flow Doppler. Additional Comments: 3D was performed not requiring image post processing on an independent workstation and was indeterminate.  LEFT VENTRICLE PLAX 2D LVIDd:         3.60 cm     Diastology LVIDs:         1.70 cm     LV e' medial:    7.72 cm/s LV PW:         0.90 cm     LV E/e' medial:  9.1 LV IVS:        0.70 cm     LV e' lateral:   10.30 cm/s LVOT diam:     2.00 cm     LV E/e' lateral: 6.8 LV SV:         69 LV SV Index:   39 LVOT Area:     3.14 cm  LV Volumes (MOD) LV vol d, MOD A2C: 56.4 ml LV vol d, MOD A4C: 50.9 ml LV vol s, MOD A2C: 19.9 ml LV vol s, MOD A4C: 22.1 ml LV SV MOD A2C:     36.5 ml LV SV MOD A4C:     50.9 ml LV SV MOD BP:      34.4 ml RIGHT VENTRICLE             IVC RV S prime:     11.50 cm/s  IVC diam: 1.10 cm TAPSE (M-mode): 1.5 cm LEFT ATRIUM             Index        RIGHT ATRIUM           Index LA diam:        3.10 cm 1.75 cm/m   RA Area:     12.30 cm LA Vol (A2C):   41.0 ml 23.19 ml/m  RA Volume:   29.70 ml  16.80 ml/m LA Vol (A4C):   22.6 ml 12.78 ml/m LA Biplane Vol: 30.7 ml 17.36 ml/m  AORTIC VALVE                    PULMONIC VALVE AV Area (Vmax):    2.72 cm     PV  Vmax:        0.99 m/s AV Area (Vmean):   2.65 cm     PV Vmean:       66.000 cm/s AV Area (VTI):     2.70 cm     PV VTI:         0.175 m AV Vmax:           143.00 cm/s  PV Peak grad:   3.9 mmHg AV Vmean:          97.700 cm/s  PV Mean grad:   2.0 mmHg AV VTI:            0.257 m      RVOT Peak grad: 1 mmHg AV Peak Grad:      8.2 mmHg AV Mean Grad:      4.0 mmHg LVOT Vmax:         124.00 cm/s LVOT Vmean:        82.300 cm/s LVOT VTI:          0.221 m LVOT/AV VTI ratio: 0.86  AORTA Ao Root diam: 2.80 cm Ao Asc diam:  3.00 cm MITRAL VALVE MV Area (PHT): 3.53 cm    SHUNTS MV Area VTI:   3.29 cm    Systemic VTI:   0.22 m MV Peak grad:  3.3 mmHg    Systemic Diam: 2.00 cm MV Mean grad:  2.0 mmHg    Pulmonic VTI:  0.124 m MV Vmax:       0.91 m/s MV Vmean:      63.3 cm/s MV Decel Time: 215 msec MV E velocity: 70.20 cm/s MV A velocity: 98.50 cm/s MV E/A ratio:  0.71 Marsa Dooms MD Electronically signed by Marsa Dooms MD Signature Date/Time: 01/17/2024/1:23:04 PM    Final    US  Venous Img Lower Bilateral (DVT) Result Date: 01/15/2024 CLINICAL DATA:  Leg pain.  History of B-cell lymphoma EXAM: BILATERAL LOWER EXTREMITY VENOUS DOPPLER ULTRASOUND TECHNIQUE: Gray-scale sonography with graded compression, as well as color Doppler and duplex ultrasound were performed to evaluate the lower extremity deep venous systems from the level of the common femoral vein and including the common femoral, femoral, profunda femoral, popliteal and calf veins including the posterior tibial, peroneal and gastrocnemius veins when visible. The superficial great saphenous vein was also interrogated. Spectral Doppler was utilized to evaluate flow at rest and with distal augmentation maneuvers in the common femoral, femoral and popliteal veins. COMPARISON:  Ultrasound 2015.  Right lower extremity FINDINGS: RIGHT LOWER EXTREMITY Common Femoral Vein: No evidence of thrombus. Normal compressibility, respiratory phasicity and response to augmentation. Saphenofemoral Junction: No evidence of thrombus. Normal compressibility and flow on color Doppler imaging. Profunda Femoral Vein: No evidence of thrombus. Normal compressibility and flow on color Doppler imaging. Femoral Vein: No evidence of thrombus. Normal compressibility, respiratory phasicity and response to augmentation. Popliteal Vein: No evidence of thrombus. Normal compressibility, respiratory phasicity and response to augmentation. Calf Veins: No evidence of thrombus. Normal compressibility and flow on color Doppler imaging. Superficial Great Saphenous Vein: No evidence of thrombus.  Normal compressibility. Venous Reflux:  None. Other Findings: Mildly complex cystic focus in the popliteal fossa measuring 3.0 x 0.8 x 1.8 cm. LEFT LOWER EXTREMITY Common Femoral Vein: No evidence of thrombus. Normal compressibility, respiratory phasicity and response to augmentation. Saphenofemoral Junction: No evidence of thrombus. Normal compressibility and flow on color Doppler imaging. Profunda Femoral Vein: No evidence of thrombus. Normal compressibility and flow on color Doppler imaging. Femoral Vein: No  evidence of thrombus. Normal compressibility, respiratory phasicity and response to augmentation. Popliteal Vein: No evidence of thrombus. Normal compressibility, respiratory phasicity and response to augmentation. Calf Veins: No evidence of thrombus. Normal compressibility and flow on color Doppler imaging. Superficial Great Saphenous Vein: No evidence of thrombus. Normal compressibility. Venous Reflux:  None. Other Findings:  None. IMPRESSION: No evidence of bilateral lower extremity DVT. Possible right popliteal fossa Baker's cyst. There is a differential. Please correlate for known history and dedicated confirmatory evaluation as clinically appropriate. Electronically Signed   By: Ranell Bring M.D.   On: 01/15/2024 12:04   CT ABDOMEN PELVIS W CONTRAST Result Date: 01/15/2024 CLINICAL DATA:  Abdominal pain EXAM: CT ABDOMEN AND PELVIS WITH CONTRAST TECHNIQUE: Multidetector CT imaging of the abdomen and pelvis was performed using the standard protocol following bolus administration of intravenous contrast. RADIATION DOSE REDUCTION: This exam was performed according to the departmental dose-optimization program which includes automated exposure control, adjustment of the mA and/or kV according to patient size and/or use of iterative reconstruction technique. CONTRAST:  80mL OMNIPAQUE  IOHEXOL  300 MG/ML  SOLN COMPARISON:  11/06/2022 FINDINGS: Lower Chest: Normal. Hepatobiliary: Normal hepatic contours. No  intra- or extrahepatic biliary dilatation. Status post cholecystectomy. Pancreas: Normal pancreas. No ductal dilatation or peripancreatic fluid collection. Spleen: Normal. Adrenals/Urinary Tract: The adrenal glands are normal. No hydronephrosis, nephroureterolithiasis or solid renal mass. The urinary bladder is normal for degree of distention Stomach/Bowel: There is no hiatal hernia. Normal duodenal course and caliber. No small bowel dilatation or inflammation. There is mild inflammatory change adjacent to the distal descending colon. Normal appendix. Vascular/Lymphatic: There is calcific atherosclerosis of the abdominal aorta. No lymphadenopathy. Reproductive: Status post hysterectomy. No adnexal mass. Other: None. Musculoskeletal: Grade 1 anterolisthesis at L4-5. IMPRESSION: Mild inflammatory change adjacent to the distal descending colon, which may indicate mild colitis. Electronically Signed   By: Franky Stanford M.D.   On: 01/15/2024 01:47   DG Chest Port 1 View Result Date: 01/14/2024 CLINICAL DATA:  Status post fall. EXAM: PORTABLE CHEST 1 VIEW COMPARISON:  January 10, 2024 FINDINGS: There is stable right-sided venous Port-A-Cath positioning. The heart size and mediastinal contours are within normal limits. Both lungs are clear. Multiple chronic bilateral rib fractures are seen. Multilevel degenerative changes are present throughout the thoracic spine. IMPRESSION: No active cardiopulmonary disease. Electronically Signed   By: Suzen Dials M.D.   On: 01/14/2024 23:38   DG Ankle Complete Left Result Date: 01/14/2024 CLINICAL DATA:  Status post fall. EXAM: LEFT ANKLE COMPLETE - 3+ VIEW COMPARISON:  None Available. FINDINGS: Acute nondisplaced fracture deformity is seen involving the left lateral malleolus. There is no evidence of dislocation. An additional fracture deformity of indeterminate age is seen involving the distal aspect of the fifth left metatarsal. Mild anterior and lateral soft tissue swelling  is noted. IMPRESSION: Acute nondisplaced fracture of the left lateral malleolus. Electronically Signed   By: Suzen Dials M.D.   On: 01/14/2024 23:37   DG Chest 2 View Result Date: 01/10/2024 CLINICAL DATA:  dyspnea EXAM: CHEST - 2 VIEW COMPARISON:  May 21, 2023 FINDINGS: Similarly positioned right chest port, terminating at the cavoatrial junction. Streaky left basilar atelectasis. No focal airspace consolidation, pleural effusion, or pneumothorax. No cardiomegaly. No acute fracture or destructive lesions. Multilevel thoracic osteophytosis. Bilateral glenohumeral joint osteoarthritis. IMPRESSION: No acute cardiopulmonary abnormality. Electronically Signed   By: Rogelia Myers M.D.   On: 01/10/2024 12:10    ECHO pending  TELEMETRY reviewed by me 01/17/2024: Sinus rhythm, rate  90s  EKG reviewed by me: Normal sinus rhythm low voltage nonspecific ST-T wave changes   Data reviewed by me 01/17/2024: last 24h vitals tele labs imaging I/O hospitalist progress notes.  Principal Problem:   Acute colitis Active Problems:   CKD (chronic kidney disease) stage 3, GFR 30-59 ml/min (HCC)   Bilateral occipital neuralgia   Chest pain   GERD without esophagitis   Anxiety and depression   Chronic asthma    ASSESSMENT AND PLAN:  CHANEQUA SPEES is a 78 y.o. female  with a past medical history of hypertension, diverticulosis, gastric ulcer, stage III large B-cell lymphoma in remission, asthma who presented to the ED on 01/14/2024 for lower abdominal pain to the umbilical area with associated nausea/vomiting.  Patient also endorses chest pressure.  Chest pain worsens with palpation.  Patient has no known cardiac history. Cardiology was consulted for further evaluation chest pressure.  # Atypical chest pain Patient with no known cardiac history. Reported chest pain likely from acute colitis, denies chest pain today.  EKG without acute ischemic changes.  Troponins negative x 2. BP remains stable. Echo  this admission with pEF, no RWMA.  -Will schedule outpatient cardiac follow-up.  # Acute Colitis CT abdomen with mild colitis.  Gram-negative rod bacteremia. -On IV Abx, Management per primary team.  No further cardiac recommendations at this time. Cardiology will sign off. Please haiku with questions or re-engage if needed.    This patient's plan of care was discussed and created with Dr. Ammon and he is in agreement.  Signed: Dorene Comfort, PA-C  01/17/2024, 1:48 PM [facility] Cardiology

## 2024-01-17 NOTE — Progress Notes (Signed)
 Progress Note    Erin [name]  FMW:[id number] DOB: Aug 29, 1945  DOA: 01/14/2024 PCP: [facility]      Brief Narrative:    Medical records reviewed and are as summarized below:  Erin [name] is a 78 y.o. female  with medical history significant for anxiety, asthma, stage III large B cell lymphoma in remission, diverticulosis, gastric ulcer and essential hypertension, who presented to the ED because of left-sided lower abdominal pain, periumbilical pain, nausea, vomiting and constipation.  She has had constipation for over a week.  She also had chest pain that she described as pressure in sensation and was graded as 8/10 in severity.  She said she fell while going to the bathroom and developed severe pain in the left ankle.  Reportedly when EMS picked her up, she was hypotensive with BP of 78/52, temperature 97.6 F, heart rate 78. In the ED, temperature was 99.3 per rectum, respiratory rate 12, heart rate 80 and O2 sat 98% on room air.   CT abdomen and pelvis with contrast: IMPRESSION: Mild inflammatory change adjacent to the distal descending colon, which may indicate mild colitis.  X-ray of left ankle: Acute nondisplaced fracture of the left lateral malleolus  Chest x-ray was unremarkable EKG showed normal sinus rhythm   Assessment/Plan:   Principal Problem:   Acute colitis Active Problems:   Chest pain   GERD without esophagitis   Anxiety and depression   CKD (chronic kidney disease) stage 3, GFR 30-59 ml/min (HCC)   Bilateral occipital neuralgia   Chronic asthma    Body mass index is 28.7 kg/m.   Acute colitis, gram-negative rod bacteremia: Blood culture from 01/14/2024 growing gram-negative rods in aerobic bottle.  Continue IV cefepime  and IV Flagyl .  Follow-up blood culture report.  Will keep on contact precautions pending final blood culture report Repeat blood cultures done on 01/17/2024   Chest pain: Resolved.   Troponins negative.  EKG unremarkable.   2D echo showed preserved EF 60 to 65%, grade 1 diastolic dysfunction.  Follow-up with cardiologist.   S/p mechanical fall, acute nondisplaced fracture of the left lateral malleolus: Consulted Dr. Cleotilde, orthopedic surgeon on-call, on 01/15/2024.  He recommended a splint, nonweightbearing on the left foot and outpatient follow-up. PT and OT recommended discharge to SNF.  Follow-up with TOC to assist with placement to SNF.     Hypokalemia: Improved.  Continue potassium repletion. CKD stage IIIb: Creatinine is stable   Comorbidities include anxiety, depression, asthma, bilateral occipital neuralgia, chronic hep B infection   Per medic report, patient has bed bugs at her house.     Diet Order             Diet Heart Fluid consistency: Thin  Diet effective now                            Consultants: Orthopedic surgeon  Procedures: None    Medications:    calcium -vitamin D   1 tablet Oral Daily   citalopram   20 mg Oral BH-q7a   cyanocobalamin   2,500 mcg Oral Daily   diphenhydrAMINE   25 mg Oral QHS   enoxaparin  (LOVENOX ) injection  40 mg Subcutaneous Q24H   entecavir   0.5 mg Oral Daily   ferrous sulfate   325 mg Oral Q breakfast   gabapentin   600 mg Oral TID   multivitamin with minerals  1 tablet Oral Daily   Continuous Infusions:  ceFEPime  (  MAXIPIME ) IV 2 g (01/17/24 1054)   metronidazole  500 mg (01/17/24 0915)     Anti-infectives (From admission, onward)    Start     Dose/Rate Route Frequency Ordered Stop   01/16/24 1600  ceFEPIme  (MAXIPIME ) 2 g in sodium chloride  0.9 % 100 mL IVPB        2 g 200 mL/hr over 30 Minutes Intravenous Every 12 hours 01/16/24 1207     01/15/24 1500  entecavir  (BARACLUDE ) tablet 0.5 mg        0.5 mg Oral Daily 01/15/24 0401     01/15/24 0600  cefTRIAXone  (ROCEPHIN ) 2 g in sodium chloride  0.9 % 100 mL IVPB  Status:  Discontinued        2 g 200 mL/hr over 30 Minutes Intravenous Every  24 hours 01/15/24 0553 01/16/24 1207   01/15/24 0600  metroNIDAZOLE  (FLAGYL ) IVPB 500 mg        500 mg 100 mL/hr over 60 Minutes Intravenous Every 12 hours 01/15/24 0553                Family Communication/Anticipated D/C date and plan/Code Status   DVT prophylaxis: enoxaparin  (LOVENOX ) injection 40 mg Start: 01/15/24 0800     Code Status: Full Code  Family Communication: None Disposition Plan: May need SNF at discharge   Status is: Inpatient Remains inpatient appropriate because: Acute colitis, left foot fracture       Subjective:   Interval events noted.  Abdominal pain has improved to just.  Mild pain.  She had a good bowel movement and she thinks that made a difference.  She still has pain in the left foot.  Rankin, PT was at the bedside.  Objective:    Vitals:   01/16/24 2142 01/17/24 0447 01/17/24 0812 01/17/24 1541  BP: 102/77 100/62 91/61 111/68  Pulse: 90 72 82 95  Resp: 20 18 17 19   Temp: 98.2 F (36.8 C) 98.2 F (36.8 C) 97.9 F (36.6 C) 98 F (36.7 C)  TempSrc: Oral Oral  Oral  SpO2: 99% 100% 95% 99%  Weight:      Height:       No data found.   Intake/Output Summary (Last 24 hours) at 01/17/2024 1631 Last data filed at 01/17/2024 1413 Gross per 24 hour  Intake 349.02 ml  Output 1150 ml  Net -800.98 ml   Filed Weights   01/15/24 2032  Weight: 73.5 kg    Exam:  GEN: NAD SKIN: Warm and dry EYES: No pallor or icterus ENT: MMM CV: RRR PULM: CTA B ABD: soft, ND, NT, +BS CNS: AAO x 3, non focal EXT: Bruising on left foot with tenderness.  Left distal left leg swelling      Data Reviewed:   I have personally reviewed following labs and imaging studies:  Labs: Labs show the following:   Basic Metabolic Panel: Recent Labs  Lab 01/15/24 0028 01/15/24 0430 01/16/24 0600 01/17/24 0552  NA 141 139 136 137  K 3.3* 3.2* 3.5 3.7  CL 104 107 102 100  CO2 27 26 26 29   GLUCOSE 116* 133* 112* 93  BUN 16 18 17 17    CREATININE 1.44* 1.54* 1.33* 1.33*  CALCIUM  9.3 8.7* 8.5* 8.8*  MG  --  2.4  --   --    GFR Estimated Creatinine Clearance: 34.5 mL/min (A) (by C-G formula based on SCr of 1.33 mg/dL (H)). Liver Function Tests: Recent Labs  Lab 01/15/24 0028  AST 23  ALT 14  ALKPHOS 66  BILITOT 0.5  PROT 7.3  ALBUMIN 3.6   Recent Labs  Lab 01/15/24 0028  LIPASE 43   No results for input(s): AMMONIA in the last 168 hours. Coagulation profile Recent Labs  Lab 01/14/24 2300  INR 1.4*    CBC: Recent Labs  Lab 01/14/24 2300 01/15/24 0204 01/15/24 0430 01/16/24 0600 01/17/24 0552  WBC 4.0 8.7 10.5 11.2* 6.5  NEUTROABS  --  6.7  --   --   --   HGB 7.4* 14.0 13.8 12.3 11.7*  HCT 23.0* 42.5 41.3 37.5 35.8*  MCV 97.5 93.4 93.7 94.9 94.7  PLT 89* 157 155 135* 127*   Cardiac Enzymes: No results for input(s): CKTOTAL, CKMB, CKMBINDEX, TROPONINI in the last 168 hours. BNP (last 3 results) No results for input(s): PROBNP in the last 8760 hours. CBG: No results for input(s): GLUCAP in the last 168 hours. D-Dimer: No results for input(s): DDIMER in the last 72 hours. Hgb A1c: No results for input(s): HGBA1C in the last 72 hours. Lipid Profile: No results for input(s): CHOL, HDL, LDLCALC, TRIG, CHOLHDL, LDLDIRECT in the last 72 hours. Thyroid function studies: No results for input(s): TSH, T4TOTAL, T3FREE, THYROIDAB in the last 72 hours.  Invalid input(s): FREET3 Anemia work up: No results for input(s): VITAMINB12, FOLATE, FERRITIN, TIBC, IRON , RETICCTPCT in the last 72 hours. Sepsis Labs: Recent Labs  Lab 01/14/24 2318 01/15/24 0204 01/15/24 0430 01/16/24 0600 01/17/24 0552  WBC  --  8.7 10.5 11.2* 6.5  LATICACIDVEN 1.1  --   --   --   --     Microbiology Recent Results (from the past 240 hours)  Blood culture (routine single)     Status: None (Preliminary result)   Collection Time: 01/14/24 11:18 PM   Specimen: BLOOD   Result Value Ref Range Status   Specimen Description   Final    BLOOD RIGHT ARM Performed at [facility], [address], [place], [place] [address]    Special Requests   Final    BOTTLES DRAWN AEROBIC AND ANAEROBIC Blood Culture adequate volume Performed at [facility], [address] [place], [place] [address]    Culture  Setup Time   Final    GRAM NEGATIVE RODS AEROBIC BOTTLE ONLY Organism ID to follow CRITICAL RESULT CALLED TO, READ BACK BY AND VERIFIED WITHBETHA LILIA EVERN ELIZA PHARMD [id number] 01/16/24 HNM Performed at [facility], [address], [place], [place] [address]    Culture   Final    VONNE NEGATIVE RODS IDENTIFICATION TO FOLLOW Performed at [facility], [address], [place], [place] [address]    Report Status PENDING  Incomplete  Blood Culture ID Panel (Reflexed)     Status: None   Collection Time: 01/14/24 11:18 PM  Result Value Ref Range Status   Enterococcus faecalis NOT DETECTED NOT DETECTED Final   Enterococcus Faecium NOT DETECTED NOT DETECTED Final   Listeria monocytogenes NOT DETECTED NOT DETECTED Final   Staphylococcus species NOT DETECTED NOT DETECTED Final   Staphylococcus aureus (BCID) NOT DETECTED NOT DETECTED Final   Staphylococcus epidermidis NOT DETECTED NOT DETECTED Final   Staphylococcus lugdunensis NOT DETECTED NOT DETECTED Final   Streptococcus species NOT DETECTED NOT DETECTED Final   Streptococcus agalactiae NOT DETECTED NOT DETECTED Final   Streptococcus pneumoniae NOT DETECTED NOT DETECTED Final   Streptococcus pyogenes NOT DETECTED NOT DETECTED Final   A.calcoaceticus-baumannii NOT DETECTED NOT DETECTED Final   Bacteroides fragilis NOT DETECTED  NOT DETECTED Final   Enterobacterales NOT DETECTED NOT DETECTED Final   Enterobacter cloacae complex NOT DETECTED NOT DETECTED Final   Escherichia coli NOT DETECTED NOT DETECTED Final   Klebsiella aerogenes NOT DETECTED NOT DETECTED Final    Klebsiella oxytoca NOT DETECTED NOT DETECTED Final   Klebsiella pneumoniae NOT DETECTED NOT DETECTED Final   Proteus species NOT DETECTED NOT DETECTED Final   Salmonella species NOT DETECTED NOT DETECTED Final   Serratia marcescens NOT DETECTED NOT DETECTED Final   Haemophilus influenzae NOT DETECTED NOT DETECTED Final   Neisseria meningitidis NOT DETECTED NOT DETECTED Final   Pseudomonas aeruginosa NOT DETECTED NOT DETECTED Final   Stenotrophomonas maltophilia NOT DETECTED NOT DETECTED Final   Candida albicans NOT DETECTED NOT DETECTED Final   Candida auris NOT DETECTED NOT DETECTED Final   Candida glabrata NOT DETECTED NOT DETECTED Final   Candida krusei NOT DETECTED NOT DETECTED Final   Candida parapsilosis NOT DETECTED NOT DETECTED Final   Candida tropicalis NOT DETECTED NOT DETECTED Final   Cryptococcus neoformans/gattii NOT DETECTED NOT DETECTED Final    Comment: Performed at [facility], [address] [place], [place] [address]    Procedures and diagnostic studies:  ECHOCARDIOGRAM COMPLETE Result Date: 01/17/2024    ECHOCARDIOGRAM REPORT   Patient Name:   Erin [name] Date of Exam: 01/16/2024 Medical Rec #:  [id number]        Height:       63.0 in Accession #:    [id number]       Weight:       162.0 lb Date of Birth:  January 15, 1946       BSA:          1.768 m Patient Age:    77 years         BP:           106/57 mmHg Patient Gender: F                HR:           87 bpm. Exam Location:  [facility] Procedure: 2D Echo, Cardiac Doppler and Color Doppler (Both Spectral and Color            Flow Doppler were utilized during procedure). Indications:     Chest Pain R07.9  History:         Patient has no prior history of Echocardiogram examinations.                  Signs/Symptoms:Syncope.  Sonographer:     Ashley McNeely-Sloane Referring Phys:  [id number] DORENE COMFORT Diagnosing Phys: Marsa Dooms MD IMPRESSIONS  1. Left ventricular ejection fraction, by estimation, is 60 to  65%. The left ventricle has normal function. The left ventricle has no regional wall motion abnormalities. Left ventricular diastolic parameters are consistent with Grade I diastolic dysfunction (impaired relaxation).  2. Right ventricular systolic function is normal. The right ventricular size is normal.  3. The mitral valve is normal in structure. Trivial mitral valve regurgitation. No evidence of mitral stenosis.  4. The aortic valve is normal in structure. Aortic valve regurgitation is not visualized. No aortic stenosis is present.  5. The inferior vena cava is normal in size with greater than 50% respiratory variability, suggesting right atrial pressure of 3 mmHg. FINDINGS  Left Ventricle: Left ventricular ejection fraction, by estimation, is 60 to 65%. The left ventricle has normal function. The left ventricle has no regional wall motion abnormalities. Strain was performed  and the global longitudinal strain is indeterminate. The left ventricular internal cavity size was normal in size. There is no left ventricular hypertrophy. Left ventricular diastolic parameters are consistent with Grade I diastolic dysfunction (impaired relaxation). Right Ventricle: The right ventricular size is normal. No increase in right ventricular wall thickness. Right ventricular systolic function is normal. Left Atrium: Left atrial size was normal in size. Right Atrium: Right atrial size was normal in size. Pericardium: There is no evidence of pericardial effusion. Mitral Valve: The mitral valve is normal in structure. Trivial mitral valve regurgitation. No evidence of mitral valve stenosis. MV peak gradient, 3.3 mmHg. The mean mitral valve gradient is 2.0 mmHg. Tricuspid Valve: The tricuspid valve is normal in structure. Tricuspid valve regurgitation is trivial. No evidence of tricuspid stenosis. Aortic Valve: The aortic valve is normal in structure. Aortic valve regurgitation is not visualized. No aortic stenosis is present. Aortic  valve mean gradient measures 4.0 mmHg. Aortic valve peak gradient measures 8.2 mmHg. Aortic valve area, by VTI measures 2.70 cm. Pulmonic Valve: The pulmonic valve was normal in structure. Pulmonic valve regurgitation is not visualized. No evidence of pulmonic stenosis. Aorta: The aortic root is normal in size and structure. Venous: The inferior vena cava is normal in size with greater than 50% respiratory variability, suggesting right atrial pressure of 3 mmHg. IAS/Shunts: No atrial level shunt detected by color flow Doppler. Additional Comments: 3D was performed not requiring image post processing on an independent workstation and was indeterminate.  LEFT VENTRICLE PLAX 2D LVIDd:         3.60 cm     Diastology LVIDs:         1.70 cm     LV e' medial:    7.72 cm/s LV PW:         0.90 cm     LV E/e' medial:  9.1 LV IVS:        0.70 cm     LV e' lateral:   10.30 cm/s LVOT diam:     2.00 cm     LV E/e' lateral: 6.8 LV SV:         69 LV SV Index:   39 LVOT Area:     3.14 cm  LV Volumes (MOD) LV vol d, MOD A2C: 56.4 ml LV vol d, MOD A4C: 50.9 ml LV vol s, MOD A2C: 19.9 ml LV vol s, MOD A4C: 22.1 ml LV SV MOD A2C:     36.5 ml LV SV MOD A4C:     50.9 ml LV SV MOD BP:      34.4 ml RIGHT VENTRICLE             IVC RV S prime:     11.50 cm/s  IVC diam: 1.10 cm TAPSE (M-mode): 1.5 cm LEFT ATRIUM             Index        RIGHT ATRIUM           Index LA diam:        3.10 cm 1.75 cm/m   RA Area:     12.30 cm LA Vol (A2C):   41.0 ml 23.19 ml/m  RA Volume:   29.70 ml  16.80 ml/m LA Vol (A4C):   22.6 ml 12.78 ml/m LA Biplane Vol: 30.7 ml 17.36 ml/m  AORTIC VALVE                    PULMONIC VALVE AV Area (Vmax):  2.72 cm     PV Vmax:        0.99 m/s AV Area (Vmean):   2.65 cm     PV Vmean:       66.000 cm/s AV Area (VTI):     2.70 cm     PV VTI:         0.175 m AV Vmax:           143.00 cm/s  PV Peak grad:   3.9 mmHg AV Vmean:          97.700 cm/s  PV Mean grad:   2.0 mmHg AV VTI:            0.257 m      RVOT Peak grad:  1 mmHg AV Peak Grad:      8.2 mmHg AV Mean Grad:      4.0 mmHg LVOT Vmax:         124.00 cm/s LVOT Vmean:        82.300 cm/s LVOT VTI:          0.221 m LVOT/AV VTI ratio: 0.86  AORTA Ao Root diam: 2.80 cm Ao Asc diam:  3.00 cm MITRAL VALVE MV Area (PHT): 3.53 cm    SHUNTS MV Area VTI:   3.29 cm    Systemic VTI:  0.22 m MV Peak grad:  3.3 mmHg    Systemic Diam: 2.00 cm MV Mean grad:  2.0 mmHg    Pulmonic VTI:  0.124 m MV Vmax:       0.91 m/s MV Vmean:      63.3 cm/s MV Decel Time: 215 msec MV E velocity: 70.20 cm/s MV A velocity: 98.50 cm/s MV E/A ratio:  0.71 Marsa Dooms MD Electronically signed by Marsa Dooms MD Signature Date/Time: 01/17/2024/1:23:04 PM    Final                LOS: 2 days   Rosaly Labarbera  [facility]   Pager on [email or web address]. If 7PM-7AM, please contact night-coverage at [email or web address]     01/17/2024, 4:31 PM

## 2024-01-18 DIAGNOSIS — R0789 Other chest pain: Secondary | ICD-10-CM

## 2024-01-18 DIAGNOSIS — N1832 Chronic kidney disease, stage 3b: Secondary | ICD-10-CM | POA: Diagnosis not present

## 2024-01-18 DIAGNOSIS — K529 Noninfective gastroenteritis and colitis, unspecified: Secondary | ICD-10-CM | POA: Diagnosis not present

## 2024-01-18 LAB — CBC
HCT: 35.2 % — ABNORMAL LOW (ref 36.0–46.0)
Hemoglobin: 11.4 g/dL — ABNORMAL LOW (ref 12.0–15.0)
MCH: 30.6 pg (ref 26.0–34.0)
MCHC: 32.4 g/dL (ref 30.0–36.0)
MCV: 94.4 fL (ref 80.0–100.0)
Platelets: 130 K/uL — ABNORMAL LOW (ref 150–400)
RBC: 3.73 MIL/uL — ABNORMAL LOW (ref 3.87–5.11)
RDW: 13 % (ref 11.5–15.5)
WBC: 4.9 K/uL (ref 4.0–10.5)
nRBC: 0 % (ref 0.0–0.2)

## 2024-01-18 LAB — BASIC METABOLIC PANEL WITH GFR
Anion gap: 5 (ref 5–15)
BUN: 14 mg/dL (ref 8–23)
CO2: 29 mmol/L (ref 22–32)
Calcium: 9.2 mg/dL (ref 8.9–10.3)
Chloride: 103 mmol/L (ref 98–111)
Creatinine, Ser: 1.29 mg/dL — ABNORMAL HIGH (ref 0.44–1.00)
GFR, Estimated: 43 mL/min — ABNORMAL LOW (ref >60)
Glucose, Bld: 103 mg/dL — ABNORMAL HIGH (ref 70–99)
Potassium: 3.9 mmol/L (ref 3.5–5.1)
Sodium: 137 mmol/L (ref 135–145)

## 2024-01-18 LAB — CULTURE, BLOOD (SINGLE)
Culture  Setup Time: NO GROWTH — AB
Special Requests: ADEQUATE

## 2024-01-18 LAB — MAGNESIUM: Magnesium: 2.7 mg/dL — ABNORMAL HIGH (ref 1.7–2.4)

## 2024-01-18 MED ORDER — ALBUTEROL SULFATE (2.5 MG/3ML) 0.083% IN NEBU: 3.0000 mL | INHALATION_SOLUTION | Freq: Four times a day (QID) | RESPIRATORY_TRACT | Status: DC | PRN

## 2024-01-18 MED ORDER — HYDROXYZINE HCL 10 MG/5ML PO SYRP
10.0000 mg | ORAL_SOLUTION | Freq: Three times a day (TID) | ORAL | Status: DC | PRN
  Administered 2024-01-18: 10 mg via ORAL
  Filled 2024-01-18 (×2): qty 5

## 2024-01-18 MED ORDER — LEVOFLOXACIN 500 MG PO TABS: 500.0000 mg | ORAL_TABLET | Freq: Once | ORAL | Status: DC

## 2024-01-18 MED ORDER — SODIUM CHLORIDE 0.9 % IV SOLN
2.0000 g | INTRAVENOUS | Status: DC
  Administered 2024-01-18 – 2024-01-20 (×3): 2 g via INTRAVENOUS
  Filled 2024-01-18 (×3): qty 20

## 2024-01-18 MED ORDER — METRONIDAZOLE 500 MG PO TABS
500.0000 mg | ORAL_TABLET | Freq: Two times a day (BID) | ORAL | Status: DC
  Administered 2024-01-18 – 2024-01-21 (×6): 500 mg via ORAL
  Filled 2024-01-18 (×6): qty 1

## 2024-01-18 MED ORDER — ALBUTEROL SULFATE HFA 108 (90 BASE) MCG/ACT IN AERS
2.0000 | INHALATION_SPRAY | Freq: Four times a day (QID) | RESPIRATORY_TRACT | Status: DC
  Administered 2024-01-18 – 2024-01-21 (×11): 2 via RESPIRATORY_TRACT
  Filled 2024-01-18: qty 6.7

## 2024-01-18 MED ORDER — LEVOFLOXACIN 250 MG PO TABS: 250.0000 mg | ORAL_TABLET | Freq: Every day | ORAL | Status: DC

## 2024-01-18 MED ORDER — ALBUTEROL SULFATE (2.5 MG/3ML) 0.083% IN NEBU: 3.0000 mL | INHALATION_SOLUTION | RESPIRATORY_TRACT | Status: DC | PRN

## 2024-01-18 NOTE — Progress Notes (Signed)
 Physical Therapy Treatment Patient Details Name: Erin [name] MRN: [id number] DOB: [birth year] Today's Date: 01/18/2024   History of Present Illness 78 y.o. female  with PMHx significant for anxiety, asthma, stage III large B cell lymphoma in remission, diverticulosis, gastric ulcer and essential HTN, who presented to the ED with left-sided lower abdominal pain, periumbilical pain, nausea, vomiting, constipation, chest pain 8/10, and L ankle pain after fall. Per medic, pt has bed bugs in home. Imaging reveals acute nondisplaced L lateral malleolus fracture. Per ortho, LLE NWBing in splint.    PT Comments  Pt was sitting in recliner, frustrated, upon arrival. She endorses having had BM and not being able to get assistance quickly. Author assisted with hygiene care prior to pt performing slide board transfer from recliner to EOB. Increased assistance versus earlier in the day with OT. Pt continues to be severely limited by her NWB restrictions. Was able to stand 2 x EOB with bed height elevated + max assist to prevent wt bearing. Pt fatigued quickly but does put forth great effort. DC recs remain appropriate due to pt's lack of assistance at DC + her wt bearing restrictions. Pt was independent PTA but now requires assistance with almost all ADLs. Acute PT will continue to follow per current POC.    If plan is discharge home, recommend the following: A lot of help with walking and/or transfers;A lot of help with bathing/dressing/bathroom;Assistance with feeding;Direct supervision/assist for medications management;Direct supervision/assist for financial management;Assist for transportation;Help with stairs or ramp for entrance;Supervision due to cognitive status     Equipment Recommendations  Other (comment) (Defer to next level of care)       Precautions / Restrictions Precautions Precautions: Fall Recall of Precautions/Restrictions: Impaired Precaution/Restrictions Comments: Per Charity fundraiser - BED  BUGS Restrictions Weight Bearing Restrictions Per Provider Order: Yes LLE Weight Bearing Per Provider Order: Non weight bearing     Mobility  Bed Mobility Overal bed mobility: Needs Assistance Bed Mobility: Sit to Supine  Sit to supine: Supervision, Used rails   Transfers Overall transfer level: Needs assistance Equipment used: Rolling walker (2 wheels), Sliding board Transfers: Sit to/from Stand, Bed to chair/wheelchair/BSC Sit to Stand: From elevated surface, Mod assist   Lateral/Scoot Transfers: Mod assist, With slide board, Max assist General transfer comment: pt performed slideboard transfer form recliner to EOB. she requested to stand several times once seated EOB with author elevating bed height + max vcs and assistancer to maintain NWB. Overall pt tolerated well from a pain standpoint but still struggles maintaining NWB LLE.    Ambulation/Gait  General Gait Details: deferred due to inability to maintain NWB in standing. Pt is more w/c appropriate at this time. Acute PT will continue to focus on improving pt's independnece with all ADLs    Balance Overall balance assessment: Needs assistance Sitting-balance support: Bilateral upper extremity supported Sitting balance-[name] Scale: Fair     Standing balance support: Bilateral upper extremity supported, During functional activity, Reliant on assistive device for balance Standing balance-[name] Scale: Poor       Communication Communication Communication: No apparent difficulties  Cognition Arousal: Alert Behavior During Therapy: WFL for tasks assessed/performed   PT - Cognitive impairments: No apparent impairments    PT - Cognition Comments: Pt is A and agreeable to session but frustrated about lack of Following commands: Intact      Cueing Cueing Techniques: Verbal cues, Tactile cues     General Comments General comments (skin integrity, edema, etc.): sp02 on RA WFL once 02  removed during session      Pertinent  Vitals/Pain Pain Assessment Pain Assessment: 0-10 Pain Score: 6  Pain Location: LLE Pain Descriptors / Indicators: Aching Pain Intervention(s): Limited activity within patient's tolerance, Monitored during session, Premedicated before session, Repositioned     PT Goals (current goals can now be found in the care plan section) Acute Rehab PT Goals Patient Stated Goal: Get better to return home Progress towards PT goals: Progressing toward goals    Frequency    Min 3X/week       AM-PAC PT 6 Clicks Mobility   Outcome Measure  Help needed turning from your back to your side while in a flat bed without using bedrails?: A Little Help needed moving from lying on your back to sitting on the side of a flat bed without using bedrails?: A Little Help needed moving to and from a bed to a chair (including a wheelchair)?: A Little Help needed standing up from a chair using your arms (e.g., wheelchair or bedside chair)?: A Lot Help needed to walk in hospital room?: Total Help needed climbing 3-5 steps with a railing? : Total 6 Click Score: 13    End of Session   Activity Tolerance: Patient tolerated treatment well Patient left: in chair;with call bell/phone within reach;with chair alarm set Nurse Communication: Mobility status PT Visit Diagnosis: Other abnormalities of gait and mobility (R26.89);Difficulty in walking, not elsewhere classified (R26.2);Muscle weakness (generalized) (M62.81);Pain Pain - Right/Left: Left Pain - part of body: Ankle and joints of foot     Time: 1421-1436 PT Time Calculation (min) (ACUTE ONLY): 15 min  Charges:    $Therapeutic Activity: 8-22 mins PT General Charges $$ ACUTE PT VISIT: 1 Visit                     Rankin Essex PTA 01/18/24, 3:37 PM

## 2024-01-18 NOTE — Progress Notes (Addendum)
 Pharmacy - Blood culture result Update  On 7/21 pharmacy received notification from [facility] lab that the single blood culture had GNR in the aerobic bottle. The BCID did not detect any organisms on the panel. Antibiotics were adjusted.    Microbiology lab attempted to identify the organism without success.  A smear was performed today and the micro tech identified a gram variable rod with spores consistent with bacillus species (non anthracis).    Repeat blood culture NG x 24h  Plan Discussed updated results with Dr Laurita and suspect the blood culture now represents a contaminant Adjust antibiotic and continue treatment for mild colitis seen on CT  ADDENDUM 01/18/2024 2:02 PM Notified by microbiology lab that there was another colony on the plate with different morphology then that bacillus previously reported.  Identification performed and came back as Eikenella.    -Antibiotics adjusted to ceftriaxone  (and metronidazole ) - ID evaluation 7/24  Celestine Slovak, PharmD, BCPS, BCIDP Work Cell: [phone] 01/18/2024 12:45 PM

## 2024-01-18 NOTE — Care Management Important Message (Signed)
 Important Message  Patient Details  Name: Erin [name] MRN: [id number] Date of Birth: 04-10-1946   Important Message Given:  Yes - Medicare IM     Breea Loncar W, CMA 01/18/2024, 12:36 PM

## 2024-01-18 NOTE — Progress Notes (Addendum)
  Progress Note   Patient: Erin [name] FMW:[id number] DOB: 1946/04/10 DOA: 01/14/2024     3 DOS: the patient was seen and examined on 01/18/2024   Brief hospital course: LEXII WALSH is a 78 y.o. female  with medical history significant for anxiety, asthma, stage III large B cell lymphoma in remission, diverticulosis, gastric ulcer and essential hypertension, who presented to the ED because of left-sided lower abdominal pain, periumbilical pain, nausea, vomiting and constipation.  She has had constipation for over a week.  She also had chest pain that she described as pressure in sensation and was graded as 8/10 in severity.  She said she fell while going to the bathroom and developed severe pain in the left ankle.  Acute nondisplaced fracture of the left lateral malleolus on x-ray. Abdominal CT scan also shows mild colitis.  Initial blood culture was a positive,   Repeat cultures negative. Eventually came back with [facility] . Condition has improved, currently pending nursing home placement   Principal Problem:   Acute colitis Active Problems:   Chest pain   GERD without esophagitis   Anxiety and depression   CKD (chronic kidney disease) stage 3, GFR 30-59 ml/min (HCC)   Bilateral occipital neuralgia   Chronic asthma   Assessment and Plan: * Acute colitis Eikenella Bacteremia  Patient came in with diarrhea, CT scan showed evidence of acute colitis.  Patient initially covered with Rocephin  and Flagyl .  Final culture from initial blood positive for Eikenella, consult ID.  Chest pain .  Had atypical chest pain, troponin negative. Seen by cardiology, no additional workup is needed  Chronic kidney disease stage IIIb. Hyperkalemia.    renal function stable, potassium normalized   GERD without esophagitis Continue PPI  Anxiety and depression - Will continue Celexa .  Chronic asthma - Will continue her inhalers.  Bilateral occipital neuralgia - Will continue  Neurontin .  Overweight BMI 28.70 Diet and exercise      Subjective:  Doing well today, no nausea vomiting abdominal pain.  Had normal-looking bowel movement.  Physical Exam: Vitals:   01/17/24 1541 01/17/24 2016 01/18/24 0253 01/18/24 0733  BP: 111/68 (!) 125/59 (!) 90/56 (!) 105/55  Pulse: 95 87 73 77  Resp: 19 16 18 15   Temp: 98 F (36.7 C) 99.3 F (37.4 C) 98.7 F (37.1 C) 97.9 F (36.6 C)  TempSrc: Oral Oral  Oral  SpO2: 99% 100% 99% 97%  Weight:      Height:       General exam: Appears calm and comfortable  Respiratory system: Clear to auscultation. Respiratory effort normal. Cardiovascular system: S1 & S2 heard, RRR. No JVD, murmurs, rubs, gallops or clicks. No pedal edema. Gastrointestinal system: Abdomen is nondistended, soft and nontender. No organomegaly or masses felt. Normal bowel sounds heard. Central nervous system: Alert and oriented. No focal neurological deficits. Extremities: Symmetric 5 x 5 power. Skin: No rashes, lesions or ulcers Psychiatry: Judgement and insight appear normal. Mood & affect appropriate.    Data Reviewed:  Reviewed x-rays, CT scan results, lab results.  Family Communication: None  Disposition: Status is: Inpatient Remains inpatient appropriate because: Unsafe discharge, pending nursing home placement.     Time spent: 50 minutes  Author: Murvin Mana, MD 01/18/2024 12:59 PM  For on call review [email or web address].

## 2024-01-18 NOTE — Plan of Care (Signed)

## 2024-01-18 NOTE — TOC Initial Note (Signed)
 Transition of Care [facility]) - Initial/Assessment Note    Patient Details  Name: Erin [name] MRN: [id number] Date of Birth: 1946-06-18  Transition of Care [facility]) CM/SW Contact:    Elouise LULLA Capri, RN 01/18/2024, 12:28 PM  Clinical Narrative:                  CM to patient's room regarding case management assessment. Per patient lives alone and has use of a walker. Per patient, was independent in ADLs prior to hospitalization.   Per patient requests CM discuss care with patient's daughter, Amy.  Noted SNF recommendations.   CM call placed to patient's daughter, Greig, phone: [phone] regarding SNF recommendations. Per patient's daughter, SNF preference is [facility].   CM will perform SNF workup.  Expected Discharge Plan: Skilled Nursing Facility Barriers to Discharge: Continued Medical Work up   Patient Goals and CMS Choice    SNF   Expected Discharge Plan and Services    SNF   Living arrangements for the past 2 months: Single Family Home      Prior Living Arrangements/Services Living arrangements for the past 2 months: Single Family Home Lives with:: Self   Do you feel safe going back to the place where you live?: Yes      Need for Family Participation in Patient Care: Yes (Comment) Care giver support system in place?: Yes (comment) Current home services: DME (walker) Criminal Activity/Legal Involvement Pertinent to Current Situation/Hospitalization: No - Comment as needed  Activities of Daily Living   ADL Screening (condition at time of admission) Independently performs ADLs?: No Does the patient have a NEW difficulty with bathing/dressing/toileting/self-feeding that is expected to last >3 days?: Yes (Initiates electronic notice to provider for possible OT consult) Does the patient have a NEW difficulty with getting in/out of bed, walking, or climbing stairs that is expected to last >3 days?: Yes (Initiates electronic notice to  provider for possible PT consult) Does the patient have a NEW difficulty with communication that is expected to last >3 days?: No Is the patient deaf or have difficulty hearing?: No Does the patient have difficulty seeing, even when wearing glasses/contacts?: No Does the patient have difficulty concentrating, remembering, or making decisions?: No  Permission Sought/Granted Permission sought to share information with : Case Manager, Family Supports Permission granted to share information with : Yes, Verbal Permission Granted  Share Information with NAME: [name]     Permission granted to share info w Relationship: Daughter     Emotional Assessment Appearance:: Appears stated age Attitude/Demeanor/Rapport: Engaged Affect (typically observed): Calm Orientation: : Oriented to Self, Oriented to Place, Oriented to Situation      Admission diagnosis:  Generalized abdominal pain [R10.84] Acute colitis [K52.9] Closed nondisplaced fracture of lateral malleolus of left fibula, initial encounter [S82.65XA] Constipation, unspecified constipation type [K59.00] Nausea and vomiting, unspecified vomiting type [R11.2] Patient Active Problem List   Diagnosis Date Noted   Acute colitis 01/15/2024   Chest pain 01/15/2024   GERD without esophagitis 01/15/2024   Anxiety and depression 01/15/2024   Chronic asthma 01/15/2024   Cervical radicular pain 03/10/2023   Cervical facet joint syndrome 03/10/2023   Bilateral occipital neuralgia 03/10/2023   Chronic pain syndrome 03/10/2023   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 03/17/2020   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 01/17/2020   Urge incontinence 01/17/2020   Pneumonia due to COVID-19 virus 06/27/2019   Respiratory failure with hypoxia (HCC) 06/27/2019   Chronic  anticoagulation 06/27/2019   CKD (chronic kidney disease) stage 3, GFR 30-59 ml/min (HCC) 06/27/2019   Sepsis (HCC) 10/01/2018   Asthma  exacerbation 10/05/2017   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 [place] [address] Phone: [phone] Fax: [phone]  [facility] [address] [place] - [address] [place] [address] Phone: [phone] Fax: [phone]     Social Drivers of Health (SDOH) Social History: SDOH Screenings   Food Insecurity: No Food Insecurity (01/15/2024)  Housing: Unknown (01/15/2024)  Transportation Needs: No Transportation Needs (01/15/2024)  Utilities: Not At Risk (01/15/2024)  Social Connections: Moderately Isolated (01/15/2024)  Tobacco Use: Low Risk  (01/15/2024)   SDOH Interventions:     Readmission Risk Interventions     No data to display

## 2024-01-18 NOTE — Progress Notes (Signed)
 Occupational Therapy Treatment Patient Details Name: Erin [name] MRN: [id number] DOB: [birth year] Today's Date: 01/18/2024   History of present illness 78 y.o. female  with PMHx significant for anxiety, asthma, stage III large B cell lymphoma in remission, diverticulosis, gastric ulcer and essential HTN, who presented to the ED with left-sided lower abdominal pain, periumbilical pain, nausea, vomiting, constipation, chest pain 8/10, and L ankle pain after fall. Per medic, pt has bed bugs in home. Imaging reveals acute nondisplaced L lateral malleolus fracture. Per ortho, LLE NWBing in splint.   OT comments  Pt is supine in bed on arrival. Easily arousable and agreeable to OT session. She continues to have pain in LLE with activity d/t difficulty with maintaining NWB. Pt performed bed mobility with supervision. Edu provided on sliding board transfers and trialed from bed to recliner this date. Pt required Min A d/t max verb cues and reminders to maintain NWB through LLE throughout transfer, although good technique otherwise to perform lateral scoots on board to reach recliner with general ease. Additional STS performed from recliner to RW with Mod A and further cueing to maintain NWB on LLE-OT placing foot behind pt's ankle and noted weight through OT's foot by pt. Once cues, pt able to correct and stand on RLE only. She tolerated ~1 min standing before returning to seated. Requesting a shower cap and hair wash-notified NT d/t no items being in the room. Pt left with all needs in place and will cont to require skilled acute OT services to maximize his safety and IND to return to PLOF.       If plan is discharge home, recommend the following:  A lot of help with bathing/dressing/bathroom;Assist for transportation;Assistance with cooking/housework;Help with stairs or ramp for entrance;A lot of help with walking and/or transfers   Equipment Recommendations  Other (comment) (defer to next venue)     Recommendations for Other Services      Precautions / Restrictions Precautions Precautions: Fall Recall of Precautions/Restrictions: Impaired Precaution/Restrictions Comments: Per Charity fundraiser - BED BUGS Restrictions Weight Bearing Restrictions Per Provider Order: Yes LLE Weight Bearing Per Provider Order: Non weight bearing       Mobility Bed Mobility Overal bed mobility: Needs Assistance Bed Mobility: Supine to Sit     Supine to sit: Supervision, Used rails          Transfers Overall transfer level: Needs assistance Equipment used: Rolling walker (2 wheels), Sliding board Transfers: Sit to/from Stand, Bed to chair/wheelchair/BSC Sit to Stand: Mod assist          Lateral/Scoot Transfers: With slide board, Mod assist, Min assist General transfer comment: trialed sliding board transfer from EOB to recliner, demo and verbally educated pt prior to attempt; she demo good technique and ease with lateral scoots to reach recliner however continues to need blocking and constant verb cues to maintain NWB to LLE; additional STS from recliner to RW with Mod A and further cues to prevent WB, although much improved with standing vs during transfer     Balance Overall balance assessment: Needs assistance Sitting-balance support: Bilateral upper extremity supported Sitting balance-[name] Scale: Fair     Standing balance support: Bilateral upper extremity supported, During functional activity, Reliant on assistive device for balance Standing balance-[name] Scale: Poor                             ADL either performed or assessed with clinical judgement   ADL Overall  ADL's : Needs assistance/impaired     Grooming: Sitting;Set up               Lower Body Dressing: Maximal assistance;Sitting/lateral leans Lower Body Dressing Details (indicate cue type and reason): adjust sock Toilet Transfer: Transfer board;Minimal assistance;Moderate assistance Toilet Transfer Details  (indicate cue type and reason): to recliner, Min/Mod A and constant cueing to maintain NWB to LLE even during sliding board transfer                Extremity/Trunk Assessment              Vision       Perception     Praxis     Communication Communication Communication: No apparent difficulties   Cognition Arousal: Alert Behavior During Therapy: WFL for tasks assessed/performed                                 Following commands: Intact        Cueing   Cueing Techniques: Verbal cues  Exercises Other Exercises Other Exercises: continued edu on NWB to LLE and she will benefit from continued education and practice on sliding board transfers to improve technique.    Shoulder Instructions       General Comments sp02 on RA WFL once 02 removed during session    Pertinent Vitals/ Pain       Pain Assessment Pain Assessment: Faces Faces Pain Scale: Hurts little more Pain Location: LLE Pain Descriptors / Indicators: Aching Pain Intervention(s): Limited activity within patient's tolerance, Monitored during session, Repositioned  Home Living                                          Prior Functioning/Environment              Frequency  Min 2X/week        Progress Toward Goals  OT Goals(current goals can now be found in the care plan section)  Progress towards OT goals: Progressing toward goals  Acute Rehab OT Goals Patient Stated Goal: get better OT Goal Formulation: With patient Time For Goal Achievement: 01/30/24 Potential to Achieve Goals: Good  Plan      Co-evaluation                 AM-PAC OT 6 Clicks Daily Activity     Outcome Measure   Help from another person eating meals?: None Help from another person taking care of personal grooming?: A Little Help from another person toileting, which includes using toliet, bedpan, or urinal?: A Lot Help from another person bathing (including washing,  rinsing, drying)?: A Lot Help from another person to put on and taking off regular upper body clothing?: A Little Help from another person to put on and taking off regular lower body clothing?: A Lot 6 Click Score: 16    End of Session Equipment Utilized During Treatment: Rolling walker (2 wheels) (sliding board)  OT Visit Diagnosis: Other abnormalities of gait and mobility (R26.89);Muscle weakness (generalized) (M62.81);Pain Pain - Right/Left: Left Pain - part of body: Ankle and joints of foot;Leg   Activity Tolerance Patient limited by pain;Patient tolerated treatment well   Patient Left in chair;with call bell/phone within reach;with chair alarm set   Nurse Communication Mobility status (asking for shower cap and hair wash)  Time: 9044-8975 OT Time Calculation (min): 29 min  Charges: OT General Charges $OT Visit: 1 Visit OT Treatments $Self Care/Home Management : 8-22 mins $Therapeutic Activity: 8-22 mins  Scarlettrose Costilow, OTR/L  01/18/24, 2:12 PM   Delita Chiquito E Irvin Bastin 01/18/2024, 2:05 PM

## 2024-01-18 NOTE — Hospital Course (Addendum)
 Erin [name] is a 78 y.o. female  with medical history significant for anxiety, asthma, stage III large B cell lymphoma in remission, diverticulosis, gastric ulcer and essential hypertension, who presented to the ED because of left-sided lower abdominal pain, periumbilical pain, nausea, vomiting and constipation.  She has had constipation for over a week.  She also had chest pain that she described as pressure in sensation and was graded as 8/10 in severity.  She said she fell while going to the bathroom and developed severe pain in the left ankle.  Acute nondisplaced fracture of the left lateral malleolus on x-ray. Abdominal CT scan also shows mild colitis.  Initial blood culture was a positive, but eventually came back with [facility] CORRODENS .Repeat cultures negative. Condition has improved, currently pending nursing home placement .  Seen by ID, deemed positive blood culture was from contaminants.  Not a true bacteremia. Patient has been approved for nursing placement, will transfer today.

## 2024-01-19 DIAGNOSIS — K219 Gastro-esophageal reflux disease without esophagitis: Secondary | ICD-10-CM | POA: Diagnosis not present

## 2024-01-19 DIAGNOSIS — R0789 Other chest pain: Secondary | ICD-10-CM | POA: Diagnosis not present

## 2024-01-19 DIAGNOSIS — N1832 Chronic kidney disease, stage 3b: Secondary | ICD-10-CM | POA: Diagnosis not present

## 2024-01-19 DIAGNOSIS — K529 Noninfective gastroenteritis and colitis, unspecified: Secondary | ICD-10-CM | POA: Diagnosis not present

## 2024-01-19 DIAGNOSIS — D696 Thrombocytopenia, unspecified: Secondary | ICD-10-CM

## 2024-01-19 DIAGNOSIS — A049 Bacterial intestinal infection, unspecified: Secondary | ICD-10-CM | POA: Diagnosis not present

## 2024-01-19 DIAGNOSIS — B9689 Other specified bacterial agents as the cause of diseases classified elsewhere: Secondary | ICD-10-CM

## 2024-01-19 DIAGNOSIS — R7881 Bacteremia: Secondary | ICD-10-CM | POA: Diagnosis not present

## 2024-01-19 MED ORDER — CALCIUM CARBONATE ANTACID 500 MG PO CHEW
1.0000 | CHEWABLE_TABLET | Freq: Once | ORAL | Status: AC
  Administered 2024-01-19: 200 mg via ORAL
  Filled 2024-01-19: qty 1

## 2024-01-19 MED ORDER — PANTOPRAZOLE SODIUM 40 MG PO TBEC
40.0000 mg | DELAYED_RELEASE_TABLET | Freq: Every day | ORAL | Status: DC
  Administered 2024-01-20 – 2024-01-21 (×2): 40 mg via ORAL
  Filled 2024-01-19 (×2): qty 1

## 2024-01-19 MED ORDER — PANTOPRAZOLE SODIUM 40 MG IV SOLR: 40.0000 mg | Freq: Once | INTRAVENOUS | Status: DC

## 2024-01-19 MED ORDER — PANTOPRAZOLE SODIUM 40 MG IV SOLR
40.0000 mg | Freq: Once | INTRAVENOUS | Status: AC
  Administered 2024-01-19: 40 mg via INTRAVENOUS
  Filled 2024-01-19: qty 10

## 2024-01-19 NOTE — Significant Event (Signed)
       CROSS COVER NOTE  NAME: Erin [name] MRN: [id number] DOB : 1945/09/19 ATTENDING PHYSICIAN: Laurita Pillion, MD    Date of Service   01/19/2024   HPI/Events of Note   Message received from RN via secure chat patient is c/o really bad reflux. I gave her the prn maalox. it hasn't worked well. can she have something daily like protonix  or something please? thank you   Interventions   Assessment/Plan: Patient with know GERD on omeprazole 20 mg BID at home. Not currently ordered here Protonix  40 mg IV x1 dose now and start daily oral protonix  40 mg daily tomorrow  Tums ordered for acute symptoms now        Erin [name] [facility] NP [facility] Cross Cover 7pm-7am - check amion for availability Pager [phone]

## 2024-01-19 NOTE — Progress Notes (Signed)
 OT Cancellation Note  Patient Details Name: Erin [name] MRN: [id number] DOB: 12-12-1945   Cancelled Treatment:    Reason Eval/Treat Not Completed: Patient declined, no reason specified;Pain limiting ability to participate.  Patient reported that she has significant heartburn and did not feel well enough to participate today.  RN notified of patient report of heartburn.  Erin [name] OTR/L   Erin [name] 01/19/2024, 3:13 PM

## 2024-01-19 NOTE — NC FL2 (Signed)
 [place]  MEDICAID FL2 LEVEL OF CARE FORM     IDENTIFICATION  Patient Name: Erin [name] Birthdate: October 07, 1945 Sex: female Admission Date (Current Location): 01/14/2024  [facility] and [place] Number:  Chiropodist and Address:  [facility], [address] [place], [place] [address]      Provider Number: [id number]  Attending Physician Name and Address:  Laurita Pillion, MD  Relative Name and Phone Number:  Greig Seip, daughter, phone:[phone]; Waylan Fireman, son, phone: [phone]; Lillian Cedar, phone: [phone]; Evalene Fireman, son, phone: [phone]    Current Level of Care: Hospital Recommended Level of Care: Skilled Nursing Facility Prior Approval Number:    Date Approved/Denied:   PASRR Number:    Discharge Plan: SNF    Current Diagnoses: Patient Active Problem List   Diagnosis Date Noted   Acute colitis 01/15/2024   Chest pain 01/15/2024   GERD without esophagitis 01/15/2024   Anxiety and depression 01/15/2024   Chronic asthma 01/15/2024   Cervical radicular pain 03/10/2023   Cervical facet joint syndrome 03/10/2023   Bilateral occipital neuralgia 03/10/2023   Chronic pain syndrome 03/10/2023   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 03/17/2020   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 01/17/2020   Urge incontinence 01/17/2020   Pneumonia due to COVID-19 virus 06/27/2019   Respiratory failure with hypoxia (HCC) 06/27/2019   Chronic anticoagulation 06/27/2019   CKD (chronic kidney disease) stage 3, GFR 30-59 ml/min (HCC) 06/27/2019   Sepsis (HCC) 10/01/2018   Asthma exacerbation 10/05/2017   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   Disease of liver 12/18/2014   Acute URI 11/07/2014   BP (high blood pressure) 05/09/2014   Chronic hepatitis B virus infection (HCC) 03/27/2011   Lymphoma (HCC) 10/27/2007    Orientation RESPIRATION BLADDER  Height & Weight     Self, Time, Situation, Place  Normal Incontinent Weight: 73.5 kg Height:  5' 3 (160 cm)  BEHAVIORAL SYMPTOMS/MOOD NEUROLOGICAL BOWEL NUTRITION STATUS      Continent  (Please see discharge summary)  AMBULATORY STATUS COMMUNICATION OF NEEDS Skin    (Deferred due to inability to maintain NWB standing)   Normal                       Personal Care Assistance Level of Assistance  Bathing, Feeding, Dressing Bathing Assistance: Limited assistance Feeding assistance: Limited assistance Dressing Assistance: Limited assistance     Functional Limitations Info             SPECIAL CARE FACTORS FREQUENCY  PT (By licensed PT), OT (By licensed OT)     PT Frequency: 5 x per week OT Frequency: 5 x per week            Contractures      Additional Factors Info  Code Status, Allergies Code Status Info: Full Code Allergies Info: Penicillins  High - Rash Comments  Prednisone  High - Hives  Sulfa Antibiotics  High - Hives, Shortness Of Breath  Enalapril    Received from outside source  No severity specified - Unknown Comments  Medrol  (methylprednisolone )  Low - Rash           Current Medications (01/19/2024):  This is the current hospital active medication list Current Facility-Administered Medications  Medication Dose Route Frequency Provider Last Rate Last Admin   acetaminophen  (TYLENOL ) tablet 650 mg  650 mg Oral Q6H PRN Jens Durand, MD   650 mg at  01/19/24 9082   Or   acetaminophen  (TYLENOL ) suppository 650 mg  650 mg Rectal Q6H PRN Jens Durand, MD       albuterol  (VENTOLIN  HFA) 108 (90 Base) MCG/ACT inhaler 2 puff  2 puff Inhalation Q6H Laurita Pillion, MD   2 puff at 01/19/24 0918   alum & mag hydroxide-simeth (MAALOX/MYLANTA) 200-200-20 MG/5ML suspension 15 mL  15 mL Oral Q4H PRN Jens Durand, MD   15 mL at 01/18/24 1433   calcium -vitamin D  (OSCAL WITH D) 500-5 MG-MCG per tablet 1 tablet  1 tablet Oral Daily Jens Durand, MD   1 tablet at 01/19/24  0911   cefTRIAXone  (ROCEPHIN ) 2 g in sodium chloride  0.9 % 100 mL IVPB  2 g Intravenous Q24H Laurita Pillion, MD   Stopped at 01/18/24 2214   citalopram  (CELEXA ) tablet 20 mg  20 mg Oral [place] Jens Durand, MD   20 mg at 01/19/24 9390   cyanocobalamin  (VITAMIN B12) tablet 2,500 mcg  2,500 mcg Oral Daily Jens Durand, MD   2,500 mcg at 01/19/24 9087   diphenhydrAMINE  (BENADRYL ) capsule 25 mg  25 mg Oral QHS Jens Durand, MD   25 mg at 01/18/24 2141   enoxaparin  (LOVENOX ) injection 40 mg  40 mg Subcutaneous Q24H Jens Durand, MD   40 mg at 01/19/24 9087   entecavir  (BARACLUDE ) tablet 0.5 mg  0.5 mg Oral Daily Jens Durand, MD   0.5 mg at 01/19/24 9082   ferrous sulfate  tablet 325 mg  325 mg Oral Q breakfast Jens Durand, MD   325 mg at 01/19/24 0911   gabapentin  (NEURONTIN ) capsule 600 mg  600 mg Oral TID Jens Durand, MD   600 mg at 01/19/24 0911   hydrOXYzine  (ATARAX ) 10 MG/5ML syrup 10 mg  10 mg Oral TID PRN Zhang, Dekui, MD   10 mg at 01/18/24 1921   magnesium  hydroxide (MILK OF MAGNESIA) suspension 30 mL  30 mL Oral Daily PRN Jens Durand, MD   30 mL at 01/16/24 1550   metroNIDAZOLE  (FLAGYL ) tablet 500 mg  500 mg Oral Q12H Zhang, Dekui, MD   500 mg at 01/19/24 9088   morphine  (PF) 2 MG/ML injection 2 mg  2 mg Intravenous Q4H PRN Jens Durand, MD   2 mg at 01/16/24 2111   multivitamin with minerals tablet 1 tablet  1 tablet Oral Daily Jens Durand, MD   1 tablet at 01/19/24 0911   nitroGLYCERIN  (NITROSTAT ) SL tablet 0.4 mg  0.4 mg Sublingual Q5 min PRN Jens Durand, MD       ondansetron  (ZOFRAN ) tablet 4 mg  4 mg Oral Q6H PRN Jens Durand, MD       Or   ondansetron  (ZOFRAN ) injection 4 mg  4 mg Intravenous Q6H PRN Jens Durand, MD       tiZANidine  (ZANAFLEX ) tablet 2 mg  2 mg Oral TID PRN Jens Durand, MD   2 mg at 01/17/24 2159   traMADol  (ULTRAM ) tablet 50 mg  50 mg Oral Q6H PRN Jens Durand, MD   50 mg at 01/16/24 1711   traZODone  (DESYREL ) tablet 25 mg  25 mg  Oral QHS PRN Jens Durand, MD   25 mg at 01/18/24 2141   Facility-Administered Medications Ordered in Other Encounters  Medication Dose Route Frequency Provider Last Rate Last Admin   sodium chloride  0.9 % injection 10 mL  10 mL Intravenous PRN Wilder Pink, MD   10 mL at 12/18/14 1121     Discharge Medications: Please see discharge  summary for a list of discharge medications.  Relevant Imaging Results:  Relevant Lab Results:   Additional Information SSN:[id number]  Elouise LULLA Capri, RN

## 2024-01-19 NOTE — TOC Progression Note (Addendum)
 Transition of Care [facility]) - Progression Note    Patient Details  Name: Erin [name] MRN: [id number] Date of Birth: [birth year]  Transition of Care [facility]) CM/SW Contact  Elouise LULLA Capri, RN 01/19/2024, 1:39 PM  Clinical Narrative:     CM completed FL2 and 30 day PASSR note, alert to Dr. Laurita regarding signature. CM faxed SNF referral to [facility], [facility], [facility] and [facility].   CM initiated PASSR screen in [place] MUST. PASSR under manual review. Required documentation uploaded to [facility] MUST for PASSR approval. CM will follow up.   Expected Discharge Plan: Skilled Nursing Facility Barriers to Discharge: Continued Medical Work up Expected Discharge Plan and Services    SNF   Living arrangements for the past 2 months: Single Family Home      Social Drivers of Health (SDOH) Interventions SDOH Screenings   Food Insecurity: No Food Insecurity (01/15/2024)  Housing: Unknown (01/15/2024)  Transportation Needs: No Transportation Needs (01/15/2024)  Utilities: Not At Risk (01/15/2024)  Social Connections: Moderately Isolated (01/15/2024)  Tobacco Use: Low Risk  (01/15/2024)    Readmission Risk Interventions     No data to display

## 2024-01-19 NOTE — Consult Note (Signed)
 NAME: Erin [name]  DOB: 1945-09-09  MRN: [id number]  Date/Time: 01/19/2024 2:56 PM  REQUESTING PROVIDER; Dr.Zhang Subjective:  REASON FOR CONSULT: bacteremia ? Erin [name] is a 78 y.o. female with a history of LBCL in remission, treated HEPB in 2000 , with reactivation in 2011 due to Rituximab therapy for lymphoma in 2009, started entecavir  with resolution but had to continue taking the medication due to recurrence of lymphoma in 2015 with subsequent chemo completed in 2015, and entecavir  DC in 2020 but she was taking sporadically until 2023 when it was completely stopped  Presents to the ED brought in by EMS on 01/14/24   with abdominal pain, and constipation for many days- As per EMS note there was fecal looking emesis in the waste bin in her house. She had also tripped and twisted and injured her left ankle In the ED vitals  01/14/24  BP 138/62  Temp 99 F (37.2 C)  Pulse Rate 83  Resp 15  SpO2 97 %     Latest Reference Range & Units 01/14/24  WBC 4.0 - 10.5 K/uL 4.0  Hemoglobin 12.0 - 15.0 g/dL 7.4 (L) [8]  HCT 63.9 - 46.0 % 23.0 (L)  Platelets 150 - 400 K/uL 89 (L) [2]  Data is abnormally low [1] QUESTIONABLE RESULTS POSSIBLE CONTAMINATION WITH IV FLUIDS. INTERPRET RESULTS WITH CAUTION. Repeat  Latest Reference Range & Units 01/15/24  WBC 4.0 - 10.5 K/uL 10.5  Hemoglobin 12.0 - 15.0 g/dL 86.1  HCT 63.9 - 53.9 % 41.3  Platelets 150 - 400 K/uL 155  Creatinine 0.44 - 1.00 mg/dL 8.45 (H)   BC sent CXR N Left ankle xray- acute non displaced fracture of left lateral malleolus CT abdomen showed mild inflammatory changes adjacent  to the distal descending colon Was started on ceftriaxone  and flagyl  She did not have any fever , chills Blood culture gram stain was reported as gram neg rod on 7/21- but BCID could not identify the bacteria Later it was identified as gram variable rod bacillus which was thought to be a contaminant and then  colony of Eikenella was  identified I am asked to see the patient  Pt says she has burining from the stomach to the throat, especially with eating Has generalized abdominal pain Says she used to take ibuprofen one a day for any body pain/headache      Past Medical History:  Diagnosis Date   Acute URI    Allergy    Anxiety    Asthma    well controlled   Cancer (HCC)    lymphoma in remission   Diverticulosis    DVT of upper extremity (deep vein thrombosis) (HCC) 07/2008 & 12/2007   Gastric ulcer 10/2007   EGD   GERD (gastroesophageal reflux disease)    Hepatitis B    treated   History of hiatal hernia    Hypertension    Laboratory confirmed diagnosis of COVID-19 06/27/2019   Lower extremity edema    Lymphoma (HCC)    Stage 3 large B cell lymphoma    Past Surgical History:  Procedure Laterality Date   ANTERIOR AND POSTERIOR REPAIR N/A 03/17/2020   Procedure: ANTERIOR (CYSTOCELE) AND POSTERIOR REPAIR (RECTOCELE);  Surgeon: Connell Davies, MD;  Location: [facility] ORS;  Service: Gynecology;  Laterality: N/A;   CHOLECYSTECTOMY     COLONOSCOPY  11/23/2007, 05/12/1994   COLONOSCOPY WITH PROPOFOL  N/A 03/31/2018   Procedure: COLONOSCOPY WITH PROPOFOL ;  Surgeon: Viktoria Lamar DASEN, MD;  Location: [facility] ENDOSCOPY;  Service: Endoscopy;  Laterality: N/A;   ESOPHAGOGASTRODUODENOSCOPY  09/03/2009, 10/14/2008, 09/30/2008, 11/23/2007   HEMORRHOID SURGERY     LAPAROSCOPIC VAGINAL HYSTERECTOMY WITH SALPINGO OOPHORECTOMY Bilateral 03/17/2020   Procedure: LAPAROSCOPIC ASSISTED VAGINAL HYSTERECTOMY WITH SALPINGO OOPHORECTOMY;  Surgeon: Connell Davies, MD;  Location: [facility] ORS;  Service: Gynecology;  Laterality: Bilateral;   LIVER BIOPSY  01/27/1996   PORTOCAVAL SHUNT PLACEMENT     and removal   PUBOVAGINAL SLING N/A 03/17/2020   Procedure: PUBO-VAGINAL SLING-TVT;  Surgeon: Connell Davies, MD;  Location: [facility] ORS;  Service: Gynecology;  Laterality: N/A;    Social History   Socioeconomic History   Marital status: Divorced    Spouse  name: Not on file   Number of children: Not on file   Years of education: Not on file   Highest education level: Not on file  Occupational History   Not on file  Tobacco Use   Smoking status: Never   Smokeless tobacco: Never  Vaping Use   Vaping status: Never Used  Substance and Sexual Activity   Alcohol use: No   Drug use: Never   Sexual activity: Not Currently  Other Topics Concern   Not on file  Social History Narrative   Independent at baseline, ambulates without any support   Lives by herself   Social Drivers of Corporate investment banker Strain: Not on file  Food Insecurity: No Food Insecurity (01/15/2024)   Hunger Vital Sign    Worried About Running Out of Food in the Last Year: Never true    Ran Out of Food in the Last Year: Never true  Transportation Needs: No Transportation Needs (01/15/2024)   PRAPARE - Administrator, Civil Service (Medical): No    Lack of Transportation (Non-Medical): No  Physical Activity: Not on file  Stress: Not on file  Social Connections: Moderately Isolated (01/15/2024)   Social Connection and Isolation Panel    Frequency of Communication with Friends and Family: Twice a week    Frequency of Social Gatherings with Friends and Family: Twice a week    Attends Religious Services: 1 to 4 times per year    Active Member of [employer] or Organizations: No    Attends Banker Meetings: Never    Marital Status: Divorced  Catering manager Violence: Not At Risk (01/15/2024)   Humiliation, Afraid, Rape, and Kick questionnaire    Fear of Current or Ex-Partner: No    Emotionally Abused: No    Physically Abused: No    Sexually Abused: No    Family History  Problem Relation Age of Onset   Cancer Daughter    Asthma Mother    Breast cancer Neg Hx    Allergies  Allergen Reactions   Penicillins Rash    Has patient had a PCN reaction causing immediate rash, facial/tongue/throat swelling, SOB or lightheadedness with hypotension:  Yes Has patient had a PCN reaction causing severe rash involving mucus membranes or skin necrosis: No Has patient had a PCN reaction that required hospitalization: No Has patient had a PCN reaction occurring within the last 10 years: No If all of the above answers are NO, then may proceed with Cephalosporin use.   Prednisone Hives   Sulfa Antibiotics Hives and Shortness Of Breath   Enalapril     Hypotension    Medrol  [Methylprednisolone ] Rash   I? Current Facility-Administered Medications  Medication Dose Route Frequency Provider Last Rate Last Admin   acetaminophen  (TYLENOL ) tablet 650 mg  650 mg  Oral Q6H PRN Jens Durand, MD   650 mg at 01/19/24 9082   Or   acetaminophen  (TYLENOL ) suppository 650 mg  650 mg Rectal Q6H PRN Jens Durand, MD       albuterol  (VENTOLIN  HFA) 108 (90 Base) MCG/ACT inhaler 2 puff  2 puff Inhalation Q6H Laurita Pillion, MD   2 puff at 01/19/24 1426   alum & mag hydroxide-simeth (MAALOX/MYLANTA) 200-200-20 MG/5ML suspension 15 mL  15 mL Oral Q4H PRN Jens Durand, MD   15 mL at 01/19/24 1425   calcium -vitamin D  (OSCAL WITH D) 500-5 MG-MCG per tablet 1 tablet  1 tablet Oral Daily Jens Durand, MD   1 tablet at 01/19/24 0911   cefTRIAXone  (ROCEPHIN ) 2 g in sodium chloride  0.9 % 100 mL IVPB  2 g Intravenous Q24H Laurita Pillion, MD   Stopped at 01/18/24 2214   citalopram  (CELEXA ) tablet 20 mg  20 mg Oral [place] Jens Durand, MD   20 mg at 01/19/24 9390   cyanocobalamin  (VITAMIN B12) tablet 2,500 mcg  2,500 mcg Oral Daily Jens Durand, MD   2,500 mcg at 01/19/24 9087   diphenhydrAMINE  (BENADRYL ) capsule 25 mg  25 mg Oral QHS Jens Durand, MD   25 mg at 01/18/24 2141   enoxaparin  (LOVENOX ) injection 40 mg  40 mg Subcutaneous Q24H Jens Durand, MD   40 mg at 01/19/24 9087   entecavir  (BARACLUDE ) tablet 0.5 mg  0.5 mg Oral Daily Jens Durand, MD   0.5 mg at 01/19/24 9082   ferrous sulfate  tablet 325 mg  325 mg Oral Q breakfast Jens Durand, MD   325 mg at  01/19/24 0911   gabapentin  (NEURONTIN ) capsule 600 mg  600 mg Oral TID Jens Durand, MD   600 mg at 01/19/24 0911   hydrOXYzine  (ATARAX ) 10 MG/5ML syrup 10 mg  10 mg Oral TID PRN Zhang, Dekui, MD   10 mg at 01/18/24 1921   magnesium  hydroxide (MILK OF MAGNESIA) suspension 30 mL  30 mL Oral Daily PRN Jens Durand, MD   30 mL at 01/16/24 1550   metroNIDAZOLE  (FLAGYL ) tablet 500 mg  500 mg Oral Q12H Laurita Pillion, MD   500 mg at 01/19/24 0911   morphine  (PF) 2 MG/ML injection 2 mg  2 mg Intravenous Q4H PRN Jens Durand, MD   2 mg at 01/16/24 2111   multivitamin with minerals tablet 1 tablet  1 tablet Oral Daily Jens Durand, MD   1 tablet at 01/19/24 9088   nitroGLYCERIN  (NITROSTAT ) SL tablet 0.4 mg  0.4 mg Sublingual Q5 min PRN Jens Durand, MD       ondansetron  (ZOFRAN ) tablet 4 mg  4 mg Oral Q6H PRN Jens Durand, MD       Or   ondansetron  (ZOFRAN ) injection 4 mg  4 mg Intravenous Q6H PRN Jens Durand, MD       tiZANidine  (ZANAFLEX ) tablet 2 mg  2 mg Oral TID PRN Jens Durand, MD   2 mg at 01/17/24 2159   traMADol  (ULTRAM ) tablet 50 mg  50 mg Oral Q6H PRN Jens Durand, MD   50 mg at 01/16/24 1711   traZODone  (DESYREL ) tablet 25 mg  25 mg Oral QHS PRN Jens Durand, MD   25 mg at 01/18/24 2141   Facility-Administered Medications Ordered in Other Encounters  Medication Dose Route Frequency Provider Last Rate Last Admin   sodium chloride  0.9 % injection 10 mL  10 mL Intravenous PRN Wilder Pink, MD   10 mL at 12/18/14  1121     Abtx:  Anti-infectives (From admission, onward)    Start     Dose/Rate Route Frequency Ordered Stop   01/19/24 2200  levofloxacin  (LEVAQUIN ) tablet 250 mg  Status:  Discontinued        250 mg Oral Daily at bedtime 01/18/24 1248 01/18/24 1404   01/18/24 2200  metroNIDAZOLE  (FLAGYL ) tablet 500 mg        500 mg Oral Every 12 hours 01/18/24 1248     01/18/24 2200  levofloxacin  (LEVAQUIN ) tablet 500 mg  Status:  Discontinued        500 mg Oral Once  01/18/24 1248 01/18/24 1404   01/18/24 2200  cefTRIAXone  (ROCEPHIN ) 2 g in sodium chloride  0.9 % 100 mL IVPB        2 g 200 mL/hr over 30 Minutes Intravenous Every 24 hours 01/18/24 1404     01/16/24 1600  ceFEPIme  (MAXIPIME ) 2 g in sodium chloride  0.9 % 100 mL IVPB  Status:  Discontinued        2 g 200 mL/hr over 30 Minutes Intravenous Every 12 hours 01/16/24 1207 01/18/24 1248   01/15/24 1500  entecavir  (BARACLUDE ) tablet 0.5 mg        0.5 mg Oral Daily 01/15/24 0401     01/15/24 0600  cefTRIAXone  (ROCEPHIN ) 2 g in sodium chloride  0.9 % 100 mL IVPB  Status:  Discontinued        2 g 200 mL/hr over 30 Minutes Intravenous Every 24 hours 01/15/24 0553 01/16/24 1207   01/15/24 0600  metroNIDAZOLE  (FLAGYL ) IVPB 500 mg  Status:  Discontinued        500 mg 100 mL/hr over 60 Minutes Intravenous Every 12 hours 01/15/24 0553 01/18/24 1248       REVIEW OF SYSTEMS:  Const: negative fever, negative chills, negative weight loss, has weakness Eyes: negative diplopia or visual changes, negative eye pain ENT: negative coryza, negative sore throat Resp: negative cough, hemoptysis, dyspnea Cards: negative for chest pain, palpitations, lower extremity edema GU: negative for frequency, dysuria and hematuria GI: has abdominal pain, burning  Skin: negative for rash and pruritus Heme: negative for easy bruising and gum/nose bleeding MS: weakness, fall Neurolo:negative for headaches, dizziness, vertigo, memory problems  Psych: negative for feelings of anxiety, depression  Endocrine: negative for thyroid, diabetes Allergy/Immunology- penicillin sulfa antibiotics and other meds as listed Objective:  VITALS:  BP (!) 101/57   Pulse 81   Temp 98.5 F (36.9 C) (Oral)   Resp 17   Ht 5' 3 (1.6 m)   Wt 73.5 kg   SpO2 95%   BMI 28.70 kg/m  LDA PORT PHYSICAL EXAM:  General: Alert, cooperative, no distress, appears stated age. anxious Head: Normocephalic, without obvious abnormality,  atraumatic. Eyes: Conjunctivae clear, anicteric sclerae. Pupils are equal ENT Nares normal. No drainage or sinus tenderness. Lips, mucosa, and tongue normal. No Thrush Neck: Supple, symmetrical, no adenopathy, thyroid: non tender no carotid bruit and no JVD. Back: No CVA tenderness. Lungs: Clear to auscultation bilaterally. No Wheezing or Rhonchi. No rales. Heart: Regular rate and rhythm, no murmur, rub or gallop. Abdomen: Soft, non-tender,not distended. Bowel sounds normal. No masses Extremities: bruising left ankle Skin: No rashes or lesions. Or bruising Lymph: Cervical, supraclavicular normal. Neurologic: Grossly non-focal Pertinent Labs Lab Results CBC    Component Value Date/Time   WBC 4.9 01/18/2024 0505   RBC 3.73 (L) 01/18/2024 0505   HGB 11.4 (L) 01/18/2024 0505   HGB 14.1 11/23/2022 1506  HGB 11.3 (L) 06/18/2014 1113   HCT 35.2 (L) 01/18/2024 0505   HCT 33.5 (L) 06/18/2014 1113   PLT 130 (L) 01/18/2024 0505   PLT 174 11/23/2022 1506   PLT 161 06/18/2014 1113   MCV 94.4 01/18/2024 0505   MCV 98 06/18/2014 1113   MCH 30.6 01/18/2024 0505   MCHC 32.4 01/18/2024 0505   RDW 13.0 01/18/2024 0505   RDW 13.8 06/18/2014 1113   LYMPHSABS 1.3 01/15/2024 0204   LYMPHSABS 0.8 (L) 06/18/2014 1113   MONOABS 0.6 01/15/2024 0204   MONOABS 0.4 06/18/2014 1113   EOSABS 0.1 01/15/2024 0204   EOSABS 0.1 06/18/2014 1113   BASOSABS 0.0 01/15/2024 0204   BASOSABS 0.0 06/18/2014 1113       Latest Ref Rng & Units 01/18/2024    5:05 AM 01/17/2024    5:52 AM 01/16/2024    6:00 AM  CMP  Glucose 70 - 99 mg/dL 896  93  887   BUN 8 - 23 mg/dL 14  17  17    Creatinine 0.44 - 1.00 mg/dL 8.70  8.66  8.66   Sodium 135 - 145 mmol/L 137  137  136   Potassium 3.5 - 5.1 mmol/L 3.9  3.7  3.5   Chloride 98 - 111 mmol/L 103  100  102   CO2 22 - 32 mmol/L 29  29  26    Calcium  8.9 - 10.3 mg/dL 9.2  8.8  8.5       Microbiology: Recent Results (from the past 240 hours)  Blood culture  (routine single)     Status: Abnormal   Collection Time: 01/14/24 11:18 PM   Specimen: BLOOD  Result Value Ref Range Status   Specimen Description   Final    BLOOD RIGHT ARM Performed at [facility], [address] [place], [place] [address]    Special Requests   Final    BOTTLES DRAWN AEROBIC AND ANAEROBIC Blood Culture adequate volume Performed at [facility], [address] [place], [place] [address]    Culture  Setup Time (A)  Final    GRAM VARIABLE ROD AEROBIC BOTTLE ONLY Organism ID to follow CORRECTED RESULTS PREVIOUSLY REPORTED AS: GRAM NEGATIVE RODS CORRECTED RESULTS CALLED TO: A. CHAPPELL PHARMD, AT 1133 01/18/24 D. VANHOOK    Culture (A)  Final    EIKENELLA CORRODENS Usually susceptible to penicillin and other beta lactam agents,quinolones,macrolides and tetracyclines. BACILLUS SPECIES Standardized susceptibility testing for this organism is not available. Performed at [facility], [address] [place], [place] [address]    Report Status 01/18/2024 FINAL  Final  Blood Culture ID Panel (Reflexed)     Status: None   Collection Time: 01/14/24 11:18 PM  Result Value Ref Range Status   Enterococcus faecalis NOT DETECTED NOT DETECTED Final   Enterococcus Faecium NOT DETECTED NOT DETECTED Final   Listeria monocytogenes NOT DETECTED NOT DETECTED Final   Staphylococcus species NOT DETECTED NOT DETECTED Final   Staphylococcus aureus (BCID) NOT DETECTED NOT DETECTED Final   Staphylococcus epidermidis NOT DETECTED NOT DETECTED Final   Staphylococcus lugdunensis NOT DETECTED NOT DETECTED Final   Streptococcus species NOT DETECTED NOT DETECTED Final   Streptococcus agalactiae NOT DETECTED NOT DETECTED Final   Streptococcus pneumoniae NOT DETECTED NOT DETECTED Final   Streptococcus pyogenes NOT DETECTED NOT DETECTED Final   A.calcoaceticus-baumannii NOT DETECTED NOT DETECTED Final   Bacteroides fragilis NOT DETECTED NOT DETECTED Final    Enterobacterales NOT DETECTED NOT DETECTED Final  Enterobacter cloacae complex NOT DETECTED NOT DETECTED Final   Escherichia coli NOT DETECTED NOT DETECTED Final   Klebsiella aerogenes NOT DETECTED NOT DETECTED Final   Klebsiella oxytoca NOT DETECTED NOT DETECTED Final   Klebsiella pneumoniae NOT DETECTED NOT DETECTED Final   Proteus species NOT DETECTED NOT DETECTED Final   Salmonella species NOT DETECTED NOT DETECTED Final   Serratia marcescens NOT DETECTED NOT DETECTED Final   Haemophilus influenzae NOT DETECTED NOT DETECTED Final   Neisseria meningitidis NOT DETECTED NOT DETECTED Final   Pseudomonas aeruginosa NOT DETECTED NOT DETECTED Final   Stenotrophomonas maltophilia NOT DETECTED NOT DETECTED Final   Candida albicans NOT DETECTED NOT DETECTED Final   Candida auris NOT DETECTED NOT DETECTED Final   Candida glabrata NOT DETECTED NOT DETECTED Final   Candida krusei NOT DETECTED NOT DETECTED Final   Candida parapsilosis NOT DETECTED NOT DETECTED Final   Candida tropicalis NOT DETECTED NOT DETECTED Final   Cryptococcus neoformans/gattii NOT DETECTED NOT DETECTED Final    Comment: Performed at [facility], [address] [place], [place] [address]  Culture, blood (Routine X 2) w Reflex to ID Panel     Status: None (Preliminary result)   Collection Time: 01/17/24  5:52 AM   Specimen: BLOOD  Result Value Ref Range Status   Specimen Description BLOOD BLOOD LEFT ARM  Final   Special Requests   Final    BOTTLES DRAWN AEROBIC AND ANAEROBIC Blood Culture adequate volume   Culture   Final    NO GROWTH 2 DAYS Performed at [facility], [address] [place], [place] [address]    Report Status PENDING  Incomplete  Culture, blood (Routine X 2) w Reflex to ID Panel     Status: None (Preliminary result)   Collection Time: 01/17/24  5:52 AM   Specimen: BLOOD  Result Value Ref Range Status   Specimen Description BLOOD BLOOD RIGHT HAND  Final   Special Requests    Final    BOTTLES DRAWN AEROBIC AND ANAEROBIC Blood Culture adequate volume   Culture   Final    NO GROWTH 2 DAYS Performed at [facility], [address] [place], [place] [address]    Report Status PENDING  Incomplete    IMAGING RESULTS: CT abdomen reviewed- mild inflammation adjacent to the descending colon distally I have personally reviewed the films ? Impression/Recommendation ?Bacillus and eikenella in the blood- identified nearly 40 hours later in a single set  makes this  likely contaminants rather than bacteria of clinical significance.. I cannot corroborate this with her clinical picture. Repeat blood culture negative-  She  has a PORT-  no need to remove it She was admitted with abdominal pain and constipation and CT showed distal descending colon inflammation- on ceftriaxone  and flagyl - finish a total of 7 days No fever or leucocytosis She now has GERD like symptoms She had been taking Ibuprofen almost every day until this admission so need to r/o gastritis/esophagitis  ?Lymphoma treated in remission  HepB treated in 1997-2000- reactivated in 2010 which Rituximab and steroids and was retreated with entecavir  until 2023- resolution of DNA and surface antigen- Entecvir was Dc by her GI in 2023 She has been on it this admission- DC  CKD  Thrombocytopenia-   Fall- fracture left lateral malleolus-    This consult involves complex antimicrobial management  Discussed the management with patient and her daughter  ________________________________________________

## 2024-01-19 NOTE — Plan of Care (Signed)
  Problem: Clinical Measurements: Goal: Diagnostic test results will improve Outcome: Progressing   Problem: Activity: Goal: Risk for activity intolerance will decrease Outcome: Progressing   Problem: Coping: Goal: Level of anxiety will decrease Outcome: Progressing   Problem: Pain Managment: Goal: General experience of comfort will improve and/or be controlled Outcome: Progressing   Problem: Safety: Goal: Ability to remain free from injury will improve Outcome: Progressing

## 2024-01-19 NOTE — TOC Progression Note (Signed)
 Transition of [facility]) - Progression Note    Patient Details  Name: Erin [name] MRN: [id number] Date of Birth: 27-Feb-1946  Transition [facility]) CM/SW Contact  Elouise LULLA Capri, RN 01/19/2024, 1:15 PM  Clinical Narrative:     The above named patient is recommended to go to Short Term Rehab for strengthening and gait training for balance.  It is expected that the Short Term Rehab stay will be less than 30 days.  The patient is expected to return home after Rehab.   Expected Discharge Plan: Skilled Nursing Facility Barriers to Discharge: Continued Medical Work up  Expected Discharge Plan and Services    SNF   Living arrangements for the past 2 months: Single Family Home                   Social Drivers of Health (SDOH) Interventions SDOH Screenings   Food Insecurity: No Food Insecurity (01/15/2024)  Housing: Unknown (01/15/2024)  Transportation Needs: No Transportation Needs (01/15/2024)  Utilities: Not At Risk (01/15/2024)  Social Connections: Moderately Isolated (01/15/2024)  Tobacco Use: Low Risk  (01/15/2024)    Readmission Risk Interventions     No data to display

## 2024-01-19 NOTE — Progress Notes (Signed)
  Progress Note   Patient: Erin [name] FMW:[id number] DOB: 07/02/1945 DOA: 01/14/2024     4 DOS: the patient was seen and examined on 01/19/2024   Brief hospital course: DOREEN GARRETSON is a 78 y.o. female  with medical history significant for anxiety, asthma, stage III large B cell lymphoma in remission, diverticulosis, gastric ulcer and essential hypertension, who presented to the ED because of left-sided lower abdominal pain, periumbilical pain, nausea, vomiting and constipation.  She has had constipation for over a week.  She also had chest pain that she described as pressure in sensation and was graded as 8/10 in severity.  She said she fell while going to the bathroom and developed severe pain in the left ankle.  Acute nondisplaced fracture of the left lateral malleolus on x-ray. Abdominal CT scan also shows mild colitis.  Initial blood culture was a positive, but eventually came back with EIKENELLA CORRODENS .Repeat cultures negative. Condition has improved, currently pending nursing home placement    Principal Problem:   Acute colitis Active Problems:   Chest pain   GERD without esophagitis   Anxiety and depression   CKD (chronic kidney disease) stage 3, GFR 30-59 ml/min (HCC)   Bilateral occipital neuralgia   Chronic asthma   Assessment and Plan: * Acute colitis Eikenella Bacteremia secondary to colitis. Patient came in with diarrhea, CT scan showed evidence of acute colitis.  Patient initially covered with Rocephin  and Flagyl .  Final culture from initial blood positive for Eikenella, ID consult obtained. Patient condition improving, no additional diarrhea.   Chest pain .  Had atypical chest pain, troponin negative. Seen by cardiology, no additional workup is needed   Chronic kidney disease stage IIIb. Hyperkalemia.    renal function stable, potassium normalized     GERD without esophagitis Continue PPI   Anxiety and depression - Will continue Celexa .    Chronic asthma - Will continue her inhalers.   Bilateral occipital neuralgia - Will continue Neurontin .   Overweight BMI 28.70 Diet and exercise        Subjective:  Patient doing well today, no abdominal pain or nausea vomiting.  Physical Exam: Vitals:   01/18/24 1657 01/18/24 2022 01/19/24 0432 01/19/24 0740  BP: 117/75 104/70 (!) 102/92 (!) 101/57  Pulse: 89 96 96 81  Resp: 16 17 18 17   Temp: 98.5 F (36.9 C) 98.2 F (36.8 C) 98.2 F (36.8 C) 98.5 F (36.9 C)  TempSrc: Oral  Oral Oral  SpO2: 98% 98% 94% 95%  Weight:      Height:       General exam: Appears calm and comfortable  Respiratory system: Clear to auscultation. Respiratory effort normal. Cardiovascular system: S1 & S2 heard, RRR. No JVD, murmurs, rubs, gallops or clicks. No pedal edema. Gastrointestinal system: Abdomen is nondistended, soft and nontender. No organomegaly or masses felt. Normal bowel sounds heard. Central nervous system: Alert and oriented. No focal neurological deficits. Extremities: Symmetric 5 x 5 power. Skin: No rashes, lesions or ulcers Psychiatry: Judgement and insight appear normal. Mood & affect appropriate.    Data Reviewed:  Lab results reviewed  Family Communication: Daughter updated over the phone.  Disposition: Status is: Inpatient Remains inpatient appropriate because: Severity of disease, IV treatment. Pending placement.     Time spent: 35 minutes  Author: Murvin Mana, MD 01/19/2024 11:43 AM  For on call review [email or web address].

## 2024-01-20 DIAGNOSIS — R7881 Bacteremia: Secondary | ICD-10-CM

## 2024-01-20 DIAGNOSIS — A049 Bacterial intestinal infection, unspecified: Secondary | ICD-10-CM | POA: Diagnosis not present

## 2024-01-20 DIAGNOSIS — R0789 Other chest pain: Secondary | ICD-10-CM | POA: Diagnosis not present

## 2024-01-20 DIAGNOSIS — K529 Noninfective gastroenteritis and colitis, unspecified: Secondary | ICD-10-CM | POA: Diagnosis not present

## 2024-01-20 DIAGNOSIS — N1832 Chronic kidney disease, stage 3b: Secondary | ICD-10-CM | POA: Diagnosis not present

## 2024-01-20 DIAGNOSIS — K219 Gastro-esophageal reflux disease without esophagitis: Secondary | ICD-10-CM | POA: Diagnosis not present

## 2024-01-20 DIAGNOSIS — B9689 Other specified bacterial agents as the cause of diseases classified elsewhere: Secondary | ICD-10-CM | POA: Diagnosis not present

## 2024-01-20 LAB — HEPATIC FUNCTION PANEL
ALT: 23 U/L (ref 0–44)
AST: 34 U/L (ref 15–41)
Albumin: 2.9 g/dL — ABNORMAL LOW (ref 3.5–5.0)
Alkaline Phosphatase: 64 U/L (ref 38–126)
Bilirubin, Direct: 0.1 mg/dL (ref 0.0–0.2)
Indirect Bilirubin: 0.2 mg/dL — ABNORMAL LOW (ref 0.3–0.9)
Total Bilirubin: 0.3 mg/dL (ref 0.0–1.2)
Total Protein: 6.2 g/dL — ABNORMAL LOW (ref 6.5–8.1)

## 2024-01-20 LAB — HEPATITIS B SURFACE ANTIGEN: Hepatitis B Surface Ag: NONREACTIVE

## 2024-01-20 NOTE — Progress Notes (Signed)
 Physical Therapy Treatment Patient Details Name: Erin [name] MRN: [id number] DOB: 31-Oct-1945 Today's Date: 01/20/2024   History of Present Illness 78 y.o. female  with PMHx significant for anxiety, asthma, stage III large B cell lymphoma in remission, diverticulosis, gastric ulcer and essential HTN, who presented to the ED with left-sided lower abdominal pain, periumbilical pain, nausea, vomiting, constipation, chest pain 8/10, and L ankle pain after fall. Per medic, pt has bed bugs in home. Imaging reveals acute nondisplaced L lateral malleolus fracture. Per ortho, LLE NWBing in splint.    PT Comments  Pt was pleasant and motivated to participate during the session and put forth good effort throughout. Pt was able to tolerate therex described below well. During STS from chair with RW, pt required physical assistance and multimodal cues to maintain NWB on LLE. As pt would begin STS, pt became very anxious and began crying out wanting to bear weight through LLE, requiring modA to prevent LLE WB, but was able to moderately calm down and maintain NWB on LLE as standing progressed. Pt tolerated ~30 seconds of standing before having to sit. Pt reported no adverse symptoms during the session other than LLE pain with SpO2 and HR WNL throughout on room air. Pt will benefit from continued PT services upon discharge to safely address deficits listed in patient problem list for decreased caregiver assistance and eventual return to PLOF.     If plan is discharge home, recommend the following: A lot of help with walking and/or transfers;A lot of help with bathing/dressing/bathroom;Assistance with feeding;Direct supervision/assist for medications management;Direct supervision/assist for financial management;Assist for transportation;Help with stairs or ramp for entrance;Supervision due to cognitive status   Can travel by private vehicle     No  Equipment Recommendations  Other (comment) (defer to next level  of care)    Recommendations for Other Services       Precautions / Restrictions Precautions Precautions: Fall Recall of Precautions/Restrictions: Impaired Precaution/Restrictions Comments: Per Charity fundraiser - BED BUGS Restrictions Weight Bearing Restrictions Per Provider Order: Yes LLE Weight Bearing Per Provider Order: Non weight bearing     Mobility  Bed Mobility               General bed mobility comments: Not assessed; pt in chair pre/post session    Transfers Overall transfer level: Needs assistance Equipment used: Rolling walker (2 wheels) Transfers: Sit to/from Stand Sit to Stand: +2 Mod assist           General transfer comment: Pt required +2 modA to complete STS from chair with RW. Pt required constant multimodal cues and modA to maintain NWB during STS    Ambulation/Gait               General Gait Details: Deferred due to pt demonstrating difficulty maintaining NWB on LLE in stance with physical assistance   Stairs             Wheelchair Mobility     Tilt Bed    Modified Rankin (Stroke Patients Only)       Balance Overall balance assessment: Needs assistance Sitting-balance support: Feet supported, No upper extremity supported Sitting balance-[name] Scale: Fair     Standing balance support: Bilateral upper extremity supported, During functional activity, Reliant on assistive device for balance Standing balance-[name] Scale: Poor Standing balance comment: Pt heavily reliant on physical assistance and RW to maintain balance in stance while maintaining NWB on LLE  Communication Communication Communication: No apparent difficulties  Cognition Arousal: Alert Behavior During Therapy: WFL for tasks assessed/performed   PT - Cognitive impairments: No apparent impairments                         Following commands: Intact      Cueing Cueing Techniques: Verbal cues, Tactile cues,  Gestural cues  Exercises Total Joint Exercises Ankle Circles/Pumps: AROM, Strengthening, Right, 10 reps Quad Sets: AROM, Strengthening, Both, 10 reps Gluteal Sets: AROM, Strengthening, Both, 10 reps Short Arc Quad: AROM, Strengthening, Both, 10 reps Hip ABduction/ADduction: AROM, Strengthening, Both, 10 reps, Other (comment) (with resistance) Straight Leg Raises: AROM, Strengthening, Both, 10 reps, Other (comment) (with resistance) Knee Flexion: AROM, Strengthening, Both, 10 reps, Other (comment) (with resistance) Other Exercises Other Exercises: Resisted knee ext x 10 BLE    General Comments        Pertinent Vitals/Pain Pain Assessment Pain Assessment: 0-10 Pain Score: 8  Pain Location: LLE Pain Descriptors / Indicators: Aching, Constant Pain Intervention(s): Monitored during session    Home Living                          Prior Function            PT Goals (current goals can now be found in the care plan section) Progress towards PT goals: Progressing toward goals    Frequency    Min 3X/week      PT Plan      Co-evaluation              AM-PAC PT 6 Clicks Mobility   Outcome Measure  Help needed turning from your back to your side while in a flat bed without using bedrails?: A Little Help needed moving from lying on your back to sitting on the side of a flat bed without using bedrails?: A Little Help needed moving to and from a bed to a chair (including a wheelchair)?: A Little Help needed standing up from a chair using your arms (e.g., wheelchair or bedside chair)?: A Lot Help needed to walk in hospital room?: Total Help needed climbing 3-5 steps with a railing? : Total 6 Click Score: 13    End of Session Equipment Utilized During Treatment: Gait belt Activity Tolerance: Patient tolerated treatment well Patient left: in chair;with call bell/phone within reach;with chair alarm set Nurse Communication: Mobility status; nursing notified of  pt being impulsive and unlikely to maintain NWB in LLE without physical assistance PT Visit Diagnosis: Other abnormalities of gait and mobility (R26.89);Difficulty in walking, not elsewhere classified (R26.2);Muscle weakness (generalized) (M62.81);Pain Pain - Right/Left: Left Pain - part of body: Ankle and joints of foot     Time: 8488-8441 PT Time Calculation (min) (ACUTE ONLY): 47 min  Charges:                            Leontine Ingles, SPT 01/20/24, 4:31 PM

## 2024-01-20 NOTE — Progress Notes (Signed)
 Date of Admission:  01/14/2024     ID: Erin [name] is a 78 y.o. female Principal Problem:   Acute colitis Active Problems:   CKD (chronic kidney disease) stage 3, GFR 30-59 ml/min (HCC)   Bilateral occipital neuralgia   Chest pain   GERD without esophagitis   Anxiety and depression   Chronic asthma    Subjective: Pt feeling better Pain abdomen much better No heartburn  Medications:   albuterol   2 puff Inhalation Q6H   calcium -vitamin D   1 tablet Oral Daily   citalopram   20 mg Oral BH-q7a   cyanocobalamin   2,500 mcg Oral Daily   diphenhydrAMINE   25 mg Oral QHS   enoxaparin  (LOVENOX ) injection  40 mg Subcutaneous Q24H   ferrous sulfate   325 mg Oral Q breakfast   gabapentin   600 mg Oral TID   metroNIDAZOLE   500 mg Oral Q12H   multivitamin with minerals  1 tablet Oral Daily   pantoprazole   40 mg Oral Daily    Objective: Vital signs in last 24 hours: Patient Vitals for the past 24 hrs:  BP Temp Temp src Pulse Resp SpO2  01/20/24 1025 (!) 96/56 98.4 F (36.9 C) Oral 90 17 (!) 81 %  01/20/24 0425 120/66 -- -- 88 14 90 %  01/19/24 2010 116/65 -- -- 92 14 96 %  01/19/24 1539 100/60 98.4 F (36.9 C) Oral 88 17 95 %     Lines and Device Date on insertion # of days DC  Port >6 yrs ago    Foley     ETT       PHYSICAL EXAM:  General: Alert, cooperative, no distress, appears stated age.  Head: Normocephalic, without obvious abnormality, atraumatic. Eyes: Conjunctivae clear, anicteric sclerae. Pupils are equal ENT Nares normal. No drainage or sinus tenderness. Lips, mucosa, and tongue normal. No Thrush Neck: Supple, symmetrical, no adenopathy, thyroid: non tender no carotid bruit and no JVD. Back: No CVA tenderness. Lungs: Clear to auscultation bilaterally. No Wheezing or Rhonchi. No rales. Heart: Regular rate and rhythm, no murmur, rub or gallop. Abdomen: Soft, non-tender,not distended. Bowel sounds normal. No masses Extremities: atraumatic, no cyanosis. No  edema. No clubbing Skin: No rashes or lesions. Or bruising Lymph: Cervical, supraclavicular normal. Neurologic: Grossly non-focal  Lab Results    Latest Ref Rng & Units 01/18/2024    5:05 AM 01/17/2024    5:52 AM 01/16/2024    6:00 AM  CBC  WBC 4.0 - 10.5 K/uL 4.9  6.5  11.2   Hemoglobin 12.0 - 15.0 g/dL 88.5  88.2  87.6   Hematocrit 36.0 - 46.0 % 35.2  35.8  37.5   Platelets 150 - 400 K/uL 130  127  135        Latest Ref Rng & Units 01/20/2024    4:32 AM 01/18/2024    5:05 AM 01/17/2024    5:52 AM  CMP  Glucose 70 - 99 mg/dL  896  93   BUN 8 - 23 mg/dL  14  17   Creatinine 9.55 - 1.00 mg/dL  8.70  8.66   Sodium 864 - 145 mmol/L  137  137   Potassium 3.5 - 5.1 mmol/L  3.9  3.7   Chloride 98 - 111 mmol/L  103  100   CO2 22 - 32 mmol/L  29  29   Calcium  8.9 - 10.3 mg/dL  9.2  8.8   Total Protein 6.5 - 8.1 g/dL 6.2  Total Bilirubin 0.0 - 1.2 mg/dL 0.3     Alkaline Phos 38 - 126 U/L 64     AST 15 - 41 U/L 34     ALT 0 - 44 U/L 23         Microbiology: 7/19 BC- bacillus and eikenella 7/22 BC- NG     Assessment/Plan: Bacillus and eikenella in the blood- identified nearly 40 hours later in a single set  makes this  likely contaminants rather than bacteria of clinical significance.. I cannot corroborate this with her clinical picture. 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

## 2024-01-20 NOTE — TOC Progression Note (Addendum)
 Transition of Care [facility]) - Progression Note    Patient Details  Name: Erin [name] MRN: [id number] Date of Birth: 1946/02/27  [facility] CM/SW Contact  Elouise LULLA Capri, RN Phone Number: 01/20/2024, 2:34 PM  Clinical Narrative:     Noted, accepting SNF, [facility]. CM call to patient's daughter, Greig, phone: [phone] accepting SNF. Per patient's daughter, Amy, verbalized agreement for patient to attend [facility]. CM call to Tammy, Admissions, [facility], phone: [phone] regarding bed availability and CM initiating auth. Per Tammy, SNF is extending bed offer and CM can initiate auth. CM alert to [employer], CMA and Rojelio RAMAN, CMA regarding initiating auth.    CM follow up in [place] [facility], PASSR is [id number] [id number], expires 02/18/2024.  CM call to [employer], phone: [phone]. CM spoke to [place] regarding BLS transport for 01/21/2024 at 1300. BLS transport scheduled.  Expected Discharge Plan: Skilled Nursing Facility Barriers to Discharge: Continued Medical Work up   Expected Discharge Plan and Services    SNF   Living arrangements for the past 2 months: Single Family Home    Social Drivers of Health (SDOH) Interventions SDOH Screenings   Food Insecurity: No Food Insecurity (01/15/2024)  Housing: Unknown (01/15/2024)  Transportation Needs: No Transportation Needs (01/15/2024)  Utilities: Not At Risk (01/15/2024)  Social Connections: Moderately Isolated (01/15/2024)  Tobacco Use: Low Risk  (01/15/2024)    Readmission Risk Interventions     No data to display

## 2024-01-20 NOTE — Plan of Care (Signed)

## 2024-01-20 NOTE — Progress Notes (Signed)
  Progress Note   Patient: Erin [name] FMW:[id number] DOB: 1946-04-04 DOA: 01/14/2024     5 DOS: the patient was seen and examined on 01/20/2024   Brief hospital course: Erin [name] is a 78 y.o. female  with medical history significant for anxiety, asthma, stage III large B cell lymphoma in remission, diverticulosis, gastric ulcer and essential hypertension, who presented to the ED because of left-sided lower abdominal pain, periumbilical pain, nausea, vomiting and constipation.  She has had constipation for over a week.  She also had chest pain that she described as pressure in sensation and was graded as 8/10 in severity.  She said she fell while going to the bathroom and developed severe pain in the left ankle.  Acute nondisplaced fracture of the left lateral malleolus on x-ray. Abdominal CT scan also shows mild colitis.  Initial blood culture was a positive, but eventually came back with EIKENELLA CORRODENS .Repeat cultures negative. Condition has improved, currently pending nursing home placement    Principal Problem:   Acute colitis Active Problems:   Chest pain   GERD without esophagitis   Anxiety and depression   CKD (chronic kidney disease) stage 3, GFR 30-59 ml/min (HCC)   Bilateral occipital neuralgia   Chronic asthma   Assessment and Plan: * Acute colitis Eikenella Bacteremia appears to be contaminant. Patient came in with diarrhea, CT scan showed evidence of acute colitis.  Patient initially covered with Rocephin  and Flagyl .  Final culture from initial blood positive for Eikenella, per ID, this is most likely contaminant. Will treat acute colitis 7 days.  Will change to oral antibiotics if discharged.   Chest pain .  Had atypical chest pain, troponin negative. Seen by cardiology, no additional workup is needed   Chronic kidney disease stage IIIb. Hyperkalemia.    renal function stable, potassium normalized     GERD without esophagitis Continue PPI    Anxiety and depression - Will continue Celexa .   Chronic asthma - Will continue her inhalers.   Bilateral occipital neuralgia - Will continue Neurontin .   Overweight BMI 28.70 Diet and exercise      Subjective:  Has Reflex last night.  Otherwise no complaint  Physical Exam: Vitals:   01/19/24 1539 01/19/24 2010 01/20/24 0425 01/20/24 1025  BP: 100/60 116/65 120/66 (!) 96/56  Pulse: 88 92 88 90  Resp: 17 14 14 17   Temp: 98.4 F (36.9 C)   98.4 F (36.9 C)  TempSrc: Oral   Oral  SpO2: 95% 96% 90% (!) 81%  Weight:      Height:       General exam: Appears calm and comfortable  Respiratory system: Clear to auscultation. Respiratory effort normal. Cardiovascular system: S1 & S2 heard, RRR. No JVD, murmurs, rubs, gallops or clicks. No pedal edema. Gastrointestinal system: Abdomen is nondistended, soft and nontender. No organomegaly or masses felt. Normal bowel sounds heard. Central nervous system: Alert and oriented. No focal neurological deficits. Extremities: Symmetric 5 x 5 power. Skin: No rashes, lesions or ulcers Psychiatry: Judgement and insight appear normal. Mood & affect appropriate.    Data Reviewed:  There are no new results to review at this time.  Family Communication: None  Disposition: Status is: Inpatient Remains inpatient appropriate because: Unsafe discharge, pending nursing home placement     Time spent: 35 minutes  Author: Murvin Mana, MD 01/20/2024 2:50 PM  For on call review [email or web address].

## 2024-01-21 DIAGNOSIS — N1832 Chronic kidney disease, stage 3b: Secondary | ICD-10-CM | POA: Diagnosis not present

## 2024-01-21 DIAGNOSIS — R0789 Other chest pain: Secondary | ICD-10-CM | POA: Diagnosis not present

## 2024-01-21 DIAGNOSIS — K529 Noninfective gastroenteritis and colitis, unspecified: Secondary | ICD-10-CM | POA: Diagnosis not present

## 2024-01-21 MED ORDER — TRAMADOL HCL 50 MG PO TABS: 50.0000 mg | ORAL_TABLET | Freq: Two times a day (BID) | ORAL | 0 refills | Status: DC | PRN

## 2024-01-21 MED ORDER — METRONIDAZOLE 500 MG PO TABS: 500.0000 mg | ORAL_TABLET | Freq: Two times a day (BID) | ORAL | Status: AC

## 2024-01-21 MED ORDER — CEPHALEXIN 500 MG PO CAPS: 500.0000 mg | ORAL_CAPSULE | Freq: Three times a day (TID) | ORAL | Status: AC

## 2024-01-21 NOTE — Discharge Summary (Signed)
 Physician Discharge Summary   Patient: Erin [name] MRN: [id number] DOB: [birth year]  Admit date:     01/14/2024  Discharge date: 01/21/24  Discharge Physician: Murvin Mana   PCP: [facility]   Recommendations at discharge:   Follow-up with PCP in 1 week.  Discharge Diagnoses: Principal Problem:   Acute colitis Active Problems:   Chest pain   GERD without esophagitis   Anxiety and depression   CKD (chronic kidney disease) stage 3, GFR 30-59 ml/min (HCC)   Bilateral occipital neuralgia   Chronic asthma   Bacteremia  Resolved Problems:   * No resolved hospital problems. *  Hospital Course: Erin [name] is a 78 y.o. female  with medical history significant for anxiety, asthma, stage III large B cell lymphoma in remission, diverticulosis, gastric ulcer and essential hypertension, who presented to the ED because of left-sided lower abdominal pain, periumbilical pain, nausea, vomiting and constipation.  She has had constipation for over a week.  She also had chest pain that she described as pressure in sensation and was graded as 8/10 in severity.  She said she fell while going to the bathroom and developed severe pain in the left ankle.  Acute nondisplaced fracture of the left lateral malleolus on x-ray. Abdominal CT scan also shows mild colitis.  Initial blood culture was a positive, but eventually came back with EIKENELLA CORRODENS .Repeat cultures negative. Condition has improved, currently pending nursing home placement .  Seen by ID, deemed positive blood culture was from contaminants.  Not a true bacteremia. Patient has been approved for nursing placement, will transfer today.   Assessment and Plan: * Acute colitis Eikenella Bacteremia appears to be contaminant. Patient came in with diarrhea, CT scan showed evidence of acute colitis.  Patient initially covered with Rocephin  and Flagyl .  Final culture from initial blood positive for Eikenella,  per ID, this is most likely contaminant. Will treat acute colitis 7 days.  Continue Keflex  and Flagyl  for additional one and 1/2 days.   Chest pain .  Had atypical chest pain, troponin negative. Seen by cardiology, no additional workup is needed   Chronic kidney disease stage IIIb. Hyperkalemia.    renal function stable, potassium normalized     GERD without esophagitis Continue PPI   Anxiety and depression - Will continue Celexa .   Chronic asthma - Will continue her inhalers.   Bilateral occipital neuralgia - Will continue Neurontin .   Overweight BMI 28.70 Diet and exercise         Consultants: ID Procedures performed: None  Disposition: Skilled nursing facility Diet recommendation:  Discharge Diet Orders (From admission, onward)     Start     Ordered   01/21/24 0000  Diet - low sodium heart healthy        01/21/24 0943           Cardiac diet DISCHARGE MEDICATION: Allergies as of 01/21/2024       Reactions   Penicillins Rash   Has patient had a PCN reaction causing immediate rash, facial/tongue/throat swelling, SOB or lightheadedness with hypotension: Yes Has patient had a PCN reaction causing severe rash involving mucus membranes or skin necrosis: No Has patient had a PCN reaction that required hospitalization: No Has patient had a PCN reaction occurring within the last 10 years: No If all of the above answers are NO, then may proceed with Cephalosporin use.   Prednisone Hives   Sulfa Antibiotics Hives, Shortness Of Breath   Enalapril  Hypotension    Medrol  [methylprednisolone ] Rash        Medication List     STOP taking these medications    docusate sodium  100 MG capsule Commonly known as: COLACE   doxycycline  100 MG capsule Commonly known as: VIBRAMYCIN    entecavir  0.5 MG tablet Commonly known as: BARACLUDE    simethicone  80 MG chewable tablet Commonly known as: Gas-X   warfarin 1 MG tablet Commonly known as: COUMADIN         TAKE these medications    acetaminophen  325 MG tablet Commonly known as: TYLENOL  Take 325-650 mg by mouth every 6 (six) hours as needed for moderate pain or headache.   albuterol  (2.5 MG/3ML) 0.083% nebulizer solution Commonly known as: PROVENTIL  Take 2.5 mg by nebulization every 6 (six) hours as needed for wheezing or shortness of breath. What changed: Another medication with the same name was changed. Make sure you understand how and when to take each.   albuterol  108 (90 Base) MCG/ACT inhaler Commonly known as: VENTOLIN  HFA Inhale 1-2 puffs into the lungs every 6 (six) hours as needed for wheezing or shortness of breath. What changed:  how much to take when to take this   alendronate  70 MG tablet Commonly known as: FOSAMAX  Take 70 mg by mouth every Friday.   aspirin  81 MG chewable tablet Chew 81 mg by mouth daily.   B-12 2500 MCG Tabs Take 2,500 mcg by mouth daily.   Biotin  5 MG Caps Take 5 mg by mouth daily.   CALCIUM  600 + D PO Take 1 tablet by mouth daily.   cephALEXin  500 MG capsule Commonly known as: KEFLEX  Take 1 capsule (500 mg total) by mouth 3 (three) times daily for 2 days.   citalopram  40 MG tablet Commonly known as: CELEXA  Take 40 mg by mouth every morning.   diphenhydrAMINE  25 MG tablet Commonly known as: BENADRYL  Take 25 mg by mouth at bedtime.   ferrous sulfate  325 (65 FE) MG tablet Commonly known as: FerrouSul Take 1 tablet (325 mg total) by mouth daily with breakfast.   furosemide  20 MG tablet Commonly known as: LASIX  Take 20 mg by mouth daily.   gabapentin  600 MG tablet Commonly known as: NEURONTIN  Take 600 mg by mouth 3 (three) times daily.   hydrocortisone 2.5 % cream Apply 1 Application topically 2 (two) times daily.   hydrOXYzine  10 MG tablet Commonly known as: ATARAX  Take 10 mg by mouth at bedtime.   lidocaine  5 % Commonly known as: LIDODERM  Place 1 patch onto the skin daily.   LUBRICANT EYE DROPS OP Place 1 drop into  both eyes daily as needed (dry eyes).   metroNIDAZOLE  500 MG tablet Commonly known as: FLAGYL  Take 1 tablet (500 mg total) by mouth 2 (two) times daily for 3 doses.   mometasone -formoterol  200-5 MCG/ACT Aero Commonly known as: Dulera  Inhale 2 puffs into the lungs 2 (two) times daily.   montelukast  10 MG tablet Commonly known as: Singulair  Take 1 tablet (10 mg total) by mouth at bedtime.   multivitamin with minerals Tabs tablet Take 1 tablet by mouth daily.   omeprazole 20 MG capsule Commonly known as: PRILOSEC Take 20 mg by mouth 2 (two) times daily.   potassium chloride  SA 20 MEQ tablet Commonly known as: KLOR-CON  M Take 1 tablet (20 mEq total) by mouth daily.   tiZANidine  2 MG tablet Commonly known as: ZANAFLEX  Take 2 mg by mouth 3 (three) times daily as needed for muscle spasms.  traMADol  50 MG tablet Commonly known as: Ultram  Take 1 tablet (50 mg total) by mouth every 12 (twelve) hours as needed. What changed: when to take this   traZODone  100 MG tablet Commonly known as: DESYREL  Take 100 mg by mouth.        Contact information for follow-up providers     Alluri, Krishna C, MD. Go in 1 week(s).   Specialty: Cardiology Contact information: [address] [place] [address] [phone]              Contact information for after-discharge care     Destination     [facility] [place], [place]. [place]   Service: Skilled Nursing Contact information: [address] [place]  [address] [phone]                    Discharge Exam: Fredricka Weights   01/15/24 2032  Weight: 73.5 kg   General exam: Appears calm and comfortable  Respiratory system: Clear to auscultation. Respiratory effort normal. Cardiovascular system: S1 & S2 heard, RRR. No JVD, murmurs, rubs, gallops or clicks. No pedal edema. Gastrointestinal system: Abdomen is nondistended, soft and nontender. No organomegaly or masses felt. Normal bowel sounds  heard. Central nervous system: Alert and oriented. No focal neurological deficits. Extremities: Symmetric 5 x 5 power. Skin: No rashes, lesions or ulcers Psychiatry: Judgement and insight appear normal. Mood & affect appropriate.    Condition at discharge: good  The results of significant diagnostics from this hospitalization (including imaging, microbiology, ancillary and laboratory) are listed below for reference.   Imaging Studies: ECHOCARDIOGRAM COMPLETE Result Date: 01/17/2024    ECHOCARDIOGRAM REPORT   Patient Name:   DEEM MARMOL Date of Exam: 01/16/2024 Medical Rec #:  [id number]        Height:       63.0 in Accession #:    [id number]       Weight:       162.0 lb Date of Birth:  01-May-1946       BSA:          1.768 m Patient Age:    77 years         BP:           106/57 mmHg Patient Gender: F                HR:           87 bpm. Exam Location:  [facility] Procedure: 2D Echo, Cardiac Doppler and Color Doppler (Both Spectral and Color            Flow Doppler were utilized during procedure). Indications:     Chest Pain R07.9  History:         Patient has no prior history of Echocardiogram examinations.                  Signs/Symptoms:Syncope.  Sonographer:     Ashley McNeely-Sloane Referring Phys:  [id number] DORENE COMFORT Diagnosing Phys: Marsa Dooms MD IMPRESSIONS  1. Left ventricular ejection fraction, by estimation, is 60 to 65%. The left ventricle has normal function. The left ventricle has no regional wall motion abnormalities. Left ventricular diastolic parameters are consistent with Grade I diastolic dysfunction (impaired relaxation).  2. Right ventricular systolic function is normal. The right ventricular size is normal.  3. The mitral valve is normal in structure. Trivial mitral valve regurgitation. No evidence of mitral stenosis.  4. The aortic valve is normal in structure. Aortic  valve regurgitation is not visualized. No aortic stenosis is present.  5. The inferior vena cava is  normal in size with greater than 50% respiratory variability, suggesting right atrial pressure of 3 mmHg. FINDINGS  Left Ventricle: Left ventricular ejection fraction, by estimation, is 60 to 65%. The left ventricle has normal function. The left ventricle has no regional wall motion abnormalities. Strain was performed and the global longitudinal strain is indeterminate. The left ventricular internal cavity size was normal in size. There is no left ventricular hypertrophy. Left ventricular diastolic parameters are consistent with Grade I diastolic dysfunction (impaired relaxation). Right Ventricle: The right ventricular size is normal. No increase in right ventricular wall thickness. Right ventricular systolic function is normal. Left Atrium: Left atrial size was normal in size. Right Atrium: Right atrial size was normal in size. Pericardium: There is no evidence of pericardial effusion. Mitral Valve: The mitral valve is normal in structure. Trivial mitral valve regurgitation. No evidence of mitral valve stenosis. MV peak gradient, 3.3 mmHg. The mean mitral valve gradient is 2.0 mmHg. Tricuspid Valve: The tricuspid valve is normal in structure. Tricuspid valve regurgitation is trivial. No evidence of tricuspid stenosis. Aortic Valve: The aortic valve is normal in structure. Aortic valve regurgitation is not visualized. No aortic stenosis is present. Aortic valve mean gradient measures 4.0 mmHg. Aortic valve peak gradient measures 8.2 mmHg. Aortic valve area, by VTI measures 2.70 cm. Pulmonic Valve: The pulmonic valve was normal in structure. Pulmonic valve regurgitation is not visualized. No evidence of pulmonic stenosis. Aorta: The aortic root is normal in size and structure. Venous: The inferior vena cava is normal in size with greater than 50% respiratory variability, suggesting right atrial pressure of 3 mmHg. IAS/Shunts: No atrial level shunt detected by color flow Doppler. Additional Comments: 3D was performed  not requiring image post processing on an independent workstation and was indeterminate.  LEFT VENTRICLE PLAX 2D LVIDd:         3.60 cm     Diastology LVIDs:         1.70 cm     LV e' medial:    7.72 cm/s LV PW:         0.90 cm     LV E/e' medial:  9.1 LV IVS:        0.70 cm     LV e' lateral:   10.30 cm/s LVOT diam:     2.00 cm     LV E/e' lateral: 6.8 LV SV:         69 LV SV Index:   39 LVOT Area:     3.14 cm  LV Volumes (MOD) LV vol d, MOD A2C: 56.4 ml LV vol d, MOD A4C: 50.9 ml LV vol s, MOD A2C: 19.9 ml LV vol s, MOD A4C: 22.1 ml LV SV MOD A2C:     36.5 ml LV SV MOD A4C:     50.9 ml LV SV MOD BP:      34.4 ml RIGHT VENTRICLE             IVC RV S prime:     11.50 cm/s  IVC diam: 1.10 cm TAPSE (M-mode): 1.5 cm LEFT ATRIUM             Index        RIGHT ATRIUM           Index LA diam:        3.10 cm 1.75 cm/m   RA Area:  12.30 cm LA Vol (A2C):   41.0 ml 23.19 ml/m  RA Volume:   29.70 ml  16.80 ml/m LA Vol (A4C):   22.6 ml 12.78 ml/m LA Biplane Vol: 30.7 ml 17.36 ml/m  AORTIC VALVE                    PULMONIC VALVE AV Area (Vmax):    2.72 cm     PV Vmax:        0.99 m/s AV Area (Vmean):   2.65 cm     PV Vmean:       66.000 cm/s AV Area (VTI):     2.70 cm     PV VTI:         0.175 m AV Vmax:           143.00 cm/s  PV Peak grad:   3.9 mmHg AV Vmean:          97.700 cm/s  PV Mean grad:   2.0 mmHg AV VTI:            0.257 m      RVOT Peak grad: 1 mmHg AV Peak Grad:      8.2 mmHg AV Mean Grad:      4.0 mmHg LVOT Vmax:         124.00 cm/s LVOT Vmean:        82.300 cm/s LVOT VTI:          0.221 m LVOT/AV VTI ratio: 0.86  AORTA Ao Root diam: 2.80 cm Ao Asc diam:  3.00 cm MITRAL VALVE MV Area (PHT): 3.53 cm    SHUNTS MV Area VTI:   3.29 cm    Systemic VTI:  0.22 m MV Peak grad:  3.3 mmHg    Systemic Diam: 2.00 cm MV Mean grad:  2.0 mmHg    Pulmonic VTI:  0.124 m MV Vmax:       0.91 m/s MV Vmean:      63.3 cm/s MV Decel Time: 215 msec MV E velocity: 70.20 cm/s MV A velocity: 98.50 cm/s MV E/A ratio:  0.71  Marsa Dooms MD Electronically signed by Marsa Dooms MD Signature Date/Time: 01/17/2024/1:23:04 PM    Final    US  Venous Img Lower Bilateral (DVT) Result Date: 01/15/2024 CLINICAL DATA:  Leg pain.  History of B-cell lymphoma EXAM: BILATERAL LOWER EXTREMITY VENOUS DOPPLER ULTRASOUND TECHNIQUE: Gray-scale sonography with graded compression, as well as color Doppler and duplex ultrasound were performed to evaluate the lower extremity deep venous systems from the level of the common femoral vein and including the common femoral, femoral, profunda femoral, popliteal and calf veins including the posterior tibial, peroneal and gastrocnemius veins when visible. The superficial great saphenous vein was also interrogated. Spectral Doppler was utilized to evaluate flow at rest and with distal augmentation maneuvers in the common femoral, femoral and popliteal veins. COMPARISON:  Ultrasound 2015.  Right lower extremity FINDINGS: RIGHT LOWER EXTREMITY Common Femoral Vein: No evidence of thrombus. Normal compressibility, respiratory phasicity and response to augmentation. Saphenofemoral Junction: No evidence of thrombus. Normal compressibility and flow on color Doppler imaging. Profunda Femoral Vein: No evidence of thrombus. Normal compressibility and flow on color Doppler imaging. Femoral Vein: No evidence of thrombus. Normal compressibility, respiratory phasicity and response to augmentation. Popliteal Vein: No evidence of thrombus. Normal compressibility, respiratory phasicity and response to augmentation. Calf Veins: No evidence of thrombus. Normal compressibility and flow on color Doppler imaging. Superficial Great Saphenous Vein: No evidence of thrombus.  Normal compressibility. Venous Reflux:  None. Other Findings: Mildly complex cystic focus in the popliteal fossa measuring 3.0 x 0.8 x 1.8 cm. LEFT LOWER EXTREMITY Common Femoral Vein: No evidence of thrombus. Normal compressibility, respiratory phasicity  and response to augmentation. Saphenofemoral Junction: No evidence of thrombus. Normal compressibility and flow on color Doppler imaging. Profunda Femoral Vein: No evidence of thrombus. Normal compressibility and flow on color Doppler imaging. Femoral Vein: No evidence of thrombus. Normal compressibility, respiratory phasicity and response to augmentation. Popliteal Vein: No evidence of thrombus. Normal compressibility, respiratory phasicity and response to augmentation. Calf Veins: No evidence of thrombus. Normal compressibility and flow on color Doppler imaging. Superficial Great Saphenous Vein: No evidence of thrombus. Normal compressibility. Venous Reflux:  None. Other Findings:  None. IMPRESSION: No evidence of bilateral lower extremity DVT. Possible right popliteal fossa Baker's cyst. There is a differential. Please correlate for known history and dedicated confirmatory evaluation as clinically appropriate. Electronically Signed   By: Ranell Bring M.D.   On: 01/15/2024 12:04   CT ABDOMEN PELVIS W CONTRAST Result Date: 01/15/2024 CLINICAL DATA:  Abdominal pain EXAM: CT ABDOMEN AND PELVIS WITH CONTRAST TECHNIQUE: Multidetector CT imaging of the abdomen and pelvis was performed using the standard protocol following bolus administration of intravenous contrast. RADIATION DOSE REDUCTION: This exam was performed according to the departmental dose-optimization program which includes automated exposure control, adjustment of the mA and/or kV according to patient size and/or use of iterative reconstruction technique. CONTRAST:  80mL OMNIPAQUE  IOHEXOL  300 MG/ML  SOLN COMPARISON:  11/06/2022 FINDINGS: Lower Chest: Normal. Hepatobiliary: Normal hepatic contours. No intra- or extrahepatic biliary dilatation. Status post cholecystectomy. Pancreas: Normal pancreas. No ductal dilatation or peripancreatic fluid collection. Spleen: Normal. Adrenals/Urinary Tract: The adrenal glands are normal. No hydronephrosis,  nephroureterolithiasis or solid renal mass. The urinary bladder is normal for degree of distention Stomach/Bowel: There is no hiatal hernia. Normal duodenal course and caliber. No small bowel dilatation or inflammation. There is mild inflammatory change adjacent to the distal descending colon. Normal appendix. Vascular/Lymphatic: There is calcific atherosclerosis of the abdominal aorta. No lymphadenopathy. Reproductive: Status post hysterectomy. No adnexal mass. Other: None. Musculoskeletal: Grade 1 anterolisthesis at L4-5. IMPRESSION: Mild inflammatory change adjacent to the distal descending colon, which may indicate mild colitis. Electronically Signed   By: Franky Stanford M.D.   On: 01/15/2024 01:47   DG Chest Port 1 View Result Date: 01/14/2024 CLINICAL DATA:  Status post fall. EXAM: PORTABLE CHEST 1 VIEW COMPARISON:  January 10, 2024 FINDINGS: There is stable right-sided venous Port-A-Cath positioning. The heart size and mediastinal contours are within normal limits. Both lungs are clear. Multiple chronic bilateral rib fractures are seen. Multilevel degenerative changes are present throughout the thoracic spine. IMPRESSION: No active cardiopulmonary disease. Electronically Signed   By: Suzen Dials M.D.   On: 01/14/2024 23:38   DG Ankle Complete Left Result Date: 01/14/2024 CLINICAL DATA:  Status post fall. EXAM: LEFT ANKLE COMPLETE - 3+ VIEW COMPARISON:  None Available. FINDINGS: Acute nondisplaced fracture deformity is seen involving the left lateral malleolus. There is no evidence of dislocation. An additional fracture deformity of indeterminate age is seen involving the distal aspect of the fifth left metatarsal. Mild anterior and lateral soft tissue swelling is noted. IMPRESSION: Acute nondisplaced fracture of the left lateral malleolus. Electronically Signed   By: Suzen Dials M.D.   On: 01/14/2024 23:37   DG Chest 2 View Result Date: 01/10/2024 CLINICAL DATA:  dyspnea EXAM: CHEST - 2 VIEW  COMPARISON:  May 21, 2023 FINDINGS: Similarly positioned right chest port, terminating at the cavoatrial junction. Streaky left basilar atelectasis. No focal airspace consolidation, pleural effusion, or pneumothorax. No cardiomegaly. No acute fracture or destructive lesions. Multilevel thoracic osteophytosis. Bilateral glenohumeral joint osteoarthritis. IMPRESSION: No acute cardiopulmonary abnormality. Electronically Signed   By: Rogelia Myers M.D.   On: 01/10/2024 12:10    Microbiology: Results for orders placed or performed during the hospital encounter of 01/14/24  Blood culture (routine single)     Status: Abnormal   Collection Time: 01/14/24 11:18 PM   Specimen: BLOOD  Result Value Ref Range Status   Specimen Description   Final    BLOOD RIGHT ARM Performed at [facility], [address] [place], [place] [address]    Special Requests   Final    BOTTLES DRAWN AEROBIC AND ANAEROBIC Blood Culture adequate volume Performed at [facility], [address], [place], [place] [address]    Culture  Setup Time (A)  Final    GRAM VARIABLE ROD AEROBIC BOTTLE ONLY Organism ID to follow CORRECTED RESULTS PREVIOUSLY REPORTED AS: GRAM NEGATIVE RODS CORRECTED RESULTS CALLED TO: A. CHAPPELL PHARMD, AT 1133 01/18/24 D. VANHOOK    Culture (A)  Final    EIKENELLA CORRODENS Usually susceptible to penicillin and other beta lactam agents,quinolones,macrolides and tetracyclines. BACILLUS SPECIES Standardized susceptibility testing for this organism is not available. Performed at [facility], [address] [place], [place] [address]    Report Status 01/18/2024 FINAL  Final  Blood Culture ID Panel (Reflexed)     Status: None   Collection Time: 01/14/24 11:18 PM  Result Value Ref Range Status   Enterococcus faecalis NOT DETECTED NOT DETECTED Final   Enterococcus Faecium NOT DETECTED NOT DETECTED Final   Listeria monocytogenes NOT DETECTED NOT DETECTED Final    Staphylococcus species NOT DETECTED NOT DETECTED Final   Staphylococcus aureus (BCID) NOT DETECTED NOT DETECTED Final   Staphylococcus epidermidis NOT DETECTED NOT DETECTED Final   Staphylococcus lugdunensis NOT DETECTED NOT DETECTED Final   Streptococcus species NOT DETECTED NOT DETECTED Final   Streptococcus agalactiae NOT DETECTED NOT DETECTED Final   Streptococcus pneumoniae NOT DETECTED NOT DETECTED Final   Streptococcus pyogenes NOT DETECTED NOT DETECTED Final   A.calcoaceticus-baumannii NOT DETECTED NOT DETECTED Final   Bacteroides fragilis NOT DETECTED NOT DETECTED Final   Enterobacterales NOT DETECTED NOT DETECTED Final   Enterobacter cloacae complex NOT DETECTED NOT DETECTED Final   Escherichia coli NOT DETECTED NOT DETECTED Final   Klebsiella aerogenes NOT DETECTED NOT DETECTED Final   Klebsiella oxytoca NOT DETECTED NOT DETECTED Final   Klebsiella pneumoniae NOT DETECTED NOT DETECTED Final   Proteus species NOT DETECTED NOT DETECTED Final   Salmonella species NOT DETECTED NOT DETECTED Final   Serratia marcescens NOT DETECTED NOT DETECTED Final   Haemophilus influenzae NOT DETECTED NOT DETECTED Final   Neisseria meningitidis NOT DETECTED NOT DETECTED Final   Pseudomonas aeruginosa NOT DETECTED NOT DETECTED Final   Stenotrophomonas maltophilia NOT DETECTED NOT DETECTED Final   Candida albicans NOT DETECTED NOT DETECTED Final   Candida auris NOT DETECTED NOT DETECTED Final   Candida glabrata NOT DETECTED NOT DETECTED Final   Candida krusei NOT DETECTED NOT DETECTED Final   Candida parapsilosis NOT DETECTED NOT DETECTED Final   Candida tropicalis NOT DETECTED NOT DETECTED Final   Cryptococcus neoformans/gattii NOT DETECTED NOT DETECTED Final    Comment: Performed at [facility], [address] [place], [place] [address]  Culture, blood (Routine X 2) w Reflex to ID Panel     Status: None (Preliminary result)   Collection Time: 01/17/24  5:52 AM   Specimen: BLOOD   Result Value Ref Range Status   Specimen Description BLOOD BLOOD LEFT ARM  Final   Special Requests   Final    BOTTLES DRAWN AEROBIC AND ANAEROBIC Blood Culture adequate volume   Culture   Final    NO GROWTH 4 DAYS Performed at [facility], [address] [place], [place] [address]    Report Status PENDING  Incomplete  Culture, blood (Routine X 2) w Reflex to ID Panel     Status: None (Preliminary result)   Collection Time: 01/17/24  5:52 AM   Specimen: BLOOD  Result Value Ref Range Status   Specimen Description BLOOD BLOOD RIGHT HAND  Final   Special Requests   Final    BOTTLES DRAWN AEROBIC AND ANAEROBIC Blood Culture adequate volume   Culture   Final    NO GROWTH 4 DAYS Performed at [facility], [address] [place], [place] [address]    Report Status PENDING  Incomplete    Labs: CBC: Recent Labs  Lab 01/15/24 0204 01/15/24 0430 01/16/24 0600 01/17/24 0552 01/18/24 0505  WBC 8.7 10.5 11.2* 6.5 4.9  NEUTROABS 6.7  --   --   --   --   HGB 14.0 13.8 12.3 11.7* 11.4*  HCT 42.5 41.3 37.5 35.8* 35.2*  MCV 93.4 93.7 94.9 94.7 94.4  PLT 157 155 135* 127* 130*   Basic Metabolic Panel: Recent Labs  Lab 01/15/24 0028 01/15/24 0430 01/16/24 0600 01/17/24 0552 01/18/24 0505  NA 141 139 136 137 137  K 3.3* 3.2* 3.5 3.7 3.9  CL 104 107 102 100 103  CO2 27 26 26 29 29   GLUCOSE 116* 133* 112* 93 103*  BUN 16 18 17 17 14   CREATININE 1.44* 1.54* 1.33* 1.33* 1.29*  CALCIUM  9.3 8.7* 8.5* 8.8* 9.2  MG  --  2.4  --   --  2.7*   Liver Function Tests: Recent Labs  Lab 01/15/24 0028 01/20/24 0432  AST 23 34  ALT 14 23  ALKPHOS 66 64  BILITOT 0.5 0.3  PROT 7.3 6.2*  ALBUMIN 3.6 2.9*   CBG: No results for input(s): GLUCAP in the last 168 hours.  Discharge time spent: greater than 30 minutes.  Signed: Murvin Mana, MD [facility] 01/21/2024

## 2024-01-21 NOTE — TOC Progression Note (Addendum)
 Transition of Care [facility]) - Progression Note    Patient Details  Name: Erin [name] MRN: [id number] Date of Birth: [birth year]  [facility] CM/SW Contact  Delphine KANDICE Bring, RN Phone Number: 01/21/2024, 1:26 PM  Clinical Narrative:    Insurance shara [id number]  7/25-7/29/25  PASSR #[id number] E  exp 02/18/24  Weekend CSW change transportation time to 3pm because facility can take her after 1pm.   Tammy from [facility] gave CM the number for nurse to call report [phone] She will be going to room 806. CM gave information to her nurse.   Expected Discharge Plan: Skilled Nursing Facility Barriers to Discharge: No Barriers Identified               Expected Discharge Plan and Services       Living arrangements for the past 2 months: Single Family Home Expected Discharge Date: 01/21/24                                     Social Drivers of Health (SDOH) Interventions SDOH Screenings   Food Insecurity: No Food Insecurity (01/15/2024)  Housing: Unknown (01/15/2024)  Transportation Needs: No Transportation Needs (01/15/2024)  Utilities: Not At Risk (01/15/2024)  Social Connections: Moderately Isolated (01/15/2024)  Tobacco Use: Low Risk  (01/15/2024)    Readmission Risk Interventions     No data to display

## 2024-01-21 NOTE — Plan of Care (Signed)

## 2024-01-21 NOTE — TOC Progression Note (Signed)
 Transition of Care [facility]) - Progression Note    Patient Details  Name: Erin [name] MRN: [id number] Date of Birth: 10-Jun-1946  [facility]) CM/SW Contact  Lorraine LILLETTE Fenton, [place] Phone Number: 01/21/2024, 12:15 PM  Clinical Narrative:     Leonard from RN team that DC order signed and will pt leave today.  CSW read not- was not clear in Auth approved- but transportation pre-scheduled for 1300 today.  CSW will Call Tammy from [facility]  and advise.    Expected Discharge Plan: Skilled Nursing Facility Barriers to Discharge: No Barriers Identified               Expected Discharge Plan and Services       Living arrangements for the past 2 months: Single Family Home Expected Discharge Date: 01/21/24                                     Social Drivers of Health (SDOH) Interventions SDOH Screenings   Food Insecurity: No Food Insecurity (01/15/2024)  Housing: Unknown (01/15/2024)  Transportation Needs: No Transportation Needs (01/15/2024)  Utilities: Not At Risk (01/15/2024)  Social Connections: Moderately Isolated (01/15/2024)  Tobacco Use: Low Risk  (01/15/2024)    Readmission Risk Interventions     No data to display

## 2024-01-22 LAB — CULTURE, BLOOD (ROUTINE X 2)
Culture: NO GROWTH
Culture: NO GROWTH
Special Requests: ADEQUATE
Special Requests: ADEQUATE

## 2024-02-01 ENCOUNTER — Other Ambulatory Visit: Payer: Self-pay | Admitting: Cardiology

## 2024-02-01 DIAGNOSIS — R0789 Other chest pain: Secondary | ICD-10-CM

## 2024-02-10 ENCOUNTER — Ambulatory Visit: Admission: RE | Admit: 2024-02-10 | Source: Ambulatory Visit

## 2024-02-20 ENCOUNTER — Telehealth: Payer: Self-pay | Admitting: *Deleted

## 2024-02-20 NOTE — Telephone Encounter (Signed)
 The PT [facility] wants to get approval for rehab and going back home. I spoke to RN for [facility] and they did not do anything for [place]  for the hospitalization and therefore she should call the carlin Blamer to get the approval

## 2024-03-02 ENCOUNTER — Encounter: Admission: RE | Admit: 2024-03-02 | Source: Ambulatory Visit

## 2024-03-08 NOTE — Progress Notes (Signed)
 Chief Complaint Chief Complaint  Patient presents with  . Left Ankle - Pain    Reason for Visit The patient is a 78 year old female who presented for reevaluation of her left ankle.  The patient has been treated for a left distal fibula nondisplaced fracture with an ankle brace.  She is using a walker today.  The patient sustained a left ankle fracture on January 14, 2024.  The patient has been in an air splint for protection.  She has been cautious with activities.  She is in a nursing home and doing physical therapy for ambulation.  The patient has been careful with activities.  She has continued swelling and bruising.  She still has some pain, but is improving.  She has no numbness or tingling.  The patient is not a diabetic.  She is complaining of headaches from hitting her head when she fell.  She has not seen her family doctor since her injury.   Medications Current Outpatient Medications  Medication Sig Dispense Refill  . acetaminophen  (TYLENOL ) 325 MG tablet Take 650 mg by mouth every 4 (four) hours as needed for Pain    . albuterol  (PROVENTIL ) 2.5 mg /3 mL (0.083 %) nebulizer solution     . albuterol  90 mcg/actuation inhaler Inhale 2 inhalations into the lungs every 4 (four) hours as needed. 1 Inhaler 1  . alendronate  (FOSAMAX ) 70 MG tablet     . aspirin  81 MG chewable tablet Take 81 mg by mouth once daily    . CALCIUM  CARBONATE/VITAMIN D3 (CALCIUM +D ORAL) Take 1 tablet by mouth daily.    . citalopram  (CELEXA ) 20 MG tablet Take 1 tablet by mouth once daily (Patient not taking: Reported on 02/09/2024)  0  . citalopram  (CELEXA ) 40 MG tablet     . CYANOCOBALAMIN , VITAMIN B-12, (VITAMIN B-12 ORAL) Take 500 mcg by mouth daily.    . DULERA  200-5 mcg/actuation inhaler     . enalapril (VASOTEC) 2.5 MG tablet Take 2.5 mg by mouth once daily (Patient not taking: Reported on 02/09/2024)    . entecavir  (BARACLUDE ) 0.5 MG tablet TAKE 1 TABLET BY MOUTH DAILY (Patient not taking: Reported on 02/09/2024)  90 tablet 0  . FUROsemide  (LASIX ) 20 MG tablet Take 20 mg by mouth once daily    . gabapentin  (NEURONTIN ) 300 MG capsule Take 1 capsule by mouth nightly. (Patient not taking: Reported on 02/09/2024)    . gabapentin  (NEURONTIN ) 600 MG tablet     . hydrocortisone 2.5 % cream Apply 1 Application topically 2 (two) times daily    . hydrOXYzine  HCL (ATARAX ) 10 MG tablet Take 10 mg by mouth at bedtime    . ipratropium (ATROVENT  HFA) inhaler Inhale into the lungs.    . lidocaine  (LIDODERM ) 5 % patch Place 1 patch onto the skin once daily    . LORazepam  (ATIVAN ) 0.5 MG tablet Take 0.5 mg by mouth every 8 (eight) hours as needed for Anxiety. Reported on 08/22/2015     . mirtazapine  (REMERON ) 15 MG tablet Take 15 mg by mouth nightly. Reported on 06/09/2015  (Patient not taking: Reported on 02/09/2024)    . multivitamin tablet Take 2 tablets by mouth daily. Alive Women's Gummy Vitamin    . omeprazole (PRILOSEC) 20 MG DR capsule Take 20 mg by mouth once daily    . PARoxetine (PAXIL) 10 MG tablet  (Patient not taking: Reported on 02/09/2024)    . tiZANidine  (ZANAFLEX ) 2 MG tablet Take 2 mg by mouth every 8 (eight) hours  as needed. Reported on 08/22/2015   0  . traMADoL  (ULTRAM ) 50 mg tablet Take 50 mg by mouth every 12 (twelve) hours as needed for Pain (Patient not taking: Reported on 02/09/2024)    . traZODone  (DESYREL ) 100 MG tablet Take 100 mg by mouth at bedtime    . warfarin (COUMADIN ) 1 MG tablet Take 1 mg by mouth once daily. (Patient not taking: Reported on 02/09/2024)     No current facility-administered medications for this visit.    Allergies Penicillins, Prednisone, Sulfa (sulfonamide antibiotics), and Enalapril  Histories Past Medical History: Past Medical History:  Diagnosis Date  . Anxiety   . ASTHMA   . Cholelithiasis 2009  . Diverticulosis   . DVT of upper extremity (deep vein thrombosis) (CMS/HHS-HCC) February 2010   Right upper extremity and neck  . DVT of upper extremity (deep  vein thrombosis), left (CMS/HHS-HCC) 01/26/08  . Hx of gastric ulcer May 2009    EGD  . Hypertension   . Liver disease 1997   Hepatitis B  . Lower extremity edema   . Lymphoma (CMS/HHS-HCC) May 2009   Stage III large B cell lymphoma    Past Surgical History: Past Surgical History:  Procedure Laterality Date  . LIVER BIOPSY  01/27/1996   chronic active hepatitis, mild fibrosis of the portal triads, no bridging fibrosis.  . CHOLECYSTECTOMY  02/2009  . COLONOSCOPY  03/31/2018   Polyp not retrieved: [facility] 03/2023  . COLONOSCOPY  11/23/2007, 05/12/1994   Int Hemorrhoids, Diverticulosis: [facility] 10/2017; Recall Ltr mailed 09/20/2017 (dh)  . EGD  09/03/2009, 10/14/2008, 09/30/2008, 11/23/2007  . HEMORRHOIDECTOMY BY SIMPLE LIGATION    . PORTOCAVAL SHUNT PLACEMENT     and removal    Social History: Social History   Socioeconomic History  . Marital status: Legally Separated  Tobacco Use  . Smoking status: Never  . Smokeless tobacco: Never  Substance and Sexual Activity  . Alcohol use: No  . Sexual activity: Defer    Partners: Male    Birth control/protection: Abstinence   Social Drivers of Health   Financial Resource Strain: Low Risk  (02/09/2024)   Overall Financial Resource Strain (CARDIA)   . Difficulty of Paying Living Expenses: Not hard at all  Food Insecurity: No Food Insecurity (02/09/2024)   Hunger Vital Sign   . Worried About Programme researcher, broadcasting/film/video in the Last Year: Never true   . Ran Out of Food in the Last Year: Never true  Transportation Needs: No Transportation Needs (02/09/2024)   PRAPARE - Transportation   . Lack of Transportation (Medical): No   . Lack of Transportation (Non-Medical): No  Social Connections: Moderately Isolated (01/15/2024)   Received from [facility]   Social Connection and Isolation Panel   . In a typical week, how many times do you talk on the phone with family, friends, or neighbors?: Twice a week   . How often do you get together with friends or  relatives?: Twice a week   . How often do you attend church or religious services?: 1 to 4 times per year   . Do you belong to any clubs or organizations such as church groups, unions, fraternal or athletic groups, or school groups?: No   . How often do you attend meetings of the clubs or organizations you belong to?: Never   . Are you married, widowed, divorced, separated, never married, or living with a partner?: Divorced  Housing Stability: Low Risk  (02/09/2024)   Housing Stability Vital  Sign   . Unable to Pay for Housing in the Last Year: No   . Number of Times Moved in the Last Year: 0   . Homeless in the Last Year: No    Family History: Family History  Problem Relation Name Age of Onset  . Stroke Mother    . Bone cancer Father    . Cirrhosis Neg Hx    . Colon cancer Neg Hx    . Liver cancer Neg Hx    . Liver disease Neg Hx      Review of Systems A comprehensive 14 point ROS was performed, reviewed, and the pertinent orthopaedic findings are documented in the HPI.  Exam BP 124/72   Ht 160 cm (5' 3)   Wt 73.9 kg (163 lb)   BMI 28.87 kg/m    General/Constitutional: NAD, conversant Eyes: Pupils equal and round, extraocular movements intact ENT: atraumatic external nose and ears, moist mucous membranes Respiratory: non-labored breathing, symmetric chest rise, chest sounds clear. Cardiovascular: no visible lower extremity edema, peripheral pulses present, regular rate and rhythm  Skin: normal skin turgor, warm and dry Neurological: cranial nerves grossly intact, sensation grossly intact Psychological:  Appropriate mood and affect; appropriate judgment Musculoskeletal: as detailed below:  General: Well developed, well nourished 78 y.o. female in no apparent distress.  Normal affect.  Normal communication.  Patient answers questions appropriately.  The patient is ambulating without a brace or boot.  She is using her walker for ambulation.  Left lower  Extremity: Examination of the left lower extremity reveals no bony abnormality, positive edema, and lateral ecchymosis.  The patient is minimally tender along the distal fibula to palpation.  She has no medial discomfort.  Her ankle range of motion is full and intact with plantarflexion and dorsiflexion.  Inversion and eversion were improved with testing.  There is good ankle stability.  There is no fracture motion noted.  She has neurovascular that is intact.  Radiology: Xrays of the left ankle were ordered and interpreted 03/08/2024, with 3 views using AP, mortise view, lateral views.  Xrays revealed the left ankle with a distal fibula oblique fracture with no ankle dislocation or dissociation.  The ankle mortise is well-maintained.  There is increased callus formation with good fracture healing noted.     Impression Encounter Diagnosis  Name Primary?  . Traumatic closed nondisplaced fracture of distal fibula, left, initial encounter Yes    Plan   1.  I discussed the physical exam finding as well as the x-ray results and previous treatment.  I discussed the nature of her ankle fracture. 2.  The patient will continue her ankle brace if needed. 3.  She will continue physical therapy with weightbearing as tolerated. 4.  The patient will follow-up on an as-needed basis.  She will follow-up with her primary care provider in regards to her headaches where she may have hit the back of her head.  This note was generated in part with voice recognition software and I apologize for any typographical errors that were not detected and corrected.  Review of the [place]  CSRS was performed in accordance with the [facility] [facility] prior to dispensing any controlled substances.   Dorn Krystal Doyne PA-C, [email or web address]

## 2024-04-17 ENCOUNTER — Telehealth: Payer: Self-pay | Admitting: Internal Medicine

## 2024-04-17 ENCOUNTER — Other Ambulatory Visit: Payer: Self-pay | Admitting: *Deleted

## 2024-04-17 DIAGNOSIS — C8253 Diffuse follicle center lymphoma, intra-abdominal lymph nodes: Secondary | ICD-10-CM

## 2024-04-17 NOTE — Telephone Encounter (Signed)
 Amy Pt daughter calling and wants to make an port flush appt and she also states the pt has been complaining of a knot on her right jaw line by her neck and she would like to see a provider for that issue as well. Please advise. [phone]

## 2024-04-24 ENCOUNTER — Inpatient Hospital Stay: Admitting: Nurse Practitioner

## 2024-04-24 ENCOUNTER — Encounter: Payer: Self-pay | Admitting: Nurse Practitioner

## 2024-04-24 ENCOUNTER — Inpatient Hospital Stay: Attending: Internal Medicine

## 2024-04-24 VITALS — BP 121/74 | HR 84 | Temp 97.4°F | Resp 16 | Ht 63.0 in | Wt 161.4 lb

## 2024-04-24 DIAGNOSIS — N183 Chronic kidney disease, stage 3 unspecified: Secondary | ICD-10-CM

## 2024-04-24 DIAGNOSIS — R61 Generalized hyperhidrosis: Secondary | ICD-10-CM | POA: Insufficient documentation

## 2024-04-24 DIAGNOSIS — D649 Anemia, unspecified: Secondary | ICD-10-CM

## 2024-04-24 DIAGNOSIS — R221 Localized swelling, mass and lump, neck: Secondary | ICD-10-CM

## 2024-04-24 DIAGNOSIS — C8253 Diffuse follicle center lymphoma, intra-abdominal lymph nodes: Secondary | ICD-10-CM

## 2024-04-24 DIAGNOSIS — M542 Cervicalgia: Secondary | ICD-10-CM | POA: Diagnosis not present

## 2024-04-24 DIAGNOSIS — D631 Anemia in chronic kidney disease: Secondary | ICD-10-CM | POA: Insufficient documentation

## 2024-04-24 DIAGNOSIS — C833A Diffuse large b-cell lymphoma, in remission: Secondary | ICD-10-CM | POA: Diagnosis present

## 2024-04-24 DIAGNOSIS — Z9221 Personal history of antineoplastic chemotherapy: Secondary | ICD-10-CM | POA: Insufficient documentation

## 2024-04-24 DIAGNOSIS — Z923 Personal history of irradiation: Secondary | ICD-10-CM | POA: Diagnosis not present

## 2024-04-24 DIAGNOSIS — M549 Dorsalgia, unspecified: Secondary | ICD-10-CM

## 2024-04-24 LAB — CBC WITH DIFFERENTIAL (CANCER CENTER ONLY)
Abs Immature Granulocytes: 0.02 K/uL (ref 0.00–0.07)
Basophils Absolute: 0.1 K/uL (ref 0.0–0.1)
Basophils Relative: 1 %
Eosinophils Absolute: 0.1 K/uL (ref 0.0–0.5)
Eosinophils Relative: 2 %
HCT: 41.8 % (ref 36.0–46.0)
Hemoglobin: 13.7 g/dL (ref 12.0–15.0)
Immature Granulocytes: 0 %
Lymphocytes Relative: 28 %
Lymphs Abs: 1.7 K/uL (ref 0.7–4.0)
MCH: 30.6 pg (ref 26.0–34.0)
MCHC: 32.8 g/dL (ref 30.0–36.0)
MCV: 93.5 fL (ref 80.0–100.0)
Monocytes Absolute: 0.6 K/uL (ref 0.1–1.0)
Monocytes Relative: 10 %
Neutro Abs: 3.6 K/uL (ref 1.7–7.7)
Neutrophils Relative %: 59 %
Platelet Count: 191 K/uL (ref 150–400)
RBC: 4.47 MIL/uL (ref 3.87–5.11)
RDW: 13 % (ref 11.5–15.5)
WBC Count: 6.1 K/uL (ref 4.0–10.5)
nRBC: 0 % (ref 0.0–0.2)

## 2024-04-24 LAB — CMP (CANCER CENTER ONLY)
ALT: 12 U/L (ref 0–44)
AST: 19 U/L (ref 15–41)
Albumin: 3.6 g/dL (ref 3.5–5.0)
Alkaline Phosphatase: 90 U/L (ref 38–126)
Anion gap: 8 (ref 5–15)
BUN: 26 mg/dL — ABNORMAL HIGH (ref 8–23)
CO2: 27 mmol/L (ref 22–32)
Calcium: 9 mg/dL (ref 8.9–10.3)
Chloride: 101 mmol/L (ref 98–111)
Creatinine: 1.53 mg/dL — ABNORMAL HIGH (ref 0.44–1.00)
GFR, Estimated: 35 mL/min — ABNORMAL LOW (ref >60)
Glucose, Bld: 92 mg/dL (ref 70–99)
Potassium: 4.3 mmol/L (ref 3.5–5.1)
Sodium: 136 mmol/L (ref 135–145)
Total Bilirubin: 0.5 mg/dL (ref 0.0–1.2)
Total Protein: 7.1 g/dL (ref 6.5–8.1)

## 2024-04-24 LAB — LACTATE DEHYDROGENASE: LDH: 119 U/L (ref 98–192)

## 2024-04-24 NOTE — Progress Notes (Signed)
 C/o nodule behind rt ear, getting bigger x2 months, painful to touch 6/10.
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
## 2024-04-27 ENCOUNTER — Ambulatory Visit
Admission: RE | Admit: 2024-04-27 | Discharge: 2024-04-27 | Disposition: A | Discharge status: Home / Self Care | Source: Ambulatory Visit | Attending: Nurse Practitioner | Admitting: Nurse Practitioner

## 2024-04-27 DIAGNOSIS — C8253 Diffuse follicle center lymphoma, intra-abdominal lymph nodes: Secondary | ICD-10-CM | POA: Diagnosis present

## 2024-04-30 ENCOUNTER — Ambulatory Visit: Payer: Self-pay | Admitting: Internal Medicine

## 2024-04-30 NOTE — Progress Notes (Signed)
 Please order lymph node biopsy ASAP-core biopsy with IR- GB

## 2024-05-01 ENCOUNTER — Other Ambulatory Visit: Payer: Self-pay | Admitting: Nurse Practitioner

## 2024-05-01 DIAGNOSIS — Z8572 Personal history of non-Hodgkin lymphomas: Secondary | ICD-10-CM

## 2024-05-01 DIAGNOSIS — R590 Localized enlarged lymph nodes: Secondary | ICD-10-CM

## 2024-05-01 NOTE — Progress Notes (Signed)
 Spoke to patient by phone. Reviewed results of recent CT Scan which reveals lymphadenopathy. Biopsy recommended. Recommend to be done in IR. At patient request I reached out to patient's daughter. Sent message to Clarita and will update nursing to coordinate appointments. Also assisted with setting up mychart account.

## 2024-05-02 NOTE — Progress Notes (Signed)
 Jenna Cordella LABOR, MD sent to Carlie Hoose S PROCEDURE / BIOPSY REVIEW Date: 05/01/24  Requested Biopsy site: right neck Reason for request: RT soft tissue mass / Lymphadenopathy Imaging review: Best seen on CT  Decision: Approved Imaging modality to perform: Ultrasound Schedule with: No sedation / Local anesthetic Schedule for: Any VIR  Additional comments: @Schedulers .  Please contact me with questions, concerns, or if issue pertaining to this request arise.  Cordella LABOR Jenna, MD [facility]

## 2024-05-07 ENCOUNTER — Inpatient Hospital Stay: Admitting: Internal Medicine

## 2024-05-09 NOTE — Progress Notes (Signed)
 Patient for US  guided Core RT soft tissue mass biopsy on Thurs 05/10/24, I called and spoke with the patient on the phone and gave pre-procedure instructions. Pt was made aware to be here at 1p and check in at the [facility] registration desk. Pt stated understanding.  Called   05/09/24

## 2024-05-10 ENCOUNTER — Other Ambulatory Visit: Payer: Self-pay | Admitting: Nurse Practitioner

## 2024-05-10 ENCOUNTER — Ambulatory Visit
Admission: RE | Admit: 2024-05-10 | Discharge: 2024-05-10 | Disposition: A | Discharge status: Home / Self Care | Source: Ambulatory Visit | Attending: Nurse Practitioner | Admitting: Nurse Practitioner

## 2024-05-10 DIAGNOSIS — Z8572 Personal history of non-Hodgkin lymphomas: Secondary | ICD-10-CM

## 2024-05-10 DIAGNOSIS — R590 Localized enlarged lymph nodes: Secondary | ICD-10-CM | POA: Diagnosis present

## 2024-05-10 DIAGNOSIS — C8511 Unspecified B-cell lymphoma, lymph nodes of head, face, and neck: Secondary | ICD-10-CM | POA: Diagnosis not present

## 2024-05-10 DIAGNOSIS — I82621 Acute embolism and thrombosis of deep veins of right upper extremity: Secondary | ICD-10-CM

## 2024-05-10 MED ORDER — LIDOCAINE HCL (PF) 1 % IJ SOLN
5.0000 mL | Freq: Once | INTRAMUSCULAR | Status: AC
  Administered 2024-05-10: 5 mL via INTRADERMAL

## 2024-05-10 NOTE — Procedures (Signed)
 Interventional Radiology Procedure Note  Procedure: US  Guided Biopsy of right neck lymph node mass  Complications: None  Estimated Blood Loss: < 10 mL  Findings: 18 G core biopsy of right cervical lymph node mass performed under US  guidance.  Five core samples obtained and sent to Pathology.  Erin [name]. [place], M.D Pager:  [phone]

## 2024-05-11 ENCOUNTER — Other Ambulatory Visit: Payer: Self-pay | Admitting: Internal Medicine

## 2024-05-11 ENCOUNTER — Ambulatory Visit
Admission: RE | Admit: 2024-05-11 | Discharge: 2024-05-11 | Disposition: A | Discharge status: Home / Self Care | Source: Ambulatory Visit | Attending: Nurse Practitioner | Admitting: Nurse Practitioner

## 2024-05-11 ENCOUNTER — Telehealth: Payer: Self-pay

## 2024-05-11 DIAGNOSIS — I82621 Acute embolism and thrombosis of deep veins of right upper extremity: Secondary | ICD-10-CM | POA: Diagnosis present

## 2024-05-11 MED ORDER — APIXABAN (ELIQUIS) VTE STARTER PACK (10MG AND 5MG)
ORAL_TABLET | ORAL | 0 refills | Status: AC
Start: 2024-05-11 — End: ?

## 2024-05-11 NOTE — Telephone Encounter (Signed)
 [facility] US  with call report.  Patient did not want to wait for recommendations.    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 No other right upper extremity DVT identified.

## 2024-05-11 NOTE — Telephone Encounter (Signed)
 Discussed with Dr. KATHEE, rx for Eliquis sent to [facility] and would like to move patient's next appt to 05/16/2024.  Patient and daughter aware of results, rx sent to pharmacy, and appt being moved up.

## 2024-05-11 NOTE — Progress Notes (Signed)
 Patient started on Eliquis-prescription sent..  Discussed with Powell Sharps.  Will have the patient follow-up next week-18th- MD; no labs-   GB

## 2024-05-14 LAB — SURGICAL PATHOLOGY

## 2024-05-15 ENCOUNTER — Inpatient Hospital Stay: Admitting: Internal Medicine

## 2024-05-16 ENCOUNTER — Inpatient Hospital Stay

## 2024-05-16 ENCOUNTER — Encounter: Payer: Self-pay | Admitting: Internal Medicine

## 2024-05-16 ENCOUNTER — Inpatient Hospital Stay: Attending: Internal Medicine | Admitting: Internal Medicine

## 2024-05-16 VITALS — BP 101/70 | HR 95 | Temp 97.6°F | Resp 16 | Ht 63.0 in | Wt 160.3 lb

## 2024-05-16 DIAGNOSIS — Z7901 Long term (current) use of anticoagulants: Secondary | ICD-10-CM | POA: Insufficient documentation

## 2024-05-16 DIAGNOSIS — C8331 Diffuse large B-cell lymphoma, lymph nodes of head, face, and neck: Secondary | ICD-10-CM | POA: Insufficient documentation

## 2024-05-16 DIAGNOSIS — N183 Chronic kidney disease, stage 3 unspecified: Secondary | ICD-10-CM | POA: Insufficient documentation

## 2024-05-16 DIAGNOSIS — D649 Anemia, unspecified: Secondary | ICD-10-CM | POA: Insufficient documentation

## 2024-05-16 DIAGNOSIS — C8338 Diffuse large B-cell lymphoma, lymph nodes of multiple sites: Secondary | ICD-10-CM | POA: Diagnosis not present

## 2024-05-16 DIAGNOSIS — M549 Dorsalgia, unspecified: Secondary | ICD-10-CM | POA: Diagnosis not present

## 2024-05-16 DIAGNOSIS — I82C11 Acute embolism and thrombosis of right internal jugular vein: Secondary | ICD-10-CM | POA: Diagnosis not present

## 2024-05-16 DIAGNOSIS — G893 Neoplasm related pain (acute) (chronic): Secondary | ICD-10-CM | POA: Insufficient documentation

## 2024-05-16 DIAGNOSIS — C8591 Non-Hodgkin lymphoma, unspecified, lymph nodes of head, face, and neck: Secondary | ICD-10-CM

## 2024-05-16 LAB — COMPREHENSIVE METABOLIC PANEL WITH GFR
ALT: 12 U/L (ref 0–44)
AST: 18 U/L (ref 15–41)
Albumin: 3.5 g/dL (ref 3.5–5.0)
Alkaline Phosphatase: 83 U/L (ref 38–126)
Anion gap: 8 (ref 5–15)
BUN: 23 mg/dL (ref 8–23)
CO2: 28 mmol/L (ref 22–32)
Calcium: 9.1 mg/dL (ref 8.9–10.3)
Chloride: 100 mmol/L (ref 98–111)
Creatinine, Ser: 1.62 mg/dL — ABNORMAL HIGH (ref 0.44–1.00)
GFR, Estimated: 33 mL/min — ABNORMAL LOW (ref >60)
Glucose, Bld: 105 mg/dL — ABNORMAL HIGH (ref 70–99)
Potassium: 3.9 mmol/L (ref 3.5–5.1)
Sodium: 136 mmol/L (ref 135–145)
Total Bilirubin: 0.4 mg/dL (ref 0.0–1.2)
Total Protein: 7.3 g/dL (ref 6.5–8.1)

## 2024-05-16 LAB — CBC WITH DIFFERENTIAL/PLATELET
Abs Immature Granulocytes: 0.01 K/uL (ref 0.00–0.07)
Basophils Absolute: 0 K/uL (ref 0.0–0.1)
Basophils Relative: 1 %
Eosinophils Absolute: 0.1 K/uL (ref 0.0–0.5)
Eosinophils Relative: 2 %
HCT: 40.6 % (ref 36.0–46.0)
Hemoglobin: 13.2 g/dL (ref 12.0–15.0)
Immature Granulocytes: 0 %
Lymphocytes Relative: 39 %
Lymphs Abs: 2.1 K/uL (ref 0.7–4.0)
MCH: 30.3 pg (ref 26.0–34.0)
MCHC: 32.5 g/dL (ref 30.0–36.0)
MCV: 93.1 fL (ref 80.0–100.0)
Monocytes Absolute: 0.4 K/uL (ref 0.1–1.0)
Monocytes Relative: 8 %
Neutro Abs: 2.7 K/uL (ref 1.7–7.7)
Neutrophils Relative %: 50 %
Platelets: 190 K/uL (ref 150–400)
RBC: 4.36 MIL/uL (ref 3.87–5.11)
RDW: 13.1 % (ref 11.5–15.5)
WBC: 5.3 K/uL (ref 4.0–10.5)
nRBC: 0 % (ref 0.0–0.2)

## 2024-05-16 LAB — HEPATITIS B SURFACE ANTIGEN: Hepatitis B Surface Ag: NONREACTIVE

## 2024-05-16 LAB — HEPATITIS C ANTIBODY: HCV Ab: NONREACTIVE

## 2024-05-16 LAB — HEPATITIS B CORE ANTIBODY, IGM: Hep B C IgM: NONREACTIVE

## 2024-05-16 LAB — LACTATE DEHYDROGENASE: LDH: 127 U/L (ref 105–235)

## 2024-05-16 MED ORDER — PREDNISONE 20 MG PO TABS: ORAL_TABLET | ORAL | 0 refills | Status: DC
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
## 2024-05-16 NOTE — Progress Notes (Signed)
START ON PATHWAY REGIMEN - Lymphoma and CLL     A cycle is every 21 days:     Prednisone      Rituximab-xxxx      Cyclophosphamide      Vincristine      Etoposide   **Always confirm dose/schedule in your pharmacy ordering system**  Patient Characteristics: Diffuse Large B-Cell Lymphoma or Follicular Lymphoma, Grade 3B, First Line, Stage I and II, No Bulk Disease Type: Not Applicable Disease Type: Diffuse Large B-Cell Lymphoma Disease Type: Not Applicable Line of therapy: First Line Disease Characteristics: No Bulk Intent of Therapy: Curative Intent, Discussed with Patient

## 2024-05-16 NOTE — Progress Notes (Signed)
 05/10/24 rt lymph node bx.  C/o weakness today. Pain rt neck/ear area at bx site. Cough and headache since bx.

## 2024-05-17 ENCOUNTER — Other Ambulatory Visit: Payer: Self-pay

## 2024-05-21 ENCOUNTER — Inpatient Hospital Stay

## 2024-05-21 DIAGNOSIS — C8591 Non-Hodgkin lymphoma, unspecified, lymph nodes of head, face, and neck: Secondary | ICD-10-CM

## 2024-05-21 NOTE — Progress Notes (Signed)
 [facility] CSW Progress Note   Clinical Social Work introduced self to patient during Patient Education with Raoul Moats, Charity fundraiser.  Provided information regarding CSW role, including counseling, advanced care planning and support group.  Answered questions as needed.    Follow Up Plan:  CSW will follow-up with patient by phone     Macario [name], LCSW Clinical Social Worker [facility]    Patient is participating in a Managed Medicaid Plan:  Yes

## 2024-05-22 ENCOUNTER — Ambulatory Visit
Admission: RE | Admit: 2024-05-22 | Discharge: 2024-05-22 | Disposition: A | Discharge status: Home / Self Care | Source: Ambulatory Visit | Attending: Internal Medicine | Admitting: Internal Medicine

## 2024-05-22 ENCOUNTER — Inpatient Hospital Stay

## 2024-05-22 ENCOUNTER — Ambulatory Visit: Admitting: Radiation Oncology

## 2024-05-22 DIAGNOSIS — R590 Localized enlarged lymph nodes: Secondary | ICD-10-CM | POA: Insufficient documentation

## 2024-05-22 DIAGNOSIS — C8591 Non-Hodgkin lymphoma, unspecified, lymph nodes of head, face, and neck: Secondary | ICD-10-CM | POA: Insufficient documentation

## 2024-05-22 DIAGNOSIS — I700 Atherosclerosis of aorta: Secondary | ICD-10-CM | POA: Diagnosis not present

## 2024-05-22 LAB — GLUCOSE, CAPILLARY: Glucose-Capillary: 133 mg/dL — ABNORMAL HIGH (ref 70–99)

## 2024-05-22 MED ORDER — FLUDEOXYGLUCOSE F - 18 (FDG) INJECTION
8.9600 | Freq: Once | INTRAVENOUS | Status: AC | PRN
  Administered 2024-05-22: 8.96 via INTRAVENOUS

## 2024-05-22 NOTE — Progress Notes (Signed)
 [facility] Psychosocial Distress Screening Clinical Social Work  Erin [name] is a 78 y.o. year old female. Clinical Social Work was referred by nurse navigator for positive distress screening. The patient scored a 8 on the Psychosocial Distress Thermometer which indicates severe distress. Clinical Social Worker contacted patient by phone to assess for distress and other psychosocial needs.     Distress Screen:    05/21/2024    6:41 PM  ONCBCN DISTRESS SCREENING  Screening Type Initial Screening  How much distress have you been experiencing in the past week? (0-10) 8     Interventions: Provided brief mental health counseling with regard to adjusting to her illness.  Patient lives in [facility].  She reports decreased anxiety since chemo education class.  Her daughter lives locally and is supportive.  She has four sons.  Patient's faith is important to her.  She attends activities at the facility.     CSW and patient discussed common feeling and emotions when being diagnosed with cancer, and the importance of support during treatment.  CSW informed patient of the support team and support services at [facility].  CSW provided contact information and encouraged patient to call with any questions or concerns.   Follow Up Plan: CSW will follow-up with patient by phone  Patient verbalizes understanding of plan: Yes    Macario [name], LCSW     Patient is participating in a Managed Medicaid Plan:  Yes

## 2024-05-22 NOTE — Progress Notes (Signed)
 Pharmacist Chemotherapy Monitoring - Initial Assessment    Anticipated start date: 06/05/24   The following has been reviewed per standard work regarding the patient's treatment regimen: The patient's diagnosis, treatment plan and drug doses, and organ/hematologic function Lab orders and baseline tests specific to treatment regimen  The treatment plan start date, drug sequencing, and pre-medications Prior authorization status  Patient's documented medication list, including drug-drug interaction screen and prescriptions for anti-emetics and supportive care specific to the treatment regimen The drug concentrations, fluid compatibility, administration routes, and timing of the medications to be used The patient's access for treatment and lifetime cumulative dose history, if applicable  The patient's medication allergies and previous infusion related reactions, if applicable   Changes made to treatment plan:  N/A  Follow up needed:  N/A   Harold Mattes E Raffaella Edison, [facility], 05/22/2024  1:56 PM

## 2024-05-29 ENCOUNTER — Inpatient Hospital Stay: Admitting: Internal Medicine

## 2024-05-29 ENCOUNTER — Encounter: Payer: Self-pay | Admitting: Radiation Oncology

## 2024-05-29 ENCOUNTER — Ambulatory Visit
Admission: RE | Admit: 2024-05-29 | Discharge: 2024-05-29 | Disposition: A | Source: Ambulatory Visit | Attending: Radiation Oncology | Admitting: Radiation Oncology

## 2024-05-29 VITALS — BP 115/80 | HR 74 | Temp 98.3°F | Resp 16 | Wt 161.0 lb

## 2024-05-29 DIAGNOSIS — C8591 Non-Hodgkin lymphoma, unspecified, lymph nodes of head, face, and neck: Secondary | ICD-10-CM

## 2024-05-29 NOTE — Consult Note (Signed)
 NEW PATIENT EVALUATION  Name: Erin [name]  MRN: [id number]  Date:   05/29/2024     DOB: 07-22-1945   This 78 y.o. female patient presents to the clinic for initial evaluation of consolidative radiation therapy to her right neck and patient with history of diffuse large B-cell lymphoma to undergo upfront chemotherapy.  REFERRING PHYSICIAN: [name], *  CHIEF COMPLAINT:  Chief Complaint  Patient presents with   lymph nodes head and neck    DIAGNOSIS: The encounter diagnosis was Lymphoma of lymph nodes of head and neck region [facility]).   PREVIOUS INVESTIGATIONS:  PET/CT scans CT scans reviewed Clinical notes reviewed Pathology reports reviewed  HPI: [name] originally diagnosed back in May 2009 with diffuse large B-cell lymphoma status post R-CHOP x 8.  She developed recurrent DLBCL of the right inguinal nodes status post RICE x 3.  She recently November 2025 developed right cervical adenopathy with biopsy again positive for large B-cell lymphoma.  Cells were positive for CD20 PAX5 and CD10 and BCL6-6.  Dr. B is planning to start R-C EOP every 3 weeks for 3 cycles followed by consolidative radiation.  Patient is seen today is doing well she does still have some pain in her right neck although recently has been started on steroids.  She is having no dysphagia at this time.  She does have some night sweats claims she has memory issues.  She also has CKD-3 which is followed by nephrology.  Seen today for consideration of consolidative radiation to her right neck.  PLANNED TREATMENT REGIMEN: Consolidative radiation therapy to right neck for DL BCL  PAST MEDICAL HISTORY:  has a past medical history of Acute URI, Allergy, Anxiety, Asthma, Cancer (HCC), Diverticulosis, DVT of upper extremity (deep vein thrombosis) (HCC) (07/2008 & 12/2007), Gastric ulcer (10/2007), GERD (gastroesophageal reflux disease), Hepatitis B, History of hiatal hernia, Hypertension,  Laboratory confirmed diagnosis of COVID-19 (06/27/2019), Lower extremity edema, and Lymphoma (HCC).    PAST SURGICAL HISTORY:  Past Surgical History:  Procedure Laterality Date   ANTERIOR AND POSTERIOR REPAIR N/A 03/17/2020   Procedure: ANTERIOR (CYSTOCELE) AND POSTERIOR REPAIR (RECTOCELE);  Surgeon: Connell Davies, MD;  Location: [facility] ORS;  Service: Gynecology;  Laterality: N/A;   CHOLECYSTECTOMY     COLONOSCOPY  11/23/2007, 05/12/1994   COLONOSCOPY WITH PROPOFOL  N/A 03/31/2018   Procedure: COLONOSCOPY WITH PROPOFOL ;  Surgeon: Viktoria Lamar DASEN, MD;  Location: [facility] ENDOSCOPY;  Service: Endoscopy;  Laterality: N/A;   ESOPHAGOGASTRODUODENOSCOPY  09/03/2009, 10/14/2008, 09/30/2008, 11/23/2007   HEMORRHOID SURGERY     LAPAROSCOPIC VAGINAL HYSTERECTOMY WITH SALPINGO OOPHORECTOMY Bilateral 03/17/2020   Procedure: LAPAROSCOPIC ASSISTED VAGINAL HYSTERECTOMY WITH SALPINGO OOPHORECTOMY;  Surgeon: Connell Davies, MD;  Location: [facility] ORS;  Service: Gynecology;  Laterality: Bilateral;   LIVER BIOPSY  01/27/1996   PORTOCAVAL SHUNT PLACEMENT     and removal   PUBOVAGINAL SLING N/A 03/17/2020   Procedure: PUBO-VAGINAL SLING-TVT;  Surgeon: Connell Davies, MD;  Location: [facility] ORS;  Service: Gynecology;  Laterality: N/A;    FAMILY HISTORY: family history includes Asthma in her mother; Cancer in her daughter.  SOCIAL HISTORY:  reports that she has never smoked. She has never used smokeless tobacco. She reports that she does not drink alcohol and does not use drugs.  ALLERGIES: Penicillins, Prednisone , Sulfa antibiotics, Enalapril, and Medrol  [methylprednisolone ]  MEDICATIONS:  Current Outpatient Medications  Medication Sig Dispense Refill   acetaminophen  (TYLENOL ) 325 MG tablet Take 325-650 mg by mouth every 6 (six) hours as needed  for moderate pain or headache.     albuterol  (PROVENTIL  HFA;VENTOLIN  HFA) 108 (90 Base) MCG/ACT inhaler Inhale 1-2 puffs into the lungs every 6 (six) hours as needed for wheezing or  shortness of breath. 1 Inhaler 0   albuterol  (PROVENTIL ) (2.5 MG/3ML) 0.083% nebulizer solution Take 2.5 mg by nebulization every 6 (six) hours as needed for wheezing or shortness of breath.     alendronate  (FOSAMAX ) 70 MG tablet Take 70 mg by mouth every Friday.      APIXABAN  (ELIQUIS ) VTE STARTER PACK (10MG  AND 5MG ) Take as directed on package: start with two-5mg  tablets twice daily for 7 days. On day 8, switch to one-5mg  tablet twice daily. 74 each 0   Biotin  5 MG CAPS Take 5 mg by mouth daily.     Calcium  Carb-Cholecalciferol  (CALCIUM  600 + D PO) Take 1 tablet by mouth daily.     Carboxymethylcellulose Sodium (LUBRICANT EYE DROPS OP) Place 1 drop into both eyes daily as needed (dry eyes).     citalopram  (CELEXA ) 40 MG tablet Take 40 mg by mouth every morning.      Cyanocobalamin  (B-12) 2500 MCG TABS Take 2,500 mcg by mouth daily.     diphenhydrAMINE  (BENADRYL ) 25 MG tablet Take 25 mg by mouth at bedtime.      ferrous sulfate  (FERROUSUL) 325 (65 FE) MG tablet Take 1 tablet (325 mg total) by mouth daily with breakfast. 60 tablet 1   furosemide  (LASIX ) 20 MG tablet Take 20 mg by mouth daily.     gabapentin  (NEURONTIN ) 600 MG tablet Take 600 mg by mouth 3 (three) times daily.      hydrocortisone 2.5 % cream Apply 1 Application topically 2 (two) times daily.     hydrOXYzine  (ATARAX ) 10 MG tablet Take 10 mg by mouth at bedtime.     lidocaine  (LIDODERM ) 5 % Place 1 patch onto the skin daily.     mometasone -formoterol  (DULERA ) 200-5 MCG/ACT AERO Inhale 2 puffs into the lungs 2 (two) times daily. 1 Inhaler 1   montelukast  (SINGULAIR ) 10 MG tablet Take 1 tablet (10 mg total) by mouth at bedtime. 15 tablet 0   Multiple Vitamin (MULTIVITAMIN WITH MINERALS) TABS tablet Take 1 tablet by mouth daily.     omeprazole (PRILOSEC) 20 MG capsule Take 20 mg by mouth 2 (two) times daily.  2   potassium chloride  SA (KLOR-CON ) 20 MEQ tablet Take 1 tablet (20 mEq total) by mouth daily. 7 tablet 0   predniSONE   (DELTASONE ) 20 MG tablet Once a day with food x in AM.  Take 3 pills once a day-for 1 week; and then take 2 pills once a day for 1 week; and then 1 pill once a day. 50 tablet 0   tiZANidine  (ZANAFLEX ) 2 MG tablet Take 2 mg by mouth 3 (three) times daily as needed for muscle spasms.      traMADol  (ULTRAM ) 50 MG tablet Take 1 tablet (50 mg total) by mouth every 12 (twelve) hours as needed. 8 tablet 0   traZODone  (DESYREL ) 100 MG tablet Take 100 mg by mouth.     No current facility-administered medications for this encounter.   Facility-Administered Medications Ordered in Other Encounters  Medication Dose Route Frequency Provider Last Rate Last Admin   sodium chloride  0.9 % injection 10 mL  10 mL Intravenous PRN Choksi, Vester, MD   10 mL at 12/18/14 1121    ECOG PERFORMANCE STATUS:  1 - Symptomatic but completely ambulatory  REVIEW OF SYSTEMS: Patient  denies any weight loss, fatigue, weakness, fever, chills or night sweats. Patient denies any loss of vision, blurred vision. Patient denies any ringing  of the ears or hearing loss. No irregular heartbeat. Patient denies heart murmur or history of fainting. Patient denies any chest pain or pain radiating to her upper extremities. Patient denies any shortness of breath, difficulty breathing at night, cough or hemoptysis. Patient denies any swelling in the lower legs. Patient denies any nausea vomiting, vomiting of blood, or coffee ground material in the vomitus. Patient denies any stomach pain. Patient states has had normal bowel movements no significant constipation or diarrhea. Patient denies any dysuria, hematuria or significant nocturia. Patient denies any problems walking, swelling in the joints or loss of balance. Patient denies any skin changes, loss of hair or loss of weight. Patient denies any excessive worrying or anxiety or significant depression. Patient denies any problems with insomnia. Patient denies excessive thirst, polyuria, polydipsia.  Patient denies any swollen glands, patient denies easy bruising or easy bleeding. Patient denies any recent infections, allergies or URI. Patient s visual fields have not changed significantly in recent time.   PHYSICAL EXAM: BP 115/80   Pulse 74   Temp 98.3 F (36.8 C) (Tympanic)   Resp 16   Wt 161 lb (73 kg)   BMI 28.52 kg/m  Patient has fullness in the right cervical chain consistent with known B-cell lymphoma.  No evidence of left-sided adenopathy supraclavicular adenopathy axillary or inguinal adenopathy is noted.  Well-developed well-nourished patient in NAD. HEENT reveals PERLA, EOMI, discs not visualized.  Oral cavity is clear. No oral mucosal lesions are identified. Neck is clear without evidence of cervical or supraclavicular adenopathy. Lungs are clear to A&P. Cardiac examination is essentially unremarkable with regular rate and rhythm without murmur rub or thrill. Abdomen is benign with no organomegaly or masses noted. Motor sensory and DTR levels are equal and symmetric in the upper and lower extremities. Cranial nerves II through XII are grossly intact. Proprioception is intact. No peripheral adenopathy or edema is identified. No motor or sensory levels are noted. Crude visual fields are within normal range.  LABORATORY DATA: Pathology reports reviewed    RADIOLOGY RESULTS: CT scans PET CT scans reviewed compatible with above-stated findings   IMPRESSION: Diffuse large B-cell lymphoma of the right neck in 78 year old female to undergo 3 cycles of R-C EOP then received consolidative radiation therapy to right neck.  PLAN: At this time patient will go through with chemotherapy.  I will reevaluate her after completion of chemotherapy.  Will review PET scan at that time my dose for consolidation will depend if there is hypermetabolic residual activity in the right neck versus complete response.  Most likely will be about 30 Gray over 3 weeks treating only her right neck.  Patient  comprehends her recommendations well.  I made sure she has follow-up appointments with Dr. B as well as I have given her a 76-month follow-up just to keep tabs on her and not lose her to follow-up.  Patient comprehends my recommendations well.  I would like to take this opportunity to thank you for allowing me to participate in the care of your patient.SABRA Marcey Penton, MD

## 2024-05-30 ENCOUNTER — Other Ambulatory Visit: Payer: Self-pay

## 2024-05-31 ENCOUNTER — Encounter: Payer: Self-pay | Admitting: Internal Medicine

## 2024-06-01 ENCOUNTER — Other Ambulatory Visit: Payer: Self-pay | Admitting: *Deleted

## 2024-06-01 ENCOUNTER — Telehealth: Payer: Self-pay | Admitting: *Deleted

## 2024-06-01 DIAGNOSIS — C8591 Non-Hodgkin lymphoma, unspecified, lymph nodes of head, face, and neck: Secondary | ICD-10-CM

## 2024-06-01 MED ORDER — PREDNISONE 50 MG PO TABS: 100.0000 mg | ORAL_TABLET | Freq: Every day | ORAL | 2 refills | Status: DC

## 2024-06-01 MED ORDER — ALLOPURINOL 300 MG PO TABS: 300.0000 mg | ORAL_TABLET | Freq: Every day | ORAL | 3 refills | Status: AC

## 2024-06-01 MED ORDER — ACYCLOVIR 400 MG PO TABS: 400.0000 mg | ORAL_TABLET | Freq: Every day | ORAL | 3 refills | Status: AC

## 2024-06-01 MED ORDER — ONDANSETRON HCL 8 MG PO TABS: 8.0000 mg | ORAL_TABLET | Freq: Three times a day (TID) | ORAL | 1 refills | Status: DC | PRN

## 2024-06-01 NOTE — Telephone Encounter (Signed)
 T/C to patient to inform her that prescriptions have been sent to her pharmacy today. These medications are related to her chemotherapy. Informed her not to start any of them. Dr B will explain these medications on your visit on Monday. I also called pt's daughter Amy with this info. Also went over all her visits for next week.

## 2024-06-04 ENCOUNTER — Encounter: Payer: Self-pay | Admitting: Internal Medicine

## 2024-06-04 ENCOUNTER — Inpatient Hospital Stay: Attending: Internal Medicine | Admitting: Internal Medicine

## 2024-06-04 ENCOUNTER — Telehealth: Payer: Self-pay | Admitting: *Deleted

## 2024-06-04 ENCOUNTER — Inpatient Hospital Stay: Attending: Internal Medicine

## 2024-06-04 VITALS — BP 107/67 | HR 84 | Temp 96.9°F | Resp 16 | Ht 63.0 in | Wt 161.3 lb

## 2024-06-04 DIAGNOSIS — Z7901 Long term (current) use of anticoagulants: Secondary | ICD-10-CM | POA: Diagnosis not present

## 2024-06-04 DIAGNOSIS — F410 Panic disorder [episodic paroxysmal anxiety] without agoraphobia: Secondary | ICD-10-CM | POA: Diagnosis not present

## 2024-06-04 DIAGNOSIS — C8338 Diffuse large B-cell lymphoma, lymph nodes of multiple sites: Secondary | ICD-10-CM | POA: Diagnosis not present

## 2024-06-04 DIAGNOSIS — C8331 Diffuse large B-cell lymphoma, lymph nodes of head, face, and neck: Secondary | ICD-10-CM | POA: Diagnosis not present

## 2024-06-04 DIAGNOSIS — C8591 Non-Hodgkin lymphoma, unspecified, lymph nodes of head, face, and neck: Secondary | ICD-10-CM

## 2024-06-04 DIAGNOSIS — Z5189 Encounter for other specified aftercare: Secondary | ICD-10-CM | POA: Insufficient documentation

## 2024-06-04 DIAGNOSIS — N183 Chronic kidney disease, stage 3 unspecified: Secondary | ICD-10-CM | POA: Insufficient documentation

## 2024-06-04 DIAGNOSIS — J45909 Unspecified asthma, uncomplicated: Secondary | ICD-10-CM | POA: Diagnosis not present

## 2024-06-04 DIAGNOSIS — I82C11 Acute embolism and thrombosis of right internal jugular vein: Secondary | ICD-10-CM | POA: Insufficient documentation

## 2024-06-04 DIAGNOSIS — Z5111 Encounter for antineoplastic chemotherapy: Secondary | ICD-10-CM | POA: Diagnosis present

## 2024-06-04 DIAGNOSIS — Z86718 Personal history of other venous thrombosis and embolism: Secondary | ICD-10-CM | POA: Diagnosis not present

## 2024-06-04 DIAGNOSIS — Z5112 Encounter for antineoplastic immunotherapy: Secondary | ICD-10-CM | POA: Diagnosis not present

## 2024-06-04 DIAGNOSIS — M549 Dorsalgia, unspecified: Secondary | ICD-10-CM | POA: Insufficient documentation

## 2024-06-04 LAB — CBC WITH DIFFERENTIAL (CANCER CENTER ONLY)
Abs Immature Granulocytes: 0.06 K/uL (ref 0.00–0.07)
Basophils Absolute: 0 K/uL (ref 0.0–0.1)
Basophils Relative: 0 %
Eosinophils Absolute: 0.1 K/uL (ref 0.0–0.5)
Eosinophils Relative: 1 %
HCT: 41.4 % (ref 36.0–46.0)
Hemoglobin: 13.5 g/dL (ref 12.0–15.0)
Immature Granulocytes: 1 %
Lymphocytes Relative: 15 %
Lymphs Abs: 1.4 K/uL (ref 0.7–4.0)
MCH: 30.4 pg (ref 26.0–34.0)
MCHC: 32.6 g/dL (ref 30.0–36.0)
MCV: 93.2 fL (ref 80.0–100.0)
Monocytes Absolute: 0.4 K/uL (ref 0.1–1.0)
Monocytes Relative: 5 %
Neutro Abs: 7.2 K/uL (ref 1.7–7.7)
Neutrophils Relative %: 78 %
Platelet Count: 142 K/uL — ABNORMAL LOW (ref 150–400)
RBC: 4.44 MIL/uL (ref 3.87–5.11)
RDW: 13.7 % (ref 11.5–15.5)
WBC Count: 9.2 K/uL (ref 4.0–10.5)
nRBC: 0 % (ref 0.0–0.2)

## 2024-06-04 LAB — URIC ACID: Uric Acid, Serum: 2.8 mg/dL (ref 2.5–7.1)

## 2024-06-04 LAB — CMP (CANCER CENTER ONLY)
ALT: 15 U/L (ref 0–44)
AST: 18 U/L (ref 15–41)
Albumin: 3.9 g/dL (ref 3.5–5.0)
Alkaline Phosphatase: 80 U/L (ref 38–126)
Anion gap: 10 (ref 5–15)
BUN: 22 mg/dL (ref 8–23)
CO2: 28 mmol/L (ref 22–32)
Calcium: 9.4 mg/dL (ref 8.9–10.3)
Chloride: 100 mmol/L (ref 98–111)
Creatinine: 1.6 mg/dL — ABNORMAL HIGH (ref 0.44–1.00)
GFR, Estimated: 33 mL/min — ABNORMAL LOW (ref >60)
Glucose, Bld: 131 mg/dL — ABNORMAL HIGH (ref 70–99)
Potassium: 3.8 mmol/L (ref 3.5–5.1)
Sodium: 138 mmol/L (ref 135–145)
Total Bilirubin: 0.3 mg/dL (ref 0.0–1.2)
Total Protein: 6.5 g/dL (ref 6.5–8.1)

## 2024-06-04 NOTE — Telephone Encounter (Signed)
 Spoke with daughter, Amy, regarding appts again this week. Pt may hold off on the allopurinol  for now. She does need to take the acyclovir  every day. Starting tomm she will take 2 50 mg tabs prednisone  on days 1-5 of chemo. Daughter understands directions.Pt is coming to appts via [employer].
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
## 2024-06-04 NOTE — Progress Notes (Signed)
 C/o mucus in her throat, hard to get up and makes her feel like she can't breathe at times.  C/o not being able to sleep, she has trazodone  but doesn't always help.

## 2024-06-05 ENCOUNTER — Other Ambulatory Visit: Payer: Self-pay | Admitting: Internal Medicine

## 2024-06-05 ENCOUNTER — Inpatient Hospital Stay: Admitting: Nurse Practitioner

## 2024-06-05 ENCOUNTER — Inpatient Hospital Stay

## 2024-06-05 VITALS — BP 113/60 | HR 100 | Temp 97.1°F | Resp 18

## 2024-06-05 DIAGNOSIS — R4589 Other symptoms and signs involving emotional state: Secondary | ICD-10-CM | POA: Diagnosis not present

## 2024-06-05 DIAGNOSIS — C8591 Non-Hodgkin lymphoma, unspecified, lymph nodes of head, face, and neck: Secondary | ICD-10-CM

## 2024-06-05 DIAGNOSIS — Z5111 Encounter for antineoplastic chemotherapy: Secondary | ICD-10-CM | POA: Diagnosis not present

## 2024-06-05 DIAGNOSIS — C8330 Diffuse large B-cell lymphoma, unspecified site: Secondary | ICD-10-CM | POA: Diagnosis not present

## 2024-06-05 MED ORDER — SODIUM CHLORIDE 0.9 % IV SOLN
INTRAVENOUS | Status: DC
  Filled 2024-06-05: qty 250

## 2024-06-05 MED ORDER — SODIUM CHLORIDE 0.9 % IV SOLN
37.5000 mg/m2 | Freq: Once | INTRAVENOUS | Status: AC
  Administered 2024-06-05: 68 mg via INTRAVENOUS
  Filled 2024-06-05: qty 3.4

## 2024-06-05 MED ORDER — SODIUM CHLORIDE 0.9 % IV SOLN: 50.0000 mg/m2 | Freq: Once | INTRAVENOUS | Status: DC

## 2024-06-05 MED ORDER — SODIUM CHLORIDE 0.9 % IV SOLN
750.0000 mg/m2 | Freq: Once | INTRAVENOUS | Status: AC
  Administered 2024-06-05: 1500 mg via INTRAVENOUS
  Filled 2024-06-05: qty 75

## 2024-06-05 MED ORDER — LORAZEPAM 2 MG/ML IJ SOLN
0.5000 mg | Freq: Once | INTRAMUSCULAR | Status: AC
  Administered 2024-06-05: 0.5 mg via INTRAVENOUS
  Filled 2024-06-05: qty 1

## 2024-06-05 MED ORDER — DEXAMETHASONE SOD PHOSPHATE PF 10 MG/ML IJ SOLN
10.0000 mg | Freq: Once | INTRAMUSCULAR | Status: AC
  Administered 2024-06-05: 10 mg via INTRAVENOUS

## 2024-06-05 MED ORDER — DIPHENHYDRAMINE HCL 25 MG PO TABS
50.0000 mg | ORAL_TABLET | Freq: Once | ORAL | Status: AC
  Administered 2024-06-05: 50 mg via ORAL
  Filled 2024-06-05: qty 2

## 2024-06-05 MED ORDER — ACETAMINOPHEN 325 MG PO TABS
650.0000 mg | ORAL_TABLET | Freq: Once | ORAL | Status: AC
  Administered 2024-06-05: 650 mg via ORAL
  Filled 2024-06-05: qty 2

## 2024-06-05 MED ORDER — PALONOSETRON HCL INJECTION 0.25 MG/5ML
0.2500 mg | Freq: Once | INTRAVENOUS | Status: AC
  Administered 2024-06-05: 0.25 mg via INTRAVENOUS
  Filled 2024-06-05: qty 5

## 2024-06-05 MED ORDER — FAMOTIDINE IN NACL 20-0.9 MG/50ML-% IV SOLN
20.0000 mg | Freq: Once | INTRAVENOUS | Status: AC
  Administered 2024-06-05: 20 mg via INTRAVENOUS

## 2024-06-05 MED ORDER — VINCRISTINE SULFATE CHEMO INJECTION 1 MG/ML
2.0000 mg | Freq: Once | INTRAVENOUS | Status: AC
  Administered 2024-06-05: 2 mg via INTRAVENOUS
  Filled 2024-06-05: qty 2

## 2024-06-05 MED ORDER — SODIUM CHLORIDE 0.9 % IV SOLN
375.0000 mg/m2 | Freq: Once | INTRAVENOUS | Status: AC
  Administered 2024-06-05: 700 mg via INTRAVENOUS
  Filled 2024-06-05: qty 20

## 2024-06-05 NOTE — Progress Notes (Signed)
 Per Dr. Rennie,  OK to reduce etoposide  by 25% based on renal function (Scr 1.60; CrCl 34), OK to continue with EPIC dose rounding of cytoxan  up to 1500 mg     Raygen Dahm E Arpita Fentress, PharmD, BCPS Clinical Pharmacist

## 2024-06-05 NOTE — Patient Instructions (Signed)

## 2024-06-05 NOTE — Progress Notes (Signed)
 Patient here for initial treatment with R-CEOP today. Patient received Vincristine , Cytoxan  and Etoposide  without incident. Rituxan  started and titrated per guidelines, and after the 3rd rate increase, patient appeared to become anxious, and complained of shortness of breath and chest tightness. Patient thought she was having an asthma attack, and used her albuterol  inhaler. Rituxan  paused and Morna Husband, NP came to chairside to evaluate patient. Lungs were clear and VSS. Patient admitted having a strong history of anxiety and being very anxious today. Per Morna Husband, NP, gave patient Pepcid  and Ativan  0.5 mg, and after 20 minutes, restarted the Rituxan , and was able to complete the remainder without further incident. Patient remained somewhat anxious, though she was slightly improved. Patient discharged via wheelchair to daughter's fleeta.

## 2024-06-06 ENCOUNTER — Inpatient Hospital Stay

## 2024-06-06 ENCOUNTER — Encounter: Payer: Self-pay | Admitting: Internal Medicine

## 2024-06-06 ENCOUNTER — Telehealth: Payer: Self-pay

## 2024-06-06 VITALS — BP 98/59 | HR 80 | Temp 96.6°F | Resp 18

## 2024-06-06 DIAGNOSIS — Z5111 Encounter for antineoplastic chemotherapy: Secondary | ICD-10-CM | POA: Diagnosis not present

## 2024-06-06 DIAGNOSIS — C8591 Non-Hodgkin lymphoma, unspecified, lymph nodes of head, face, and neck: Secondary | ICD-10-CM

## 2024-06-06 MED ORDER — SODIUM CHLORIDE 0.9 % IV SOLN
INTRAVENOUS | Status: DC
  Filled 2024-06-06: qty 250

## 2024-06-06 MED ORDER — PROCHLORPERAZINE MALEATE 10 MG PO TABS
10.0000 mg | ORAL_TABLET | Freq: Once | ORAL | Status: AC
  Administered 2024-06-06: 10 mg via ORAL
  Filled 2024-06-06: qty 1

## 2024-06-06 MED ORDER — SODIUM CHLORIDE 0.9 % IV SOLN
37.5000 mg/m2 | Freq: Once | INTRAVENOUS | Status: AC
  Administered 2024-06-06: 68 mg via INTRAVENOUS
  Filled 2024-06-06: qty 3.4

## 2024-06-06 NOTE — Patient Instructions (Signed)
 [facility] - A DEPT OF [facility]  Discharge Instructions: Thank you for choosing [facility] to provide your oncology and hematology care.  If you have a lab appointment with the Cancer Center, please go directly to the Cancer Center and check in at the registration area.  Wear comfortable clothing and clothing appropriate for easy access to any Portacath or PICC line.   We strive to give you quality time with your provider. You may need to reschedule your appointment if you arrive late (15 or more minutes).  Arriving late affects you and other patients whose appointments are after yours.  Also, if you miss three or more appointments without notifying the office, you may be dismissed from the clinic at the providers discretion.      For prescription refill requests, have your pharmacy contact our office and allow 72 hours for refills to be completed.    Today you received the following chemotherapy and/or immunotherapy agents etopiside      To help prevent nausea and vomiting after your treatment, we encourage you to take your nausea medication as directed.  BELOW ARE SYMPTOMS THAT SHOULD BE REPORTED IMMEDIATELY: *FEVER GREATER THAN 100.4 F (38 C) OR HIGHER *CHILLS OR SWEATING *NAUSEA AND VOMITING THAT IS NOT CONTROLLED WITH YOUR NAUSEA MEDICATION *UNUSUAL SHORTNESS OF BREATH *UNUSUAL BRUISING OR BLEEDING *URINARY PROBLEMS (pain or burning when urinating, or frequent urination) *BOWEL PROBLEMS (unusual diarrhea, constipation, pain near the anus) TENDERNESS IN MOUTH AND THROAT WITH OR WITHOUT PRESENCE OF ULCERS (sore throat, sores in mouth, or a toothache) UNUSUAL RASH, SWELLING OR PAIN  UNUSUAL VAGINAL DISCHARGE OR ITCHING   Items with * indicate a potential emergency and should be followed up as soon as possible or go to the Emergency Department if any problems should occur.  Please show the CHEMOTHERAPY ALERT CARD or IMMUNOTHERAPY  ALERT CARD at check-in to the Emergency Department and triage nurse.  Should you have questions after your visit or need to cancel or reschedule your appointment, please contact [facility] - A DEPT OF [facility]  [phone] and follow the prompts.  Office hours are 8:00 a.m. to 4:30 p.m. Monday - Friday. Please note that voicemails left after 4:00 p.m. may not be returned until the following business day.  We are closed weekends and major holidays. You have access to a nurse at all times for urgent questions. Please call the main number to the clinic [phone] and follow the prompts.  For any non-urgent questions, you may also contact your provider using MyChart. We now offer e-Visits for anyone 61 and older to request care online for non-urgent symptoms. For details visit [email or web address].   Also download the MyChart app! Go to the app store, search MyChart, open the app, select [facility], and log in with your MyChart username and password.

## 2024-06-06 NOTE — Progress Notes (Signed)
 [facility] OFFICE PROGRESS NOTE  Patient Care Team: Center, [facility] as PCP - General (General Practice) [name], MD as Consulting Physician (Oncology)   SUMMARY OF ONCOLOGIC HISTORY: Oncology History Overview Note  # MAY 2009- DLBCL [Bx- laprscopic] s/p R-CHOP x8 [finished Oct 2009]  # FEB 2015- Recurrent DLBCL [right inguinal LN Bx-; PET- Left supra-renal LN; BMBx-neg] s/p RICE x3 [Dr.Pandit]; excellent Response; not Transplant candidate  [sec to poor social support]; s/p RT to right Inguinal LN; CT OCT 2016- NED.   # NOV 2025- DLBCL of the right neck-  NOv 2025- Lymph node, biopsy, Right cervical lymph node mass :     - LARGE B CELL LYMPHOMA;  Diagnosis Note : - Sections from the cervical lymph node show atypical lymphoid      cells with areas of necrosis and proliferation. The atypical cells are positive      for CD20, PAX-5, CD10 and BCL-6. The cells appear to be negative for BCL-2,      CYCLIN D1, CD30 and CD5. The ki-67 proliferation index is approximately 70%.      MUM-1 highlights scattered cells. The patient's history of diffuse large Bc ell    lymphoma with BCL-2 positivity is noted. The BCL-2 negative staining is     dissimilar from the patient's previous findings. A high grade B cell lymphoma      panel will be performed for further characterization and reported in an     addendum.     Flow cytometric analysis identified a clonal B cell population [phone]) and     the findings are consistent within th the above interpretation. 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  Alone.   78 -year-old female patient with above history of recurrent diffuse large B-cell lymphoma seen in infusion today for Day1 Cycle 1 RCHOP.  Infusion nurse called me back during the beginning of the first titration of Rituxan .  Patient had only received about a minute into the first titration.  She started complaining of wheezing and shortness of breath.  She does have a history of asthma and had her inhaler with her in which she did utilize at that time.  Rituxan  was paused and symptoms of shortness of breath completely resolved with the use of her albuterol  inhaler.  Vitals remained stable.  Once I got back there she did not appear short of breath lungs were clear.  We decided to proceed with titration of Rituxan  as she had not gotten enough of the medication to really tell if it was a true reaction.  Patient in agreement with continuing infusion.  She tolerated well until the third titration and she started reporting tightness in her chest and shortness of breath.  Vitals remained stable no wheezing heard with auscultation no coughing  no visible shortness of breath.  Patient has a long history of anxiety and anxiety attacks.  Patient's daughter reports that this mimics her anxiety attacks.  Decision was made to stop the Rituxan  infusion temporarily at that time we administered Pepcid  and 0.5 of Ativan .  Lungs remained clear vitals remained stable patient in no acute distress she did appear to be very anxious.  She reported feeling anxious.  After Pepcid  and Ativan  symptoms completely resolved.  We were able to resume and successfully complete Rituxan  infusion at that time.  Patient tolerated well.   History of Present Illness               Review of Systems  Constitutional:  Negative for chills, diaphoresis, fever, malaise/fatigue and weight loss.  HENT:  Negative for nosebleeds and sore throat.   Eyes:  Negative for double vision.  Respiratory:  Positive for shortness of breath. Negative for cough, hemoptysis, sputum production and wheezing.   Cardiovascular:  Positive for leg swelling. Negative for chest pain, palpitations and orthopnea.  Gastrointestinal:  Negative for abdominal pain, blood in stool, constipation, diarrhea, heartburn, melena, nausea and vomiting.  Genitourinary:  Negative for dysuria, frequency and urgency.  Musculoskeletal:  Positive for back pain, joint pain and neck pain.  Neurological:  Negative for dizziness, tingling, focal weakness, weakness and headaches.  Endo/Heme/Allergies:  Does not bruise/bleed easily.  Psychiatric/Behavioral:  Negative for depression. The patient is not nervous/anxious and does not have insomnia.       PAST MEDICAL HISTORY :  Past Medical History:  Diagnosis Date   Acute URI    Allergy    Anxiety    Asthma    well controlled   Cancer (HCC)    lymphoma in remission   Diverticulosis    DVT of upper extremity (deep vein thrombosis) (HCC) 07/2008 & 12/2007   Gastric ulcer 10/2007   EGD   GERD (gastroesophageal reflux disease)    Hepatitis B    treated   History of hiatal hernia    Hypertension    Laboratory confirmed diagnosis of COVID-19 06/27/2019   Lower extremity edema    Lymphoma (HCC)    Stage 3 large B cell lymphoma    PAST SURGICAL HISTORY :   Past Surgical History:  Procedure Laterality Date   ANTERIOR AND POSTERIOR REPAIR N/A 03/17/2020   Procedure: ANTERIOR (CYSTOCELE) AND POSTERIOR REPAIR (RECTOCELE);  Surgeon: Connell Davies, MD;  Location: [facility] ORS;  Service: Gynecology;  Laterality: N/A;   CHOLECYSTECTOMY     COLONOSCOPY  11/23/2007, 05/12/1994   COLONOSCOPY WITH PROPOFOL  N/A 03/31/2018   Procedure: COLONOSCOPY WITH PROPOFOL ;   Surgeon: Viktoria Lamar DASEN, MD;  Location: [facility] ENDOSCOPY;  Service: Endoscopy;  Laterality: N/A;   ESOPHAGOGASTRODUODENOSCOPY  09/03/2009, 10/14/2008, 09/30/2008, 11/23/2007   HEMORRHOID SURGERY     LAPAROSCOPIC VAGINAL HYSTERECTOMY WITH SALPINGO OOPHORECTOMY Bilateral 03/17/2020   Procedure: LAPAROSCOPIC ASSISTED VAGINAL HYSTERECTOMY WITH SALPINGO OOPHORECTOMY;  Surgeon: Connell Davies, MD;  Location: [facility] ORS;  Service: Gynecology;  Laterality: Bilateral;   LIVER BIOPSY  01/27/1996   PORTOCAVAL SHUNT PLACEMENT     and removal   PUBOVAGINAL SLING N/A 03/17/2020   Procedure: PUBO-VAGINAL SLING-TVT;  Surgeon: Connell Davies, MD;  Location: [facility] ORS;  Service: Gynecology;  Laterality: N/A;    FAMILY HISTORY :   Family History  Problem Relation Age of Onset   Cancer Daughter    Asthma Mother    Breast cancer Neg Hx     SOCIAL HISTORY:   Social  History   Tobacco Use   Smoking status: Never   Smokeless tobacco: Never  Vaping Use   Vaping status: Never Used  Substance Use Topics   Alcohol use: No   Drug use: Never    ALLERGIES:  is allergic to penicillins, prednisone , sulfa antibiotics, enalapril, and medrol  [methylprednisolone ].  MEDICATIONS:  Current Outpatient Medications  Medication Sig Dispense Refill   acetaminophen  (TYLENOL ) 325 MG tablet Take 325-650 mg by mouth every 6 (six) hours as needed for moderate pain or headache.     acyclovir  (ZOVIRAX ) 400 MG tablet Take 1 tablet (400 mg total) by mouth daily. 30 tablet 3   albuterol  (PROVENTIL  HFA;VENTOLIN  HFA) 108 (90 Base) MCG/ACT inhaler Inhale 1-2 puffs into the lungs every 6 (six) hours as needed for wheezing or shortness of breath. 1 Inhaler 0   albuterol  (PROVENTIL ) (2.5 MG/3ML) 0.083% nebulizer solution Take 2.5 mg by nebulization every 6 (six) hours as needed for wheezing or shortness of breath.     alendronate  (FOSAMAX ) 70 MG tablet Take 70 mg by mouth every Friday.      allopurinol  (ZYLOPRIM ) 300 MG tablet Take 1 tablet (300  mg total) by mouth daily. 30 tablet 3   APIXABAN  (ELIQUIS ) VTE STARTER PACK (10MG  AND 5MG ) Take as directed on package: start with two-5mg  tablets twice daily for 7 days. On day 8, switch to one-5mg  tablet twice daily. 74 each 0   Biotin  5 MG CAPS Take 5 mg by mouth daily.     Calcium  Carb-Cholecalciferol  (CALCIUM  600 + D PO) Take 1 tablet by mouth daily.     Carboxymethylcellulose Sodium (LUBRICANT EYE DROPS OP) Place 1 drop into both eyes daily as needed (dry eyes).     citalopram  (CELEXA ) 40 MG tablet Take 40 mg by mouth every morning.      Cyanocobalamin  (B-12) 2500 MCG TABS Take 2,500 mcg by mouth daily.     diphenhydrAMINE  (BENADRYL ) 25 MG tablet Take 25 mg by mouth at bedtime.      ferrous sulfate  (FERROUSUL) 325 (65 FE) MG tablet Take 1 tablet (325 mg total) by mouth daily with breakfast. 60 tablet 1   furosemide  (LASIX ) 20 MG tablet Take 20 mg by mouth daily.     gabapentin  (NEURONTIN ) 600 MG tablet Take 600 mg by mouth 3 (three) times daily.      hydrocortisone 2.5 % cream Apply 1 Application topically 2 (two) times daily.     hydrOXYzine  (ATARAX ) 10 MG tablet Take 10 mg by mouth at bedtime.     lidocaine  (LIDODERM ) 5 % Place 1 patch onto the skin daily.     mometasone -formoterol  (DULERA ) 200-5 MCG/ACT AERO Inhale 2 puffs into the lungs 2 (two) times daily. 1 Inhaler 1   montelukast  (SINGULAIR ) 10 MG tablet Take 1 tablet (10 mg total) by mouth at bedtime. 15 tablet 0   Multiple Vitamin (MULTIVITAMIN WITH MINERALS) TABS tablet Take 1 tablet by mouth daily.     omeprazole (PRILOSEC) 20 MG capsule Take 20 mg by mouth 2 (two) times daily.  2   ondansetron  (ZOFRAN ) 8 MG tablet Take 1 tablet (8 mg total) by mouth every 8 (eight) hours as needed for nausea or vomiting. Start on third day after cyclophosphamide  chemotherapy. 30 tablet 1   potassium chloride  SA (KLOR-CON ) 20 MEQ tablet Take 1 tablet (20 mEq total) by mouth daily. 7 tablet 0   predniSONE  (DELTASONE ) 50 MG tablet Take 2 tablets  (100 mg total) by mouth daily. Take on days 1-5  of chemotherapy. 10 tablet 2   tiZANidine  (ZANAFLEX ) 2 MG tablet Take 2 mg by mouth 3 (three) times daily as needed for muscle spasms.      traMADol  (ULTRAM ) 50 MG tablet Take 1 tablet (50 mg total) by mouth every 12 (twelve) hours as needed. 8 tablet 0   traZODone  (DESYREL ) 100 MG tablet Take 100 mg by mouth.     No current facility-administered medications for this visit.   Facility-Administered Medications Ordered in Other Visits  Medication Dose Route Frequency Provider Last Rate Last Admin   sodium chloride  0.9 % injection 10 mL  10 mL Intravenous PRN Choksi, Vester, MD   10 mL at 12/18/14 1121    PHYSICAL EXAMINATION: ECOG PERFORMANCE STATUS: 0 - Asymptomatic  There were no vitals taken for this visit.  There were no vitals filed for this visit.  3-4 cm mass noted on right submandibular region.   Physical Exam Constitutional:      Comments: Alone.  Ambulating independently.  HENT:     Head: Normocephalic and atraumatic.     Mouth/Throat:     Pharynx: No oropharyngeal exudate.  Eyes:     Pupils: Pupils are equal, round, and reactive to light.  Cardiovascular:     Rate and Rhythm: Normal rate and regular rhythm.  Pulmonary:     Effort: No respiratory distress.     Breath sounds: No wheezing.     Comments: Scattered wheezing bilaterally.  No crackles. Abdominal:     General: Bowel sounds are normal. There is no distension.     Palpations: Abdomen is soft. There is no mass.     Tenderness: There is no abdominal tenderness. There is no guarding or rebound.  Musculoskeletal:        General: No tenderness. Normal range of motion.     Cervical back: Normal range of motion and neck supple.  Skin:    General: Skin is warm.  Neurological:     Mental Status: She is alert and oriented to person, place, and time.  Psychiatric:        Mood and Affect: Affect normal.     LABORATORY DATA:  I have reviewed the data as listed     Component Value Date/Time   NA 138 06/04/2024 0806   NA 142 11/20/2013 0842   K 3.8 06/04/2024 0806   K 4.0 11/20/2013 0842   CL 100 06/04/2024 0806   CL 105 11/20/2013 0842   CO2 28 06/04/2024 0806   CO2 30 11/20/2013 0842   GLUCOSE 131 (H) 06/04/2024 0806   GLUCOSE 94 11/20/2013 0842   BUN 22 06/04/2024 0806   BUN 12 11/20/2013 0842   CREATININE 1.60 (H) 06/04/2024 0806   CREATININE 1.06 05/07/2014 0937   CALCIUM  9.4 06/04/2024 0806   CALCIUM  8.3 (L) 11/20/2013 0842   PROT 6.5 06/04/2024 0806   PROT 6.9 05/07/2014 0937   ALBUMIN 3.9 06/04/2024 0806   ALBUMIN 3.5 05/07/2014 0937   AST 18 06/04/2024 0806   ALT 15 06/04/2024 0806   ALT 22 05/07/2014 0937   ALKPHOS 80 06/04/2024 0806   ALKPHOS 143 (H) 05/07/2014 0937   BILITOT 0.3 06/04/2024 0806   GFRNONAA 33 (L) 06/04/2024 0806   GFRNONAA 55 (L) 05/07/2014 0937   GFRNONAA 50 (L) 02/12/2014 1104   GFRAA 43 (L) 03/20/2020 1537   GFRAA >60 05/07/2014 0937   GFRAA 58 (L) 02/12/2014 1104    No results found for: SPEP, UPEP  Lab Results  Component  Value Date   WBC 9.2 06/04/2024   NEUTROABS 7.2 06/04/2024   HGB 13.5 06/04/2024   HCT 41.4 06/04/2024   MCV 93.2 06/04/2024   PLT 142 (L) 06/04/2024      Chemistry      Component Value Date/Time   NA 138 06/04/2024 0806   NA 142 11/20/2013 0842   K 3.8 06/04/2024 0806   K 4.0 11/20/2013 0842   CL 100 06/04/2024 0806   CL 105 11/20/2013 0842   CO2 28 06/04/2024 0806   CO2 30 11/20/2013 0842   BUN 22 06/04/2024 0806   BUN 12 11/20/2013 0842   CREATININE 1.60 (H) 06/04/2024 0806   CREATININE 1.06 05/07/2014 0937      Component Value Date/Time   CALCIUM  9.4 06/04/2024 0806   CALCIUM  8.3 (L) 11/20/2013 0842   ALKPHOS 80 06/04/2024 0806   ALKPHOS 143 (H) 05/07/2014 0937   AST 18 06/04/2024 0806   ALT 15 06/04/2024 0806   ALT 22 05/07/2014 0937   BILITOT 0.3 06/04/2024 0806        ASSESSMENT & PLAN:   Anxiety attack during infusion: Patient with a  long history of anxiety she is on Celexa .  Today during infusion she did have an anxiety attack that included some shortness of breath tightness in her chest and increased shakiness in hands.  Symptoms completely resolved with Ativan  and she was able to complete the Rituxan  infusion without any difficulty.  She may benefit from something for anxiety as premedication next infusion.  Asthma: She does have a history of asthma.  She takes as needed albuterol  for wheezing and shortness of breath as well as Singulair  at night.  She reports that she had to take her rescue inhaler last night due to increased shortness of breath and wheezing.  No wheezing heard today with auscultation.  No visible shortness of breath.  Oxygen saturations stable.  All symptoms of asthma resolved with the use of her albuterol  today.  Continue to monitor.  Follow-up plan: Follow-up as scheduled and as needed     Morna Husband, NP 06/06/2024 4:10 PM

## 2024-06-06 NOTE — Telephone Encounter (Signed)
Telephone call to patient for follow up after receiving first infusion.   Patient states infusion went great.  States eating good and drinking plenty of fluids.   Denies any nausea or vomiting.  Encouraged patient to call for any concerns or questions. 

## 2024-06-07 ENCOUNTER — Inpatient Hospital Stay

## 2024-06-07 VITALS — BP 101/57 | HR 100 | Temp 98.1°F | Resp 18

## 2024-06-07 DIAGNOSIS — Z5111 Encounter for antineoplastic chemotherapy: Secondary | ICD-10-CM | POA: Diagnosis not present

## 2024-06-07 DIAGNOSIS — C8591 Non-Hodgkin lymphoma, unspecified, lymph nodes of head, face, and neck: Secondary | ICD-10-CM

## 2024-06-07 MED ORDER — SODIUM CHLORIDE 0.9 % IV SOLN
37.5000 mg/m2 | Freq: Once | INTRAVENOUS | Status: AC
  Administered 2024-06-07: 68 mg via INTRAVENOUS
  Filled 2024-06-07: qty 3.4

## 2024-06-07 MED ORDER — PROCHLORPERAZINE MALEATE 10 MG PO TABS
10.0000 mg | ORAL_TABLET | Freq: Once | ORAL | Status: AC
  Administered 2024-06-07: 10 mg via ORAL
  Filled 2024-06-07: qty 1

## 2024-06-07 MED ORDER — SODIUM CHLORIDE 0.9 % IV SOLN
INTRAVENOUS | Status: DC
  Filled 2024-06-07: qty 250

## 2024-06-07 NOTE — Patient Instructions (Signed)
 [facility] - A DEPT OF [facility]  Discharge Instructions: Thank you for choosing [facility] to provide your oncology and hematology care.  If you have a lab appointment with the Cancer Center, please go directly to the Cancer Center and check in at the registration area.  Wear comfortable clothing and clothing appropriate for easy access to any Portacath or PICC line.   We strive to give you quality time with your provider. You may need to reschedule your appointment if you arrive late (15 or more minutes).  Arriving late affects you and other patients whose appointments are after yours.  Also, if you miss three or more appointments without notifying the office, you may be dismissed from the clinic at the provider's discretion.      For prescription refill requests, have your pharmacy contact our office and allow 72 hours for refills to be completed.    Today you received the following chemotherapy and/or immunotherapy agents Etoposide      To help prevent nausea and vomiting after your treatment, we encourage you to take your nausea medication as directed.  BELOW ARE SYMPTOMS THAT SHOULD BE REPORTED IMMEDIATELY: *FEVER GREATER THAN 100.4 F (38 C) OR HIGHER *CHILLS OR SWEATING *NAUSEA AND VOMITING THAT IS NOT CONTROLLED WITH YOUR NAUSEA MEDICATION *UNUSUAL SHORTNESS OF BREATH *UNUSUAL BRUISING OR BLEEDING *URINARY PROBLEMS (pain or burning when urinating, or frequent urination) *BOWEL PROBLEMS (unusual diarrhea, constipation, pain near the anus) TENDERNESS IN MOUTH AND THROAT WITH OR WITHOUT PRESENCE OF ULCERS (sore throat, sores in mouth, or a toothache) UNUSUAL RASH, SWELLING OR PAIN  UNUSUAL VAGINAL DISCHARGE OR ITCHING   Items with * indicate a potential emergency and should be followed up as soon as possible or go to the Emergency Department if any problems should occur.  Please show the CHEMOTHERAPY ALERT CARD or IMMUNOTHERAPY  ALERT CARD at check-in to the Emergency Department and triage nurse.  Should you have questions after your visit or need to cancel or reschedule your appointment, please contact [facility] - A DEPT OF [facility]  [phone] and follow the prompts.  Office hours are 8:00 a.m. to 4:30 p.m. Monday - Friday. Please note that voicemails left after 4:00 p.m. may not be returned until the following business day.  We are closed weekends and major holidays. You have access to a nurse at all times for urgent questions. Please call the main number to the clinic [phone] and follow the prompts.  For any non-urgent questions, you may also contact your provider using MyChart. We now offer e-Visits for anyone 36 and older to request care online for non-urgent symptoms. For details visit [email or web address].   Also download the MyChart app! Go to the app store, search "MyChart", open the app, select [facility], and log in with your MyChart username and password.

## 2024-06-08 ENCOUNTER — Inpatient Hospital Stay

## 2024-06-08 DIAGNOSIS — Z5111 Encounter for antineoplastic chemotherapy: Secondary | ICD-10-CM | POA: Diagnosis not present

## 2024-06-08 DIAGNOSIS — C8591 Non-Hodgkin lymphoma, unspecified, lymph nodes of head, face, and neck: Secondary | ICD-10-CM

## 2024-06-08 MED ORDER — PEGFILGRASTIM-CBQV 6 MG/0.6ML ~~LOC~~ SOSY
6.0000 mg | PREFILLED_SYRINGE | Freq: Once | SUBCUTANEOUS | Status: AC
  Administered 2024-06-08: 6 mg via SUBCUTANEOUS
  Filled 2024-06-08: qty 0.6

## 2024-06-13 ENCOUNTER — Emergency Department

## 2024-06-13 ENCOUNTER — Other Ambulatory Visit: Payer: Self-pay

## 2024-06-13 ENCOUNTER — Inpatient Hospital Stay
Admission: EM | Admit: 2024-06-13 | Discharge: 2024-06-17 | DRG: 393 | Disposition: A | Discharge status: Home Health | Attending: Internal Medicine | Admitting: Internal Medicine

## 2024-06-13 DIAGNOSIS — Z882 Allergy status to sulfonamides status: Secondary | ICD-10-CM | POA: Diagnosis not present

## 2024-06-13 DIAGNOSIS — R5081 Fever presenting with conditions classified elsewhere: Secondary | ICD-10-CM | POA: Diagnosis present

## 2024-06-13 DIAGNOSIS — Z7951 Long term (current) use of inhaled steroids: Secondary | ICD-10-CM | POA: Diagnosis not present

## 2024-06-13 DIAGNOSIS — Z1152 Encounter for screening for COVID-19: Secondary | ICD-10-CM | POA: Diagnosis not present

## 2024-06-13 DIAGNOSIS — F419 Anxiety disorder, unspecified: Secondary | ICD-10-CM | POA: Diagnosis present

## 2024-06-13 DIAGNOSIS — Z7901 Long term (current) use of anticoagulants: Secondary | ICD-10-CM | POA: Diagnosis not present

## 2024-06-13 DIAGNOSIS — K219 Gastro-esophageal reflux disease without esophagitis: Secondary | ICD-10-CM | POA: Diagnosis present

## 2024-06-13 DIAGNOSIS — J45909 Unspecified asthma, uncomplicated: Secondary | ICD-10-CM | POA: Diagnosis present

## 2024-06-13 DIAGNOSIS — E876 Hypokalemia: Secondary | ICD-10-CM | POA: Diagnosis present

## 2024-06-13 DIAGNOSIS — Z7982 Long term (current) use of aspirin: Secondary | ICD-10-CM | POA: Diagnosis not present

## 2024-06-13 DIAGNOSIS — D709 Neutropenia, unspecified: Principal | ICD-10-CM | POA: Diagnosis present

## 2024-06-13 DIAGNOSIS — D61810 Antineoplastic chemotherapy induced pancytopenia: Secondary | ICD-10-CM | POA: Diagnosis present

## 2024-06-13 DIAGNOSIS — A419 Sepsis, unspecified organism: Secondary | ICD-10-CM

## 2024-06-13 DIAGNOSIS — N1832 Chronic kidney disease, stage 3b: Secondary | ICD-10-CM | POA: Diagnosis present

## 2024-06-13 DIAGNOSIS — T451X5A Adverse effect of antineoplastic and immunosuppressive drugs, initial encounter: Secondary | ICD-10-CM | POA: Diagnosis present

## 2024-06-13 DIAGNOSIS — K6289 Other specified diseases of anus and rectum: Secondary | ICD-10-CM | POA: Diagnosis present

## 2024-06-13 DIAGNOSIS — R350 Frequency of micturition: Secondary | ICD-10-CM | POA: Diagnosis present

## 2024-06-13 DIAGNOSIS — K649 Unspecified hemorrhoids: Secondary | ICD-10-CM | POA: Diagnosis present

## 2024-06-13 DIAGNOSIS — Z8711 Personal history of peptic ulcer disease: Secondary | ICD-10-CM

## 2024-06-13 DIAGNOSIS — C8331 Diffuse large B-cell lymphoma, lymph nodes of head, face, and neck: Secondary | ICD-10-CM | POA: Diagnosis present

## 2024-06-13 DIAGNOSIS — Z86718 Personal history of other venous thrombosis and embolism: Secondary | ICD-10-CM

## 2024-06-13 DIAGNOSIS — E778 Other disorders of glycoprotein metabolism: Secondary | ICD-10-CM | POA: Diagnosis present

## 2024-06-13 DIAGNOSIS — Z888 Allergy status to other drugs, medicaments and biological substances status: Secondary | ICD-10-CM | POA: Diagnosis not present

## 2024-06-13 DIAGNOSIS — C8590 Non-Hodgkin lymphoma, unspecified, unspecified site: Secondary | ICD-10-CM | POA: Diagnosis not present

## 2024-06-13 DIAGNOSIS — Z79899 Other long term (current) drug therapy: Secondary | ICD-10-CM

## 2024-06-13 DIAGNOSIS — F32A Depression, unspecified: Secondary | ICD-10-CM | POA: Diagnosis not present

## 2024-06-13 DIAGNOSIS — Z8616 Personal history of COVID-19: Secondary | ICD-10-CM

## 2024-06-13 DIAGNOSIS — Z9049 Acquired absence of other specified parts of digestive tract: Secondary | ICD-10-CM

## 2024-06-13 DIAGNOSIS — Z880 Allergy status to penicillin: Secondary | ICD-10-CM

## 2024-06-13 DIAGNOSIS — Z95828 Presence of other vascular implants and grafts: Secondary | ICD-10-CM | POA: Diagnosis not present

## 2024-06-13 DIAGNOSIS — Z825 Family history of asthma and other chronic lower respiratory diseases: Secondary | ICD-10-CM

## 2024-06-13 DIAGNOSIS — I959 Hypotension, unspecified: Secondary | ICD-10-CM | POA: Diagnosis present

## 2024-06-13 DIAGNOSIS — I129 Hypertensive chronic kidney disease with stage 1 through stage 4 chronic kidney disease, or unspecified chronic kidney disease: Secondary | ICD-10-CM | POA: Diagnosis present

## 2024-06-13 DIAGNOSIS — M7989 Other specified soft tissue disorders: Secondary | ICD-10-CM | POA: Diagnosis not present

## 2024-06-13 DIAGNOSIS — K5900 Constipation, unspecified: Secondary | ICD-10-CM | POA: Diagnosis present

## 2024-06-13 DIAGNOSIS — N183 Chronic kidney disease, stage 3 unspecified: Secondary | ICD-10-CM | POA: Diagnosis present

## 2024-06-13 DIAGNOSIS — L989 Disorder of the skin and subcutaneous tissue, unspecified: Secondary | ICD-10-CM | POA: Diagnosis present

## 2024-06-13 LAB — CBC WITH DIFFERENTIAL/PLATELET
Abs Immature Granulocytes: 0 K/uL (ref 0.00–0.07)
Basophils Absolute: 0 K/uL (ref 0.0–0.1)
Basophils Relative: 1 %
Eosinophils Absolute: 0 K/uL (ref 0.0–0.5)
Eosinophils Relative: 0 %
HCT: 32.9 % — ABNORMAL LOW (ref 36.0–46.0)
Hemoglobin: 10.9 g/dL — ABNORMAL LOW (ref 12.0–15.0)
Immature Granulocytes: 0 %
Lymphocytes Relative: 55 %
Lymphs Abs: 0.4 K/uL — ABNORMAL LOW (ref 0.7–4.0)
MCH: 30.7 pg (ref 26.0–34.0)
MCHC: 33.1 g/dL (ref 30.0–36.0)
MCV: 92.7 fL (ref 80.0–100.0)
Monocytes Absolute: 0.1 K/uL (ref 0.1–1.0)
Monocytes Relative: 16 %
Neutro Abs: 0.2 K/uL — CL (ref 1.7–7.7)
Neutrophils Relative %: 28 %
Platelets: 57 K/uL — ABNORMAL LOW (ref 150–400)
RBC: 3.55 MIL/uL — ABNORMAL LOW (ref 3.87–5.11)
RDW: 13.2 % (ref 11.5–15.5)
Smear Review: NORMAL
WBC: 0.7 K/uL — CL (ref 4.0–10.5)
nRBC: 0 % (ref 0.0–0.2)

## 2024-06-13 LAB — COMPREHENSIVE METABOLIC PANEL WITH GFR
ALT: 18 U/L (ref 0–44)
AST: 16 U/L (ref 15–41)
Albumin: 3.6 g/dL (ref 3.5–5.0)
Alkaline Phosphatase: 70 U/L (ref 38–126)
Anion gap: 8 (ref 5–15)
BUN: 20 mg/dL (ref 8–23)
CO2: 28 mmol/L (ref 22–32)
Calcium: 8.5 mg/dL — ABNORMAL LOW (ref 8.9–10.3)
Chloride: 97 mmol/L — ABNORMAL LOW (ref 98–111)
Creatinine, Ser: 1.35 mg/dL — ABNORMAL HIGH (ref 0.44–1.00)
GFR, Estimated: 40 mL/min — ABNORMAL LOW (ref >60)
Glucose, Bld: 101 mg/dL — ABNORMAL HIGH (ref 70–99)
Potassium: 3.4 mmol/L — ABNORMAL LOW (ref 3.5–5.1)
Sodium: 133 mmol/L — ABNORMAL LOW (ref 135–145)
Total Bilirubin: 0.5 mg/dL (ref 0.0–1.2)
Total Protein: 6 g/dL — ABNORMAL LOW (ref 6.5–8.1)

## 2024-06-13 LAB — URINALYSIS, W/ REFLEX TO CULTURE (INFECTION SUSPECTED)
Bilirubin Urine: NEGATIVE
Glucose, UA: 50 mg/dL — AB
Hgb urine dipstick: NEGATIVE
Ketones, ur: NEGATIVE mg/dL
Leukocytes,Ua: NEGATIVE
Nitrite: NEGATIVE
Protein, ur: 100 mg/dL — AB
Specific Gravity, Urine: 1.015 (ref 1.005–1.030)
pH: 8 (ref 5.0–8.0)

## 2024-06-13 LAB — TECHNOLOGIST SMEAR REVIEW: Plt Morphology: NORMAL

## 2024-06-13 LAB — RESPIRATORY PANEL BY PCR

## 2024-06-13 LAB — RESP PANEL BY RT-PCR (RSV, FLU A&B, COVID)  RVPGX2
Influenza A by PCR: NEGATIVE
Influenza B by PCR: NEGATIVE
Resp Syncytial Virus by PCR: NEGATIVE
SARS Coronavirus 2 by RT PCR: NEGATIVE

## 2024-06-13 LAB — LACTIC ACID, PLASMA: Lactic Acid, Venous: 1.1 mmol/L (ref 0.5–1.9)

## 2024-06-13 LAB — PROTIME-INR
INR: 1 (ref 0.8–1.2)
Prothrombin Time: 13.8 s (ref 11.4–15.2)

## 2024-06-13 LAB — MAGNESIUM: Magnesium: 2.8 mg/dL — ABNORMAL HIGH (ref 1.7–2.4)

## 2024-06-13 MED ORDER — SODIUM CHLORIDE 0.9 % IV BOLUS
1000.0000 mL | Freq: Once | INTRAVENOUS | Status: AC
  Administered 2024-06-13: 13:00:00 1000 mL via INTRAVENOUS

## 2024-06-13 MED ORDER — VANCOMYCIN HCL IN DEXTROSE 1-5 GM/200ML-% IV SOLN
1000.0000 mg | Freq: Once | INTRAVENOUS | Status: AC
  Administered 2024-06-13: 13:00:00 1000 mg via INTRAVENOUS
  Filled 2024-06-13: qty 200

## 2024-06-13 MED ORDER — METRONIDAZOLE 500 MG/100ML IV SOLN
500.0000 mg | Freq: Once | INTRAVENOUS | Status: AC
  Administered 2024-06-13: 12:00:00 500 mg via INTRAVENOUS
  Filled 2024-06-13: qty 100

## 2024-06-13 MED ORDER — FLUTICASONE FUROATE-VILANTEROL 200-25 MCG/ACT IN AEPB
1.0000 | INHALATION_SPRAY | Freq: Every day | RESPIRATORY_TRACT | Status: DC
  Administered 2024-06-14 – 2024-06-17 (×4): 1 via RESPIRATORY_TRACT
  Filled 2024-06-13: qty 28

## 2024-06-13 MED ORDER — SODIUM CHLORIDE 0.9 % IV BOLUS
500.0000 mL | Freq: Once | INTRAVENOUS | Status: AC
  Administered 2024-06-13: 15:00:00 500 mL via INTRAVENOUS

## 2024-06-13 MED ORDER — TRAZODONE HCL 50 MG PO TABS
50.0000 mg | ORAL_TABLET | Freq: Every day | ORAL | Status: DC
  Administered 2024-06-13 – 2024-06-16 (×4): 50 mg via ORAL
  Filled 2024-06-13 (×4): qty 1

## 2024-06-13 MED ORDER — APIXABAN 5 MG PO TABS
5.0000 mg | ORAL_TABLET | Freq: Two times a day (BID) | ORAL | Status: DC
  Administered 2024-06-13 – 2024-06-17 (×8): 5 mg via ORAL
  Filled 2024-06-13 (×8): qty 1

## 2024-06-13 MED ORDER — SODIUM CHLORIDE 0.9 % IV BOLUS
1000.0000 mL | Freq: Once | INTRAVENOUS | Status: AC
  Administered 2024-06-13: 12:00:00 1000 mL via INTRAVENOUS

## 2024-06-13 MED ORDER — IPRATROPIUM-ALBUTEROL 0.5-2.5 (3) MG/3ML IN SOLN
3.0000 mL | Freq: Four times a day (QID) | RESPIRATORY_TRACT | Status: DC | PRN
  Administered 2024-06-13: 21:00:00 3 mL via RESPIRATORY_TRACT
  Filled 2024-06-13: qty 3

## 2024-06-13 MED ORDER — ACYCLOVIR 200 MG PO CAPS
400.0000 mg | ORAL_CAPSULE | Freq: Every day | ORAL | Status: DC
  Administered 2024-06-13 – 2024-06-17 (×5): 400 mg via ORAL
  Filled 2024-06-13 (×5): qty 2

## 2024-06-13 MED ORDER — ONDANSETRON HCL 4 MG PO TABS: 4.0000 mg | ORAL_TABLET | Freq: Four times a day (QID) | ORAL | Status: DC | PRN

## 2024-06-13 MED ORDER — HYDROCORTISONE ACETATE 25 MG RE SUPP
25.0000 mg | Freq: Two times a day (BID) | RECTAL | Status: DC
  Administered 2024-06-14 – 2024-06-17 (×7): 25 mg via RECTAL
  Filled 2024-06-13 (×8): qty 1

## 2024-06-13 MED ORDER — HYDROXYZINE HCL 10 MG PO TABS
10.0000 mg | ORAL_TABLET | Freq: Every day | ORAL | Status: DC
  Administered 2024-06-13 – 2024-06-16 (×4): 10 mg via ORAL
  Filled 2024-06-13 (×6): qty 1

## 2024-06-13 MED ORDER — ONDANSETRON HCL 4 MG/2ML IJ SOLN
4.0000 mg | Freq: Once | INTRAMUSCULAR | Status: AC
  Administered 2024-06-13: 15:00:00 4 mg via INTRAVENOUS
  Filled 2024-06-13: qty 2

## 2024-06-13 MED ORDER — LACTATED RINGERS IV BOLUS
1000.0000 mL | Freq: Once | INTRAVENOUS | Status: AC
  Administered 2024-06-13: 18:00:00 1000 mL via INTRAVENOUS

## 2024-06-13 MED ORDER — ACETAMINOPHEN 325 MG PO TABS
650.0000 mg | ORAL_TABLET | Freq: Four times a day (QID) | ORAL | Status: DC | PRN
  Administered 2024-06-13: 650 mg via ORAL
  Filled 2024-06-13: qty 2

## 2024-06-13 MED ORDER — ONDANSETRON HCL 4 MG/2ML IJ SOLN: 4.0000 mg | Freq: Four times a day (QID) | INTRAMUSCULAR | Status: DC | PRN

## 2024-06-13 MED ORDER — SODIUM CHLORIDE 0.9% FLUSH
3.0000 mL | Freq: Two times a day (BID) | INTRAVENOUS | Status: DC
  Administered 2024-06-13 – 2024-06-17 (×8): 3 mL via INTRAVENOUS

## 2024-06-13 MED ORDER — IOHEXOL 300 MG/ML  SOLN
100.0000 mL | Freq: Once | INTRAMUSCULAR | Status: AC | PRN
  Administered 2024-06-13: 14:00:00 80 mL via INTRAVENOUS

## 2024-06-13 MED ORDER — POTASSIUM CHLORIDE 20 MEQ PO PACK
60.0000 meq | PACK | Freq: Once | ORAL | Status: AC
  Administered 2024-06-13: 18:00:00 60 meq via ORAL
  Filled 2024-06-13: qty 3

## 2024-06-13 MED ORDER — SODIUM CHLORIDE 0.9 % IV SOLN
2.0000 g | Freq: Once | INTRAVENOUS | Status: AC
  Administered 2024-06-13: 12:00:00 2 g via INTRAVENOUS
  Filled 2024-06-13: qty 10

## 2024-06-13 MED ORDER — ACETAMINOPHEN 650 MG RE SUPP: 650.0000 mg | Freq: Four times a day (QID) | RECTAL | Status: DC | PRN

## 2024-06-13 MED ORDER — PANTOPRAZOLE SODIUM 40 MG PO TBEC
40.0000 mg | DELAYED_RELEASE_TABLET | Freq: Every day | ORAL | Status: DC
  Administered 2024-06-14 – 2024-06-17 (×4): 40 mg via ORAL
  Filled 2024-06-13 (×4): qty 1

## 2024-06-13 MED ORDER — SODIUM CHLORIDE 0.9 % IV SOLN
2.0000 g | Freq: Two times a day (BID) | INTRAVENOUS | Status: DC
  Administered 2024-06-13 – 2024-06-16 (×6): 2 g via INTRAVENOUS
  Filled 2024-06-13 (×7): qty 12.5

## 2024-06-13 MED ORDER — GABAPENTIN 300 MG PO CAPS
600.0000 mg | ORAL_CAPSULE | Freq: Three times a day (TID) | ORAL | Status: DC
  Administered 2024-06-13 – 2024-06-17 (×11): 600 mg via ORAL
  Filled 2024-06-13 (×11): qty 2

## 2024-06-13 MED ORDER — SENNOSIDES-DOCUSATE SODIUM 8.6-50 MG PO TABS: 1.0000 | ORAL_TABLET | Freq: Every evening | ORAL | Status: DC | PRN

## 2024-06-13 MED ORDER — ACETAMINOPHEN 325 MG PO TABS
975.0000 mg | ORAL_TABLET | Freq: Once | ORAL | Status: AC
  Administered 2024-06-13: 12:00:00 975 mg via ORAL
  Filled 2024-06-13: qty 3

## 2024-06-13 MED ORDER — METRONIDAZOLE 500 MG/100ML IV SOLN
500.0000 mg | Freq: Two times a day (BID) | INTRAVENOUS | Status: DC
  Administered 2024-06-13 – 2024-06-15 (×5): 500 mg via INTRAVENOUS
  Filled 2024-06-13 (×6): qty 100

## 2024-06-13 MED ORDER — LIDOCAINE 4 % EX CREA
TOPICAL_CREAM | Freq: Once | CUTANEOUS | Status: AC
  Filled 2024-06-13 (×2): qty 5

## 2024-06-13 NOTE — ED Notes (Signed)
 Patient given dinner tray. Patient took a few bites and then stated she does not have an appetite at this time. Tray remains at bedside for patient to access if hungry.

## 2024-06-13 NOTE — H&P (Addendum)
 History and Physical    Erin [name] FMW:[id number] DOB: 1945/11/18 DOA: 06/13/2024  DOS: the patient was seen and examined on 06/13/2024  PCP: [facility] [place]   Patient coming from: Home  I have personally briefly reviewed patient's old medical records in [facility] and [facility]where  HPI:   Erin [name] is a 78 y.o. year old female with medical history of hypertension, recent diagnosis of lymphoma R-CHOP, asthma, CKDIIIb presented to the ED with rectal pain, dizziness and fevers.  Patient reports she has not been eating well for about 1 week.  She reports this is secondary to nausea along with decreased appetite.  Over the last few days he has been more weak to where it is difficult to ambulate.  She states she has been getting dizzy with movement.  She denies any other acute complaints but does state that she is constipated and has been having rectal pain more so in the last week.  She states she lives alone but her daughter Amy checks on her.  On arrival to the ED patient was noted to be HDS stable.  Lab work and imaging obtained.  CBC with severe leukopenia at 0.7 with absolute neutrophil count at 200.  Mild to moderate anemia compared to baseline at 10.9 with baseline around 13.  Thrombocytopenia 57K.  UA without signs of infection.  CMP shows multiple electrolyte abnormalities and stable renal function along with hypoproteinemia.  Respiratory panel negative for COVID, flu, RSV.  Lactic acid normal at 1.1.  Chest x-ray without any acute findings.  CT abdomen pelvis showing asymmetric wall thickening and mucosal enhancement along the rectum concerning for proctitis versus neoplasm.  Initial blood pressure significantly hypotensive with 70s/40s.  Patient given IVF with good response with repeat blood pressure 100/50s.  Given patient's presentation, [facility] contacted for admission.  Review of Systems: As mentioned in the history of present illness. All  other systems reviewed and are negative.   Past Medical History:  Diagnosis Date   Acute URI    Allergy    Anxiety    Asthma    well controlled   Cancer (HCC)    lymphoma in remission   Diverticulosis    DVT of upper extremity (deep vein thrombosis) (HCC) 07/2008 & 12/2007   Gastric ulcer 10/2007   EGD   GERD (gastroesophageal reflux disease)    Hepatitis B    treated   History of hiatal hernia    Hypertension    Laboratory confirmed diagnosis of COVID-19 06/27/2019   Lower extremity edema    Lymphoma (HCC)    Stage 3 large B cell lymphoma    Past Surgical History:  Procedure Laterality Date   ANTERIOR AND POSTERIOR REPAIR N/A 03/17/2020   Procedure: ANTERIOR (CYSTOCELE) AND POSTERIOR REPAIR (RECTOCELE);  Surgeon: Connell Davies, MD;  Location: [facility] ORS;  Service: Gynecology;  Laterality: N/A;   CHOLECYSTECTOMY     COLONOSCOPY  11/23/2007, 05/12/1994   COLONOSCOPY WITH PROPOFOL  N/A 03/31/2018   Procedure: COLONOSCOPY WITH PROPOFOL ;  Surgeon: Viktoria Lamar DASEN, MD;  Location[facility] ENDOSCOPY;  Service: Endoscopy;  Laterality: N/A;   ESOPHAGOGASTRODUODENOSCOPY  09/03/2009, 10/14/2008, 09/30/2008, 11/23/2007   HEMORRHOID SURGERY     LAPAROSCOPIC VAGINAL HYSTERECTOMY WITH SALPINGO OOPHORECTOMY Bilateral 03/17/2020   Procedure: LAPAROSCOPIC ASSISTED VAGINAL HYSTERECTOMY WITH SALPINGO OOPHORECTOMY;  Surgeon: Connell Davies, MD;  Location: [facility] ORS;  Service: Gynecology;  Laterality: Bilateral;   LIVER BIOPSY  01/27/1996   PORTOCAVAL SHUNT PLACEMENT  and removal   PUBOVAGINAL SLING N/A 03/17/2020   Procedure: PUBO-VAGINAL SLING-TVT;  Surgeon: Connell Davies, MD;  Location: [facility] ORS;  Service: Gynecology;  Laterality: N/A;     Allergies[1]  Family History  Problem Relation Age of Onset   Cancer Daughter    Asthma Mother    Breast cancer Neg Hx     Prior to Admission medications  Medication Sig Start Date End Date Taking? Authorizing Provider  acetaminophen  (TYLENOL ) 325 MG tablet  Take 325-650 mg by mouth every 6 (six) hours as needed for moderate pain or headache.   Yes [provider]  acyclovir  (ZOVIRAX ) 400 MG tablet Take 1 tablet (400 mg total) by mouth daily. 06/01/24  Yes Brahmanday, Govinda R, MD  albuterol  (PROVENTIL  HFA;VENTOLIN  HFA) 108 (90 Base) MCG/ACT inhaler Inhale 1-2 puffs into the lungs every 6 (six) hours as needed for wheezing or shortness of breath. 10/08/17  Yes Salary, Nemiah BIRCH, MD  allopurinol  (ZYLOPRIM ) 300 MG tablet Take 1 tablet (300 mg total) by mouth daily. 06/01/24  Yes Brahmanday, Govinda R, MD  aspirin  EC 81 MG tablet Take 81 mg by mouth daily. Swallow whole.   Yes [provider]  Biotin  5 MG CAPS Take 5 mg by mouth daily.   Yes [provider]  Calcium  Carb-Cholecalciferol  (CALCIUM  600 + D PO) Take 1 tablet by mouth daily.   Yes [provider]  Carboxymethylcellulose Sodium (LUBRICANT EYE DROPS OP) Place 1 drop into both eyes daily as needed (dry eyes).   Yes [provider]  citalopram  (CELEXA ) 40 MG tablet Take 40 mg by mouth every morning.  04/30/19  Yes [provider]  Cyanocobalamin  (B-12) 2500 MCG TABS Take 2,500 mcg by mouth daily.   Yes [provider]  diphenhydrAMINE  (BENADRYL ) 25 MG tablet Take 25 mg by mouth at bedtime as needed for allergies.   Yes [provider]  furosemide  (LASIX ) 20 MG tablet Take 20 mg by mouth daily. 07/22/18  Yes [provider]  gabapentin  (NEURONTIN ) 600 MG tablet Take 600 mg by mouth 3 (three) times daily.  12/26/18  Yes [provider]  hydrocortisone  2.5 % cream Apply 1 Application topically 2 (two) times daily. 01/12/24  Yes [provider]  hydrOXYzine  (ATARAX ) 10 MG tablet Take 10 mg by mouth at bedtime. 01/12/24  Yes [provider]  lidocaine  (LIDODERM ) 5 % Place 1 patch onto the skin daily as needed. 12/02/22  Yes [provider]  mometasone -formoterol  (DULERA ) 200-5 MCG/ACT AERO Inhale 2  puffs into the lungs 2 (two) times daily. 10/05/18  Yes Sherial Bail, MD  Multiple Vitamin (MULTIVITAMIN WITH MINERALS) TABS tablet Take 1 tablet by mouth daily.   Yes [provider]  omeprazole (PRILOSEC) 20 MG capsule Take 20 mg by mouth 2 (two) times daily.   Yes [provider]  tiZANidine  (ZANAFLEX ) 2 MG tablet Take 2 mg by mouth 3 (three) times daily as needed for muscle spasms.    Yes [provider]  traZODone  (DESYREL ) 100 MG tablet Take 50 mg by mouth at bedtime. 11/30/23  Yes [provider]  albuterol  (PROVENTIL ) (2.5 MG/3ML) 0.083% nebulizer solution Take 2.5 mg by nebulization every 6 (six) hours as needed for wheezing or shortness of breath. Patient not taking: Reported on 06/13/2024    [provider]  alendronate  (FOSAMAX ) 70 MG tablet Take 70 mg by mouth every Friday.  Patient not taking: Reported on 06/13/2024 03/26/15   [provider]  apixaban  (ELIQUIS )  5 MG TABS tablet Take 5 mg by mouth 2 (two) times daily. Patient not taking: Reported on 06/13/2024    [provider]  ferrous sulfate  (FERROUSUL) 325 (65 FE) MG tablet Take 1 tablet (325 mg total) by mouth daily with breakfast. Patient not taking: Reported on 06/13/2024 03/18/20   Connell Davies, MD  montelukast  (SINGULAIR ) 10 MG tablet Take 1 tablet (10 mg total) by mouth at bedtime. Patient not taking: Reported on 06/13/2024 10/13/20   Brahmanday, Govinda R, MD  ondansetron  (ZOFRAN ) 8 MG tablet Take 1 tablet (8 mg total) by mouth every 8 (eight) hours as needed for nausea or vomiting. Start on third day after cyclophosphamide  chemotherapy. Patient not taking: Reported on 06/13/2024 06/01/24   [name], MD  potassium chloride  SA (KLOR-CON ) 20 MEQ tablet Take 1 tablet (20 mEq total) by mouth daily. Patient not taking: Reported on 06/13/2024 06/16/20   Brahmanday, Govinda R, MD  predniSONE  (DELTASONE ) 50 MG tablet Take 2 tablets (100 mg total) by mouth  daily. Take on days 1-5 of chemotherapy. Patient not taking: Reported on 06/13/2024 06/01/24   Brahmanday, Govinda R, MD  traMADol  (ULTRAM ) 50 MG tablet Take 1 tablet (50 mg total) by mouth every 12 (twelve) hours as needed. Patient not taking: Reported on 06/13/2024 01/21/24   Laurita Pillion, MD    Social History:  reports that she has never smoked. She has never used smokeless tobacco. She reports that she does not drink alcohol and does not use drugs. Lives by herself.  Denies smoking.  Denies alcohol use.  Is independent in ADLs but needs assistance with IADLs.   Physical Exam: Vitals:   06/13/24 1608 06/13/24 1630 06/13/24 1715 06/13/24 1916  BP:  (!) 99/51 104/88   Pulse:  87 98   Resp:  20 14   Temp: 99.1 F (37.3 C)   99.2 F (37.3 C)  TempSrc: Oral   Oral  SpO2:  100% 100%   Weight:      Height:        Gen: NAD HENT: NCAT, dry MM CV: normal heart sounds Lung: CTAB Abd: No TTP, normal bowel sounds Rectal: deferred per pt request MSK: No asymmetry, good bulk and tone Neuro: alert and oriented   Labs on Admission: I have personally reviewed following labs and imaging studies  CBC: Recent Labs  Lab 06/13/24 1155  WBC 0.7*  NEUTROABS 0.2*  HGB 10.9*  HCT 32.9*  MCV 92.7  PLT 57*   Basic Metabolic Panel: Recent Labs  Lab 06/13/24 1155  NA 133*  K 3.4*  CL 97*  CO2 28  GLUCOSE 101*  BUN 20  CREATININE 1.35*  CALCIUM  8.5*   GFR: Estimated Creatinine Clearance: 33.2 mL/min (A) (by C-G formula based on SCr of 1.35 mg/dL (H)). Liver Function Tests: Recent Labs  Lab 06/13/24 1155  AST 16  ALT 18  ALKPHOS 70  BILITOT 0.5  PROT 6.0*  ALBUMIN 3.6   No results for input(s): LIPASE, AMYLASE in the last 168 hours. No results for input(s): AMMONIA in the last 168 hours. Coagulation Profile: Recent Labs  Lab 06/13/24 1155  INR 1.0   Cardiac Enzymes: No results for input(s): CKTOTAL, CKMB, CKMBINDEX, TROPONINI, TROPONINIHS in the  last 168 hours. BNP (last 3 results) No results for input(s): BNP in the last 8760 hours. HbA1C: No results for input(s): HGBA1C in the last 72 hours. CBG: No results for input(s): GLUCAP in the last 168 hours. Lipid Profile: No results for  input(s): CHOL, HDL, LDLCALC, TRIG, CHOLHDL, LDLDIRECT in the last 72 hours. Thyroid Function Tests: No results for input(s): TSH, T4TOTAL, FREET4, T3FREE, THYROIDAB in the last 72 hours. Anemia Panel: No results for input(s): VITAMINB12, FOLATE, FERRITIN, TIBC, IRON , RETICCTPCT in the last 72 hours. Urine analysis:    Component Value Date/Time   COLORURINE YELLOW (A) 06/13/2024 1156   APPEARANCEUR CLOUDY (A) 06/13/2024 1156   APPEARANCEUR Clear 10/25/2013 1316   LABSPEC 1.015 06/13/2024 1156   LABSPEC 1.016 10/25/2013 1316   PHURINE 8.0 06/13/2024 1156   GLUCOSEU 50 (A) 06/13/2024 1156   GLUCOSEU Negative 10/25/2013 1316   HGBUR NEGATIVE 06/13/2024 1156   BILIRUBINUR NEGATIVE 06/13/2024 1156   BILIRUBINUR neg 02/06/2020 1149   BILIRUBINUR Negative 10/25/2013 1316   KETONESUR NEGATIVE 06/13/2024 1156   PROTEINUR 100 (A) 06/13/2024 1156   UROBILINOGEN 0.2 02/06/2020 1149   NITRITE NEGATIVE 06/13/2024 1156   LEUKOCYTESUR NEGATIVE 06/13/2024 1156   LEUKOCYTESUR Trace 10/25/2013 1316    Radiological Exams on Admission: I have personally reviewed images CT ABDOMEN PELVIS W CONTRAST Result Date: 06/13/2024 CLINICAL DATA:  sepsis, rectal pain EXAM: CT ABDOMEN AND PELVIS WITH CONTRAST TECHNIQUE: Multidetector CT imaging of the abdomen and pelvis was performed using the standard protocol following bolus administration of intravenous contrast. RADIATION DOSE REDUCTION: This exam was performed according to the departmental dose-optimization program which includes automated exposure control, adjustment of the mA and/or kV according to patient size and/or use of iterative reconstruction technique. CONTRAST:  80mL  OMNIPAQUE  IOHEXOL  300 MG/ML  SOLN COMPARISON:  May 22, 2024, April 27, 2024 FINDINGS: Lower chest: No focal airspace consolidation or pleural effusion.Multifocal subsegmental atelectasis in the lung bases. Hepatobiliary: No mass.No radiopaque stones or wall thickening of the gallbladder. Mild dilation of the common bile duct, likely related to the prior cholecystectomy. The portal veins are patent. Pancreas: No mass or main ductal dilation. No peripancreatic inflammation or fluid collection. Spleen: Normal size. No mass. Adrenals/Urinary Tract: No adrenal masses. Subcentimeter hypodensity noted in the left kidney, too small to definitively characterize, but likely a small cyst. No nephrolithiasis or hydronephrosis. The urinary bladder is distended without wall thickening. Curvilinear hypodensity layering dependently within the urinary bladder (axial 79). Stomach/Bowel: The stomach is decompressed without focal abnormality. No small bowel wall thickening or inflammation. No small bowel obstruction.Normal appendix. Descending and sigmoid colonic diverticulosis. No changes of acute diverticulitis. Asymmetric wall thickening and enhancement along the rectum/anal canal. Vascular/Lymphatic: No aortic aneurysm. Scattered aortoiliac atherosclerosis. No intraabdominal or pelvic lymphadenopathy. Reproductive: Hysterectomy. No concerning adnexal mass.No free pelvic fluid. Findings consistent with pelvic floor laxity. Other: No pneumoperitoneum, ascites, or mesenteric inflammation. Musculoskeletal: No acute fracture or destructive lesion. Osteopenia. Mild grade 1 anterolisthesis of L4 on L5. Multilevel degenerative disc disease of the spine. Thoracic DISH. IMPRESSION: 1. Asymmetric wall thickening and mucosal enhancement along the rectum/anal canal, which may represent changes of proctitis or neoplasm. Correlation with physical exam findings recommended. 2. Curvilinear hypodensity layering dependently within the urinary  bladder (axial 79), which may represent mixing artifact from excreted contrast. Correlation with urinalysis recommended to exclude blood products or infectious debris. 3. Descending and sigmoid colonic diverticulosis. No changes of acute diverticulitis. 4. Findings consistent with pelvic floor laxity. Aortic Atherosclerosis (ICD10-I70.0). Electronically Signed   By: Rogelia Myers M.D.   On: 06/13/2024 15:10   DG Chest Port 1 View Result Date: 06/13/2024 CLINICAL DATA:  Questionable sepsis - evaluate for abnormality EXAM: PORTABLE CHEST - 1 VIEW COMPARISON:  January 14, 2024, May 22, 2024 FINDINGS: Subsegmental atelectasis in the left lung base. No focal airspace consolidation, pleural effusion, or pneumothorax. No cardiomegaly. Right chest port is similarly positioned terminating at the cavoatrial junction. Tortuous aorta with aortic atherosclerosis. No acute fracture or destructive lesions. Multilevel thoracic osteophytosis. Remote, healed rib fractures. Osteoarthritis of both glenohumeral joints. IMPRESSION: No acute cardiopulmonary abnormality. Electronically Signed   By: Rogelia Myers M.D.   On: 06/13/2024 14:00    EKG: My personal interpretation of EKG shows: Normal sinus rhythm without any acute ST changes.  Assessment/Plan Principal Problem:   Neutropenic fever Active Problems:   GERD without esophagitis   Anxiety and depression   Lymphoma (HCC)   CKD (chronic kidney disease) stage 3, GFR 30-59 ml/min (HCC)   Chronic asthma   Patient with history of lymphoma in 2009 that was previously in remission with recurrence in 2015 and then 2025.  Patient recently started on R-CHOP with plan for chemotherapy q. 21 days x 3 cycles.  Chemotherapy started on 12/08.  Patient's presentation today with severe neutropenia along with fevers admitted for neutropenic fever.  She has received multiple antibiotics in the ED but I will transition her to cefepime .  Per up-to-date cefepime  can be used as  monotherapy for neutropenic fever.  No exact source of infection identified.  She has proctitis which may be inflammatory in nature.  Blood cultures have been obtained.  Given her complaint of rhinorrhea, expanded respiratory panel ordered.  Will monitor cultures, trend fever curve and monitor leukocyte count.  Proctitis: likely this is secondary to chemotherapy. Will treat with suppository daily.   Thrombocytopenia: Likely secondary to chemotherapy.  Smear pending.  Will get IPF.  GERD: Chronic problem.  Continue home PPI  CKD 3B: Currently at baseline.  Will continue to monitor.  Renally dose medications and avoid nephrotoxic agents.  Hypokalemia: Mild at 3.4.  Likely secondary to poor oral intake.  Repleted with 60 mill equivalents.  Magnesium  lvl ordered.  Asthma: Not in exacerbation.  Resume home inhalers.   VTE prophylaxis:  Eliquis   Diet: N.p.o. Code Status:  Full Code Telemetry:  Admission status: Inpatient, Step Down Unit Patient is from: Home Anticipated d/c is to: Home Anticipated d/c is in: 2-3 days   Family Communication: Updated at bedside  Consults called: Dr. Babara with oncology, Dr. Epifanio with ID.   Severity of Illness: The appropriate patient status for this patient is INPATIENT. Inpatient status is judged to be reasonable and necessary in order to provide the required intensity of service to ensure the patient's safety. The patient's presenting symptoms, physical exam findings, and initial radiographic and laboratory data in the context of their chronic comorbidities is felt to place them at high risk for further clinical deterioration. Furthermore, it is not anticipated that the patient will be medically stable for discharge from the hospital within 2 midnights of admission.   * I certify that at the point of admission it is my clinical judgment that the patient will require inpatient hospital care spanning beyond 2 midnights from the point of admission due to  high intensity of service, high risk for further deterioration and high frequency of surveillance required.DEWAINE Morene Bathe, MD [facility] [place]      [1]  Allergies Allergen Reactions   Penicillins Rash    Tolerates cephalosporin   Prednisone  Hives   Sulfa Antibiotics Hives and Shortness Of Breath   Enalapril     Hypotension    Medrol  [Methylprednisolone ] Rash

## 2024-06-13 NOTE — ED Notes (Signed)
 This RN changed provided pericare after pt had a episode of stool incontinent. Full linen change provided. Brief applied. Pt tolerated well. Warm blanket given.

## 2024-06-13 NOTE — ED Notes (Signed)
 This RN spoke with the pt's daughter Amy to up date

## 2024-06-13 NOTE — ED Provider Notes (Signed)
 [facility] Provider Note    Event Date/Time   First MD Initiated Contact with Patient 06/13/24 1140     (approximate)   History   Dizziness and Rectal Pain   HPI  Erin [name] is a 78 y.o. female who presents with concern of fever hypotension and weakness.  Apparently she has been dealing with hemorrhoidal pain for about 1 month.  Over last few days patient has not been feeling so well, complaining of increased body aches and fatigue.  Denies cough congestion, she has chronic urinary frequency, which has not changed recently.  No associate abdominal pain nausea vomiting or any other symptoms.  EMS found her to be hypotensive give her 500 cc of fluid and route.  Aside from the rectal pain patient without other complaints.  Has not been taking any new medications at home.  It seems that she is being treated for lymphoma.  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    Physical Exam   Triage Vital Signs: ED Triage Vitals  Encounter Vitals Group     BP      Girls Systolic BP Percentile      Girls Diastolic BP Percentile      Boys Systolic BP Percentile      Boys Diastolic BP Percentile      Pulse      Resp      Temp      Temp src      SpO2      Weight      Height      Head Circumference      Peak Flow      Pain Score      Pain Loc      Pain Education      Exclude from Growth Chart     Most recent vital signs: Vitals:   06/13/24 1430 06/13/24 1521  BP: 106/74 (!) 76/45  Pulse:    Resp:    Temp:    SpO2:       General: Awake, no distress.  CV:  Good peripheral perfusion.  Resp:  Normal effort.  Abd:  No distention.  Soft nontender Other:     ED Results / Procedures / Treatments   Labs (all labs ordered are listed, but only abnormal results are displayed) Labs Reviewed  COMPREHENSIVE METABOLIC PANEL WITH GFR - Abnormal; Notable for the following components:      Result Value    Sodium 133 (*)    Potassium 3.4 (*)    Chloride 97 (*)    Glucose, Bld 101 (*)    Creatinine, Ser 1.35 (*)    Calcium  8.5 (*)    Total Protein 6.0 (*)    GFR, Estimated 40 (*)    All other components within normal limits  CBC WITH DIFFERENTIAL/PLATELET - Abnormal; Notable for the following components:   WBC 0.7 (*)    RBC 3.55 (*)    Hemoglobin 10.9 (*)    HCT 32.9 (*)    Platelets 57 (*)    Neutro Abs 0.2 (*)    Lymphs Abs 0.4 (*)    All other components within normal limits  URINALYSIS, W/ REFLEX TO CULTURE (INFECTION SUSPECTED) - Abnormal; Notable for the following components:   Color, Urine YELLOW (*)    APPearance CLOUDY (*)    Glucose, UA 50 (*)    Protein, ur 100 (*)    Bacteria, UA RARE (*)  All other components within normal limits  RESP PANEL BY RT-PCR (RSV, FLU A&B, COVID)  RVPGX2  CULTURE, BLOOD (ROUTINE X 2)  CULTURE, BLOOD (ROUTINE X 2)  LACTIC ACID, PLASMA  PROTIME-INR     EKG  On my independent interpretation of this EKG sinus rhythm with rate of about 90, axis of 15, intervals appear to be within normal limits, no obvious ischemia that I appreciate on this EKG   RADIOLOGY No acute cardiopulmonary findings  PROCEDURES:  Critical Care performed: Yes, see critical care procedure note(s)  .Critical Care  Performed by: Fernand Rossie HERO, MD Authorized by: Fernand Rossie HERO, MD   Critical care provider statement:    Critical care time (minutes):  55   Critical care was necessary to treat or prevent imminent or life-threatening deterioration of the following conditions:  Circulatory failure, sepsis and shock   Critical care was time spent personally by me on the following activities:  Ordering and performing treatments and interventions, ordering and review of laboratory studies, ordering and review of radiographic studies, pulse oximetry, re-evaluation of patient's condition, review of old charts, obtaining history from patient or surrogate, examination of  patient and evaluation of patient's response to treatment    MEDICATIONS ORDERED IN ED: Medications  sodium chloride  0.9 % bolus 500 mL (has no administration in time range)  acetaminophen  (TYLENOL ) tablet 975 mg (975 mg Oral Given 06/13/24 1221)  sodium chloride  0.9 % bolus 1,000 mL (0 mLs Intravenous Stopped 06/13/24 1506)  aztreonam  (AZACTAM ) 2 g in sodium chloride  0.9 % 100 mL IVPB (0 g Intravenous Stopped 06/13/24 1249)  metroNIDAZOLE  (FLAGYL ) IVPB 500 mg (0 mg Intravenous Stopped 06/13/24 1326)  vancomycin  (VANCOCIN ) IVPB 1000 mg/200 mL premix (1,000 mg Intravenous New Bag/Given 06/13/24 1319)  lidocaine  (LMX) 4 % cream ( Topical Given 06/13/24 1320)  sodium chloride  0.9 % bolus 1,000 mL (1,000 mLs Intravenous New Bag/Given 06/13/24 1324)  iohexol  (OMNIPAQUE ) 300 MG/ML solution 100 mL (80 mLs Intravenous Contrast Given 06/13/24 1352)  ondansetron  (ZOFRAN ) injection 4 mg (4 mg Intravenous Given 06/13/24 1440)     IMPRESSION / MDM / ASSESSMENT AND PLAN / ED COURSE  I reviewed the triage vital signs and the nursing notes.                               Patient's presentation is most consistent with acute presentation with potential threat to life or bodily function.  78 year old female who presents today with concern of weakness.  Found hypotensive and febrile.  Given about 500 cc and route continues to be hypotensive here.  Will treat fever and hypotension, initiate septic workup and start broad-spectrum antibiotics especially given her outpatient cancer treatment.  No focal signs or symptoms of infection, unfortunately in the hallway bed right now we will have to have her moved to a more private room so we can perform rectal exam to assess further for hemorrhoids versus need for imaging for concern of a deep space infection.  Fortunately her abdominal exam is benign and my suspicion for this is low.   Clinical Course as of 06/13/24 1526  Wed Jun 13, 2024  1225 Large hemorrhoid  appreciate on the rectal exam which is fairly tender to touch, surrounding area does not appear to be erythematous and without evidence of significant infection.  Regardless given fever no clear source at this time and also the underlying chemotherapy that she is on, will be reasonable  to obtain CT imaging of the abdomen pelvis to further assess and rule out a deep space infection.  Will apply lidocaine  cream to the area of the hemorrhoid. [SK]  1314 White count of 0.7 and ANC of 0.2, consistent and concerning for neutropenic fever.  Awaiting results of CT imaging at this time, she is already been initiated on broad-spectrum antibiotics.  Will need admission to medicine service.  COVID flu swabbing is all negative here. [SK]  1320 Patient remains hypotensive here, will give a second liter of fluids [SK]  1514 CT imaging showing primarily concern of possible proctitis versus malignancy.  Given the clinical exam higher concern for proctitis.  Will reach out to medicine for admission at this time for management of neutropenic fever. [SK]  1521 Patient blood pressure currently in the 70s systolically, will place a pressure bag of fluids and continue to monitor blood pressure.  If she continues to be hypotensive may warrant pressors.  Echo from July of this year shows an EF of about 60 to 65%. [SK]    Clinical Course User Index [SK] Fernand Rossie HERO, MD     FINAL CLINICAL IMPRESSION(S) / ED DIAGNOSES   Final diagnoses:  Neutropenic fever  Proctitis     Rx / DC Orders   ED Discharge Orders     None        Note:  This document was prepared using Dragon voice recognition software and may include unintentional dictation errors.   Fernand Rossie HERO, MD 06/13/24 [phone]

## 2024-06-13 NOTE — ED Triage Notes (Signed)
 Pt arrives via ems from home, she is independent living at [facility] and states that her daughter helps her get her groceries. Pt states that she has been having rectal pain for the past month, that has worsened recently, pt also c/o dizziness and unaware that she was febrile, ems stated that they gave her a bolus due to her bp dropping to 93/38 for them

## 2024-06-13 NOTE — ED Provider Notes (Signed)
 Procedures  Clinical Course as of 06/13/24 1701  Wed Jun 13, 2024  1225 Large hemorrhoid appreciate on the rectal exam which is fairly tender to touch, surrounding area does not appear to be erythematous and without evidence of significant infection.  Regardless given fever no clear source at this time and also the underlying chemotherapy that she is on, will be reasonable to obtain CT imaging of the abdomen pelvis to further assess and rule out a deep space infection.  Will apply lidocaine  cream to the area of the hemorrhoid. [SK]  1314 White count of 0.7 and ANC of 0.2, consistent and concerning for neutropenic fever.  Awaiting results of CT imaging at this time, she is already been initiated on broad-spectrum antibiotics.  Will need admission to medicine service.  COVID flu swabbing is all negative here. [SK]  1320 Patient remains hypotensive here, will give a second liter of fluids [SK]  1514 CT imaging showing primarily concern of possible proctitis versus malignancy.  Given the clinical exam higher concern for proctitis.  Will reach out to medicine for admission at this time for management of neutropenic fever. [SK]  1521 Patient blood pressure currently in the 70s systolically, will place a pressure bag of fluids and continue to monitor blood pressure.  If she continues to be hypotensive may warrant pressors.  Echo from July of this year shows an EF of about 60 to 65%. [SK]    Clinical Course User Index [SK] Fernand Rossie HERO, MD    ----------------------------------------- 5:00 PM on 06/13/2024 ----------------------------------------- Sepsis reassessment completed.  Blood pressure 100/55, MAP of 70.  Heart rate 95.  Lactate normal.  No refractory septic shock, vasopressors not indicated.  Will consult hospitalist for admission.  Final diagnoses:  Neutropenic fever  Proctitis  Septic shock (HCC)        Viviann Pastor, MD 06/13/24 1701

## 2024-06-13 NOTE — ED Notes (Signed)
 CCMD called to admit patient to cardiac monitor.

## 2024-06-14 ENCOUNTER — Encounter: Payer: Self-pay | Admitting: Internal Medicine

## 2024-06-14 ENCOUNTER — Telehealth (HOSPITAL_COMMUNITY): Payer: Self-pay

## 2024-06-14 ENCOUNTER — Other Ambulatory Visit (HOSPITAL_COMMUNITY): Payer: Self-pay

## 2024-06-14 DIAGNOSIS — R5081 Fever presenting with conditions classified elsewhere: Secondary | ICD-10-CM | POA: Diagnosis not present

## 2024-06-14 DIAGNOSIS — F419 Anxiety disorder, unspecified: Secondary | ICD-10-CM | POA: Diagnosis not present

## 2024-06-14 DIAGNOSIS — C8590 Non-Hodgkin lymphoma, unspecified, unspecified site: Secondary | ICD-10-CM | POA: Diagnosis not present

## 2024-06-14 DIAGNOSIS — Z880 Allergy status to penicillin: Secondary | ICD-10-CM | POA: Diagnosis not present

## 2024-06-14 DIAGNOSIS — F32A Depression, unspecified: Secondary | ICD-10-CM | POA: Diagnosis not present

## 2024-06-14 DIAGNOSIS — K219 Gastro-esophageal reflux disease without esophagitis: Secondary | ICD-10-CM | POA: Diagnosis not present

## 2024-06-14 DIAGNOSIS — D709 Neutropenia, unspecified: Secondary | ICD-10-CM | POA: Diagnosis not present

## 2024-06-14 DIAGNOSIS — N1832 Chronic kidney disease, stage 3b: Secondary | ICD-10-CM

## 2024-06-14 LAB — IMMATURE PLATELET FRACTION: Immature Platelet Fraction: 4.4 % (ref 1.2–8.6)

## 2024-06-14 LAB — CBC
HCT: 27.6 % — ABNORMAL LOW (ref 36.0–46.0)
Hemoglobin: 9 g/dL — ABNORMAL LOW (ref 12.0–15.0)
MCH: 31.6 pg (ref 26.0–34.0)
MCHC: 32.6 g/dL (ref 30.0–36.0)
MCV: 96.8 fL (ref 80.0–100.0)
Platelets: 52 K/uL — ABNORMAL LOW (ref 150–400)
RBC: 2.85 MIL/uL — ABNORMAL LOW (ref 3.87–5.11)
RDW: 13.7 % (ref 11.5–15.5)
WBC: 1.6 K/uL — ABNORMAL LOW (ref 4.0–10.5)
nRBC: 0 % (ref 0.0–0.2)

## 2024-06-14 LAB — BASIC METABOLIC PANEL WITH GFR
Anion gap: 7 (ref 5–15)
BUN: 19 mg/dL (ref 8–23)
CO2: 24 mmol/L (ref 22–32)
Calcium: 7.5 mg/dL — ABNORMAL LOW (ref 8.9–10.3)
Chloride: 108 mmol/L (ref 98–111)
Creatinine, Ser: 1.15 mg/dL — ABNORMAL HIGH (ref 0.44–1.00)
GFR, Estimated: 49 mL/min — ABNORMAL LOW (ref >60)
Glucose, Bld: 78 mg/dL (ref 70–99)
Potassium: 3.5 mmol/L (ref 3.5–5.1)
Sodium: 139 mmol/L (ref 135–145)

## 2024-06-14 LAB — C DIFFICILE QUICK SCREEN W PCR REFLEX
C Diff antigen: NEGATIVE
C Diff interpretation: NOT DETECTED
C Diff toxin: NEGATIVE

## 2024-06-14 LAB — APTT: aPTT: 37 s — ABNORMAL HIGH (ref 24–36)

## 2024-06-14 LAB — CBG MONITORING, ED: Glucose-Capillary: 75 mg/dL (ref 70–99)

## 2024-06-14 LAB — FIBRINOGEN: Fibrinogen: 677 mg/dL — ABNORMAL HIGH (ref 210–475)

## 2024-06-14 MED ORDER — LACTATED RINGERS IV SOLN: INTRAVENOUS | Status: AC

## 2024-06-14 NOTE — Progress Notes (Signed)
 OT Cancellation Note  Patient Details Name: Erin [name] MRN: [id number] DOB: March 08, 1946   Cancelled Treatment:    Reason Eval/Treat Not Completed: Fatigue/lethargy limiting ability to participate. Pt appearing generally unwell, with nursing staff for care following R IV removal. BP additionally noted to be low. OT will continue to follow and see at later date/time as appropriate.   Bliss Behnke L. Myrla Malanowski, OTR/L  06/14/2024, 9:15 AM

## 2024-06-14 NOTE — Consult Note (Signed)
 Infectious Disease     Reason for Consult:NTP fever   Referring Physician: Dezii, Alexandra, DO  Date of Admission:  06/13/2024   Principal Problem:   Neutropenic fever Active Problems:   Lymphoma (HCC)   CKD (chronic kidney disease) stage 3, GFR 30-59 ml/min (HCC)   GERD without esophagitis   Anxiety and depression   Chronic asthma   HPI: Erin [name] is a 78 y.o. female with a history of hypertension chronic kidney disease asthma recent diagnosis of lymphoma on R-CHOP admitted with nausea decreased appetite neutropenic fever.  On admission temperature was 103.1.  White blood count was 1.6 hemoglobin 9.0 and platelets 52.  Creatinine was elevated at 1.35 LFTs were normal lactic acid 1.1.  Neutrophil count was 0.2.  She had negative COVID flu and RSV testing.  Full respiratory panel was negative as well urine sample showed 0-5 whites. Imaging was done with a chest x-ray showing no acute cardiopulmonary abnormalities.  She does have a port on her chest wall on the right.  She had a CT of her abdomen and pelvis with asymmetric wall thickening and mucosal enhancement along the rectum anal canal with changes which represent proctitis or neoplasm.  There is also some layering within the urinary bladder. She had blood cultures done are no growth to date. She was started on initially vancomycin  and cefepime  Flagyl  given a dose of aztreonam  and acyclovir . Currently she is on acyclovir  prophylactic dosing, cefepime  metronidazole  and Vanco.  She is listed as allergic to penicillin but tolerates cephalosporins.  She had repeat fever to 102.6.  Repeat white blood count is up to 1.6. For her lymphoma she follows with Dr. Mara. She reports feeling a little better since admit. Denies diarrhea, abd pain. Does report 2 small skin lesions on R upper outer thigh which began a few weeks ago and itch. Mild sore on tongue but no mucositus, No HA, neck pain. No cough SOB. Portacath with without  issue  Past Medical History:  Diagnosis Date   Acute URI    Allergy    Anxiety    Asthma    well controlled   Cancer (HCC)    lymphoma in remission   Diverticulosis    DVT of upper extremity (deep vein thrombosis) (HCC) 07/2008 & 12/2007   Gastric ulcer 10/2007   EGD   GERD (gastroesophageal reflux disease)    Hepatitis B    treated   History of hiatal hernia    Hypertension    Laboratory confirmed diagnosis of COVID-19 06/27/2019   Lower extremity edema    Lymphoma (HCC)    Stage 3 large B cell lymphoma   Past Surgical History:  Procedure Laterality Date   ANTERIOR AND POSTERIOR REPAIR N/A 03/17/2020   Procedure: ANTERIOR (CYSTOCELE) AND POSTERIOR REPAIR (RECTOCELE);  Surgeon: Connell Davies, MD;  Location: [facility] ORS;  Service: Gynecology;  Laterality: N/A;   CHOLECYSTECTOMY     COLONOSCOPY  11/23/2007, 05/12/1994   COLONOSCOPY WITH PROPOFOL  N/A 03/31/2018   Procedure: COLONOSCOPY WITH PROPOFOL ;  Surgeon: Viktoria Lamar DASEN, MD;  Location: [facility] ENDOSCOPY;  Service: Endoscopy;  Laterality: N/A;   ESOPHAGOGASTRODUODENOSCOPY  09/03/2009, 10/14/2008, 09/30/2008, 11/23/2007   HEMORRHOID SURGERY     LAPAROSCOPIC VAGINAL HYSTERECTOMY WITH SALPINGO OOPHORECTOMY Bilateral 03/17/2020   Procedure: LAPAROSCOPIC ASSISTED VAGINAL HYSTERECTOMY WITH SALPINGO OOPHORECTOMY;  Surgeon: Connell Davies, MD;  Location: [facility] ORS;  Service: Gynecology;  Laterality: Bilateral;   LIVER BIOPSY  01/27/1996   PORTOCAVAL SHUNT PLACEMENT     and  removal   PUBOVAGINAL SLING N/A 03/17/2020   Procedure: PUBO-VAGINAL SLING-TVT;  Surgeon: Connell Davies, MD;  Location: [facility] ORS;  Service: Gynecology;  Laterality: N/A;   Social History[1] Family History  Problem Relation Age of Onset   Cancer Daughter    Asthma Mother    Breast cancer Neg Hx     Allergies: Allergies[2]  Current antibiotics: Antibiotics Given (last 72 hours)     Date/Time Action Medication Dose Rate   06/13/24 1219 New Bag/Given   aztreonam   (AZACTAM ) 2 g in sodium chloride  0.9 % 100 mL IVPB 2 g 200 mL/hr   06/13/24 1226 New Bag/Given   metroNIDAZOLE  (FLAGYL ) IVPB 500 mg 500 mg 100 mL/hr   06/13/24 1319 New Bag/Given   vancomycin  (VANCOCIN ) IVPB 1000 mg/200 mL premix 1,000 mg 200 mL/hr   06/13/24 1917 New Bag/Given   ceFEPIme  (MAXIPIME ) 2 g in sodium chloride  0.9 % 100 mL IVPB 2 g 200 mL/hr   06/13/24 2027 Given   acyclovir  (ZOVIRAX ) 200 MG capsule 400 mg 400 mg    06/13/24 2335 New Bag/Given   metroNIDAZOLE  (FLAGYL ) IVPB 500 mg 500 mg 100 mL/hr   06/14/24 0817 New Bag/Given   ceFEPIme  (MAXIPIME ) 2 g in sodium chloride  0.9 % 100 mL IVPB 2 g 200 mL/hr   06/14/24 1029 Given   acyclovir  (ZOVIRAX ) 200 MG capsule 400 mg 400 mg        MEDICATIONS:  acyclovir   400 mg Oral Daily   apixaban   5 mg Oral BID   fluticasone  furoate-vilanterol  1 puff Inhalation Daily   gabapentin   600 mg Oral TID   hydrocortisone   25 mg Rectal BID   hydrOXYzine   10 mg Oral QHS   pantoprazole   40 mg Oral Daily   sodium chloride  flush  3 mL Intravenous Q12H   traZODone   50 mg Oral QHS    Review of Systems - 11 systems reviewed and negative per HPI   OBJECTIVE: Temp:  [98.6 F (37 C)-103.1 F (39.5 C)] 98.6 F (37 C) (12/18 0805) Pulse Rate:  [84-100] 89 (12/18 0930) Resp:  [14-25] 22 (12/18 0930) BP: (76-114)/(38-88) 97/57 (12/18 0930) SpO2:  [91 %-100 %] 99 % (12/18 0930) Weight:  [72.1 kg] 72.1 kg (12/17 1147) Physical Exam  Constitutional:  oriented to person, place, and time. frail HENT: [place]/AT, PERRLA, no scleral icterus Mouth/Throat: Oropharynx is clear and moist.   No mucositis, no thrush No oropharyngeal exudate.  Cardiovascular: Normal rate, regular rhythm and normal heart sounds. Exam reveals no gallop and no friction rub.  No murmur heard.  Pulmonary/Chest: Effort normal and breath sounds normal. No respiratory distress.  has no wheezes.  Neck = supple, no nuchal rigidity Abdominal: Soft. Bowel sounds are normal.  exhibits  no distension. There is no tenderness.  Lymphadenopathy: no cervical adenopathy. No axillary adenopathy Neurological: alert and oriented to person, place, and time.  Skin: Skin is warm and dry. No rash noted. No erythema.   Psychiatric: a normal mood and affect.  behavior is normal.    LABS: Results for orders placed or performed during the hospital encounter of 06/13/24 (from the past 48 hours)  Lactic acid, plasma     Status: None   Collection Time: 06/13/24 11:55 AM  Result Value Ref Range   Lactic Acid, Venous 1.1 0.5 - 1.9 mmol/L    Comment: Performed at [facility], [address] [place], [place] [address]  Comprehensive metabolic panel     Status: Abnormal  Collection Time: 06/13/24 11:55 AM  Result Value Ref Range   Sodium 133 (L) 135 - 145 mmol/L   Potassium 3.4 (L) 3.5 - 5.1 mmol/L   Chloride 97 (L) 98 - 111 mmol/L   CO2 28 22 - 32 mmol/L   Glucose, Bld 101 (H) 70 - 99 mg/dL    Comment: Glucose reference range applies only to samples taken after fasting for at least 8 hours.   BUN 20 8 - 23 mg/dL   Creatinine, Ser 8.64 (H) 0.44 - 1.00 mg/dL   Calcium  8.5 (L) 8.9 - 10.3 mg/dL   Total Protein 6.0 (L) 6.5 - 8.1 g/dL   Albumin 3.6 3.5 - 5.0 g/dL   AST 16 15 - 41 U/L   ALT 18 0 - 44 U/L   Alkaline Phosphatase 70 38 - 126 U/L   Total Bilirubin 0.5 0.0 - 1.2 mg/dL   GFR, Estimated 40 (L) >60 mL/min    Comment: (NOTE) Calculated using the CKD-EPI Creatinine Equation (2021)    Anion gap 8 5 - 15    Comment: Performed at [facility], [address] [place], [place] [address]  CBC with Differential     Status: Abnormal   Collection Time: 06/13/24 11:55 AM  Result Value Ref Range   WBC 0.7 (LL) 4.0 - 10.5 K/uL    Comment: REPEATED TO VERIFY WHITE COUNT CONFIRMED ON SMEAR This critical result has been called to Norlene Hue, RN by Geoffrey McAdoo on 06/13/2024 13:11:10, and has been read back.    RBC 3.55 (L) 3.87 - 5.11 MIL/uL   Hemoglobin 10.9  (L) 12.0 - 15.0 g/dL   HCT 67.0 (L) 63.9 - 53.9 %   MCV 92.7 80.0 - 100.0 fL   MCH 30.7 26.0 - 34.0 pg   MCHC 33.1 30.0 - 36.0 g/dL   RDW 86.7 88.4 - 84.4 %   Platelets 57 (L) 150 - 400 K/uL    Comment: PLATELET COUNT CONFIRMED BY SMEAR Immature Platelet Fraction may be clinically indicated, consider ordering this additional test [id number]    nRBC 0.0 0.0 - 0.2 %   Neutrophils Relative % 28 %   Neutro Abs 0.2 (LL) 1.7 - 7.7 K/uL    Comment: REPEATED TO VERIFY This critical result has been called to Norlene Hue, RN by Geoffrey McAdoo on 06/13/2024 13:11:10, and has been read back.    Lymphocytes Relative 55 %   Lymphs Abs 0.4 (L) 0.7 - 4.0 K/uL   Monocytes Relative 16 %   Monocytes Absolute 0.1 0.1 - 1.0 K/uL   Eosinophils Relative 0 %   Eosinophils Absolute 0.0 0.0 - 0.5 K/uL   Basophils Relative 1 %   Basophils Absolute 0.0 0.0 - 0.1 K/uL   WBC Morphology See Note     Comment: Too few to count, smear available for review   RBC Morphology See Note     Comment: MIXED RBC POPULATION   Smear Review Normal platelet morphology    Immature Granulocytes 0 %   Abs Immature Granulocytes 0.00 0.00 - 0.07 K/uL    Comment: Performed at [facility], [address] [place], [place] [address]  Protime-INR     Status: None   Collection Time: 06/13/24 11:55 AM  Result Value Ref Range   Prothrombin Time 13.8 11.4 - 15.2 seconds   INR 1.0 0.8 - 1.2    Comment: (NOTE) INR goal varies based on device and disease states. Performed at [facility], [address] [place], [place] [address]   Magnesium      Status: Abnormal   Collection Time: 06/13/24 11:55 AM  Result Value Ref Range   Magnesium  2.8 (H) 1.7 - 2.4 mg/dL    Comment: Performed at [facility], [address] [place], [place] [address]  Technologist smear review     Status: None   Collection Time: 06/13/24 11:55 AM  Result Value Ref Range   WBC MORPHOLOGY TOO FEW TO COUNT, SMEAR AVAILABLE FOR  REVIEW    RBC MORPHOLOGY MIXED RBC POPULATION    Plt Morphology Normal platelet morphology    Clinical Information neutropenic fever/thrombocytopenia     Comment: Performed at [facility], [address] [place], [place] [address]  Urinalysis, w/ Reflex to Culture (Infection Suspected) -Urine, Clean Catch     Status: Abnormal   Collection Time: 06/13/24 11:56 AM  Result Value Ref Range   Specimen Source URINE, CLEAN CATCH    Color, Urine YELLOW (A) YELLOW   APPearance CLOUDY (A) CLEAR   Specific Gravity, Urine 1.015 1.005 - 1.030   pH 8.0 5.0 - 8.0   Glucose, UA 50 (A) NEGATIVE mg/dL   Hgb urine dipstick NEGATIVE NEGATIVE   Bilirubin Urine NEGATIVE NEGATIVE   Ketones, ur NEGATIVE NEGATIVE mg/dL   Protein, ur 899 (A) NEGATIVE mg/dL   Nitrite NEGATIVE NEGATIVE   Leukocytes,Ua NEGATIVE NEGATIVE   RBC / HPF 0-5 0 - 5 RBC/hpf   WBC, UA 0-5 0 - 5 WBC/hpf    Comment:        Reflex urine culture not performed if WBC <=10, OR if Squamous epithelial cells >5. If Squamous epithelial cells >5 suggest recollection.    Bacteria, UA RARE (A) NONE SEEN   Squamous Epithelial / HPF 0-5 0 - 5 /HPF   Amorphous Crystal PRESENT     Comment: Performed at [facility], [address] [place], [place] [address]  Resp panel by RT-PCR (RSV, Flu A&B, Covid) Anterior Nasal Swab     Status: None   Collection Time: 06/13/24 11:56 AM   Specimen: Anterior Nasal Swab  Result Value Ref Range   SARS Coronavirus 2 by RT PCR NEGATIVE NEGATIVE    Comment: (NOTE) SARS-CoV-2 target nucleic acids are NOT DETECTED.  The SARS-CoV-2 RNA is generally detectable in upper respiratory specimens during the acute phase of infection. The lowest concentration of SARS-CoV-2 viral copies this assay can detect is 138 copies/mL. A negative result does not preclude SARS-Cov-2 infection and should not be used as the sole basis for treatment or other patient management decisions. A negative result may occur  with  improper specimen collection/handling, submission of specimen other than nasopharyngeal swab, presence of viral mutation(s) within the areas targeted by this assay, and inadequate number of viral copies(<138 copies/mL). A negative result must be combined with clinical observations, patient history, and epidemiological information. The expected result is Negative.  Fact Sheet for Patients:  bloggercourse.com  Fact Sheet for Healthcare Providers:  seriousbroker.it  This test is no t yet approved or cleared by the [place]  FDA and  has been authorized for detection and/or diagnosis of SARS-CoV-2 by FDA under an Emergency Use Authorization (EUA). This EUA will remain  in effect (meaning this test can be used) for the duration of the COVID-19 declaration under Section 564(b)(1) of the Act, 21 U.S.C.section 360bbb-3(b)(1), unless the authorization is terminated  or revoked sooner.       Influenza A by PCR NEGATIVE NEGATIVE  Influenza B by PCR NEGATIVE NEGATIVE    Comment: (NOTE) The Xpert Xpress SARS-CoV-2/FLU/RSV plus assay is intended as an aid in the diagnosis of influenza from Nasopharyngeal swab specimens and should not be used as a sole basis for treatment. Nasal washings and aspirates are unacceptable for Xpert Xpress SARS-CoV-2/FLU/RSV testing.  Fact Sheet for Patients: bloggercourse.com  Fact Sheet for Healthcare Providers: seriousbroker.it  This test is not yet approved or cleared by the [place]  FDA and has been authorized for detection and/or diagnosis of SARS-CoV-2 by FDA under an Emergency Use Authorization (EUA). This EUA will remain in effect (meaning this test can be used) for the duration of the COVID-19 declaration under Section 564(b)(1) of the Act, 21 U.S.C. section 360bbb-3(b)(1), unless the authorization is terminated or revoked.     Resp  Syncytial Virus by PCR NEGATIVE NEGATIVE    Comment: (NOTE) Fact Sheet for Patients: bloggercourse.com  Fact Sheet for Healthcare Providers: seriousbroker.it  This test is not yet approved or cleared by the [place]  FDA and has been authorized for detection and/or diagnosis of SARS-CoV-2 by FDA under an Emergency Use Authorization (EUA). This EUA will remain in effect (meaning this test can be used) for the duration of the COVID-19 declaration under Section 564(b)(1) of the Act, 21 U.S.C. section 360bbb-3(b)(1), unless the authorization is terminated or revoked.  Performed at [facility], [address] [place], [place] [address]   Blood Culture (routine x 2)     Status: None (Preliminary result)   Collection Time: 06/13/24 12:12 PM   Specimen: BLOOD  Result Value Ref Range   Specimen Description BLOOD LEFT ANTECUBITAL    Special Requests      BOTTLES DRAWN AEROBIC AND ANAEROBIC Blood Culture results may not be optimal due to an inadequate volume of blood received in culture bottles   Culture      NO GROWTH < 24 HOURS Performed at [facility], [address] [place], [place] [address]    Report Status PENDING   Blood Culture (routine x 2)     Status: None (Preliminary result)   Collection Time: 06/13/24 12:17 PM   Specimen: BLOOD  Result Value Ref Range   Specimen Description BLOOD RIGHT ANTECUBITAL    Special Requests      BOTTLES DRAWN AEROBIC AND ANAEROBIC Blood Culture results may not be optimal due to an inadequate volume of blood received in culture bottles   Culture      NO GROWTH < 24 HOURS Performed at [facility], [address] [place], [place] [address]    Report Status PENDING   Respiratory (~20 pathogens) panel by PCR     Status: None   Collection Time: 06/13/24  6:26 PM   Specimen: Nasopharyngeal Swab; Respiratory  Result Value Ref Range   Adenovirus NOT DETECTED NOT  DETECTED   Coronavirus 229E NOT DETECTED NOT DETECTED    Comment: (NOTE) The Coronavirus on the Respiratory Panel, DOES NOT test for the novel  Coronavirus (2019 nCoV)    Coronavirus HKU1 NOT DETECTED NOT DETECTED   Coronavirus NL63 NOT DETECTED NOT DETECTED   Coronavirus OC43 NOT DETECTED NOT DETECTED   Metapneumovirus NOT DETECTED NOT DETECTED   Rhinovirus / Enterovirus NOT DETECTED NOT DETECTED   Influenza A NOT DETECTED NOT DETECTED   Influenza B NOT DETECTED NOT DETECTED   Parainfluenza Virus 1 NOT DETECTED NOT DETECTED   Parainfluenza Virus 2 NOT DETECTED NOT DETECTED   Parainfluenza Virus 3 NOT DETECTED  NOT DETECTED   Parainfluenza Virus 4 NOT DETECTED NOT DETECTED   Respiratory Syncytial Virus NOT DETECTED NOT DETECTED   Bordetella pertussis NOT DETECTED NOT DETECTED   Bordetella Parapertussis NOT DETECTED NOT DETECTED   Chlamydophila pneumoniae NOT DETECTED NOT DETECTED   Mycoplasma pneumoniae NOT DETECTED NOT DETECTED    Comment: Performed at [facility], [address], [place], [place] [address]  Immature Platelet Fraction     Status: None   Collection Time: 06/13/24 11:08 PM  Result Value Ref Range   Immature Platelet Fraction 4.4 1.2 - 8.6 %    Comment: Performed at [facility], [address] [place], [place] [address]  Basic metabolic panel     Status: Abnormal   Collection Time: 06/14/24  7:24 AM  Result Value Ref Range   Sodium 139 135 - 145 mmol/L   Potassium 3.5 3.5 - 5.1 mmol/L   Chloride 108 98 - 111 mmol/L   CO2 24 22 - 32 mmol/L   Glucose, Bld 78 70 - 99 mg/dL    Comment: Glucose reference range applies only to samples taken after fasting for at least 8 hours.   BUN 19 8 - 23 mg/dL   Creatinine, Ser 8.84 (H) 0.44 - 1.00 mg/dL   Calcium  7.5 (L) 8.9 - 10.3 mg/dL   GFR, Estimated 49 (L) >60 mL/min    Comment: (NOTE) Calculated using the CKD-EPI Creatinine Equation (2021)    Anion gap 7 5 - 15    Comment: Performed at [facility], [address] [place], [place] [address]  CBC     Status: Abnormal   Collection Time: 06/14/24  7:24 AM  Result Value Ref Range   WBC 1.6 (L) 4.0 - 10.5 K/uL   RBC 2.85 (L) 3.87 - 5.11 MIL/uL   Hemoglobin 9.0 (L) 12.0 - 15.0 g/dL   HCT 72.3 (L) 63.9 - 53.9 %   MCV 96.8 80.0 - 100.0 fL   MCH 31.6 26.0 - 34.0 pg   MCHC 32.6 30.0 - 36.0 g/dL   RDW 86.2 88.4 - 84.4 %   Platelets 52 (L) 150 - 400 K/uL    Comment: REPEATED TO VERIFY Immature Platelet Fraction may be clinically indicated, consider ordering this additional test [id number]    nRBC 0.0 0.0 - 0.2 %    Comment: Performed at [facility], [address] [place], [place] [address]  CBG monitoring, ED     Status: None   Collection Time: 06/14/24  8:02 AM  Result Value Ref Range   Glucose-Capillary 75 70 - 99 mg/dL    Comment: Glucose reference range applies only to samples taken after fasting for at least 8 hours.   No components found for: ESR, C REACTIVE PROTEIN MICRO: Recent Results (from the past 720 hours)  Resp panel by RT-PCR (RSV, Flu A&B, Covid) Anterior Nasal Swab     Status: None   Collection Time: 06/13/24 11:56 AM   Specimen: Anterior Nasal Swab  Result Value Ref Range Status   SARS Coronavirus 2 by RT PCR NEGATIVE NEGATIVE Final    Comment: (NOTE) SARS-CoV-2 target nucleic acids are NOT DETECTED.  The SARS-CoV-2 RNA is generally detectable in upper respiratory specimens during the acute phase of infection. The lowest concentration of SARS-CoV-2 viral copies this assay can detect is 138 copies/mL. A negative result does not preclude SARS-Cov-2 infection and should not be used as the sole basis for treatment or other patient management decisions. A  negative result may occur with  improper specimen collection/handling, submission of specimen other than nasopharyngeal swab, presence of viral mutation(s) within the areas targeted by this assay, and inadequate number of  viral copies(<138 copies/mL). A negative result must be combined with clinical observations, patient history, and epidemiological information. The expected result is Negative.  Fact Sheet for Patients:  bloggercourse.com  Fact Sheet for Healthcare Providers:  seriousbroker.it  This test is no t yet approved or cleared by the [place]  FDA and  has been authorized for detection and/or diagnosis of SARS-CoV-2 by FDA under an Emergency Use Authorization (EUA). This EUA will remain  in effect (meaning this test can be used) for the duration of the COVID-19 declaration under Section 564(b)(1) of the Act, 21 U.S.C.section 360bbb-3(b)(1), unless the authorization is terminated  or revoked sooner.       Influenza A by PCR NEGATIVE NEGATIVE Final   Influenza B by PCR NEGATIVE NEGATIVE Final    Comment: (NOTE) The Xpert Xpress SARS-CoV-2/FLU/RSV plus assay is intended as an aid in the diagnosis of influenza from Nasopharyngeal swab specimens and should not be used as a sole basis for treatment. Nasal washings and aspirates are unacceptable for Xpert Xpress SARS-CoV-2/FLU/RSV testing.  Fact Sheet for Patients: bloggercourse.com  Fact Sheet for Healthcare Providers: seriousbroker.it  This test is not yet approved or cleared by the [place]  FDA and has been authorized for detection and/or diagnosis of SARS-CoV-2 by FDA under an Emergency Use Authorization (EUA). This EUA will remain in effect (meaning this test can be used) for the duration of the COVID-19 declaration under Section 564(b)(1) of the Act, 21 U.S.C. section 360bbb-3(b)(1), unless the authorization is terminated or revoked.     Resp Syncytial Virus by PCR NEGATIVE NEGATIVE Final    Comment: (NOTE) Fact Sheet for Patients: bloggercourse.com  Fact Sheet for Healthcare  Providers: seriousbroker.it  This test is not yet approved or cleared by the [place]  FDA and has been authorized for detection and/or diagnosis of SARS-CoV-2 by FDA under an Emergency Use Authorization (EUA). This EUA will remain in effect (meaning this test can be used) for the duration of the COVID-19 declaration under Section 564(b)(1) of the Act, 21 U.S.C. section 360bbb-3(b)(1), unless the authorization is terminated or revoked.  Performed at [facility], [address] [place], [place] [address]   Blood Culture (routine x 2)     Status: None (Preliminary result)   Collection Time: 06/13/24 12:12 PM   Specimen: BLOOD  Result Value Ref Range Status   Specimen Description BLOOD LEFT ANTECUBITAL  Final   Special Requests   Final    BOTTLES DRAWN AEROBIC AND ANAEROBIC Blood Culture results may not be optimal due to an inadequate volume of blood received in culture bottles   Culture   Final    NO GROWTH < 24 HOURS Performed at [facility], [address], [place], [place] [address]    Report Status PENDING  Incomplete  Blood Culture (routine x 2)     Status: None (Preliminary result)   Collection Time: 06/13/24 12:17 PM   Specimen: BLOOD  Result Value Ref Range Status   Specimen Description BLOOD RIGHT ANTECUBITAL  Final   Special Requests   Final    BOTTLES DRAWN AEROBIC AND ANAEROBIC Blood Culture results may not be optimal due to an inadequate volume of blood received in culture bottles   Culture   Final    NO GROWTH < 24 HOURS Performed at [facility], [address] [place], [place] [address]    Report Status PENDING  Incomplete  Respiratory (~20 pathogens) panel by PCR     Status: None   Collection Time: 06/13/24  6:26 PM   Specimen: Nasopharyngeal Swab; Respiratory  Result Value Ref Range Status   Adenovirus NOT DETECTED NOT DETECTED Final   Coronavirus 229E NOT DETECTED NOT DETECTED Final    Comment:  (NOTE) The Coronavirus on the Respiratory Panel, DOES NOT test for the novel  Coronavirus (2019 nCoV)    Coronavirus HKU1 NOT DETECTED NOT DETECTED Final   Coronavirus NL63 NOT DETECTED NOT DETECTED Final   Coronavirus OC43 NOT DETECTED NOT DETECTED Final   Metapneumovirus NOT DETECTED NOT DETECTED Final   Rhinovirus / Enterovirus NOT DETECTED NOT DETECTED Final   Influenza A NOT DETECTED NOT DETECTED Final   Influenza B NOT DETECTED NOT DETECTED Final   Parainfluenza Virus 1 NOT DETECTED NOT DETECTED Final   Parainfluenza Virus 2 NOT DETECTED NOT DETECTED Final   Parainfluenza Virus 3 NOT DETECTED NOT DETECTED Final   Parainfluenza Virus 4 NOT DETECTED NOT DETECTED Final   Respiratory Syncytial Virus NOT DETECTED NOT DETECTED Final   Bordetella pertussis NOT DETECTED NOT DETECTED Final   Bordetella Parapertussis NOT DETECTED NOT DETECTED Final   Chlamydophila pneumoniae NOT DETECTED NOT DETECTED Final   Mycoplasma pneumoniae NOT DETECTED NOT DETECTED Final    Comment: Performed at [facility], [address] [place], [place] [address]    IMAGING: CT [id number] PELVIS W CONTRAST Result Date: 06/13/2024 CLINICAL DATA:  sepsis, rectal pain EXAM: CT ABDOMEN AND PELVIS WITH CONTRAST TECHNIQUE: Multidetector CT imaging of the abdomen and pelvis was performed using the standard protocol following bolus administration of intravenous contrast. RADIATION DOSE REDUCTION: This exam was performed according to the departmental dose-optimization program which includes automated exposure control, adjustment of the mA and/or kV according to patient size and/or use of iterative reconstruction technique. CONTRAST:  80mL OMNIPAQUE  IOHEXOL  300 MG/ML  SOLN COMPARISON:  May 22, 2024, April 27, 2024 FINDINGS: Lower chest: No focal airspace consolidation or pleural effusion.Multifocal subsegmental atelectasis in the lung bases. Hepatobiliary: No mass.No radiopaque stones or wall thickening of the  gallbladder. Mild dilation of the common bile duct, likely related to the prior cholecystectomy. The portal veins are patent. Pancreas: No mass or main ductal dilation. No peripancreatic inflammation or fluid collection. Spleen: Normal size. No mass. Adrenals/Urinary Tract: No adrenal masses. Subcentimeter hypodensity noted in the left kidney, too small to definitively characterize, but likely a small cyst. No nephrolithiasis or hydronephrosis. The urinary bladder is distended without wall thickening. Curvilinear hypodensity layering dependently within the urinary bladder (axial 79). Stomach/Bowel: The stomach is decompressed without focal abnormality. No small bowel wall thickening or inflammation. No small bowel obstruction.Normal appendix. Descending and sigmoid colonic diverticulosis. No changes of acute diverticulitis. Asymmetric wall thickening and enhancement along the rectum/anal canal. Vascular/Lymphatic: No aortic aneurysm. Scattered aortoiliac atherosclerosis. No intraabdominal or pelvic lymphadenopathy. Reproductive: Hysterectomy. No concerning adnexal mass.No free pelvic fluid. Findings consistent with pelvic floor laxity. Other: No pneumoperitoneum, ascites, or mesenteric inflammation. Musculoskeletal: No acute fracture or destructive lesion. Osteopenia. Mild grade 1 anterolisthesis of L4 on L5. Multilevel degenerative disc disease of the spine. Thoracic DISH. IMPRESSION: 1. Asymmetric wall thickening and mucosal enhancement along the rectum/anal canal, which may represent changes of proctitis or neoplasm. Correlation with physical exam findings recommended. 2. Curvilinear hypodensity layering dependently within the urinary bladder (axial 79), which may represent mixing  artifact from excreted contrast. Correlation with urinalysis recommended to exclude blood products or infectious debris. 3. Descending and sigmoid colonic diverticulosis. No changes of acute diverticulitis. 4. Findings consistent with  pelvic floor laxity. Aortic Atherosclerosis (ICD10-I70.0). Electronically Signed   By: Rogelia Myers M.D.   On: 06/13/2024 15:10   DG Chest Port 1 View Result Date: 06/13/2024 CLINICAL DATA:  Questionable sepsis - evaluate for abnormality EXAM: PORTABLE CHEST - 1 VIEW COMPARISON:  January 14, 2024, May 22, 2024 FINDINGS: Subsegmental atelectasis in the left lung base. No focal airspace consolidation, pleural effusion, or pneumothorax. No cardiomegaly. Right chest port is similarly positioned terminating at the cavoatrial junction. Tortuous aorta with aortic atherosclerosis. No acute fracture or destructive lesions. Multilevel thoracic osteophytosis. Remote, healed rib fractures. Osteoarthritis of both glenohumeral joints. IMPRESSION: No acute cardiopulmonary abnormality. Electronically Signed   By: Rogelia Myers M.D.   On: 06/13/2024 14:00   NM PET Image Initial (PI) Skull Base To Thigh (F-18 FDG) Result Date: 05/22/2024 CLINICAL DATA:  Subsequent treatment strategy for lymphoma. Right cervical adenopathy on recent CT. 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 EXAM: NUCLEAR MEDICINE PET SKULL BASE TO THIGH TECHNIQUE: 8.96 mCi F-18 FDG was injected intravenously. Full-ring PET imaging was performed from the skull base to thigh after the radiotracer. CT data was obtained and used for attenuation correction and anatomic localization. Fasting blood glucose: 133 mg/dl COMPARISON:  PET-CT 97/89/7983. CT of the neck, chest, abdomen and pelvis 04/27/2024. FINDINGS: Mediastinal blood pool activity: SUV max 3.0 Liver activity: SUV max 4.4 NECK: The recently demonstrated and biopsied mass in the right neck demonstrates marked hypermetabolic activity (SUV max 31.4). This mass involves level IIa lymph nodes and measures 4.8 x 3.2 cm on image 17/2. No other hypermetabolic cervical lymph nodes are identified.Fairly symmetric activity within the lymphoid tissue of Waldeyer's ring is within physiologic limits. No suspicious activity  identified within the pharyngeal mucosal space. Incidental CT findings: none CHEST: There are no hypermetabolic mediastinal, hilar or axillary lymph nodes. No hypermetabolic pulmonary activity or suspicious nodularity. Incidental CT findings: Stable atelectasis or scarring at both lung bases. Right IJ Port-A-Cath extends to the superior cavoatrial junction. Mild atherosclerosis of the aorta, great vessels and coronary arteries. ABDOMEN/PELVIS: There is no hypermetabolic activity within the liver, adrenal glands, spleen or pancreas. There is no hypermetabolic nodal activity in the abdomen or pelvis. Incidental CT findings: Status post cholecystectomy. Scattered calcified splenic granulomas. Moderate diverticulosis of the descending and sigmoid colon without evidence of acute inflammation. Previous hysterectomy without evidence of adnexal mass. Mild aortoiliac atherosclerosis. SKELETON: There is no hypermetabolic activity to suggest osseous metastatic disease. Incidental CT findings: Relatively mild multilevel spondylosis. IMPRESSION: 1. The recently demonstrated and biopsied right cervical mass demonstrates marked hypermetabolic activity consistent with recurrent lymphoma ([place] 5). 2. No other evidence of recurrent lymphoma in the neck, chest, abdomen or pelvis. 3.  Aortic Atherosclerosis (ICD10-I70.0). Electronically Signed   By: Elsie Perone M.D.   On: 05/22/2024 13:01    Assessment:   METZLI POLLICK is a 78 y.o. female with lymphoma undergoing chemotherapy admitted with NTP Fever. CT scan showed rectal thickening with possible proctitis. Clinically stable. No evidence of portacath infection. 2 small skin lesions noted but pt reports were there for several weeks.  Recommendations Cont cefepime  and flagyl  FU BCX If becomes unstable would start vancomycin    Thank you very much for allowing me to participate in the care of this patient. Please call with questions.   Alm SQUIBB. Epifanio, MD        [  1]  Social History Tobacco Use   Smoking status: Never   Smokeless tobacco: Never  Vaping Use   Vaping status: Never Used  Substance Use Topics   Alcohol use: No   Drug use: Never  [2]  Allergies Allergen Reactions   Penicillins Rash    Tolerates cephalosporin   Prednisone  Hives   Sulfa Antibiotics Hives and Shortness Of Breath   Enalapril     Hypotension    Medrol  [Methylprednisolone ] Rash

## 2024-06-14 NOTE — ED Notes (Signed)
 Patient cleaned up by this RN and EDT Ariel after an episode of urinary and bowel incontinence. Patient placed in a new brief. Patient repositioned and bed placed in lowest position with call light in reach.

## 2024-06-14 NOTE — ED Notes (Signed)
 Pt had large loose bowel movement. This RN and Con EDT changed pt's gown, blankets, bed sheets, bed pads, and brief. Peri care performed. Dezii MD aware that this is pt's third bowel movement this morning.

## 2024-06-14 NOTE — ED Notes (Signed)
 Called pharmacy about fluticason furoate-vilanterol inhaler. Pharmacy said they will tube it up to ED.

## 2024-06-14 NOTE — Progress Notes (Signed)
° °  Brief Progress Note   _____________________________________________________________________________________________________________  Patient Name: Erin [name] Patient DOB: 1945/10/02 Date: @TODAY @      Data: Reviewed vital signs, labs, and clinical notes.    Action: No action required at this time.    Response:  Oncology and ID consulted. Hypotensive.  _____________________________________________________________________________________________________________  [facility] RN Expeditor Jaclene Bartelt S Jerilynn Feldmeier Please contact us  directly via secure chat (search for [facility]) or by calling us  at [phone] [facility]).

## 2024-06-14 NOTE — ED Notes (Signed)
 With pt permission, this RN called pt's daughter Amy and gave an update on care.

## 2024-06-14 NOTE — Plan of Care (Signed)

## 2024-06-14 NOTE — Progress Notes (Signed)
 PROGRESS NOTE    Erin [name]  FMW:[id number] DOB: [birth year] DOA: 06/13/2024 PCP: [facility]  Chief Complaint  Patient presents with   Dizziness   Rectal Pain    Hospital Course:  Erin [name] is a 78 year old female with hypertension, recent diagnosis of recurrent lymphoma R-CHOP, asthma, CKD stage IIIb who presented to the ED with rectal pain, dizziness, fever.  Patient endorses progressive weakness over the days prior to arrival to the degree that she now cannot ambulate.  Labs in the ED reveal severe leukopenia at 0.7 with absolute neutrophil count at 200.  Thrombocytopenia platelets 57.  CMP with multiple electrolyte abnormalities though stable renal function.  RVP negative.  Lactic acid WNL.  CXR negative.  CT abdomen pelvis with asymmetric wall thickening and mucosal enhancement along the rectum concerning for proctitis versus neoplasm.  Patient was initially hypotensive but fluid responsive.  She was admitted for neutropenic fever and started on broad-spectrum antibiotics  Subjective: This morning patient endorses persistent weakness.  He was able to eat some of her breakfast this morning   Objective: Vitals:   06/14/24 0805 06/14/24 0930 06/14/24 1300 06/14/24 1307  BP:  (!) 97/57 (!) 83/51   Pulse:  89 91   Resp:  (!) 22 (!) 25   Temp: 98.6 F (37 C)   98.5 F (36.9 C)  TempSrc: Oral   Oral  SpO2:  99% 100%   Weight:      Height:        Intake/Output Summary (Last 24 hours) at 06/14/2024 1323 Last data filed at 06/13/2024 2119 Gross per 24 hour  Intake 1003 ml  Output --  Net 1003 ml   Filed Weights   06/13/24 1147  Weight: 72.1 kg    Examination: General exam: Appears calm and comfortable, NAD, appears weak and fatigued Respiratory system: No work of breathing, symmetric chest wall expansion Cardiovascular system: S1 & S2 heard, RRR.  Gastrointestinal system: Abdomen is nondistended, soft and nontender.  Neuro: Alert  and oriented. Extremities: Symmetric, expected ROM Skin: No rashes, lesions Psychiatry: Demonstrates appropriate judgement and insight. Mood & affect appropriate for situation.   Assessment & Plan:  Principal Problem:   Neutropenic fever Active Problems:   GERD without esophagitis   Anxiety and depression   Lymphoma (HCC)   CKD (chronic kidney disease) stage 3, GFR 30-59 ml/min (HCC)   Chronic asthma    Neutropenic fever - Presumed source is proctitis.  Also having persistent diarrhea.  - CT abdomen pelvis consistent with proctitis - C. difficile negative - CXR, UA, and RVP WNL - Continue cefepime  and Flagyl  for now - ID consulted and agrees with the above.  No indication for vancomycin  at this time though she did receive 1 dose in the ED - Follow CBC and CMP closely - Protective precautions - Follow-up blood cultures to completion  Proctitis Diarrhea - May be secondary to chemotherapy - C. difficile negative - Will order for Imodium to ease symptom burden - Treatment for neutropenic fever with antibiotics as above  Recurrent lymphoma - Initially diagnosed with lymphoma in 2009, was in remission with recurrence in 2015 and then again in 2005. - She was recently started on R-CHOP with plan for chemotherapy every 21 days x 3 cycles.  First dose 12/8. 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 - Outpatient follow up for lifestyle modification and risk factor management   DVT prophylaxis: Eliquis    Code Status: Full Code Disposition:   Inpatient pending clinical resolution  Consultants:  Treatment Team:  Consulting Physician: Babara Call, MD  Procedures:  N/a  Antimicrobials:  Anti-infectives (From admission, onward)    Start     Dose/Rate Route Frequency Ordered Stop   06/13/24 2359  metroNIDAZOLE  (FLAGYL ) IVPB 500 mg        500 mg 100 mL/hr over 60 Minutes Intravenous Every 12 hours 06/13/24 1818     06/13/24 2030  acyclovir  (ZOVIRAX ) 200 MG capsule 400 mg        400 mg Oral Daily 06/13/24 2001     06/13/24 2000  ceFEPIme  (MAXIPIME ) 2 g in sodium chloride  0.9 % 100 mL IVPB        2 g 200 mL/hr over 30 Minutes Intravenous Every 12 hours 06/13/24 1820     06/13/24 1200  aztreonam  (AZACTAM ) 2 g in sodium chloride  0.9 % 100 mL IVPB        2 g 200 mL/hr over 30 Minutes Intravenous  Once 06/13/24 1159 06/13/24 1249   06/13/24 1200  metroNIDAZOLE  (FLAGYL ) IVPB 500 mg        500 mg 100 mL/hr over 60 Minutes Intravenous  Once 06/13/24 1159 06/13/24 1326   06/13/24 1200  vancomycin  (VANCOCIN ) IVPB 1000 mg/200 mL premix        1,000 mg 200 mL/hr over 60 Minutes Intravenous  Once 06/13/24 1159 06/13/24 1419       Data Reviewed: I have personally reviewed following labs and imaging studies CBC: Recent Labs  Lab 06/13/24 1155 06/14/24 0724  WBC 0.7* 1.6*  NEUTROABS 0.2*  --   HGB 10.9* 9.0*  HCT 32.9* 27.6*  MCV 92.7 96.8  PLT 57* 52*   Basic Metabolic Panel: Recent Labs  Lab 06/13/24 1155 06/14/24 0724  NA 133* 139  K 3.4* 3.5  CL 97* 108  CO2 28 24  GLUCOSE 101* 78  BUN 20 19  CREATININE 1.35* 1.15*  CALCIUM  8.5* 7.5*  MG 2.8*  --    GFR: Estimated Creatinine Clearance: 39 mL/min (A) (by C-G formula based on SCr of 1.15 mg/dL (H)). Liver Function Tests: Recent Labs  Lab 06/13/24 1155  AST 16  ALT 18  ALKPHOS 70  BILITOT 0.5  PROT 6.0*  ALBUMIN 3.6   CBG: Recent Labs  Lab 06/14/24 0802  GLUCAP 75    Recent Results (from the past 240 hours)  Resp panel by RT-PCR (RSV, Flu A&B,  Covid) Anterior Nasal Swab     Status: None   Collection Time: 06/13/24 11:56 AM   Specimen: Anterior Nasal Swab  Result Value Ref Range Status   SARS Coronavirus 2 by RT PCR NEGATIVE NEGATIVE Final    Comment: (NOTE) SARS-CoV-2 target nucleic acids are NOT DETECTED.  The SARS-CoV-2 RNA is generally detectable in upper respiratory specimens during the acute phase of infection. The lowest concentration of SARS-CoV-2 viral copies this assay can detect is 138 copies/mL. A negative result does not preclude SARS-Cov-2 infection and should not be used as the sole basis for treatment or other patient management  decisions. A negative result may occur with  improper specimen collection/handling, submission of specimen other than nasopharyngeal swab, presence of viral mutation(s) within the areas targeted by this assay, and inadequate number of viral copies(<138 copies/mL). A negative result must be combined with clinical observations, patient history, and epidemiological information. The expected result is Negative.  Fact Sheet for Patients:  bloggercourse.com  Fact Sheet for Healthcare Providers:  seriousbroker.it  This test is no t yet approved or cleared by the [place]  FDA and  has been authorized for detection and/or diagnosis of SARS-CoV-2 by FDA under an Emergency Use Authorization (EUA). This EUA will remain  in effect (meaning this test can be used) for the duration of the COVID-19 declaration under Section 564(b)(1) of the Act, 21 U.S.C.section 360bbb-3(b)(1), unless the authorization is terminated  or revoked sooner.       Influenza A by PCR NEGATIVE NEGATIVE Final   Influenza B by PCR NEGATIVE NEGATIVE Final    Comment: (NOTE) The Xpert Xpress SARS-CoV-2/FLU/RSV plus assay is intended as an aid in the diagnosis of influenza from Nasopharyngeal swab specimens and should not be used as a sole basis for treatment. Nasal  washings and aspirates are unacceptable for Xpert Xpress SARS-CoV-2/FLU/RSV testing.  Fact Sheet for Patients: bloggercourse.com  Fact Sheet for Healthcare Providers: seriousbroker.it  This test is not yet approved or cleared by the [place]  FDA and has been authorized for detection and/or diagnosis of SARS-CoV-2 by FDA under an Emergency Use Authorization (EUA). This EUA will remain in effect (meaning this test can be used) for the duration of the COVID-19 declaration under Section 564(b)(1) of the Act, 21 U.S.C. section 360bbb-3(b)(1), unless the authorization is terminated or revoked.     Resp Syncytial Virus by PCR NEGATIVE NEGATIVE Final    Comment: (NOTE) Fact Sheet for Patients: bloggercourse.com  Fact Sheet for Healthcare Providers: seriousbroker.it  This test is not yet approved or cleared by the [place]  FDA and has been authorized for detection and/or diagnosis of SARS-CoV-2 by FDA under an Emergency Use Authorization (EUA). This EUA will remain in effect (meaning this test can be used) for the duration of the COVID-19 declaration under Section 564(b)(1) of the Act, 21 U.S.C. section 360bbb-3(b)(1), unless the authorization is terminated or revoked.  Performed at [facility], [address] [place], [place] [address]   Blood Culture (routine x 2)     Status: None (Preliminary result)   Collection Time: 06/13/24 12:12 PM   Specimen: BLOOD  Result Value Ref Range Status   Specimen Description BLOOD LEFT ANTECUBITAL  Final   Special Requests   Final    BOTTLES DRAWN AEROBIC AND ANAEROBIC Blood Culture results may not be optimal due to an inadequate volume of blood received in culture bottles   Culture   Final    NO GROWTH < 24 HOURS Performed at [facility], [address] [place], [place] [address]    Report Status PENDING   Incomplete  Blood Culture (routine x 2)     Status: None (Preliminary result)   Collection Time: 06/13/24 12:17 PM   Specimen: BLOOD  Result Value Ref Range Status   Specimen Description BLOOD RIGHT ANTECUBITAL  Final   Special Requests   Final    BOTTLES DRAWN AEROBIC AND ANAEROBIC Blood Culture results may not be optimal due to an inadequate volume of blood received in culture bottles   Culture   Final    NO GROWTH < 24 HOURS Performed  at [facility], [address] [place], [place] [address]    Report Status PENDING  Incomplete  Respiratory (~20 pathogens) panel by PCR     Status: None   Collection Time: 06/13/24  6:26 PM   Specimen: Nasopharyngeal Swab; Respiratory  Result Value Ref Range Status   Adenovirus NOT DETECTED NOT DETECTED Final   Coronavirus 229E NOT DETECTED NOT DETECTED Final    Comment: (NOTE) The Coronavirus on the Respiratory Panel, DOES NOT test for the novel  Coronavirus (2019 nCoV)    Coronavirus HKU1 NOT DETECTED NOT DETECTED Final   Coronavirus NL63 NOT DETECTED NOT DETECTED Final   Coronavirus OC43 NOT DETECTED NOT DETECTED Final   Metapneumovirus NOT DETECTED NOT DETECTED Final   Rhinovirus / Enterovirus NOT DETECTED NOT DETECTED Final   Influenza A NOT DETECTED NOT DETECTED Final   Influenza B NOT DETECTED NOT DETECTED Final   Parainfluenza Virus 1 NOT DETECTED NOT DETECTED Final   Parainfluenza Virus 2 NOT DETECTED NOT DETECTED Final   Parainfluenza Virus 3 NOT DETECTED NOT DETECTED Final   Parainfluenza Virus 4 NOT DETECTED NOT DETECTED Final   Respiratory Syncytial Virus NOT DETECTED NOT DETECTED Final   Bordetella pertussis NOT DETECTED NOT DETECTED Final   Bordetella Parapertussis NOT DETECTED NOT DETECTED Final   Chlamydophila pneumoniae NOT DETECTED NOT DETECTED Final   Mycoplasma pneumoniae NOT DETECTED NOT DETECTED Final    Comment: Performed at [facility], [address], [place], [place] [address]     Radiology  Studies: CT ABDOMEN PELVIS W CONTRAST Result Date: 06/13/2024 CLINICAL DATA:  sepsis, rectal pain EXAM: CT ABDOMEN AND PELVIS WITH CONTRAST TECHNIQUE: Multidetector CT imaging of the abdomen and pelvis was performed using the standard protocol following bolus administration of intravenous contrast. RADIATION DOSE REDUCTION: This exam was performed according to the departmental dose-optimization program which includes automated exposure control, adjustment of the mA and/or kV according to patient size and/or use of iterative reconstruction technique. CONTRAST:  80mL OMNIPAQUE  IOHEXOL  300 MG/ML  SOLN COMPARISON:  May 22, 2024, April 27, 2024 FINDINGS: Lower chest: No focal airspace consolidation or pleural effusion.Multifocal subsegmental atelectasis in the lung bases. Hepatobiliary: No mass.No radiopaque stones or wall thickening of the gallbladder. Mild dilation of the common bile duct, likely related to the prior cholecystectomy. The portal veins are patent. Pancreas: No mass or main ductal dilation. No peripancreatic inflammation or fluid collection. Spleen: Normal size. No mass. Adrenals/Urinary Tract: No adrenal masses. Subcentimeter hypodensity noted in the left kidney, too small to definitively characterize, but likely a small cyst. No nephrolithiasis or hydronephrosis. The urinary bladder is distended without wall thickening. Curvilinear hypodensity layering dependently within the urinary bladder (axial 79). Stomach/Bowel: The stomach is decompressed without focal abnormality. No small bowel wall thickening or inflammation. No small bowel obstruction.Normal appendix. Descending and sigmoid colonic diverticulosis. No changes of acute diverticulitis. Asymmetric wall thickening and enhancement along the rectum/anal canal. Vascular/Lymphatic: No aortic aneurysm. Scattered aortoiliac atherosclerosis. No intraabdominal or pelvic lymphadenopathy. Reproductive: Hysterectomy. No concerning adnexal mass.No  free pelvic fluid. Findings consistent with pelvic floor laxity. Other: No pneumoperitoneum, ascites, or mesenteric inflammation. Musculoskeletal: No acute fracture or destructive lesion. Osteopenia. Mild grade 1 anterolisthesis of L4 on L5. Multilevel degenerative disc disease of the spine. Thoracic DISH. IMPRESSION: 1. Asymmetric wall thickening and mucosal enhancement along the rectum/anal canal, which may represent changes of proctitis or neoplasm. Correlation with physical exam findings recommended. 2. Curvilinear hypodensity layering dependently within the urinary bladder (axial 79),  which may represent mixing artifact from excreted contrast. Correlation with urinalysis recommended to exclude blood products or infectious debris. 3. Descending and sigmoid colonic diverticulosis. No changes of acute diverticulitis. 4. Findings consistent with pelvic floor laxity. Aortic Atherosclerosis (ICD10-I70.0). Electronically Signed   By: Rogelia Myers M.D.   On: 06/13/2024 15:10   DG Chest Port 1 View Result Date: 06/13/2024 CLINICAL DATA:  Questionable sepsis - evaluate for abnormality EXAM: PORTABLE CHEST - 1 VIEW COMPARISON:  January 14, 2024, May 22, 2024 FINDINGS: Subsegmental atelectasis in the left lung base. No focal airspace consolidation, pleural effusion, or pneumothorax. No cardiomegaly. Right chest port is similarly positioned terminating at the cavoatrial junction. Tortuous aorta with aortic atherosclerosis. No acute fracture or destructive lesions. Multilevel thoracic osteophytosis. Remote, healed rib fractures. Osteoarthritis of both glenohumeral joints. IMPRESSION: No acute cardiopulmonary abnormality. Electronically Signed   By: Rogelia Myers M.D.   On: 06/13/2024 14:00    Scheduled Meds:  acyclovir   400 mg Oral Daily   apixaban   5 mg Oral BID   fluticasone  furoate-vilanterol  1 puff Inhalation Daily   gabapentin   600 mg Oral TID   hydrocortisone   25 mg Rectal BID   hydrOXYzine   10 mg  Oral QHS   pantoprazole   40 mg Oral Daily   sodium chloride  flush  3 mL Intravenous Q12H   traZODone   50 mg Oral QHS   Continuous Infusions:  ceFEPime  (MAXIPIME ) IV Stopped (06/14/24 0847)   metronidazole  500 mg (06/14/24 1244)     LOS: 1 day  MDM: Patient is high risk for one or more organ failure.  They necessitate ongoing hospitalization for continued IV therapies and subsequent lab monitoring. Total time spent interpreting labs and vitals, reviewing the medical record, coordinating care amongst consultants and care team members, directly assessing and discussing care with the patient and/or family: 55 min Susy Placzek, DO [facility]  To contact the attending physician between 7A-7P please use Epic Chat. To contact the covering physician during after hours 7P-7A, please review Amion.  06/14/2024, 1:23 PM   *This document has been created with the assistance of dictation software. Please excuse typographical errors. *

## 2024-06-14 NOTE — ED Notes (Signed)
 This RN assisted pt with toileting by putting pt on bedpan. Pt had bowel movement. Pericare performed with bathwipes. New brief put on. This RN and Con EDT repositioned pt. Pt has no other requests at this time.

## 2024-06-14 NOTE — Consult Note (Signed)
 [name] CONSULT NOTE  Patient Care Team: Center, [facility] as PCP - General (General Practice) Erin Cindy SAUNDERS, MD as Consulting Physician (Oncology)  CHIEF COMPLAINTS/PURPOSE OF CONSULTATION: Neutropenic fever  Oncology History Overview Note  # MAY 2009- DLBCL [Bx- laprscopic] s/p R-CHOP x8 [finished Oct 2009]  # FEB 2015- Recurrent DLBCL [right inguinal LN Bx-; PET- Left supra-renal LN; BMBx-neg] s/p RICE x3 [Dr.Pandit]; excellent Response; not Transplant candidate  [sec to poor social support]; s/p RT to right Inguinal LN; CT OCT 2016- NED.   # NOV 2025- DLBCL of the right neck-  NOv 2025- Lymph node, biopsy, Right cervical lymph node mass :     - LARGE B CELL LYMPHOMA;  Diagnosis Note : - Sections from the cervical lymph node show atypical lymphoid      cells with areas of necrosis and proliferation. The atypical cells are positive      for CD20, PAX-5, CD10 and BCL-6. The cells appear to be negative for BCL-2,      CYCLIN D1, CD30 and CD5. The ki-67 proliferation index is approximately 70%.      MUM-1 highlights scattered cells. The patient's history of diffuse large Bc ell    lymphoma with BCL-2 positivity is noted. The BCL-2 negative staining is     dissimilar from the patient's previous findings. A high grade B cell lymphoma      panel will be performed for further characterization and reported in an     addendum.     Flow cytometric analysis identified a clonal B cell population [id number]) and     the findings are consistent within th the above interpretation. 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    Erin [name] 78 y.o.  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  Patient received chemotherapy- R-CEOP cycle #1 on December 9th.   Patient who lives by herself-presented to the emergency room because of ongoing rectal pain and dizziness and fevers.  Patient states that she has been feeling poorly over the last 1 week postchemotherapy and also with increasing nausea with poor appetite.  She been finding difficulty ambulating and also dizzy with movements.  She admits to be constipated; and also having rectal pain in the last 1 week.  In the emergency room patient had CT scan that showed asymmetric wall thickening and mucosal enhancement along the rectum concerning for proctitis versus neoplasm.  Initially patient had low systolic blood pressures-however with IV hydration blood pressure improved to 100 over 50s.  Review of Systems  Constitutional:  Positive for malaise/fatigue and weight loss. Negative for chills, diaphoresis and fever.  HENT:  Negative for  nosebleeds and sore throat.   Eyes:  Negative for double vision.  Respiratory:  Negative for cough, hemoptysis, sputum production, shortness of breath and wheezing.   Cardiovascular:  Negative for chest pain, palpitations, orthopnea and leg swelling.  Gastrointestinal:  Positive for abdominal pain, blood in stool, constipation and nausea. Negative for heartburn, melena and vomiting.  Genitourinary:  Negative for dysuria, frequency and urgency.  Musculoskeletal:  Negative for back pain and joint  pain.  Skin: Negative.  Negative for itching and rash.  Neurological:  Negative for dizziness, tingling, focal weakness, weakness and headaches.  Endo/Heme/Allergies:  Bruises/bleeds easily.  Psychiatric/Behavioral:  Negative for depression. The patient is not nervous/anxious and does not have insomnia.     MEDICAL HISTORY:  Past Medical History:  Diagnosis Date   Acute URI    Allergy    Anxiety    Asthma    well controlled   Cancer (HCC)    lymphoma in remission   Diverticulosis    DVT of upper extremity (deep vein thrombosis) (HCC) 07/2008 & 12/2007   Gastric ulcer 10/2007   EGD   GERD (gastroesophageal reflux disease)    Hepatitis B    treated   History of hiatal hernia    Hypertension    Laboratory confirmed diagnosis of COVID-19 06/27/2019   Lower extremity edema    Lymphoma (HCC)    Stage 3 large B cell lymphoma    SURGICAL HISTORY: Past Surgical History:  Procedure Laterality Date   ANTERIOR AND POSTERIOR REPAIR N/A 03/17/2020   Procedure: ANTERIOR (CYSTOCELE) AND POSTERIOR REPAIR (RECTOCELE);  Surgeon: Connell Davies, MD;  Location: [facility] ORS;  Service: Gynecology;  Laterality: N/A;   CHOLECYSTECTOMY     COLONOSCOPY  11/23/2007, 05/12/1994   COLONOSCOPY WITH PROPOFOL  N/A 03/31/2018   Procedure: COLONOSCOPY WITH PROPOFOL ;  Surgeon: Viktoria Lamar DASEN, MD;  Location: [facility] ENDOSCOPY;  Service: Endoscopy;  Laterality: N/A;   ESOPHAGOGASTRODUODENOSCOPY  09/03/2009, 10/14/2008, 09/30/2008, 11/23/2007   HEMORRHOID SURGERY     LAPAROSCOPIC VAGINAL HYSTERECTOMY WITH SALPINGO OOPHORECTOMY Bilateral 03/17/2020   Procedure: LAPAROSCOPIC ASSISTED VAGINAL HYSTERECTOMY WITH SALPINGO OOPHORECTOMY;  Surgeon: Connell Davies, MD;  Location: [facility] ORS;  Service: Gynecology;  Laterality: Bilateral;   LIVER BIOPSY  01/27/1996   PORTOCAVAL SHUNT PLACEMENT     and removal   PUBOVAGINAL SLING N/A 03/17/2020   Procedure: PUBO-VAGINAL SLING-TVT;  Surgeon: Connell Davies, MD;  Location: [facility] ORS;   Service: Gynecology;  Laterality: N/A;    SOCIAL HISTORY: Social History   Socioeconomic History   Marital status: Divorced    Spouse name: Not on file   Number of children: Not on file   Years of education: Not on file   Highest education level: Not on file  Occupational History   Not on file  Tobacco Use   Smoking status: Never   Smokeless tobacco: Never  Vaping Use   Vaping status: Never Used  Substance and Sexual Activity   Alcohol use: No   Drug use: Never   Sexual activity: Not Currently  Other Topics Concern   Not on file  Social History Narrative   Independent at baseline, ambulates without any support   Lives by herself   Social Drivers of Health   Tobacco Use: Low Risk (06/04/2024)   Patient History    Smoking Tobacco Use: Never    Smokeless Tobacco Use: Never    Passive Exposure: Not on file  Financial Resource Strain: Low Risk  (02/09/2024)   Received from [facility]  Overall Financial Resource Strain (CARDIA)    Difficulty of Paying Living Expenses: Not hard at all  Food Insecurity: No Food Insecurity (06/14/2024)   Epic    Worried About Programme Researcher, Broadcasting/film/video in the Last Year: Never true    Ran Out of Food in the Last Year: Never true  Transportation Needs: No Transportation Needs (06/14/2024)   Epic    Lack of Transportation (Medical): No    Lack of Transportation (Non-Medical): No  Physical Activity: Not on file  Stress: Not on file  Social Connections: Moderately Isolated (06/14/2024)   Social Connection and Isolation Panel    Frequency of Communication with Friends and Family: Twice a week    Frequency of Social Gatherings with Friends and Family: More than three times a week    Attends Religious Services: 1 to 4 times per year    Active Member of [employer] or Organizations: No    Attends Banker Meetings: Never    Marital Status: Divorced  Catering Manager Violence: Not At Risk (06/14/2024)   Epic    Fear of  Current or Ex-Partner: No    Emotionally Abused: No    Physically Abused: No    Sexually Abused: No  Depression (PHQ2-9): Low Risk (06/07/2024)   Depression (PHQ2-9)    PHQ-2 Score: 0  Alcohol Screen: Not on file  Housing: Unknown (06/14/2024)   Epic    Unable to Pay for Housing in the Last Year: Patient declined    Number of Times Moved in the Last Year: 0    Homeless in the Last Year: Patient declined  Utilities: Not At Risk (06/14/2024)   Epic    Threatened with loss of utilities: No  Health Literacy: Not on file    FAMILY HISTORY: Family History  Problem Relation Age of Onset   Cancer Daughter    Asthma Mother    Breast cancer Neg Hx     ALLERGIES:  is allergic to penicillins, prednisone , sulfa antibiotics, enalapril, and medrol  [methylprednisolone ].  MEDICATIONS:  Current Facility-Administered Medications  Medication Dose Route Frequency Provider Last Rate Last Admin   acetaminophen  (TYLENOL ) tablet 650 mg  650 mg Oral Q6H PRN Khan, Ghalib, MD   650 mg at 06/13/24 2337   Or   acetaminophen  (TYLENOL ) suppository 650 mg  650 mg Rectal Q6H PRN Khan, Ghalib, MD       acyclovir  (ZOVIRAX ) 200 MG capsule 400 mg  400 mg Oral Daily Khan, Ghalib, MD   400 mg at 06/14/24 1029   apixaban  (ELIQUIS ) tablet 5 mg  5 mg Oral BID Khan, Ghalib, MD   5 mg at 06/14/24 1029   ceFEPIme  (MAXIPIME ) 2 g in sodium chloride  0.9 % 100 mL IVPB  2 g Intravenous Q12H Perley Lum DEL, [place]   Stopped at 06/14/24 9152   fluticasone  furoate-vilanterol (BREO ELLIPTA ) 200-25 MCG/ACT 1 puff  1 puff Inhalation Daily Fernand Prost, MD   1 puff at 06/14/24 1308   gabapentin  (NEURONTIN ) capsule 600 mg  600 mg Oral TID Khan, Ghalib, MD   600 mg at 06/14/24 1707   hydrocortisone  (ANUSOL -HC) suppository 25 mg  25 mg Rectal BID Khan, Ghalib, MD   25 mg at 06/14/24 1718   hydrOXYzine  (ATARAX ) tablet 10 mg  10 mg Oral QHS Khan, Ghalib, MD   10 mg at 06/13/24 2120   ipratropium-albuterol  (DUONEB) 0.5-2.5 (3) MG/3ML  nebulizer solution 3 mL  3 mL Nebulization Q6H PRN Fernand Prost, MD   3 mL at  06/13/24 2120   metroNIDAZOLE  (FLAGYL ) IVPB 500 mg  500 mg Intravenous Q12H Fernand Prost, MD   Stopped at 06/14/24 1708   ondansetron  (ZOFRAN ) tablet 4 mg  4 mg Oral Q6H PRN Fernand Prost, MD       Or   ondansetron  (ZOFRAN ) injection 4 mg  4 mg Intravenous Q6H PRN Fernand Prost, MD       pantoprazole  (PROTONIX ) EC tablet 40 mg  40 mg Oral Daily Khan, Ghalib, MD   40 mg at 06/14/24 1029   senna-docusate (Senokot-S) tablet 1 tablet  1 tablet Oral QHS PRN Fernand Prost, MD       sodium chloride  flush (NS) 0.9 % injection 3 mL  3 mL Intravenous Q12H Khan, Ghalib, MD   3 mL at 06/14/24 1117   traZODone  (DESYREL ) tablet 50 mg  50 mg Oral QHS Khan, Ghalib, MD   50 mg at 06/13/24 2120   Facility-Administered Medications Ordered in Other Encounters  Medication Dose Route Frequency Provider Last Rate Last Admin   sodium chloride  0.9 % injection 10 mL  10 mL Intravenous PRN Wilder Pink, MD   10 mL at 12/18/14 1121    PHYSICAL EXAMINATION:   Vitals:   06/14/24 1600 06/14/24 2010  BP: (!) 96/54 (!) 103/40  Pulse: 90 89  Resp: 16 16  Temp: 98.5 F (36.9 C) 98.1 F (36.7 C)  SpO2: 99% 94%   Filed Weights   06/13/24 1147  Weight: 159 lb (72.1 kg)    Physical Exam Vitals and nursing note reviewed.  HENT:     Head: Normocephalic and atraumatic.     Mouth/Throat:     Pharynx: Oropharynx is clear.  Eyes:     Extraocular Movements: Extraocular movements intact.     Pupils: Pupils are equal, round, and reactive to light.  Cardiovascular:     Rate and Rhythm: Regular rhythm. Tachycardia present.  Pulmonary:     Comments: Decreased breath sounds bilaterally.  Abdominal:     Palpations: Abdomen is soft.     Tenderness: There is abdominal tenderness.  Musculoskeletal:        General: Normal range of motion.     Cervical back: Normal range of motion.  Skin:    General: Skin is warm.  Neurological:     General: No  focal deficit present.     Mental Status: She is alert and oriented to person, place, and time.  Psychiatric:        Behavior: Behavior normal.        Judgment: Judgment normal.     LABORATORY DATA:  I have reviewed the data as listed Lab Results  Component Value Date   WBC 1.6 (L) 06/14/2024   HGB 9.0 (L) 06/14/2024   HCT 27.6 (L) 06/14/2024   MCV 96.8 06/14/2024   PLT 52 (L) 06/14/2024   Recent Labs    01/20/24 0432 04/24/24 1337 05/16/24 1058 06/04/24 0806 06/13/24 1155 06/14/24 0724  NA  --    < > 136 138 133* 139  K  --    < > 3.9 3.8 3.4* 3.5  CL  --    < > 100 100 97* 108  CO2  --    < > 28 28 28 24   GLUCOSE  --    < > 105* 131* 101* 78  BUN  --    < > 23 22 20 19   CREATININE  --    < > 1.62* 1.60* 1.35* 1.15*  CALCIUM   --    < >  9.1 9.4 8.5* 7.5*  GFRNONAA  --    < > 33* 33* 40* 49*  PROT 6.2*   < > 7.3 6.5 6.0*  --   ALBUMIN 2.9*   < > 3.5 3.9 3.6  --   AST 34   < > 18 18 16   --   ALT 23   < > 12 15 18   --   ALKPHOS 64   < > 83 80 70  --   BILITOT 0.3   < > 0.4 0.3 0.5  --   BILIDIR 0.1  --   --   --   --   --   IBILI 0.2*  --   --   --   --   --    < > = values in this interval not displayed.    RADIOGRAPHIC STUDIES: I have personally reviewed the radiological images as listed and agreed with the findings in the report. CT ABDOMEN PELVIS W CONTRAST Result Date: 06/13/2024 CLINICAL DATA:  sepsis, rectal pain EXAM: CT ABDOMEN AND PELVIS WITH CONTRAST TECHNIQUE: Multidetector CT imaging of the abdomen and pelvis was performed using the standard protocol following bolus administration of intravenous contrast. RADIATION DOSE REDUCTION: This exam was performed according to the departmental dose-optimization program which includes automated exposure control, adjustment of the mA and/or kV according to patient size and/or use of iterative reconstruction technique. CONTRAST:  80mL OMNIPAQUE  IOHEXOL  300 MG/ML  SOLN COMPARISON:  May 22, 2024, April 27, 2024  FINDINGS: Lower chest: No focal airspace consolidation or pleural effusion.Multifocal subsegmental atelectasis in the lung bases. Hepatobiliary: No mass.No radiopaque stones or wall thickening of the gallbladder. Mild dilation of the common bile duct, likely related to the prior cholecystectomy. The portal veins are patent. Pancreas: No mass or main ductal dilation. No peripancreatic inflammation or fluid collection. Spleen: Normal size. No mass. Adrenals/Urinary Tract: No adrenal masses. Subcentimeter hypodensity noted in the left kidney, too small to definitively characterize, but likely a small cyst. No nephrolithiasis or hydronephrosis. The urinary bladder is distended without wall thickening. Curvilinear hypodensity layering dependently within the urinary bladder (axial 79). Stomach/Bowel: The stomach is decompressed without focal abnormality. No small bowel wall thickening or inflammation. No small bowel obstruction.Normal appendix. Descending and sigmoid colonic diverticulosis. No changes of acute diverticulitis. Asymmetric wall thickening and enhancement along the rectum/anal canal. Vascular/Lymphatic: No aortic aneurysm. Scattered aortoiliac atherosclerosis. No intraabdominal or pelvic lymphadenopathy. Reproductive: Hysterectomy. No concerning adnexal mass.No free pelvic fluid. Findings consistent with pelvic floor laxity. Other: No pneumoperitoneum, ascites, or mesenteric inflammation. Musculoskeletal: No acute fracture or destructive lesion. Osteopenia. Mild grade 1 anterolisthesis of L4 on L5. Multilevel degenerative disc disease of the spine. Thoracic DISH. IMPRESSION: 1. Asymmetric wall thickening and mucosal enhancement along the rectum/anal canal, which may represent changes of proctitis or neoplasm. Correlation with physical exam findings recommended. 2. Curvilinear hypodensity layering dependently within the urinary bladder (axial 79), which may represent mixing artifact from excreted contrast.  Correlation with urinalysis recommended to exclude blood products or infectious debris. 3. Descending and sigmoid colonic diverticulosis. No changes of acute diverticulitis. 4. Findings consistent with pelvic floor laxity. Aortic Atherosclerosis (ICD10-I70.0). Electronically Signed   By: Rogelia Myers M.D.   On: 06/13/2024 15:10   DG Chest Port 1 View Result Date: 06/13/2024 CLINICAL DATA:  Questionable sepsis - evaluate for abnormality EXAM: PORTABLE CHEST - 1 VIEW COMPARISON:  January 14, 2024, May 22, 2024 FINDINGS: Subsegmental atelectasis in the left lung base. No focal airspace consolidation,  pleural effusion, or pneumothorax. No cardiomegaly. Right chest port is similarly positioned terminating at the cavoatrial junction. Tortuous aorta with aortic atherosclerosis. No acute fracture or destructive lesions. Multilevel thoracic osteophytosis. Remote, healed rib fractures. Osteoarthritis of both glenohumeral joints. IMPRESSION: No acute cardiopulmonary abnormality. Electronically Signed   By: Rogelia Myers M.D.   On: 06/13/2024 14:00   NM PET Image Initial (PI) Skull Base To Thigh (F-18 FDG) Result Date: 05/22/2024 CLINICAL DATA:  Subsequent treatment strategy for lymphoma. Right cervical adenopathy on recent CT. 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 EXAM: NUCLEAR MEDICINE PET SKULL BASE TO THIGH TECHNIQUE: 8.96 mCi F-18 FDG was injected intravenously. Full-ring PET imaging was performed from the skull base to thigh after the radiotracer. CT data was obtained and used for attenuation correction and anatomic localization. Fasting blood glucose: 133 mg/dl COMPARISON:  PET-CT 97/89/7983. CT of the neck, chest, abdomen and pelvis 04/27/2024. FINDINGS: Mediastinal blood pool activity: SUV max 3.0 Liver activity: SUV max 4.4 NECK: The recently demonstrated and biopsied mass in the right neck demonstrates marked hypermetabolic activity (SUV max 31.4). This mass involves level IIa lymph nodes and measures 4.8 x 3.2  cm on image 17/2. No other hypermetabolic cervical lymph nodes are identified.Fairly symmetric activity within the lymphoid tissue of Waldeyer's ring is within physiologic limits. No suspicious activity identified within the pharyngeal mucosal space. Incidental CT findings: none CHEST: There are no hypermetabolic mediastinal, hilar or axillary lymph nodes. No hypermetabolic pulmonary activity or suspicious nodularity. Incidental CT findings: Stable atelectasis or scarring at both lung bases. Right IJ Port-A-Cath extends to the superior cavoatrial junction. Mild atherosclerosis of the aorta, great vessels and coronary arteries. ABDOMEN/PELVIS: There is no hypermetabolic activity within the liver, adrenal glands, spleen or pancreas. There is no hypermetabolic nodal activity in the abdomen or pelvis. Incidental CT findings: Status post cholecystectomy. Scattered calcified splenic granulomas. Moderate diverticulosis of the descending and sigmoid colon without evidence of acute inflammation. Previous hysterectomy without evidence of adnexal mass. Mild aortoiliac atherosclerosis. SKELETON: There is no hypermetabolic activity to suggest osseous metastatic disease. Incidental CT findings: Relatively mild multilevel spondylosis. IMPRESSION: 1. The recently demonstrated and biopsied right cervical mass demonstrates marked hypermetabolic activity consistent with recurrent lymphoma ([place] 5). 2. No other evidence of recurrent lymphoma in the neck, chest, abdomen or pelvis. 3.  Aortic Atherosclerosis (ICD10-I70.0). Electronically Signed   By: Elsie Perone M.D.   On: 05/22/2024 13:01    No problem-specific Assessment & Plan notes found for this encounter.  Assessment and plan:  # 78 year old female patient with a history of diffuse B-cell lymphoma currently on chemotherapy admitted to hospital for neutropenic fever/rectal pain.  # Neutropenic fever/anemia thrombocytopenia-status post chemotherapy  R- CEOP cycle #1  on December 9th-patient status post G-CSF.  Hold off any further growth factor. Currently on IV antibiotics-cefepime .  Blood cultures pending.  On admission CT scan-suggestive of proctitis.  Unlikely any concern for neoplasm as recent PET scan-in November negative for any rectal abnormality.  Continue antibiotics.  # Diffuse B-cell lymphoma-status post cycle number one of R- CEOP chemotherapy-consider dose reductions in future chemotherapy.  # Thank you for allowing me to participate in the care of your pleasant patient. Please do not hesitate to contact me with questions or concerns in the interim. Above plan of care was discussed with patient  in detail.      Cindy JONELLE Joe, MD 06/14/2024 8:21 PM

## 2024-06-14 NOTE — Progress Notes (Signed)
 PT Cancellation Note  Patient Details Name: Erin [name] MRN: [id number] DOB: May 22, 1946   Cancelled Treatment:    Reason Eval/Treat Not Completed: Other (comment): Upon entering room nursing found to be preparing patient for transport to the units.  Will attempt to see pt at a future date/time as medically appropriate.    CHARM Glendia Bertin PT, DPT 06/14/2024, 3:41 PM

## 2024-06-14 NOTE — ED Notes (Signed)
Lab called to get labs.

## 2024-06-14 NOTE — Telephone Encounter (Signed)

## 2024-06-14 NOTE — ED Notes (Signed)
 Upon assessment of IV on right arm, this RN noticed a bruise beside the IV site. Pt denies pain. No infusions going at this time. This RN unable to draw blood back or flush the IV. IV will be removed.

## 2024-06-15 ENCOUNTER — Inpatient Hospital Stay

## 2024-06-15 DIAGNOSIS — K219 Gastro-esophageal reflux disease without esophagitis: Secondary | ICD-10-CM | POA: Diagnosis not present

## 2024-06-15 DIAGNOSIS — F419 Anxiety disorder, unspecified: Secondary | ICD-10-CM | POA: Diagnosis not present

## 2024-06-15 DIAGNOSIS — D709 Neutropenia, unspecified: Secondary | ICD-10-CM | POA: Diagnosis not present

## 2024-06-15 DIAGNOSIS — Z880 Allergy status to penicillin: Secondary | ICD-10-CM

## 2024-06-15 DIAGNOSIS — N1832 Chronic kidney disease, stage 3b: Secondary | ICD-10-CM | POA: Diagnosis not present

## 2024-06-15 DIAGNOSIS — R5081 Fever presenting with conditions classified elsewhere: Secondary | ICD-10-CM | POA: Diagnosis not present

## 2024-06-15 LAB — CBC WITH DIFFERENTIAL/PLATELET
Abs Immature Granulocytes: 0.44 K/uL — ABNORMAL HIGH (ref 0.00–0.07)
Basophils Absolute: 0.1 K/uL (ref 0.0–0.1)
Basophils Relative: 2 %
Eosinophils Absolute: 0 K/uL (ref 0.0–0.5)
Eosinophils Relative: 0 %
HCT: 29.1 % — ABNORMAL LOW (ref 36.0–46.0)
Hemoglobin: 9.5 g/dL — ABNORMAL LOW (ref 12.0–15.0)
Immature Granulocytes: 9 %
Lymphocytes Relative: 13 %
Lymphs Abs: 0.6 K/uL — ABNORMAL LOW (ref 0.7–4.0)
MCH: 30.4 pg (ref 26.0–34.0)
MCHC: 32.6 g/dL (ref 30.0–36.0)
MCV: 93.3 fL (ref 80.0–100.0)
Monocytes Absolute: 0.5 K/uL (ref 0.1–1.0)
Monocytes Relative: 10 %
Neutro Abs: 3.1 K/uL (ref 1.7–7.7)
Neutrophils Relative %: 66 %
Platelets: 72 K/uL — ABNORMAL LOW (ref 150–400)
RBC: 3.12 MIL/uL — ABNORMAL LOW (ref 3.87–5.11)
RDW: 14.1 % (ref 11.5–15.5)
Smear Review: NORMAL
WBC: 4.8 K/uL (ref 4.0–10.5)
nRBC: 0.4 % — ABNORMAL HIGH (ref 0.0–0.2)

## 2024-06-15 LAB — COMPREHENSIVE METABOLIC PANEL WITH GFR
ALT: 13 U/L (ref 0–44)
AST: <10 U/L — ABNORMAL LOW (ref 15–41)
Albumin: 2.8 g/dL — ABNORMAL LOW (ref 3.5–5.0)
Alkaline Phosphatase: 61 U/L (ref 38–126)
Anion gap: 7 (ref 5–15)
BUN: 15 mg/dL (ref 8–23)
CO2: 20 mmol/L — ABNORMAL LOW (ref 22–32)
Calcium: 7.9 mg/dL — ABNORMAL LOW (ref 8.9–10.3)
Chloride: 110 mmol/L (ref 98–111)
Creatinine, Ser: 1.08 mg/dL — ABNORMAL HIGH (ref 0.44–1.00)
GFR, Estimated: 53 mL/min — ABNORMAL LOW
Glucose, Bld: 87 mg/dL (ref 70–99)
Potassium: 3.5 mmol/L (ref 3.5–5.1)
Sodium: 138 mmol/L (ref 135–145)
Total Bilirubin: 0.3 mg/dL (ref 0.0–1.2)
Total Protein: 5.1 g/dL — ABNORMAL LOW (ref 6.5–8.1)

## 2024-06-15 LAB — GLUCOSE, CAPILLARY: Glucose-Capillary: 88 mg/dL (ref 70–99)

## 2024-06-15 NOTE — Evaluation (Signed)
 Occupational Therapy Evaluation Patient Details Name: Erin [name] MRN: [id number] DOB: 04-Dec-1945 Today's Date: 06/15/2024   History of Present Illness   Pt is a 78 y.o. female presenting to ED with rectal pain, dizziness and fevers. MD assessment includes neutropenic fever. 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     Clinical Impressions Pt was seen for OT evaluation this date. PTA, pt resides in a 3rd level apartment with elevator access. Reports she is typically MOD I with ambulation using a rollator and for ADLs. Her daughter brings her groceries and manages her meds. Pt presents with deficits in strength and activity tolerance limiting their ability to perform ADL management at baseline level. Noted with R hand swelling and feeling funny nurse and MD notified. Pt currently requires supervision for bed mobility. ADL transfers and mobility during session with CGA and RW use ~15 ft. Standing peri-care required Max A after cont use of toilet. Pt feeling generally weak and mild lightheadedness at end of session. Recommend rehab <3 hrs/day to maximize safety and return to PLOF vs caregiver support upon DC home. Will follow acutely.       If plan is discharge home, recommend the following:   A little help with walking and/or transfers;Assistance with cooking/housework;Help with stairs or ramp for entrance;A lot of help with bathing/dressing/bathroom     Functional Status Assessment   Patient has had a recent decline in their functional status and demonstrates the ability to make significant improvements in function in a reasonable and predictable amount of time.     Equipment Recommendations   Other (comment) (defer)     Recommendations for Other Services         Precautions/Restrictions   Precautions Precautions: Fall Recall of Precautions/Restrictions:  Impaired Restrictions Weight Bearing Restrictions Per Provider Order: No (WBAT on RLE from fx back in July)     Mobility Bed Mobility Overal bed mobility: Needs Assistance Bed Mobility: Supine to Sit     Supine to sit: Supervision, Used rails, HOB elevated     General bed mobility comments: increased time/effort, no physical assist    Transfers Overall transfer level: Needs assistance Equipment used: Rolling walker (2 wheels) Transfers: Sit to/from Stand Sit to Stand: Contact guard assist           General transfer comment: stands from lowest bed height with cues for hand placement and from comfort height toilet; ADL transfer to bathroom and back to recliner using RW with CGA      Balance Overall balance assessment: Needs assistance Sitting-balance support: Feet supported Sitting balance-[name] Scale: Good     Standing balance support: Reliant on assistive device for balance, Bilateral upper extremity supported Standing balance-[name] Scale: Fair Standing balance comment: RW                           ADL either performed or assessed with clinical judgement   ADL Overall ADL's : Needs assistance/impaired     Grooming: Wash/dry hands;Contact guard assist;Standing;Supervision/safety                   Toilet Transfer: Contact guard assist;Comfort height toilet;Rolling walker (2 wheels)   Toileting- Clothing Manipulation and Hygiene: Maximal assistance;Sit to/from stand       Functional mobility during ADLs: Contact guard assist;Rolling walker (2 wheels)       Vision  Perception         Praxis         Pertinent Vitals/Pain Pain Assessment Pain Assessment: No/denies pain     Extremity/Trunk Assessment Upper Extremity Assessment Upper Extremity Assessment: Generalized weakness   Lower Extremity Assessment Lower Extremity Assessment: Generalized weakness       Communication Communication Communication: No apparent  difficulties   Cognition Arousal: Alert Behavior During Therapy: WFL for tasks assessed/performed               OT - Cognition Comments: confused                 Following commands: Intact       Cueing  General Comments      reports lightheadedness at end of session that reports improves with rest; R hand swelling and weird feeling reported to nurse   Exercises Other Exercises Other Exercises: Edu on role of OT in acute setting.   Shoulder Instructions      Home Living Family/patient expects to be discharged to:: Private residence [facility]) Living Arrangements: Alone Available Help at Discharge: Family;Available PRN/intermittently Type of Home: Apartment (3rd level) Home Access: Elevator     Home Layout: One level     Bathroom Shower/Tub: Producer, Television/film/video: Standard     Home Equipment: Shower seat - built in;Grab bars - tub/shower;Rollator (4 wheels)          Prior Functioning/Environment Prior Level of Function : Independent/Modified Independent             Mobility Comments: 4 sons and 2 daughters; daughter Amy lives in [place] and checks on her when she can/brings her groceries; h/o falls ADLs Comments: indep, seated shower, doesn't drive, uses [employer] transit for dr appts, dtr manages meds with pill organizer    OT Problem List: Decreased strength;Decreased activity tolerance   OT Treatment/Interventions: Self-care/ADL training;Therapeutic exercise;Patient/family education;Balance training;Therapeutic activities;Energy conservation;DME and/or AE instruction      OT Goals(Current goals can be found in the care plan section)   Acute Rehab OT Goals Patient Stated Goal: feel better OT Goal Formulation: With patient Time For Goal Achievement: 06/29/24 Potential to Achieve Goals: Good ADL Goals Pt Will Perform Lower Body Dressing: with supervision;sit to/from stand;sitting/lateral leans Pt Will Transfer to Toilet:  with supervision;ambulating Pt Will Perform Toileting - Clothing Manipulation and hygiene: with supervision;sit to/from stand;sitting/lateral leans Additional ADL Goal #1: Pt will demo implementation of 1 learned ECS and falls prevention strategy to maximize safety and prevent overexertion.   OT Frequency:  Min 2X/week    Co-evaluation              AM-PAC OT 6 Clicks Daily Activity     Outcome Measure Help from another person eating meals?: A Little Help from another person taking care of personal grooming?: A Little Help from another person toileting, which includes using toliet, bedpan, or urinal?: A Lot Help from another person bathing (including washing, rinsing, drying)?: A Lot Help from another person to put on and taking off regular upper body clothing?: A Little Help from another person to put on and taking off regular lower body clothing?: A Lot 6 Click Score: 15   End of Session Equipment Utilized During Treatment: Rolling walker (2 wheels) Nurse Communication: Mobility status  Activity Tolerance: Patient tolerated treatment well Patient left: in chair;with call bell/phone within reach;with chair alarm set  OT Visit Diagnosis: Other abnormalities of gait and mobility (R26.89);Muscle weakness (generalized) (M62.81)  Time: 9157-9081 OT Time Calculation (min): 36 min Charges:  OT General Charges $OT Visit: 1 Visit OT Evaluation $OT Eval Moderate Complexity: 1 Mod OT Treatments $Self Care/Home Management : 8-22 mins Erin [name], OTR/L 06/15/2024, 10:46 AM  Erin [name] 06/15/2024, 10:44 AM

## 2024-06-15 NOTE — Progress Notes (Signed)
 The above named patient is recommended to go to Short Term Rehab for strengthening and gait training for balance.  It is expected that the Short Term Rehab stay will be less than 30 days.  The patient is expected to return home after Rehab.

## 2024-06-15 NOTE — TOC Progression Note (Signed)
 Transition of Care [facility]) - Progression Note    Patient Details  Name: Erin [name] MRN: [id number] Date of Birth: [birth year]  Transition of Care [facility]) CM/SW Contact  Dalia GORMAN Fuse, RN Phone Number: 06/15/2024, 1:32 PM  Clinical Narrative:    PASRR screening requested, requiring additional review. H&P, Progress Notes, Signed 30 Day Note, and Signed FL2 uploaded in [place] [facility]. FL2 completed and sent out in [place]                     Expected Discharge Plan and Services                                               Social Drivers of Health (SDOH) Interventions SDOH Screenings   Food Insecurity: No Food Insecurity (06/14/2024)  Housing: Unknown (06/14/2024)  Transportation Needs: No Transportation Needs (06/14/2024)  Utilities: Not At Risk (06/14/2024)  Depression (PHQ2-9): Low Risk (06/07/2024)  Financial Resource Strain: Low Risk  (02/09/2024)   Received from [facility]  Social Connections: Moderately Isolated (06/14/2024)  Tobacco Use: Low Risk (06/04/2024)    Readmission Risk Interventions     No data to display

## 2024-06-15 NOTE — Plan of Care (Signed)
" °  Problem: Clinical Measurements: Goal: Ability to maintain clinical measurements within normal limits will improve Outcome: Progressing   Problem: Coping: Goal: Level of anxiety will decrease Outcome: Progressing   Problem: Safety: Goal: Ability to remain free from injury will improve Outcome: Progressing   Problem: Elimination: Goal: Will not experience complications related to bowel motility Outcome: Progressing   Problem: Skin Integrity: Goal: Risk for impaired skin integrity will decrease Outcome: Progressing   "

## 2024-06-15 NOTE — Progress Notes (Signed)
 INFECTIOUS DISEASE PROGRESS NOTE Date of Admission:  06/13/2024     ID: Erin [name] is a 78 y.o. female with  Principal Problem:   Neutropenic fever Active Problems:   Lymphoma (HCC)   CKD (chronic kidney disease) stage 3, GFR 30-59 ml/min (HCC)   GERD without esophagitis   Anxiety and depression   Chronic asthma   Subjective: Fever has resolved.  White count is up to 4.8. Blood cultures from admission are negative.  C. difficile testing is negative.  ROS  Eleven systems are reviewed and negative except per hpi  Medications:  Antibiotics Given (last 72 hours)     Date/Time Action Medication Dose Rate   06/13/24 1219 New Bag/Given   aztreonam  (AZACTAM ) 2 g in sodium chloride  0.9 % 100 mL IVPB 2 g 200 mL/hr   06/13/24 1226 New Bag/Given   metroNIDAZOLE  (FLAGYL ) IVPB 500 mg 500 mg 100 mL/hr   06/13/24 1319 New Bag/Given   vancomycin  (VANCOCIN ) IVPB 1000 mg/200 mL premix 1,000 mg 200 mL/hr   06/13/24 1917 New Bag/Given   ceFEPIme  (MAXIPIME ) 2 g in sodium chloride  0.9 % 100 mL IVPB 2 g 200 mL/hr   06/13/24 2027 Given   acyclovir  (ZOVIRAX ) 200 MG capsule 400 mg 400 mg    06/13/24 2335 New Bag/Given   metroNIDAZOLE  (FLAGYL ) IVPB 500 mg 500 mg 100 mL/hr   06/14/24 0817 New Bag/Given   ceFEPIme  (MAXIPIME ) 2 g in sodium chloride  0.9 % 100 mL IVPB 2 g 200 mL/hr   06/14/24 1029 Given   acyclovir  (ZOVIRAX ) 200 MG capsule 400 mg 400 mg    06/14/24 1244 New Bag/Given   metroNIDAZOLE  (FLAGYL ) IVPB 500 mg 500 mg 100 mL/hr   06/14/24 2257 New Bag/Given   ceFEPIme  (MAXIPIME ) 2 g in sodium chloride  0.9 % 100 mL IVPB 2 g 200 mL/hr   06/15/24 0027 New Bag/Given   metroNIDAZOLE  (FLAGYL ) IVPB 500 mg 500 mg 100 mL/hr   06/15/24 0930 New Bag/Given   ceFEPIme  (MAXIPIME ) 2 g in sodium chloride  0.9 % 100 mL IVPB 2 g 200 mL/hr   06/15/24 1024 Given   acyclovir  (ZOVIRAX ) 200 MG capsule 400 mg 400 mg    06/15/24 1216 New Bag/Given   metroNIDAZOLE  (FLAGYL ) IVPB 500 mg 500 mg 100 mL/hr        acyclovir   400 mg Oral Daily   apixaban   5 mg Oral BID   fluticasone  furoate-vilanterol  1 puff Inhalation Daily   gabapentin   600 mg Oral TID   hydrocortisone   25 mg Rectal BID   hydrOXYzine   10 mg Oral QHS   pantoprazole   40 mg Oral Daily   sodium chloride  flush  3 mL Intravenous Q12H   traZODone   50 mg Oral QHS    Objective: Vital signs in last 24 hours: Temp:  [97.6 F (36.4 C)-99.1 F (37.3 C)] 97.6 F (36.4 C) (12/19 1147) Pulse Rate:  [81-98] 81 (12/19 1147) Resp:  [16-25] 18 (12/19 1147) BP: (91-109)/(40-87) 105/55 (12/19 1147) SpO2:  [93 %-99 %] 98 % (12/19 1147) Weight:  [73.2 kg] 73.2 kg (12/19 0500) Constitutional:  oriented to person, place, and time. frail HENT: [place]/AT, PERRLA, no scleral icterus Mouth/Throat: Oropharynx is clear and moist.   No mucositis, no thrush No oropharyngeal exudate.  Cardiovascular: Normal rate, regular rhythm and normal heart sounds. Exam reveals no gallop and no friction rub.  No murmur heard.  Pulmonary/Chest: Effort normal and breath sounds normal. No respiratory distress.  has no wheezes.  Neck =  supple, no nuchal rigidity Abdominal: Soft. Bowel sounds are normal.  exhibits no distension. There is no tenderness.  Lymphadenopathy: no cervical adenopathy. No axillary adenopathy Neurological: alert and oriented to person, place, and time.  Skin: Skin is warm and dry. No rash noted. No erythema.   Psychiatric: a normal mood and affect.  behavior is normal.      Lab Results Recent Labs    06/14/24 0724 06/15/24 0809  WBC 1.6* 4.8  HGB 9.0* 9.5*  HCT 27.6* 29.1*  NA 139 138  K 3.5 3.5  CL 108 110  CO2 24 20*  BUN 19 15  CREATININE 1.15* 1.08*    Microbiology: Results for orders placed or performed during the hospital encounter of 06/13/24  Resp panel by RT-PCR (RSV, Flu A&B, Covid) Anterior Nasal Swab     Status: None   Collection Time: 06/13/24 11:56 AM   Specimen: Anterior Nasal Swab  Result Value Ref Range Status    SARS Coronavirus 2 by RT PCR NEGATIVE NEGATIVE Final    Comment: (NOTE) SARS-CoV-2 target nucleic acids are NOT DETECTED.  The SARS-CoV-2 RNA is generally detectable in upper respiratory specimens during the acute phase of infection. The lowest concentration of SARS-CoV-2 viral copies this assay can detect is 138 copies/mL. A negative result does not preclude SARS-Cov-2 infection and should not be used as the sole basis for treatment or other patient management decisions. A negative result may occur with  improper specimen collection/handling, submission of specimen other than nasopharyngeal swab, presence of viral mutation(s) within the areas targeted by this assay, and inadequate number of viral copies(<138 copies/mL). A negative result must be combined with clinical observations, patient history, and epidemiological information. The expected result is Negative.  Fact Sheet for Patients:  bloggercourse.com  Fact Sheet for Healthcare Providers:  seriousbroker.it  This test is no t yet approved or cleared by the [place]  FDA and  has been authorized for detection and/or diagnosis of SARS-CoV-2 by FDA under an Emergency Use Authorization (EUA). This EUA will remain  in effect (meaning this test can be used) for the duration of the COVID-19 declaration under Section 564(b)(1) of the Act, 21 U.S.C.section 360bbb-3(b)(1), unless the authorization is terminated  or revoked sooner.       Influenza A by PCR NEGATIVE NEGATIVE Final   Influenza B by PCR NEGATIVE NEGATIVE Final    Comment: (NOTE) The Xpert Xpress SARS-CoV-2/FLU/RSV plus assay is intended as an aid in the diagnosis of influenza from Nasopharyngeal swab specimens and should not be used as a sole basis for treatment. Nasal washings and aspirates are unacceptable for Xpert Xpress SARS-CoV-2/FLU/RSV testing.  Fact Sheet for  Patients: bloggercourse.com  Fact Sheet for Healthcare Providers: seriousbroker.it  This test is not yet approved or cleared by the [place]  FDA and has been authorized for detection and/or diagnosis of SARS-CoV-2 by FDA under an Emergency Use Authorization (EUA). This EUA will remain in effect (meaning this test can be used) for the duration of the COVID-19 declaration under Section 564(b)(1) of the Act, 21 U.S.C. section 360bbb-3(b)(1), unless the authorization is terminated or revoked.     Resp Syncytial Virus by PCR NEGATIVE NEGATIVE Final    Comment: (NOTE) Fact Sheet for Patients: bloggercourse.com  Fact Sheet for Healthcare Providers: seriousbroker.it  This test is not yet approved or cleared by the [place]  FDA and has been authorized for detection and/or diagnosis of SARS-CoV-2 by FDA under an Emergency Use Authorization (EUA). This EUA will remain  in effect (meaning this test can be used) for the duration of the COVID-19 declaration under Section 564(b)(1) of the Act, 21 U.S.C. section 360bbb-3(b)(1), unless the authorization is terminated or revoked.  Performed at [facility], [address] [place], [place] [address]   Blood Culture (routine x 2)     Status: None (Preliminary result)   Collection Time: 06/13/24 12:12 PM   Specimen: BLOOD  Result Value Ref Range Status   Specimen Description BLOOD LEFT ANTECUBITAL  Final   Special Requests   Final    BOTTLES DRAWN AEROBIC AND ANAEROBIC Blood Culture results may not be optimal due to an inadequate volume of blood received in culture bottles   Culture   Final    NO GROWTH 2 DAYS Performed at [facility], [address] [place], [place] [address]    Report Status PENDING  Incomplete  Blood Culture (routine x 2)     Status: None (Preliminary result)   Collection Time: 06/13/24 12:17  PM   Specimen: BLOOD  Result Value Ref Range Status   Specimen Description BLOOD RIGHT ANTECUBITAL  Final   Special Requests   Final    BOTTLES DRAWN AEROBIC AND ANAEROBIC Blood Culture results may not be optimal due to an inadequate volume of blood received in culture bottles   Culture   Final    NO GROWTH 2 DAYS Performed at [facility], [address] [place], [place] [address]    Report Status PENDING  Incomplete  Respiratory (~20 pathogens) panel by PCR     Status: None   Collection Time: 06/13/24  6:26 PM   Specimen: Nasopharyngeal Swab; Respiratory  Result Value Ref Range Status   Adenovirus NOT DETECTED NOT DETECTED Final   Coronavirus 229E NOT DETECTED NOT DETECTED Final    Comment: (NOTE) The Coronavirus on the Respiratory Panel, DOES NOT test for the novel  Coronavirus (2019 nCoV)    Coronavirus HKU1 NOT DETECTED NOT DETECTED Final   Coronavirus NL63 NOT DETECTED NOT DETECTED Final   Coronavirus OC43 NOT DETECTED NOT DETECTED Final   Metapneumovirus NOT DETECTED NOT DETECTED Final   Rhinovirus / Enterovirus NOT DETECTED NOT DETECTED Final   Influenza A NOT DETECTED NOT DETECTED Final   Influenza B NOT DETECTED NOT DETECTED Final   Parainfluenza Virus 1 NOT DETECTED NOT DETECTED Final   Parainfluenza Virus 2 NOT DETECTED NOT DETECTED Final   Parainfluenza Virus 3 NOT DETECTED NOT DETECTED Final   Parainfluenza Virus 4 NOT DETECTED NOT DETECTED Final   Respiratory Syncytial Virus NOT DETECTED NOT DETECTED Final   Bordetella pertussis NOT DETECTED NOT DETECTED Final   Bordetella Parapertussis NOT DETECTED NOT DETECTED Final   Chlamydophila pneumoniae NOT DETECTED NOT DETECTED Final   Mycoplasma pneumoniae NOT DETECTED NOT DETECTED Final    Comment: Performed at [facility], [address], [place], [place] [address]  C Difficile Quick Screen w PCR reflex     Status: None   Collection Time: 06/14/24 12:15 PM   Specimen: STOOL  Result Value Ref Range  Status   C Diff antigen NEGATIVE NEGATIVE Final   C Diff toxin NEGATIVE NEGATIVE Final   C Diff interpretation No C. difficile detected.  Final    Comment: Performed at [facility], [address] [place], [place] [address]    Studies/Results: CT ABDOMEN PELVIS W CONTRAST Result Date: 06/13/2024 CLINICAL DATA:  sepsis, rectal pain EXAM: CT ABDOMEN AND PELVIS WITH CONTRAST TECHNIQUE: Multidetector CT  imaging of the abdomen and pelvis was performed using the standard protocol following bolus administration of intravenous contrast. RADIATION DOSE REDUCTION: This exam was performed according to the departmental dose-optimization program which includes automated exposure control, adjustment of the mA and/or kV according to patient size and/or use of iterative reconstruction technique. CONTRAST:  80mL OMNIPAQUE  IOHEXOL  300 MG/ML  SOLN COMPARISON:  May 22, 2024, April 27, 2024 FINDINGS: Lower chest: No focal airspace consolidation or pleural effusion.Multifocal subsegmental atelectasis in the lung bases. Hepatobiliary: No mass.No radiopaque stones or wall thickening of the gallbladder. Mild dilation of the common bile duct, likely related to the prior cholecystectomy. The portal veins are patent. Pancreas: No mass or main ductal dilation. No peripancreatic inflammation or fluid collection. Spleen: Normal size. No mass. Adrenals/Urinary Tract: No adrenal masses. Subcentimeter hypodensity noted in the left kidney, too small to definitively characterize, but likely a small cyst. No nephrolithiasis or hydronephrosis. The urinary bladder is distended without wall thickening. Curvilinear hypodensity layering dependently within the urinary bladder (axial 79). Stomach/Bowel: The stomach is decompressed without focal abnormality. No small bowel wall thickening or inflammation. No small bowel obstruction.Normal appendix. Descending and sigmoid colonic diverticulosis. No changes of acute diverticulitis.  Asymmetric wall thickening and enhancement along the rectum/anal canal. Vascular/Lymphatic: No aortic aneurysm. Scattered aortoiliac atherosclerosis. No intraabdominal or pelvic lymphadenopathy. Reproductive: Hysterectomy. No concerning adnexal mass.No free pelvic fluid. Findings consistent with pelvic floor laxity. Other: No pneumoperitoneum, ascites, or mesenteric inflammation. Musculoskeletal: No acute fracture or destructive lesion. Osteopenia. Mild grade 1 anterolisthesis of L4 on L5. Multilevel degenerative disc disease of the spine. Thoracic DISH. IMPRESSION: 1. Asymmetric wall thickening and mucosal enhancement along the rectum/anal canal, which may represent changes of proctitis or neoplasm. Correlation with physical exam findings recommended. 2. Curvilinear hypodensity layering dependently within the urinary bladder (axial 79), which may represent mixing artifact from excreted contrast. Correlation with urinalysis recommended to exclude blood products or infectious debris. 3. Descending and sigmoid colonic diverticulosis. No changes of acute diverticulitis. 4. Findings consistent with pelvic floor laxity. Aortic Atherosclerosis (ICD10-I70.0). Electronically Signed   By: Rogelia Myers M.D.   On: 06/13/2024 15:10    Assessment/Plan: AVENLY ROBERGE is a 78 y.o. female with lymphoma undergoing chemotherapy admitted with NTP Fever. CT scan showed rectal thickening with possible proctitis. Clinically stable. No evidence of portacath infection. 2 small skin lesions noted but pt reports were there for several weeks.   Recommendations Cont cefepime  and flagyl  FU BCX If becomes unstable would start vancomycin   If improving can dc on levofloxacin  500 mg po every day and flagyl  500 mg po bid  for a total of 7 days of abx. She is PCN allergic.  Thank you very much for the consult. Will follow with you.  Alm SHAUNNA Needle   06/15/2024, 1:40 PM

## 2024-06-15 NOTE — Evaluation (Signed)
 Physical Therapy Evaluation Patient Details Name: Erin [name] MRN: [id number] DOB: 02/12/1946 Today's Date: 06/15/2024  History of Present Illness  Pt is a 78 y.o. female presenting to ED with rectal pain, dizziness and fevers. MD assessment includes neutropenic fever. 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   Clinical Impression  Pt was found sitting upright in chair on arrival, was pleasant and motivated to participate during the session and put forth good effort throughout. Pt reported some discomfort in her R hand and R foot with swelling, but otherwise feeling okay. Pt tolerated standing from chair using RW and CGA -- but required max cueing for trunk flexion, weight shifting and hand placement. Pt needed cueing for maintaining body inside the frame of the walker while ambulating approx 64ft. Pt was overall unsteady on feet with heavy reliance on RW. Pts SpO2 and HR remained WNL throughout and BP 95/55 at the end of the session -- nursing notified/MD present at end of session. Pt will benefit from continued PT services upon discharge to safely address deficits listed in patient problem list for decreased caregiver assistance and eventual return to PLOF.       If plan is discharge home, recommend the following: A little help with walking and/or transfers;A little help with bathing/dressing/bathroom;Assistance with cooking/housework;Assist for transportation;Help with stairs or ramp for entrance   Can travel by private vehicle   Yes    Equipment Recommendations  none  Recommendations for Other Services       Functional Status Assessment Patient has had a recent decline in their functional status and demonstrates the ability to make significant improvements in function in a reasonable and predictable amount of time.     Precautions / Restrictions Precautions Precautions:  Fall Recall of Precautions/Restrictions: Impaired Restrictions Weight Bearing Restrictions Per Provider Order: No      Mobility  Bed Mobility               General bed mobility comments: NT found in chair    Transfers Overall transfer level: Needs assistance Equipment used: Rolling walker (2 wheels) Transfers: Sit to/from Stand Sit to Stand: Contact guard assist           General transfer comment: needed a lot of cueing to come to standing from cahir using RW --was very effortful with movement and needed multiple attempts to achieve standing, a lot of forward flexion and reliance on RW    Ambulation/Gait Ambulation/Gait assistance: Contact guard assist Gait Distance (Feet): 20 Feet Assistive device: Rolling walker (2 wheels) Gait Pattern/deviations: Step-through pattern Gait velocity: decreased     General Gait Details: slow, and slightly unsteady with RW, needed constant cueing for walker placement and body positioning inside of the frame for safety, foward flexion and reliant on walker for support  Stairs            Wheelchair Mobility     Tilt Bed    Modified Rankin (Stroke Patients Only)       Balance Overall balance assessment: Needs assistance Sitting-balance support: Feet supported Sitting balance-[name] Scale: Good     Standing balance support: Reliant on assistive device for balance, Bilateral upper extremity supported Standing balance-[name] Scale: Fair Standing balance comment: RW                             Pertinent Vitals/Pain Pain Assessment Pain Assessment:  Faces Faces Pain Scale: Hurts a little bit Pain Location: R hand and R foot swelling Pain Descriptors / Indicators: Aching Pain Intervention(s): Monitored during session, Repositioned    Home Living Family/patient expects to be discharged to:: Private residence Living Arrangements: Alone Available Help at Discharge: Family;Available PRN/intermittently Type  of Home: Apartment Home Access: Elevator       Home Layout: One level Home Equipment: Shower seat - built in;Grab bars - tub/shower;Rollator (4 wheels)      Prior Function Prior Level of Function : Independent/Modified Independent             Mobility Comments: daughters and sons help PRN as well. Daughter checks in to help with groceries and other chores as needed. ADLs Comments: ind, does not drive     Extremity/Trunk Assessment   Upper Extremity Assessment Upper Extremity Assessment: Defer to OT evaluation    Lower Extremity Assessment Lower Extremity Assessment: Generalized weakness       Communication   Communication Communication: No apparent difficulties    Cognition Arousal: Alert Behavior During Therapy: WFL for tasks assessed/performed                             Following commands: Intact       Cueing       General Comments General comments (skin integrity, edema, etc.): reports lightheadedness at end of session that reports improves with rest; R hand swelling and weird feeling reported to nurse    Exercises Other Exercises Other Exercises: pt edu on the role of PT in acute care setting, recs for dc, safe use of DME   Assessment/Plan    PT Assessment Patient needs continued PT services  PT Problem List Decreased strength;Decreased range of motion;Decreased activity tolerance;Decreased balance;Decreased mobility;Decreased knowledge of use of DME;Decreased safety awareness       PT Treatment Interventions DME instruction;Gait training;Stair training;Functional mobility training;Therapeutic activities;Therapeutic exercise;Balance training;Patient/family education    PT Goals (Current goals can be found in the Care Plan section)  Acute Rehab PT Goals Patient Stated Goal: get back to feeling better PT Goal Formulation: With patient Time For Goal Achievement: 06/28/24 Potential to Achieve Goals: Good    Frequency Min 2X/week      Co-evaluation               AM-PAC PT 6 Clicks Mobility  Outcome Measure Help needed turning from your back to your side while in a flat bed without using bedrails?: A Little Help needed moving from lying on your back to sitting on the side of a flat bed without using bedrails?: A Little Help needed moving to and from a bed to a chair (including a wheelchair)?: A Little Help needed standing up from a chair using your arms (e.g., wheelchair or bedside chair)?: A Little Help needed to walk in hospital room?: A Little Help needed climbing 3-5 steps with a railing? : A Lot 6 Click Score: 17    End of Session Equipment Utilized During Treatment: Gait belt Activity Tolerance: Patient limited by fatigue Patient left: in chair;with call bell/phone within reach;with chair alarm set Nurse Communication: Mobility status PT Visit Diagnosis: Unsteadiness on feet (R26.81);Difficulty in walking, not elsewhere classified (R26.2);Pain Pain - part of body:  (R hand and R foot)    Time: 8941-8874 PT Time Calculation (min) (ACUTE ONLY): 27 min   Charges:  Corean Newport, SPT 06/15/2024, 1:20 PM

## 2024-06-15 NOTE — NC FL2 (Signed)
 " [place]  MEDICAID FL2 LEVEL OF CARE FORM     IDENTIFICATION  Patient Name: Erin [name] Birthdate: 22-Dec-1945 Sex: female Admission Date (Current Location): 06/13/2024  [facility] and [place] Number:  Chiropodist and Address:  [facility], [address], [place], [place] [address]      Provider Number: [id number]  Attending Physician Name and Address:  Leesa Kast, DO  Relative Name and Phone Number:  Landy No  Daughter, Emergency Contact  [phone]    Current Level of Care: Hospital Recommended Level of Care: Skilled Nursing Facility Prior Approval Number:    Date Approved/Denied:   PASRR Number:    Discharge Plan: SNF    Current Diagnoses: Patient Active Problem List   Diagnosis Date Noted   Neutropenic fever 06/13/2024   Lymphoma of lymph nodes of head and neck region (HCC) 05/16/2024   Bacteremia 01/20/2024   Acute colitis 01/15/2024   Chest pain 01/15/2024   GERD without esophagitis 01/15/2024   Anxiety and depression 01/15/2024   Chronic asthma 01/15/2024   Cervical radicular pain 03/10/2023   Cervical facet joint syndrome 03/10/2023   Bilateral occipital neuralgia 03/10/2023   Chronic pain syndrome 03/10/2023   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 03/17/2020   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 01/17/2020   Urge incontinence 01/17/2020   Pneumonia due to COVID-19 virus 06/27/2019   Respiratory failure with hypoxia (HCC) 06/27/2019   Chronic anticoagulation 06/27/2019   CKD (chronic kidney disease) stage 3, GFR 30-59 ml/min (HCC) 06/27/2019   Sepsis (HCC) 10/01/2018   Asthma exacerbation 10/05/2017   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   Disease of liver 12/18/2014   Acute URI 11/07/2014   BP (high blood pressure) 05/09/2014   Chronic hepatitis B virus infection (HCC) 03/27/2011   Lymphoma (HCC) 10/27/2007     Orientation RESPIRATION BLADDER Height & Weight     Self, Time, Situation    Incontinent Weight: 73.2 kg Height:  5' 3 (160 cm)  BEHAVIORAL SYMPTOMS/MOOD NEUROLOGICAL BOWEL NUTRITION STATUS      Incontinent Diet  AMBULATORY STATUS COMMUNICATION OF NEEDS Skin   Extensive Assist Verbally                         Personal Care Assistance Level of Assistance  Bathing, Feeding, Dressing Bathing Assistance: Maximum assistance Feeding assistance: Limited assistance Dressing Assistance: Maximum assistance     Functional Limitations Info             SPECIAL CARE FACTORS FREQUENCY  PT (By licensed PT), OT (By licensed OT)     PT Frequency: 5 x week OT Frequency: 5 x week            Contractures      Additional Factors Info  Code Status, Allergies Code Status Info: FULL Allergies Info: Penicillins, Prednisone , Sulfa Antibiotics, Enalapril, Medrol  (Methylprednisolone )           Current Medications (06/15/2024):  This is the current hospital active medication list Current Facility-Administered Medications  Medication Dose Route Frequency Provider Last Rate Last Admin   acetaminophen  (TYLENOL ) tablet 650 mg  650 mg Oral Q6H PRN Khan, Ghalib, MD   650 mg at 06/13/24 2337   Or   acetaminophen  (TYLENOL ) suppository 650 mg  650 mg Rectal Q6H PRN Khan, Ghalib, MD       acyclovir  (ZOVIRAX ) 200 MG capsule 400 mg  400 mg Oral Daily Fernand,  Ghalib, MD   400 mg at 06/15/24 1024   apixaban  (ELIQUIS ) tablet 5 mg  5 mg Oral BID Khan, Ghalib, MD   5 mg at 06/15/24 1024   ceFEPIme  (MAXIPIME ) 2 g in sodium chloride  0.9 % 100 mL IVPB  2 g Intravenous Q12H Hunt, Madison H, RPH 200 mL/hr at 06/15/24 0930 2 g at 06/15/24 0930   fluticasone  furoate-vilanterol (BREO ELLIPTA ) 200-25 MCG/ACT 1 puff  1 puff Inhalation Daily Khan, Ghalib, MD   1 puff at 06/15/24 0900   gabapentin  (NEURONTIN ) capsule 600 mg  600 mg Oral TID Khan, Ghalib, MD   600 mg at 06/15/24 1024   hydrocortisone   (ANUSOL -HC) suppository 25 mg  25 mg Rectal BID Khan, Ghalib, MD   25 mg at 06/15/24 1029   hydrOXYzine  (ATARAX ) tablet 10 mg  10 mg Oral QHS Khan, Ghalib, MD   10 mg at 06/14/24 2248   ipratropium-albuterol  (DUONEB) 0.5-2.5 (3) MG/3ML nebulizer solution 3 mL  3 mL Nebulization Q6H PRN Fernand Prost, MD   3 mL at 06/13/24 2120   metroNIDAZOLE  (FLAGYL ) IVPB 500 mg  500 mg Intravenous Q12H Khan, Ghalib, MD 100 mL/hr at 06/15/24 1216 500 mg at 06/15/24 1216   ondansetron  (ZOFRAN ) tablet 4 mg  4 mg Oral Q6H PRN Fernand Prost, MD       Or   ondansetron  (ZOFRAN ) injection 4 mg  4 mg Intravenous Q6H PRN Fernand Prost, MD       pantoprazole  (PROTONIX ) EC tablet 40 mg  40 mg Oral Daily Khan, Ghalib, MD   40 mg at 06/15/24 1023   senna-docusate (Senokot-S) tablet 1 tablet  1 tablet Oral QHS PRN Fernand Prost, MD       sodium chloride  flush (NS) 0.9 % injection 3 mL  3 mL Intravenous Q12H Khan, Ghalib, MD   3 mL at 06/15/24 1028   traZODone  (DESYREL ) tablet 50 mg  50 mg Oral QHS Khan, Ghalib, MD   50 mg at 06/14/24 2249   Facility-Administered Medications Ordered in Other Encounters  Medication Dose Route Frequency Provider Last Rate Last Admin   sodium chloride  0.9 % injection 10 mL  10 mL Intravenous PRN Wilder Pink, MD   10 mL at 12/18/14 1121     Discharge Medications: Please see discharge summary for a list of discharge medications.  Relevant Imaging Results:  Relevant Lab Results:   Additional Information SSN:[id number]      Taken  Dalia GORMAN Fuse, RN     "

## 2024-06-15 NOTE — Progress Notes (Signed)
 " PROGRESS NOTE    Erin [name]  FMW:[id number] DOB: 1945/07/19 DOA: 06/13/2024 PCP: [facility]  Chief Complaint  Patient presents with   Dizziness   Rectal Pain    Hospital Course:  Erin [name] is a 78 year old female with hypertension, recent diagnosis of recurrent lymphoma R-CHOP, asthma, CKD stage IIIb who presented to the ED with rectal pain, dizziness, fever.  Patient endorses progressive weakness over the days prior to arrival to the degree that she now cannot ambulate.  Labs in the ED reveal severe leukopenia at 0.7 with absolute neutrophil count at 200.  Thrombocytopenia platelets 57.  CMP with multiple electrolyte abnormalities though stable renal function.  RVP negative.  Lactic acid WNL.  CXR negative.  CT abdomen pelvis with asymmetric wall thickening and mucosal enhancement along the rectum concerning for proctitis versus neoplasm.  Patient was initially hypotensive but fluid responsive.  She was admitted for neutropenic fever and started on broad-spectrum antibiotics  Subjective: No acute events overnight.  No fever. Patient reports that she is having some right arm swelling and right lower leg swelling today.  She endorses generalized fatigue.  Objective: Vitals:   06/15/24 0044 06/15/24 0416 06/15/24 0500 06/15/24 0747  BP: (!) 92/47 109/87  (!) 91/57  Pulse: 85 83  84  Resp: 18 18  18   Temp: 98.5 F (36.9 C) 99.1 F (37.3 C)  99.1 F (37.3 C)  TempSrc:    Oral  SpO2: 93% 94%  96%  Weight:   73.2 kg   Height:        Intake/Output Summary (Last 24 hours) at 06/15/2024 0750 Last data filed at 06/15/2024 0032 Gross per 24 hour  Intake 355.89 ml  Output --  Net 355.89 ml   Filed Weights   06/13/24 1147 06/15/24 0500  Weight: 72.1 kg 73.2 kg    Examination: General exam: Appears calm and comfortable, NAD, appears weak and fatigued Respiratory system: No work of breathing, symmetric chest wall expansion Cardiovascular  system: S1 & S2 heard, RRR.  Gastrointestinal system: Abdomen is nondistended, soft and nontender.  Neuro: Alert and oriented. Extremities: Right leg swelling, no erythema, mildly tender to palpation Psychiatry: Demonstrates appropriate judgement and insight. Mood & affect appropriate for situation.   Assessment & Plan:  Principal Problem:   Neutropenic fever Active Problems:   GERD without esophagitis   Anxiety and depression   Lymphoma (HCC)   CKD (chronic kidney disease) stage 3, GFR 30-59 ml/min (HCC)   Chronic asthma    Neutropenic fever - Presumed source is proctitis.  Also having persistent diarrhea.  - CT abdomen pelvis consistent with proctitis - C. difficile negative - CXR, UA, and RVP WNL - No evidence of port infection - Continue cefepime  and Flagyl  for now.  If she becomes unstable we will add vancomycin  - ID consulted and agrees with the above.  No indication for vancomycin  at this time though she did receive 1 dose in the ED - Continue to follow CBC and CMP closely - Protective precautions - Blood cultures remain negative, follow to completion  Proctitis Diarrhea - May be secondary to chemotherapy - C. difficile negative - Will order for Imodium to ease symptom burden - Treatment for neutropenic fever with antibiotics as above  Right lower extremity swelling - Right leg greater than left.  Doppler ordered.  Has been on Eliquis .  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  Anusol  cream available  Recurrent lymphoma - Initially diagnosed with lymphoma in 2009, was in remission with recurrence in 2015 and then again in 2005. - She was recently started on R-CHOP with plan for chemotherapy every 21 days x 3 cycles.  First dose 12/8. 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 - Outpatient follow up for lifestyle modification and risk factor management   DVT prophylaxis: Eliquis    Code Status: Full Code Disposition:  Inpatient pending clinical resolution  Consultants:  Treatment Team:  Consulting Physician: Babara Call, MD  Procedures:  N/a  Antimicrobials:  Anti-infectives (From admission, onward)    Start     Dose/Rate Route Frequency Ordered Stop   06/13/24 2359  metroNIDAZOLE  (FLAGYL ) IVPB 500 mg        500 mg 100 mL/hr over 60 Minutes Intravenous Every 12 hours 06/13/24 1818     06/13/24 2030  acyclovir  (ZOVIRAX ) 200 MG capsule 400 mg        400 mg Oral Daily 06/13/24 2001     06/13/24 2000  ceFEPIme  (MAXIPIME ) 2 g in sodium chloride  0.9 % 100 mL IVPB        2 g 200 mL/hr over 30 Minutes Intravenous Every 12 hours 06/13/24 1820     06/13/24 1200  aztreonam  (AZACTAM ) 2 g in sodium chloride  0.9 % 100 mL IVPB        2 g 200 mL/hr over 30 Minutes Intravenous  Once 06/13/24 1159 06/13/24 1249   06/13/24 1200  metroNIDAZOLE  (FLAGYL ) IVPB 500 mg        500 mg 100 mL/hr over 60 Minutes Intravenous  Once 06/13/24 1159 06/13/24 1326   06/13/24 1200  vancomycin  (VANCOCIN ) IVPB 1000 mg/200 mL premix        1,000 mg 200 mL/hr over 60 Minutes Intravenous  Once 06/13/24 1159 06/13/24 1419       Data Reviewed: I have personally reviewed following labs and imaging studies CBC: Recent Labs  Lab 06/13/24 1155 06/14/24 0724  WBC 0.7* 1.6*  NEUTROABS 0.2*  --   HGB 10.9* 9.0*  HCT 32.9* 27.6*  MCV 92.7 96.8  PLT 57* 52*   Basic Metabolic Panel: Recent Labs  Lab 06/13/24 1155  06/14/24 0724  NA 133* 139  K 3.4* 3.5  CL 97* 108  CO2 28 24  GLUCOSE 101* 78  BUN 20 19  CREATININE 1.35* 1.15*  CALCIUM  8.5* 7.5*  MG 2.8*  --    GFR: Estimated Creatinine Clearance: 39.3 mL/min (A) (by C-G formula based on SCr of 1.15 mg/dL (H)). Liver Function Tests: Recent Labs  Lab 06/13/24 1155  AST 16  ALT 18  ALKPHOS 70  BILITOT 0.5  PROT 6.0*  ALBUMIN 3.6   CBG: Recent Labs  Lab 06/14/24 0802  GLUCAP 75    Recent Results (from the past 240 hours)  Resp panel by RT-PCR (RSV, Flu A&B, Covid) Anterior Nasal Swab     Status: None   Collection Time: 06/13/24 11:56 AM   Specimen: Anterior Nasal  Swab  Result Value Ref Range Status   SARS Coronavirus 2 by RT PCR NEGATIVE NEGATIVE Final    Comment: (NOTE) SARS-CoV-2 target nucleic acids are NOT DETECTED.  The SARS-CoV-2 RNA is generally detectable in upper respiratory specimens during the acute phase of infection. The lowest concentration of SARS-CoV-2 viral copies this assay can detect is 138 copies/mL. A negative result does not preclude SARS-Cov-2 infection and should not be used as the sole basis for treatment or other patient management decisions. A negative result may occur with  improper specimen collection/handling, submission of specimen other than nasopharyngeal swab, presence of viral mutation(s) within the areas targeted by this assay, and inadequate number of viral copies(<138 copies/mL). A negative result must be combined with clinical observations, patient history, and epidemiological information. The expected result is Negative.  Fact Sheet for Patients:  bloggercourse.com  Fact Sheet for Healthcare Providers:  seriousbroker.it  This test is no t yet approved or cleared by the [place]  FDA and  has been authorized for detection and/or diagnosis of SARS-CoV-2 by FDA under an Emergency Use Authorization (EUA). This EUA will remain  in  effect (meaning this test can be used) for the duration of the COVID-19 declaration under Section 564(b)(1) of the Act, 21 U.S.C.section 360bbb-3(b)(1), unless the authorization is terminated  or revoked sooner.       Influenza A by PCR NEGATIVE NEGATIVE Final   Influenza B by PCR NEGATIVE NEGATIVE Final    Comment: (NOTE) The Xpert Xpress SARS-CoV-2/FLU/RSV plus assay is intended as an aid in the diagnosis of influenza from Nasopharyngeal swab specimens and should not be used as a sole basis for treatment. Nasal washings and aspirates are unacceptable for Xpert Xpress SARS-CoV-2/FLU/RSV testing.  Fact Sheet for Patients: bloggercourse.com  Fact Sheet for Healthcare Providers: seriousbroker.it  This test is not yet approved or cleared by the [place]  FDA and has been authorized for detection and/or diagnosis of SARS-CoV-2 by FDA under an Emergency Use Authorization (EUA). This EUA will remain in effect (meaning this test can be used) for the duration of the COVID-19 declaration under Section 564(b)(1) of the Act, 21 U.S.C. section 360bbb-3(b)(1), unless the authorization is terminated or revoked.     Resp Syncytial Virus by PCR NEGATIVE NEGATIVE Final    Comment: (NOTE) Fact Sheet for Patients: bloggercourse.com  Fact Sheet for Healthcare Providers: seriousbroker.it  This test is not yet approved or cleared by the [place]  FDA and has been authorized for detection and/or diagnosis of SARS-CoV-2 by FDA under an Emergency Use Authorization (EUA). This EUA will remain in effect (meaning this test can be used) for the duration of the COVID-19 declaration under Section 564(b)(1) of the Act, 21 U.S.C. section 360bbb-3(b)(1), unless the authorization is terminated or revoked.  Performed at [facility], [address] [place], [place] [address]   Blood  Culture (routine x 2)     Status: None (Preliminary result)   Collection Time: 06/13/24 12:12 PM   Specimen: BLOOD  Result Value Ref Range Status   Specimen Description BLOOD LEFT ANTECUBITAL  Final   Special Requests   Final    BOTTLES DRAWN AEROBIC AND ANAEROBIC Blood Culture results may not be optimal due to an inadequate volume of blood received in culture bottles   Culture   Final    NO GROWTH 2 DAYS Performed at [facility], [address], [place], [place] [address]    Report Status PENDING  Incomplete  Blood Culture (routine x  2)     Status: None (Preliminary result)   Collection Time: 06/13/24 12:17 PM   Specimen: BLOOD  Result Value Ref Range Status   Specimen Description BLOOD RIGHT ANTECUBITAL  Final   Special Requests   Final    BOTTLES DRAWN AEROBIC AND ANAEROBIC Blood Culture results may not be optimal due to an inadequate volume of blood received in culture bottles   Culture   Final    NO GROWTH 2 DAYS Performed at [facility], [address] [place], [place] [address]    Report Status PENDING  Incomplete  Respiratory (~20 pathogens) panel by PCR     Status: None   Collection Time: 06/13/24  6:26 PM   Specimen: Nasopharyngeal Swab; Respiratory  Result Value Ref Range Status   Adenovirus NOT DETECTED NOT DETECTED Final   Coronavirus 229E NOT DETECTED NOT DETECTED Final    Comment: (NOTE) The Coronavirus on the Respiratory Panel, DOES NOT test for the novel  Coronavirus (2019 nCoV)    Coronavirus HKU1 NOT DETECTED NOT DETECTED Final   Coronavirus NL63 NOT DETECTED NOT DETECTED Final   Coronavirus OC43 NOT DETECTED NOT DETECTED Final   Metapneumovirus NOT DETECTED NOT DETECTED Final   Rhinovirus / Enterovirus NOT DETECTED NOT DETECTED Final   Influenza A NOT DETECTED NOT DETECTED Final   Influenza B NOT DETECTED NOT DETECTED Final   Parainfluenza Virus 1 NOT DETECTED NOT DETECTED Final   Parainfluenza Virus 2 NOT DETECTED NOT DETECTED  Final   Parainfluenza Virus 3 NOT DETECTED NOT DETECTED Final   Parainfluenza Virus 4 NOT DETECTED NOT DETECTED Final   Respiratory Syncytial Virus NOT DETECTED NOT DETECTED Final   Bordetella pertussis NOT DETECTED NOT DETECTED Final   Bordetella Parapertussis NOT DETECTED NOT DETECTED Final   Chlamydophila pneumoniae NOT DETECTED NOT DETECTED Final   Mycoplasma pneumoniae NOT DETECTED NOT DETECTED Final    Comment: Performed at [facility], [address] [place], [place] [address]  C Difficile Quick Screen w PCR reflex     Status: None   Collection Time: 06/14/24 12:15 PM   Specimen: STOOL  Result Value Ref Range Status   C Diff antigen NEGATIVE NEGATIVE Final   C Diff toxin NEGATIVE NEGATIVE Final   C Diff interpretation No C. difficile detected.  Final    Comment: Performed at [facility], [address] [place], [place] [address]     Radiology Studies: CT ABDOMEN PELVIS W CONTRAST Result Date: 06/13/2024 CLINICAL DATA:  sepsis, rectal pain EXAM: CT ABDOMEN AND PELVIS WITH CONTRAST TECHNIQUE: Multidetector CT imaging of the abdomen and pelvis was performed using the standard protocol following bolus administration of intravenous contrast. RADIATION DOSE REDUCTION: This exam was performed according to the departmental dose-optimization program which includes automated exposure control, adjustment of the mA and/or kV according to patient size and/or use of iterative reconstruction technique. CONTRAST:  80mL OMNIPAQUE  IOHEXOL  300 MG/ML  SOLN COMPARISON:  May 22, 2024, April 27, 2024 FINDINGS: Lower chest: No focal airspace consolidation or pleural effusion.Multifocal subsegmental atelectasis in the lung bases. Hepatobiliary: No mass.No radiopaque stones or wall thickening of the gallbladder. Mild dilation of the common bile duct, likely related to the prior cholecystectomy. The portal veins are patent. Pancreas: No mass or main ductal dilation. No peripancreatic  inflammation or fluid collection. Spleen: Normal size. No mass. Adrenals/Urinary Tract: No adrenal masses. Subcentimeter hypodensity noted in the left kidney, too small to definitively characterize, but likely a small cyst. No  nephrolithiasis or hydronephrosis. The urinary bladder is distended without wall thickening. Curvilinear hypodensity layering dependently within the urinary bladder (axial 79). Stomach/Bowel: The stomach is decompressed without focal abnormality. No small bowel wall thickening or inflammation. No small bowel obstruction.Normal appendix. Descending and sigmoid colonic diverticulosis. No changes of acute diverticulitis. Asymmetric wall thickening and enhancement along the rectum/anal canal. Vascular/Lymphatic: No aortic aneurysm. Scattered aortoiliac atherosclerosis. No intraabdominal or pelvic lymphadenopathy. Reproductive: Hysterectomy. No concerning adnexal mass.No free pelvic fluid. Findings consistent with pelvic floor laxity. Other: No pneumoperitoneum, ascites, or mesenteric inflammation. Musculoskeletal: No acute fracture or destructive lesion. Osteopenia. Mild grade 1 anterolisthesis of L4 on L5. Multilevel degenerative disc disease of the spine. Thoracic DISH. IMPRESSION: 1. Asymmetric wall thickening and mucosal enhancement along the rectum/anal canal, which may represent changes of proctitis or neoplasm. Correlation with physical exam findings recommended. 2. Curvilinear hypodensity layering dependently within the urinary bladder (axial 79), which may represent mixing artifact from excreted contrast. Correlation with urinalysis recommended to exclude blood products or infectious debris. 3. Descending and sigmoid colonic diverticulosis. No changes of acute diverticulitis. 4. Findings consistent with pelvic floor laxity. Aortic Atherosclerosis (ICD10-I70.0). Electronically Signed   By: Rogelia Myers M.D.   On: 06/13/2024 15:10   DG Chest Port 1 View Result Date:  06/13/2024 CLINICAL DATA:  Questionable sepsis - evaluate for abnormality EXAM: PORTABLE CHEST - 1 VIEW COMPARISON:  January 14, 2024, May 22, 2024 FINDINGS: Subsegmental atelectasis in the left lung base. No focal airspace consolidation, pleural effusion, or pneumothorax. No cardiomegaly. Right chest port is similarly positioned terminating at the cavoatrial junction. Tortuous aorta with aortic atherosclerosis. No acute fracture or destructive lesions. Multilevel thoracic osteophytosis. Remote, healed rib fractures. Osteoarthritis of both glenohumeral joints. IMPRESSION: No acute cardiopulmonary abnormality. Electronically Signed   By: Rogelia Myers M.D.   On: 06/13/2024 14:00    Scheduled Meds:  acyclovir   400 mg Oral Daily   apixaban   5 mg Oral BID   fluticasone  furoate-vilanterol  1 puff Inhalation Daily   gabapentin   600 mg Oral TID   hydrocortisone   25 mg Rectal BID   hydrOXYzine   10 mg Oral QHS   pantoprazole   40 mg Oral Daily   sodium chloride  flush  3 mL Intravenous Q12H   traZODone   50 mg Oral QHS   Continuous Infusions:  ceFEPime  (MAXIPIME ) IV 200 mL/hr at 06/15/24 0032   metronidazole  100 mL/hr at 06/15/24 0032     LOS: 2 days  MDM: Patient is high risk for one or more organ failure.  They necessitate ongoing hospitalization for continued IV therapies and subsequent lab monitoring. Total time spent interpreting labs and vitals, reviewing the medical record, coordinating care amongst consultants and care team members, directly assessing and discussing care with the patient and/or family: 55 min Stefania Goulart, DO [facility]  To contact the attending physician between 7A-7P please use Epic Chat. To contact the covering physician during after hours 7P-7A, please review Amion.  06/15/2024, 7:50 AM   *This document has been created with the assistance of dictation software. Please excuse typographical errors. *   "

## 2024-06-15 NOTE — Care Management Important Message (Signed)
 Important Message  Patient Details  Name: Erin [name] MRN: [id number] Date of Birth: [birth year]   Important Message Given:  Yes - Medicare IM     Aryka Coonradt W, CMA 06/15/2024, 1:13 PM

## 2024-06-15 NOTE — Progress Notes (Signed)
 Erin [name]   DOB:June 17, 1946   FM#:[id number]    Subjective: Neutropenic fever/colitis.  Patient tolerating solid diet well.  Denies any worsening abdominal pain.  Denies any diarrhea.  Denies blood in stools.  No nausea no vomiting.  Objective:  Vitals:   06/17/24 0422 06/17/24 0750  BP: (!) 83/47 (!) 107/57  Pulse: 85 89  Resp: 18 18  Temp: 97.9 F (36.6 C) 99.3 F (37.4 C)  SpO2: 96% 96%    No intake or output data in the 24 hours ending 06/18/24 1425  Improvement noted of the right submandibular lymphoma/lymph node mass.  Physical Exam Vitals and nursing note reviewed.  HENT:     Head: Normocephalic and atraumatic.     Mouth/Throat:     Pharynx: Oropharynx is clear.  Eyes:     Extraocular Movements: Extraocular movements intact.     Pupils: Pupils are equal, round, and reactive to light.  Cardiovascular:     Rate and Rhythm: Normal rate and regular rhythm.  Pulmonary:     Comments: Decreased breath sounds bilaterally.  Abdominal:     Palpations: Abdomen is soft.  Musculoskeletal:        General: Normal range of motion.     Cervical back: Normal range of motion.  Skin:    General: Skin is warm.  Neurological:     General: No focal deficit present.     Mental Status: She is alert and oriented to person, place, and time.  Psychiatric:        Behavior: Behavior normal.        Judgment: Judgment normal.      Labs:  Lab Results  Component Value Date   WBC 10.1 06/16/2024   HGB 9.6 (L) 06/16/2024   HCT 29.9 (L) 06/16/2024   MCV 95.2 06/16/2024   PLT 103 (L) 06/16/2024   NEUTROABS 3.1 06/15/2024    Lab Results  Component Value Date   NA 142 06/16/2024   K 3.8 06/16/2024   CL 110 06/16/2024   CO2 25 06/16/2024    Studies:  No results found.   No problem-specific Assessment & Plan notes found for this encounter.  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   # Neutropenic fever/anemia thrombocytopenia-status post chemotherapy  R- CEOP cycle #1 on December 9th-patient status post G-CSF.  Currently on IV antibiotics-blood counts improving.  No neutropenic anymore-source of fever likely colitis.   Nov 2025 PET scan negative for concern for malignancy in the area of recent colitis. Patient had episode of prior colitis in July even prior to chemotherapy.  Patient had a remote colonoscopy.  Would recommend further evaluation with GI once acute issues resolved.   # Diffuse B-cell lymphoma-status post cycle number one of R- CEOP chemotherapy-clinical improvement noted of the right neck diffuse B-cell lymphoma.-consider dose reductions in future chemotherapy.  # The above plan of care was discussed with the patient and her daughter-over the phone.  Also discussed with attending physician.   Cindy JONELLE Joe, MD 06/18/2024  2:25 PM

## 2024-06-16 DIAGNOSIS — K219 Gastro-esophageal reflux disease without esophagitis: Secondary | ICD-10-CM | POA: Diagnosis not present

## 2024-06-16 DIAGNOSIS — F419 Anxiety disorder, unspecified: Secondary | ICD-10-CM | POA: Diagnosis not present

## 2024-06-16 DIAGNOSIS — N1832 Chronic kidney disease, stage 3b: Secondary | ICD-10-CM | POA: Diagnosis not present

## 2024-06-16 DIAGNOSIS — D709 Neutropenia, unspecified: Secondary | ICD-10-CM | POA: Diagnosis not present

## 2024-06-16 LAB — CBC
HCT: 29.9 % — ABNORMAL LOW (ref 36.0–46.0)
Hemoglobin: 9.6 g/dL — ABNORMAL LOW (ref 12.0–15.0)
MCH: 30.6 pg (ref 26.0–34.0)
MCHC: 32.1 g/dL (ref 30.0–36.0)
MCV: 95.2 fL (ref 80.0–100.0)
Platelets: 103 K/uL — ABNORMAL LOW (ref 150–400)
RBC: 3.14 MIL/uL — ABNORMAL LOW (ref 3.87–5.11)
RDW: 14.3 % (ref 11.5–15.5)
WBC: 10.1 K/uL (ref 4.0–10.5)
nRBC: 0.5 % — ABNORMAL HIGH (ref 0.0–0.2)

## 2024-06-16 LAB — COMPREHENSIVE METABOLIC PANEL WITH GFR
ALT: 11 U/L (ref 0–44)
AST: 11 U/L — ABNORMAL LOW (ref 15–41)
Albumin: 2.8 g/dL — ABNORMAL LOW (ref 3.5–5.0)
Alkaline Phosphatase: 77 U/L (ref 38–126)
Anion gap: 7 (ref 5–15)
BUN: 15 mg/dL (ref 8–23)
CO2: 25 mmol/L (ref 22–32)
Calcium: 8.7 mg/dL — ABNORMAL LOW (ref 8.9–10.3)
Chloride: 110 mmol/L (ref 98–111)
Creatinine, Ser: 1.16 mg/dL — ABNORMAL HIGH (ref 0.44–1.00)
GFR, Estimated: 48 mL/min — ABNORMAL LOW
Glucose, Bld: 95 mg/dL (ref 70–99)
Potassium: 3.8 mmol/L (ref 3.5–5.1)
Sodium: 142 mmol/L (ref 135–145)
Total Bilirubin: <0.2 mg/dL (ref 0.0–1.2)
Total Protein: 5 g/dL — ABNORMAL LOW (ref 6.5–8.1)

## 2024-06-16 LAB — PHOSPHORUS: Phosphorus: 2.4 mg/dL — ABNORMAL LOW (ref 2.5–4.6)

## 2024-06-16 LAB — MAGNESIUM: Magnesium: 2.3 mg/dL (ref 1.7–2.4)

## 2024-06-16 MED ORDER — METRONIDAZOLE 500 MG PO TABS
500.0000 mg | ORAL_TABLET | Freq: Two times a day (BID) | ORAL | Status: DC
  Administered 2024-06-16 – 2024-06-17 (×3): 500 mg via ORAL
  Filled 2024-06-16 (×3): qty 1

## 2024-06-16 MED ORDER — LEVOFLOXACIN 500 MG PO TABS
500.0000 mg | ORAL_TABLET | Freq: Every day | ORAL | Status: DC
  Administered 2024-06-16 – 2024-06-17 (×2): 500 mg via ORAL
  Filled 2024-06-16 (×2): qty 1

## 2024-06-16 MED ORDER — POLYVINYL ALCOHOL 1.4 % OP SOLN
1.0000 [drp] | OPHTHALMIC | Status: DC | PRN
  Filled 2024-06-16: qty 15

## 2024-06-16 NOTE — Plan of Care (Signed)
  Problem: Clinical Measurements: Goal: Ability to maintain clinical measurements within normal limits will improve Outcome: Progressing   Problem: Coping: Goal: Level of anxiety will decrease Outcome: Progressing   Problem: Pain Managment: Goal: General experience of comfort will improve and/or be controlled Outcome: Progressing   Problem: Safety: Goal: Ability to remain free from injury will improve Outcome: Progressing   Problem: Skin Integrity: Goal: Risk for impaired skin integrity will decrease Outcome: Progressing

## 2024-06-16 NOTE — Progress Notes (Signed)
 Mobility Specialist Progress Note:    06/16/24 0934  Mobility  Activity Ambulated with assistance  Level of Assistance Contact guard assist, steadying assist  Assistive Device Front wheel walker  Distance Ambulated (ft) 20 ft  Range of Motion/Exercises Active;All extremities  Activity Response Tolerated well  Mobility visit 1 Mobility  Mobility Specialist Start Time (ACUTE ONLY) 0913  Mobility Specialist Stop Time (ACUTE ONLY) 0932  Mobility Specialist Time Calculation (min) (ACUTE ONLY) 19 min   Pt received in bed, agreeable to OOB mobility. Required CGA to stand and ambulate with RW. Tolerated well, asx throughout. Returned pt in fowlers, alarm on and belongings in reach. All needs met.  Sherrilee Ditty Mobility Specialist Please contact via Special Educational Needs Teacher or  Rehab office at [phone]

## 2024-06-16 NOTE — Progress Notes (Signed)
 " PROGRESS NOTE    Erin [name]  FMW:[id number] DOB: [birth year] DOA: 06/13/2024 PCP: [facility]  Chief Complaint  Patient presents with   Dizziness   Rectal Pain    Hospital Course:  Erin [name] is a 78 year old female with hypertension, recent diagnosis of recurrent lymphoma R-CHOP, asthma, CKD stage IIIb who presented to the ED with rectal pain, dizziness, fever.  Patient endorses progressive weakness over the days prior to arrival to the degree that she now cannot ambulate.  Labs in the ED reveal severe leukopenia at 0.7 with absolute neutrophil count at 200.  Thrombocytopenia platelets 57.  CMP with multiple electrolyte abnormalities though stable renal function.  RVP negative.  Lactic acid WNL.  CXR negative.  CT abdomen pelvis with asymmetric wall thickening and mucosal enhancement along the rectum concerning for proctitis versus neoplasm.  Patient was initially hypotensive but fluid responsive.  She was admitted for neutropenic fever and started on broad-spectrum antibiotics. Patient has done well on antibiotics.  She does remain weaker than baseline and thus SNF was recommended.  TOC working on these recommendations  Subjective: No acute events overnight.  No acute complaints this morning.  Objective: Vitals:   06/16/24 0053 06/16/24 0443 06/16/24 0500 06/16/24 0935  BP: (!) 103/55 (!) 103/46  98/61  Pulse: 100 81  90  Resp: 17 16  16   Temp: 98.4 F (36.9 C) 98.1 F (36.7 C)  98.5 F (36.9 C)  TempSrc:  Oral  Oral  SpO2: 96% 93%  96%  Weight:   72.8 kg   Height:        Intake/Output Summary (Last 24 hours) at 06/16/2024 1230 Last data filed at 06/15/2024 2225 Gross per 24 hour  Intake 634.84 ml  Output --  Net 634.84 ml   Filed Weights   06/13/24 1147 06/15/24 0500 06/16/24 0500  Weight: 72.1 kg 73.2 kg 72.8 kg    Examination: General exam: Appears calm and comfortable, NAD Respiratory system: No work of breathing, symmetric  chest wall expansion Cardiovascular system: S1 & S2 heard, RRR.  Gastrointestinal system: Abdomen is nondistended, soft and nontender.  Neuro: Alert and oriented. Extremities: Symmetric, no swelling or erythema Psychiatry: Demonstrates appropriate judgement and insight. Mood & affect appropriate for situation.   Assessment & Plan:  Principal Problem:   Neutropenic fever Active Problems:   GERD without esophagitis   Anxiety and depression   Lymphoma (HCC)   CKD (chronic kidney disease) stage 3, GFR 30-59 ml/min (HCC)   Chronic asthma    Neutropenic fever - Presumed source is proctitis.  Also having persistent diarrhea.  - CT abdomen pelvis consistent with proctitis - C. difficile negative - CXR, UA, and RVP WNL - Blood culture negative - No evidence of port infection - Has been on cefepime  and Flagyl , will will transition to levofloxacin  500 mg and Flagyl  p.o. twice daily for total 7-day therapy.  She is penicillin allergic. - If Patient becomes unstable consider adding vancomycin .  1 dose in ED - ID consulted and plan as above. - CBC has improved.  Fever curve improved. - Continue productive precautions  Proctitis Diarrhea - May be secondary to chemotherapy - C. difficile negative - Continue with Imodium for symptom burden - GI referral at DC for colonoscopy, this is patient's second round of proctitis - Treatment for neutropenic fever with antibiotics as above  Right lower extremity swelling - Right leg greater than left.  Doppler negative for acute DVT.  Swelling is  resolved today. - Recommended leg elevation  -Hemorrhoid - Very tender to palpation.  Anusol  cream available  Recurrent lymphoma - Initially diagnosed with lymphoma in 2009, was in remission with recurrence in 2015 and then again in 2005. - She was recently started on R-CHOP with plan for chemotherapy every 21 days x 3 cycles.  First dose 12/8. - Oncology has been consulted, considering dose reduction  of future chemotherapy cycles. 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 - Outpatient follow up for lifestyle modification and risk factor management   DVT prophylaxis: Eliquis    Code Status: Full Code Disposition: Medically ready for discharge.  Pending SNF placement  Consultants:  Treatment Team:  Consulting Physician: Babara Call, MD  Procedures:  N/a  Antimicrobials:  Anti-infectives (From admission, onward)    Start     Dose/Rate Route Frequency Ordered Stop   06/13/24 2359  metroNIDAZOLE  (FLAGYL ) IVPB 500 mg        500 mg 100 mL/hr over 60 Minutes Intravenous Every 12 hours 06/13/24 1818     06/13/24 2030  acyclovir  (ZOVIRAX ) 200 MG capsule 400 mg        400 mg Oral Daily 06/13/24 2001     06/13/24 2000  ceFEPIme  (MAXIPIME ) 2 g in sodium chloride  0.9 % 100 mL IVPB        2 g 200 mL/hr over 30 Minutes Intravenous Every 12 hours 06/13/24 1820     06/13/24 1200  aztreonam  (AZACTAM ) 2 g in sodium chloride  0.9 % 100 mL IVPB        2 g 200 mL/hr over 30 Minutes Intravenous  Once 06/13/24 1159 06/13/24 1249   06/13/24 1200  metroNIDAZOLE  (FLAGYL ) IVPB 500 mg        500 mg 100 mL/hr over 60 Minutes Intravenous  Once 06/13/24 1159 06/13/24 1326   06/13/24 1200  vancomycin  (VANCOCIN ) IVPB 1000 mg/200 mL premix        1,000 mg 200 mL/hr over 60 Minutes Intravenous  Once 06/13/24 1159 06/13/24 1419       Data Reviewed: I have personally reviewed following labs and imaging studies CBC: Recent Labs  Lab 06/13/24 1155 06/14/24 0724  06/15/24 0809 06/16/24 0523  WBC 0.7* 1.6* 4.8 10.1  NEUTROABS 0.2*  --  3.1  --   HGB 10.9* 9.0* 9.5* 9.6*  HCT 32.9* 27.6* 29.1* 29.9*  MCV 92.7 96.8 93.3 95.2  PLT 57* 52* 72* 103*   Basic Metabolic Panel: Recent Labs  Lab 06/13/24 1155 06/14/24 0724 06/15/24 0809 06/16/24 0523  NA 133* 139 138 142  K 3.4* 3.5 3.5 3.8  CL 97* 108 110 110  CO2 28 24 20* 25  GLUCOSE 101* 78 87 95  BUN 20 19 15 15   CREATININE 1.35* 1.15* 1.08* 1.16*  CALCIUM  8.5* 7.5* 7.9* 8.7*  MG 2.8*  --   --  2.3  PHOS  --   --   --  2.4*   GFR: Estimated Creatinine Clearance: 38.9 mL/min (A) (by C-G formula based on SCr of 1.16 mg/dL (H)). Liver Function Tests:  Recent Labs  Lab 06/13/24 1155 06/15/24 0809 06/16/24 0523  AST 16 <10* 11*  ALT 18 13 11   ALKPHOS 70 61 77  BILITOT 0.5 0.3 <0.2  PROT 6.0* 5.1* 5.0*  ALBUMIN 3.6 2.8* 2.8*   CBG: Recent Labs  Lab 06/14/24 0802 06/15/24 0746  GLUCAP 75 88    Recent Results (from the past 240 hours)  Resp panel by RT-PCR (RSV, Flu A&B, Covid) Anterior Nasal Swab     Status: None   Collection Time: 06/13/24 11:56 AM   Specimen: Anterior Nasal Swab  Result Value Ref Range Status   SARS Coronavirus 2 by RT PCR NEGATIVE NEGATIVE Final    Comment: (NOTE) SARS-CoV-2 target nucleic acids are NOT DETECTED.  The SARS-CoV-2 RNA is generally detectable in upper respiratory specimens during the acute phase of infection. The lowest concentration of SARS-CoV-2 viral copies this assay can detect is 138 copies/mL. A negative result does not preclude SARS-Cov-2 infection and should not be used as the sole basis for treatment or other patient management decisions. A negative result may occur with  improper specimen collection/handling, submission of specimen other than nasopharyngeal swab, presence of viral mutation(s) within the areas targeted by this assay, and inadequate number of viral copies(<138 copies/mL). A negative result must be combined  with clinical observations, patient history, and epidemiological information. The expected result is Negative.  Fact Sheet for Patients:  bloggercourse.com  Fact Sheet for Healthcare Providers:  seriousbroker.it  This test is no t yet approved or cleared by the [place]  FDA and  has been authorized for detection and/or diagnosis of SARS-CoV-2 by FDA under an Emergency Use Authorization (EUA). This EUA will remain  in effect (meaning this test can be used) for the duration of the COVID-19 declaration under Section 564(b)(1) of the Act, 21 U.S.C.section 360bbb-3(b)(1), unless the authorization is terminated  or revoked sooner.       Influenza A by PCR NEGATIVE NEGATIVE Final   Influenza B by PCR NEGATIVE NEGATIVE Final    Comment: (NOTE) The Xpert Xpress SARS-CoV-2/FLU/RSV plus assay is intended as an aid in the diagnosis of influenza from Nasopharyngeal swab specimens and should not be used as a sole basis for treatment. Nasal washings and aspirates are unacceptable for Xpert Xpress SARS-CoV-2/FLU/RSV testing.  Fact Sheet for Patients: bloggercourse.com  Fact Sheet for Healthcare Providers: seriousbroker.it  This test is not yet approved or cleared by the [place]  FDA and has been authorized for detection and/or diagnosis of SARS-CoV-2 by FDA under an Emergency Use Authorization (EUA). This EUA will remain in effect (meaning this test can be used) for the duration of the COVID-19 declaration under Section 564(b)(1) of the Act, 21 U.S.C. section 360bbb-3(b)(1), unless the authorization is terminated or revoked.     Resp Syncytial Virus by PCR NEGATIVE NEGATIVE Final    Comment: (NOTE) Fact Sheet for Patients: bloggercourse.com  Fact Sheet for Healthcare Providers: seriousbroker.it  This test is not yet approved  or cleared by the [place]  FDA and has been authorized for detection and/or diagnosis of SARS-CoV-2 by FDA under an Emergency Use Authorization (EUA). This EUA will remain in effect (meaning this test can be used) for the duration of the COVID-19 declaration under Section 564(b)(1) of the Act, 21 U.S.C. section 360bbb-3(b)(1), unless the authorization is terminated or revoked.  Performed at [facility], [address] [place], [place] [address]   Blood Culture (routine x 2)     Status: None (Preliminary result)  Collection Time: 06/13/24 12:12 PM   Specimen: BLOOD  Result Value Ref Range Status   Specimen Description BLOOD LEFT ANTECUBITAL  Final   Special Requests   Final    BOTTLES DRAWN AEROBIC AND ANAEROBIC Blood Culture results may not be optimal due to an inadequate volume of blood received in culture bottles   Culture   Final    NO GROWTH 3 DAYS Performed at [facility], [address] [place], [place] [address]    Report Status PENDING  Incomplete  Blood Culture (routine x 2)     Status: None (Preliminary result)   Collection Time: 06/13/24 12:17 PM   Specimen: BLOOD  Result Value Ref Range Status   Specimen Description BLOOD RIGHT ANTECUBITAL  Final   Special Requests   Final    BOTTLES DRAWN AEROBIC AND ANAEROBIC Blood Culture results may not be optimal due to an inadequate volume of blood received in culture bottles   Culture   Final    NO GROWTH 3 DAYS Performed at [facility], [address] [place], [place] [address]    Report Status PENDING  Incomplete  Respiratory (~20 pathogens) panel by PCR     Status: None   Collection Time: 06/13/24  6:26 PM   Specimen: Nasopharyngeal Swab; Respiratory  Result Value Ref Range Status   Adenovirus NOT DETECTED NOT DETECTED Final   Coronavirus 229E NOT DETECTED NOT DETECTED Final    Comment: (NOTE) The Coronavirus on the Respiratory Panel, DOES NOT test for the novel  Coronavirus  (2019 nCoV)    Coronavirus HKU1 NOT DETECTED NOT DETECTED Final   Coronavirus NL63 NOT DETECTED NOT DETECTED Final   Coronavirus OC43 NOT DETECTED NOT DETECTED Final   Metapneumovirus NOT DETECTED NOT DETECTED Final   Rhinovirus / Enterovirus NOT DETECTED NOT DETECTED Final   Influenza A NOT DETECTED NOT DETECTED Final   Influenza B NOT DETECTED NOT DETECTED Final   Parainfluenza Virus 1 NOT DETECTED NOT DETECTED Final   Parainfluenza Virus 2 NOT DETECTED NOT DETECTED Final   Parainfluenza Virus 3 NOT DETECTED NOT DETECTED Final   Parainfluenza Virus 4 NOT DETECTED NOT DETECTED Final   Respiratory Syncytial Virus NOT DETECTED NOT DETECTED Final   Bordetella pertussis NOT DETECTED NOT DETECTED Final   Bordetella Parapertussis NOT DETECTED NOT DETECTED Final   Chlamydophila pneumoniae NOT DETECTED NOT DETECTED Final   Mycoplasma pneumoniae NOT DETECTED NOT DETECTED Final    Comment: Performed at [facility], [address] [place], [place] [address]  C Difficile Quick Screen w PCR reflex     Status: None   Collection Time: 06/14/24 12:15 PM   Specimen: STOOL  Result Value Ref Range Status   C Diff antigen NEGATIVE NEGATIVE Final   C Diff toxin NEGATIVE NEGATIVE Final   C Diff interpretation No C. difficile detected.  Final    Comment: Performed at [facility], [address], [place], [place] [address]     Radiology Studies: US  Venous Img Lower Unilateral Right (DVT) Result Date: 06/15/2024 CLINICAL DATA:  Right lower extremity edema. EXAM: RIGHT LOWER EXTREMITY VENOUS DOPPLER ULTRASOUND TECHNIQUE: Gray-scale sonography with graded compression, as well as color Doppler and duplex ultrasound were performed to evaluate the lower extremity deep venous systems from the level of the common femoral vein and including the common femoral, femoral, profunda femoral, popliteal and calf veins including the posterior tibial, peroneal and gastrocnemius veins when visible. The  superficial great saphenous vein was  also interrogated. Spectral Doppler was utilized to evaluate flow at rest and with distal augmentation maneuvers in the common femoral, femoral and popliteal veins. COMPARISON:  None Available. FINDINGS: Contralateral Common Femoral Vein: Respiratory phasicity is normal and symmetric with the symptomatic side. No evidence of thrombus. Normal compressibility. Common Femoral Vein: No evidence of thrombus. Normal compressibility, respiratory phasicity and response to augmentation. Saphenofemoral Junction: No evidence of thrombus. Normal compressibility and flow on color Doppler imaging. Profunda Femoral Vein: No evidence of thrombus. Normal compressibility and flow on color Doppler imaging. Femoral Vein: No evidence of thrombus. Normal compressibility, respiratory phasicity and response to augmentation. Popliteal Vein: No evidence of thrombus. Normal compressibility, respiratory phasicity and response to augmentation. Calf Veins: No evidence of thrombus. Normal compressibility and flow on color Doppler imaging. Superficial Great Saphenous Vein: No evidence of thrombus. Normal compressibility. Venous Reflux:  None. Other Findings: No evidence of superficial thrombophlebitis or abnormal fluid collection. IMPRESSION: No evidence of right lower extremity deep venous thrombosis. Electronically Signed   By: Marcey Moan M.D.   On: 06/15/2024 17:21    Scheduled Meds:  acyclovir   400 mg Oral Daily   apixaban   5 mg Oral BID   fluticasone  furoate-vilanterol  1 puff Inhalation Daily   gabapentin   600 mg Oral TID   hydrocortisone   25 mg Rectal BID   hydrOXYzine   10 mg Oral QHS   pantoprazole   40 mg Oral Daily   sodium chloride  flush  3 mL Intravenous Q12H   traZODone   50 mg Oral QHS   Continuous Infusions:  ceFEPime  (MAXIPIME ) IV 2 g (06/16/24 0835)   metronidazole  500 mg (06/15/24 2332)     LOS: 3 days  MDM: Patient is high risk for one or more organ failure.  They  necessitate ongoing hospitalization for continued IV therapies and subsequent lab monitoring. Total time spent interpreting labs and vitals, reviewing the medical record, coordinating care amongst consultants and care team members, directly assessing and discussing care with the patient and/or family: 55 min Jeremi Losito, DO [facility]  To contact the attending physician between 7A-7P please use Epic Chat. To contact the covering physician during after hours 7P-7A, please review Amion.  06/16/2024, 12:30 PM   *This document has been created with the assistance of dictation software. Please excuse typographical errors. *   "

## 2024-06-16 NOTE — Plan of Care (Signed)
  Problem: Education: Goal: Knowledge of General Education information will improve Description: Including pain rating scale, medication(s)/side effects and non-pharmacologic comfort measures Outcome: Progressing   Problem: Health Behavior/Discharge Planning: Goal: Ability to manage health-related needs will improve Outcome: Progressing   Problem: Clinical Measurements: Goal: Ability to maintain clinical measurements within normal limits will improve Outcome: Progressing   Problem: Activity: Goal: Risk for activity intolerance will decrease Outcome: Progressing   Problem: Nutrition: Goal: Adequate nutrition will be maintained Outcome: Progressing   Problem: Elimination: Goal: Will not experience complications related to bowel motility Outcome: Progressing   Problem: Pain Managment: Goal: General experience of comfort will improve and/or be controlled Outcome: Progressing   Problem: Safety: Goal: Ability to remain free from injury will improve Outcome: Progressing   Problem: Skin Integrity: Goal: Risk for impaired skin integrity will decrease Outcome: Progressing

## 2024-06-17 ENCOUNTER — Other Ambulatory Visit: Payer: Self-pay

## 2024-06-17 DIAGNOSIS — R5081 Fever presenting with conditions classified elsewhere: Secondary | ICD-10-CM | POA: Diagnosis not present

## 2024-06-17 DIAGNOSIS — D709 Neutropenia, unspecified: Secondary | ICD-10-CM | POA: Diagnosis not present

## 2024-06-17 LAB — GLUCOSE, CAPILLARY: Glucose-Capillary: 110 mg/dL — ABNORMAL HIGH (ref 70–99)

## 2024-06-17 MED ORDER — LEVOFLOXACIN 500 MG PO TABS
500.0000 mg | ORAL_TABLET | Freq: Every day | ORAL | 0 refills | Status: AC
  Filled 2024-06-17: qty 2, 2d supply, fill #0

## 2024-06-17 MED ORDER — APIXABAN 5 MG PO TABS
5.0000 mg | ORAL_TABLET | Freq: Two times a day (BID) | ORAL | 0 refills | Status: DC
  Filled 2024-06-17: qty 60, 30d supply, fill #0

## 2024-06-17 MED ORDER — METRONIDAZOLE 500 MG PO TABS
500.0000 mg | ORAL_TABLET | Freq: Two times a day (BID) | ORAL | 0 refills | Status: AC
  Filled 2024-06-17: qty 4, 2d supply, fill #0

## 2024-06-17 NOTE — Progress Notes (Signed)
 Physical Therapy Treatment Patient Details Name: Erin [name] MRN: [id number] DOB: 10-05-1945 Today's Date: 06/17/2024   History of Present Illness Pt is a 78 y.o. female presenting to ED with rectal pain, dizziness and fevers. MD assessment includes neutropenic fever. 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    PT Comments  Pt tolerates treatment well today and was able to demo improvements in overall assist levels, standing balance, safety awareness, quality of gait, and ambulation distance since last session. Today she is supervision for transfers and multiple bouts of gait with RW. She demo's good safety awareness during transfers and gait without cues. VSS throughout session with mildly elevated HR during activity (114bpm highest). Due to presentation and adequate progress towards goal, DC rec has been updated and pt/daughter are agreeable. Pt will continue to benefit from skilled acute PT services to address deficits for return to baseline function. Will continue per POC.  Encourage OOB mobility with nursing and mobility tech for meals and toileting for continued progress towards goals and maintenance of IND with functional mobility while hospitalized.      If plan is discharge home, recommend the following: A little help with bathing/dressing/bathroom;Help with stairs or ramp for entrance;Assist for transportation;Assistance with cooking/housework   Can travel by private vehicle     Yes  Equipment Recommendations  None recommended by PT    Recommendations for Other Services       Precautions / Restrictions Precautions Precautions: Fall Restrictions Weight Bearing Restrictions Per Provider Order: No     Mobility  Bed Mobility               General bed mobility comments: seated in recliner upon arrival    Transfers                   General  transfer comment: supervision for safety to perform multiple STS from recliner with RW, demo's good sequencing and hand placement without cueing. poor eccentric lowering noted without proper hand placement.    Ambulation/Gait   Gait Distance (Feet): 48 Feet           General Gait Details: initially CGA progressing to supervision for safety to ambulate 2 bouts in room with RW, demo's good cadence, decreased step length/foot clearance, and good upright posture with less reliance on RW     Balance Overall balance assessment: Needs assistance Sitting-balance support: Feet supported Sitting balance-[name] Scale: Good     Standing balance support: Bilateral upper extremity supported, During functional activity Standing balance-[name] Scale: Fair Standing balance comment: RW                            Communication Communication Communication: No apparent difficulties  Cognition Arousal: Alert Behavior During Therapy: WFL for tasks assessed/performed                             Following commands: Intact         Exercises Other Exercises Other Exercises: Pt edu re: PT role/POC, DC recs, safety with mobility, amb to/from bathroom, OOB daily with nursing for meals, call for help, safety with use of RW, energy conservation.    General Comments General comments (skin integrity, edema, etc.): HR elevated between 99-107bpm during treatment, denies DOE/SOB, SpO2 at 99% on RA.      Pertinent Vitals/Pain  Pain Assessment Pain Assessment: No/denies pain     PT Goals (current goals can now be found in the care plan section) Acute Rehab PT Goals Patient Stated Goal: get back to feeling better PT Goal Formulation: With patient Time For Goal Achievement: 06/28/24 Potential to Achieve Goals: Good Progress towards PT goals: Progressing toward goals    Frequency    Min 2X/week       AM-PAC PT 6 Clicks Mobility   Outcome Measure  Help needed turning  from your back to your side while in a flat bed without using bedrails?: A Little Help needed moving from lying on your back to sitting on the side of a flat bed without using bedrails?: A Little Help needed moving to and from a bed to a chair (including a wheelchair)?: A Little Help needed standing up from a chair using your arms (e.g., wheelchair or bedside chair)?: A Little Help needed to walk in hospital room?: A Little Help needed climbing 3-5 steps with a railing? : A Little 6 Click Score: 18    End of Session Equipment Utilized During Treatment: Gait belt Activity Tolerance: Patient tolerated treatment well Patient left: in chair;with call bell/phone within reach;with chair alarm set Nurse Communication: Mobility status PT Visit Diagnosis: Unsteadiness on feet (R26.81);Difficulty in walking, not elsewhere classified (R26.2)     Time: 9040-8980 PT Time Calculation (min) (ACUTE ONLY): 20 min  Charges:    $Therapeutic Activity: 8-22 mins PT General Charges $$ ACUTE PT VISIT: 1 Visit                      [name], PT, DPT 10:51 AM,06/17/2024 Physical Therapist - [facility]

## 2024-06-17 NOTE — Discharge Summary (Signed)
 " Physician Discharge Summary   Patient: Erin [name] MRN: [id number] DOB: [birth year]  Admit date:     06/13/2024  Discharge date: 06/17/2024  Discharge Physician: AIDA CHO   PCP: [facility]   Recommendations at discharge:   Follow-up with PCP in 1 week  Discharge Diagnoses: Principal Problem:   Neutropenic fever Active Problems:   GERD without esophagitis   Anxiety and depression   Lymphoma (HCC)   CKD (chronic kidney disease) stage 3, GFR 30-59 ml/min (HCC)   Chronic asthma  Resolved Problems:   * No resolved hospital problems. *  Hospital Course:  Erin [name] is a 78 year old female with hypertension, recent diagnosis of recurrent lymphoma R-CHOP, asthma, CKD stage IIIb, who presented to the ED with rectal pain, dizziness, fever and general weakness.  Symptoms progressively got worse and she felt so weak she could not ambulate.  Vitals in the ED: Temperature 103.1 F, respiratory 16, pulse 91, BP 97/52, oxygen saturation 91% on room air.   CT abdomen and pelvis IMPRESSION: 1. Asymmetric wall thickening and mucosal enhancement along the rectum/anal canal, which may represent changes of proctitis or neoplasm. Correlation with physical exam findings recommended. 2. Curvilinear hypodensity layering dependently within the urinary bladder (axial 79), which may represent mixing artifact from excreted contrast. Correlation with urinalysis recommended to exclude blood products or infectious debris. 3. Descending and sigmoid colonic diverticulosis. No changes of acute diverticulitis. 4. Findings consistent with pelvic floor laxity.   Aortic Atherosclerosis (ICD10-I70.0).   Labs significant for WBC of 0.7 with absolute neutrophil count of 0.2 and an absolute lymphocyte count of 0.4, hemoglobin 10.9, platelet 157, sodium 133, potassium 3.4, creatinine 1.35   She was admitted to the hospital with neutropenic fever and acute  proctitis.  Assessment and Plan:   Neutropenic fever: Neutropenia and fever have resolved. No growth on blood cultures. Pancytopenia, likely related to chemotherapy   Proctitis, diarrhea: Improved. She will be discharged on oral Levaquin  and Flagyl  for 2 more days. S/p treatment with IV cefepime  and IV Flagyl . She initially received 1 dose each of IV vancomycin  and IV aztreonam .   Right lower extremity swelling: Swelling has improved.  Venous duplex negative for DVT.   Recurrent lymphoma: Initially diagnosed in 2009.  She went into remission but lymphoma recurred in 2015 and then again in 2025. She was recently started on R-CHOP with plan for chemotherapy every 21 days x 3 cycles.  First dose was on 06/04/2024. Continue acyclovir .   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  PT initially recommended discharge to SNF.  However, her condition improved and she did better with PT.  PT updated recommendations to home health therapy.  She is eager to go home today and she thinks she will do well at home.   She is deemed stable for discharge home today.  Discharge plan was discussed with Ms. Greig Seip, daughter, over the phone phone in patient's room.  We went over patient's home medications and she was able to tell me the medications that she no longer takes and the meds that she continues to take.        Consultants: Oncologist Procedures performed: None Disposition: Home health Diet recommendation:  Carb modified diet DISCHARGE MEDICATION: Allergies as of 06/17/2024       Reactions   Penicillins Rash  Tolerates cephalosporin   Prednisone  Hives   Sulfa Antibiotics Hives, Shortness Of Breath   Enalapril    Hypotension    Medrol  [methylprednisolone ] Rash        Medication List     STOP taking  these medications    alendronate  70 MG tablet Commonly known as: FOSAMAX    diphenhydrAMINE  25 MG tablet Commonly known as: BENADRYL    ferrous sulfate  325 (65 FE) MG tablet Commonly known as: FerrouSul   ondansetron  8 MG tablet Commonly known as: Zofran    potassium chloride  SA 20 MEQ tablet Commonly known as: KLOR-CON  M   predniSONE  50 MG tablet Commonly known as: DELTASONE    traMADol  50 MG tablet Commonly known as: Ultram        TAKE these medications    acetaminophen  325 MG tablet Commonly known as: TYLENOL  Take 325-650 mg by mouth every 6 (six) hours as needed for moderate pain or headache.   acyclovir  400 MG tablet Commonly known as: ZOVIRAX  Take 1 tablet (400 mg total) by mouth daily.   albuterol  (2.5 MG/3ML) 0.083% nebulizer solution Commonly known as: PROVENTIL  Take 2.5 mg by nebulization every 6 (six) hours as needed for wheezing or shortness of breath.   albuterol  108 (90 Base) MCG/ACT inhaler Commonly known as: VENTOLIN  HFA Inhale 1-2 puffs into the lungs every 6 (six) hours as needed for wheezing or shortness of breath.   allopurinol  300 MG tablet Commonly known as: ZYLOPRIM  Take 1 tablet (300 mg total) by mouth daily.   apixaban  5 MG Tabs tablet Commonly known as: ELIQUIS  Take 1 tablet (5 mg total) by mouth 2 (two) times daily.   aspirin  EC 81 MG tablet Take 81 mg by mouth daily. Swallow whole.   B-12 2500 MCG Tabs Take 2,500 mcg by mouth daily.   Biotin  5 MG Caps Take 5 mg by mouth daily.   CALCIUM  600 + D PO Take 1 tablet by mouth daily.   citalopram  40 MG tablet Commonly known as: CELEXA  Take 40 mg by mouth every morning.   furosemide  20 MG tablet Commonly known as: LASIX  Take 20 mg by mouth daily.   gabapentin  600 MG tablet Commonly known as: NEURONTIN  Take 600 mg by mouth 3 (three) times daily.   hydrocortisone  2.5 % cream Apply 1 Application topically 2 (two) times daily.   hydrOXYzine  10 MG tablet Commonly known as:  ATARAX  Take 10 mg by mouth at bedtime.   levofloxacin  500 MG tablet Commonly known as: LEVAQUIN  Take 1 tablet (500 mg total) by mouth daily.   lidocaine  5 % Commonly known as: LIDODERM  Place 1 patch onto the skin daily as needed.   LUBRICANT EYE DROPS OP Place 1 drop into both eyes daily as needed (dry eyes).   metroNIDAZOLE  500 MG tablet Commonly known as: FLAGYL  Take 1 tablet (500 mg total) by mouth every 12 (twelve) hours for 2 days.   mometasone -formoterol  200-5 MCG/ACT Aero Commonly known as: Dulera  Inhale 2 puffs into the lungs 2 (two) times daily.   montelukast  10 MG tablet Commonly known as: Singulair  Take 1 tablet (10 mg total) by mouth at bedtime.   multivitamin with minerals Tabs tablet Take 1 tablet by mouth daily.   omeprazole 20 MG capsule Commonly known as: PRILOSEC Take 20 mg by mouth 2 (two) times daily.   tiZANidine  2 MG tablet Commonly known as: ZANAFLEX  Take 2 mg by mouth 3 (three) times daily as needed for muscle spasms.   traZODone  100 MG tablet Commonly known as: DESYREL   Take 50 mg by mouth at bedtime.        Follow-up Information     [facility] Follow up.   Specialty: General Practice Why: hospital follow up Contact information: [address] [place] [address] [phone]                Discharge Exam: Filed Weights   06/15/24 0500 06/16/24 0500 06/17/24 0422  Weight: 73.2 kg 72.8 kg 83 kg   GEN: NAD SKIN: Warm and dry EYES: No pallor or icterus ENT: MMM CV: RRR PULM: CTA B ABD: soft, ND, NT, +BS CNS: AAO x 3, non focal EXT: No edema or tenderness   Condition at discharge: good  The results of significant diagnostics from this hospitalization (including imaging, microbiology, ancillary and laboratory) are listed below for reference.   Imaging Studies: US  Venous Img Lower Unilateral Right (DVT) Result Date: 06/15/2024 CLINICAL DATA:  Right lower extremity edema.  EXAM: RIGHT LOWER EXTREMITY VENOUS DOPPLER ULTRASOUND TECHNIQUE: Gray-scale sonography with graded compression, as well as color Doppler and duplex ultrasound were performed to evaluate the lower extremity deep venous systems from the level of the common femoral vein and including the common femoral, femoral, profunda femoral, popliteal and calf veins including the posterior tibial, peroneal and gastrocnemius veins when visible. The superficial great saphenous vein was also interrogated. Spectral Doppler was utilized to evaluate flow at rest and with distal augmentation maneuvers in the common femoral, femoral and popliteal veins. COMPARISON:  None Available. FINDINGS: Contralateral Common Femoral Vein: Respiratory phasicity is normal and symmetric with the symptomatic side. No evidence of thrombus. Normal compressibility. Common Femoral Vein: No evidence of thrombus. Normal compressibility, respiratory phasicity and response to augmentation. Saphenofemoral Junction: No evidence of thrombus. Normal compressibility and flow on color Doppler imaging. Profunda Femoral Vein: No evidence of thrombus. Normal compressibility and flow on color Doppler imaging. Femoral Vein: No evidence of thrombus. Normal compressibility, respiratory phasicity and response to augmentation. Popliteal Vein: No evidence of thrombus. Normal compressibility, respiratory phasicity and response to augmentation. Calf Veins: No evidence of thrombus. Normal compressibility and flow on color Doppler imaging. Superficial Great Saphenous Vein: No evidence of thrombus. Normal compressibility. Venous Reflux:  None. Other Findings: No evidence of superficial thrombophlebitis or abnormal fluid collection. IMPRESSION: No evidence of right lower extremity deep venous thrombosis. Electronically Signed   By: Marcey Moan M.D.   On: 06/15/2024 17:21   CT ABDOMEN PELVIS W CONTRAST Result Date: 06/13/2024 CLINICAL DATA:  sepsis, rectal pain EXAM: CT ABDOMEN  AND PELVIS WITH CONTRAST TECHNIQUE: Multidetector CT imaging of the abdomen and pelvis was performed using the standard protocol following bolus administration of intravenous contrast. RADIATION DOSE REDUCTION: This exam was performed according to the departmental dose-optimization program which includes automated exposure control, adjustment of the mA and/or kV according to patient size and/or use of iterative reconstruction technique. CONTRAST:  80mL OMNIPAQUE  IOHEXOL  300 MG/ML  SOLN COMPARISON:  May 22, 2024, April 27, 2024 FINDINGS: Lower chest: No focal airspace consolidation or pleural effusion.Multifocal subsegmental atelectasis in the lung bases. Hepatobiliary: No mass.No radiopaque stones or wall thickening of the gallbladder. Mild dilation of the common bile duct, likely related to the prior cholecystectomy. The portal veins are patent. Pancreas: No mass or main ductal dilation. No peripancreatic inflammation or fluid collection. Spleen: Normal size. No mass. Adrenals/Urinary Tract: No adrenal masses. Subcentimeter hypodensity noted in the left kidney, too small to definitively characterize, but likely a small cyst. No nephrolithiasis  or hydronephrosis. The urinary bladder is distended without wall thickening. Curvilinear hypodensity layering dependently within the urinary bladder (axial 79). Stomach/Bowel: The stomach is decompressed without focal abnormality. No small bowel wall thickening or inflammation. No small bowel obstruction.Normal appendix. Descending and sigmoid colonic diverticulosis. No changes of acute diverticulitis. Asymmetric wall thickening and enhancement along the rectum/anal canal. Vascular/Lymphatic: No aortic aneurysm. Scattered aortoiliac atherosclerosis. No intraabdominal or pelvic lymphadenopathy. Reproductive: Hysterectomy. No concerning adnexal mass.No free pelvic fluid. Findings consistent with pelvic floor laxity. Other: No pneumoperitoneum, ascites, or mesenteric  inflammation. Musculoskeletal: No acute fracture or destructive lesion. Osteopenia. Mild grade 1 anterolisthesis of L4 on L5. Multilevel degenerative disc disease of the spine. Thoracic DISH. IMPRESSION: 1. Asymmetric wall thickening and mucosal enhancement along the rectum/anal canal, which may represent changes of proctitis or neoplasm. Correlation with physical exam findings recommended. 2. Curvilinear hypodensity layering dependently within the urinary bladder (axial 79), which may represent mixing artifact from excreted contrast. Correlation with urinalysis recommended to exclude blood products or infectious debris. 3. Descending and sigmoid colonic diverticulosis. No changes of acute diverticulitis. 4. Findings consistent with pelvic floor laxity. Aortic Atherosclerosis (ICD10-I70.0). Electronically Signed   By: Rogelia Myers M.D.   On: 06/13/2024 15:10   DG Chest Port 1 View Result Date: 06/13/2024 CLINICAL DATA:  Questionable sepsis - evaluate for abnormality EXAM: PORTABLE CHEST - 1 VIEW COMPARISON:  January 14, 2024, May 22, 2024 FINDINGS: Subsegmental atelectasis in the left lung base. No focal airspace consolidation, pleural effusion, or pneumothorax. No cardiomegaly. Right chest port is similarly positioned terminating at the cavoatrial junction. Tortuous aorta with aortic atherosclerosis. No acute fracture or destructive lesions. Multilevel thoracic osteophytosis. Remote, healed rib fractures. Osteoarthritis of both glenohumeral joints. IMPRESSION: No acute cardiopulmonary abnormality. Electronically Signed   By: Rogelia Myers M.D.   On: 06/13/2024 14:00   NM PET Image Initial (PI) Skull Base To Thigh (F-18 FDG) Result Date: 05/22/2024 CLINICAL DATA:  Subsequent treatment strategy for lymphoma. Right cervical adenopathy on recent CT. 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 EXAM: NUCLEAR MEDICINE PET SKULL BASE TO THIGH TECHNIQUE: 8.96 mCi F-18 FDG was injected intravenously. Full-ring PET imaging was  performed from the skull base to thigh after the radiotracer. CT data was obtained and used for attenuation correction and anatomic localization. Fasting blood glucose: 133 mg/dl COMPARISON:  PET-CT 97/89/7983. CT of the neck, chest, abdomen and pelvis 04/27/2024. FINDINGS: Mediastinal blood pool activity: SUV max 3.0 Liver activity: SUV max 4.4 NECK: The recently demonstrated and biopsied mass in the right neck demonstrates marked hypermetabolic activity (SUV max 31.4). This mass involves level IIa lymph nodes and measures 4.8 x 3.2 cm on image 17/2. No other hypermetabolic cervical lymph nodes are identified.Fairly symmetric activity within the lymphoid tissue of Waldeyer's ring is within physiologic limits. No suspicious activity identified within the pharyngeal mucosal space. Incidental CT findings: none CHEST: There are no hypermetabolic mediastinal, hilar or axillary lymph nodes. No hypermetabolic pulmonary activity or suspicious nodularity. Incidental CT findings: Stable atelectasis or scarring at both lung bases. Right IJ Port-A-Cath extends to the superior cavoatrial junction. Mild atherosclerosis of the aorta, great vessels and coronary arteries. ABDOMEN/PELVIS: There is no hypermetabolic activity within the liver, adrenal glands, spleen or pancreas. There is no hypermetabolic nodal activity in the abdomen or pelvis. Incidental CT findings: Status post cholecystectomy. Scattered calcified splenic granulomas. Moderate diverticulosis of the descending and sigmoid colon without evidence of acute inflammation. Previous hysterectomy without evidence of adnexal mass. Mild aortoiliac atherosclerosis. SKELETON: There is no  hypermetabolic activity to suggest osseous metastatic disease. Incidental CT findings: Relatively mild multilevel spondylosis. IMPRESSION: 1. The recently demonstrated and biopsied right cervical mass demonstrates marked hypermetabolic activity consistent with recurrent lymphoma ([place] 5).  2. No other evidence of recurrent lymphoma in the neck, chest, abdomen or pelvis. 3.  Aortic Atherosclerosis (ICD10-I70.0). Electronically Signed   By: Elsie Perone M.D.   On: 05/22/2024 13:01    Microbiology: Results for orders placed or performed during the hospital encounter of 06/13/24  Resp panel by RT-PCR (RSV, Flu A&B, Covid) Anterior Nasal Swab     Status: None   Collection Time: 06/13/24 11:56 AM   Specimen: Anterior Nasal Swab  Result Value Ref Range Status   SARS Coronavirus 2 by RT PCR NEGATIVE NEGATIVE Final    Comment: (NOTE) SARS-CoV-2 target nucleic acids are NOT DETECTED.  The SARS-CoV-2 RNA is generally detectable in upper respiratory specimens during the acute phase of infection. The lowest concentration of SARS-CoV-2 viral copies this assay can detect is 138 copies/mL. A negative result does not preclude SARS-Cov-2 infection and should not be used as the sole basis for treatment or other patient management decisions. A negative result may occur with  improper specimen collection/handling, submission of specimen other than nasopharyngeal swab, presence of viral mutation(s) within the areas targeted by this assay, and inadequate number of viral copies(<138 copies/mL). A negative result must be combined with clinical observations, patient history, and epidemiological information. The expected result is Negative.  Fact Sheet for Patients:  bloggercourse.com  Fact Sheet for Healthcare Providers:  seriousbroker.it  This test is no t yet approved or cleared by the [place]  FDA and  has been authorized for detection and/or diagnosis of SARS-CoV-2 by FDA under an Emergency Use Authorization (EUA). This EUA will remain  in effect (meaning this test can be used) for the duration of the COVID-19 declaration under Section 564(b)(1) of the Act, 21 U.S.C.section 360bbb-3(b)(1), unless the authorization is terminated   or revoked sooner.       Influenza A by PCR NEGATIVE NEGATIVE Final   Influenza B by PCR NEGATIVE NEGATIVE Final    Comment: (NOTE) The Xpert Xpress SARS-CoV-2/FLU/RSV plus assay is intended as an aid in the diagnosis of influenza from Nasopharyngeal swab specimens and should not be used as a sole basis for treatment. Nasal washings and aspirates are unacceptable for Xpert Xpress SARS-CoV-2/FLU/RSV testing.  Fact Sheet for Patients: bloggercourse.com  Fact Sheet for Healthcare Providers: seriousbroker.it  This test is not yet approved or cleared by the [place]  FDA and has been authorized for detection and/or diagnosis of SARS-CoV-2 by FDA under an Emergency Use Authorization (EUA). This EUA will remain in effect (meaning this test can be used) for the duration of the COVID-19 declaration under Section 564(b)(1) of the Act, 21 U.S.C. section 360bbb-3(b)(1), unless the authorization is terminated or revoked.     Resp Syncytial Virus by PCR NEGATIVE NEGATIVE Final    Comment: (NOTE) Fact Sheet for Patients: bloggercourse.com  Fact Sheet for Healthcare Providers: seriousbroker.it  This test is not yet approved or cleared by the [place]  FDA and has been authorized for detection and/or diagnosis of SARS-CoV-2 by FDA under an Emergency Use Authorization (EUA). This EUA will remain in effect (meaning this test can be used) for the duration of the COVID-19 declaration under Section 564(b)(1) of the Act, 21 U.S.C. section 360bbb-3(b)(1), unless the authorization is terminated or revoked.  Performed at [facility], [address], [place], [place]  [address]   Blood Culture (routine x 2)     Status: None (Preliminary result)   Collection Time: 06/13/24 12:12 PM   Specimen: BLOOD  Result Value Ref Range Status   Specimen Description BLOOD LEFT ANTECUBITAL   Final   Special Requests   Final    BOTTLES DRAWN AEROBIC AND ANAEROBIC Blood Culture results may not be optimal due to an inadequate volume of blood received in culture bottles   Culture   Final    NO GROWTH 4 DAYS Performed at [facility], [address] [place], [place] [address]    Report Status PENDING  Incomplete  Blood Culture (routine x 2)     Status: None (Preliminary result)   Collection Time: 06/13/24 12:17 PM   Specimen: BLOOD  Result Value Ref Range Status   Specimen Description BLOOD RIGHT ANTECUBITAL  Final   Special Requests   Final    BOTTLES DRAWN AEROBIC AND ANAEROBIC Blood Culture results may not be optimal due to an inadequate volume of blood received in culture bottles   Culture   Final    NO GROWTH 4 DAYS Performed at [facility], [address] [place], [place] [address]    Report Status PENDING  Incomplete  Respiratory (~20 pathogens) panel by PCR     Status: None   Collection Time: 06/13/24  6:26 PM   Specimen: Nasopharyngeal Swab; Respiratory  Result Value Ref Range Status   Adenovirus NOT DETECTED NOT DETECTED Final   Coronavirus 229E NOT DETECTED NOT DETECTED Final    Comment: (NOTE) The Coronavirus on the Respiratory Panel, DOES NOT test for the novel  Coronavirus (2019 nCoV)    Coronavirus HKU1 NOT DETECTED NOT DETECTED Final   Coronavirus NL63 NOT DETECTED NOT DETECTED Final   Coronavirus OC43 NOT DETECTED NOT DETECTED Final   Metapneumovirus NOT DETECTED NOT DETECTED Final   Rhinovirus / Enterovirus NOT DETECTED NOT DETECTED Final   Influenza A NOT DETECTED NOT DETECTED Final   Influenza B NOT DETECTED NOT DETECTED Final   Parainfluenza Virus 1 NOT DETECTED NOT DETECTED Final   Parainfluenza Virus 2 NOT DETECTED NOT DETECTED Final   Parainfluenza Virus 3 NOT DETECTED NOT DETECTED Final   Parainfluenza Virus 4 NOT DETECTED NOT DETECTED Final   Respiratory Syncytial Virus NOT DETECTED NOT DETECTED Final   Bordetella  pertussis NOT DETECTED NOT DETECTED Final   Bordetella Parapertussis NOT DETECTED NOT DETECTED Final   Chlamydophila pneumoniae NOT DETECTED NOT DETECTED Final   Mycoplasma pneumoniae NOT DETECTED NOT DETECTED Final    Comment: Performed at [facility], [address], [place], [place] [address]  C Difficile Quick Screen w PCR reflex     Status: None   Collection Time: 06/14/24 12:15 PM   Specimen: STOOL  Result Value Ref Range Status   C Diff antigen NEGATIVE NEGATIVE Final   C Diff toxin NEGATIVE NEGATIVE Final   C Diff interpretation No C. difficile detected.  Final    Comment: Performed at [facility], [address] [place], [place] [address]    Labs: CBC: Recent Labs  Lab 06/13/24 1155 06/14/24 0724 06/15/24 0809 06/16/24 0523  WBC 0.7* 1.6* 4.8 10.1  NEUTROABS 0.2*  --  3.1  --   HGB 10.9* 9.0* 9.5* 9.6*  HCT 32.9* 27.6* 29.1* 29.9*  MCV 92.7 96.8 93.3 95.2  PLT 57* 52* 72* 103*   Basic Metabolic Panel: Recent Labs  Lab 06/13/24 1155 06/14/24 0724 06/15/24 0809  06/16/24 0523  NA 133* 139 138 142  K 3.4* 3.5 3.5 3.8  CL 97* 108 110 110  CO2 28 24 20* 25  GLUCOSE 101* 78 87 95  BUN 20 19 15 15   CREATININE 1.35* 1.15* 1.08* 1.16*  CALCIUM  8.5* 7.5* 7.9* 8.7*  MG 2.8*  --   --  2.3  PHOS  --   --   --  2.4*   Liver Function Tests: Recent Labs  Lab 06/13/24 1155 06/15/24 0809 06/16/24 0523  AST 16 <10* 11*  ALT 18 13 11   ALKPHOS 70 61 77  BILITOT 0.5 0.3 <0.2  PROT 6.0* 5.1* 5.0*  ALBUMIN 3.6 2.8* 2.8*   CBG: Recent Labs  Lab 06/14/24 0802 06/15/24 0746 06/17/24 0751  GLUCAP 75 88 110*    Discharge time spent: greater than 30 minutes.  Signed: AIDA CHO, MD [facility] 06/17/2024 "

## 2024-06-17 NOTE — Progress Notes (Signed)
 Patient taken to dc pick up area via wheelchair,daughter waiting in the car. I did explain dc meds to her over the phone. Took all her personal belonging.

## 2024-06-17 NOTE — Plan of Care (Signed)

## 2024-06-17 NOTE — TOC Progression Note (Signed)
 Transition of Care [facility]) - Progression Note    Patient Details  Name: Erin [name] MRN: [id number] Date of Birth: 1945/08/04  Transition of Care [facility]) CM/SW Contact  Emmert Roethler L Madisen [place], [place] Phone Number: 06/17/2024, 12:15 PM  Clinical Narrative:      CSW received a secure chat advising that the patients daughter does not have transportation available for discharge. CSW contacted the daughter and reviewed the hospitals transportation policy. The daughter was encouraged to contact Medicaid transportation to arrange a ride for the patient upon discharge. CSW also advised that if a taxi service is required, it is preferred that the family assume responsibility for payment.  The contact number for [employer] was provided. The daughter advised that she would contact CSW once transportation has been scheduled. CSW followed up via secure chat with updated information and inquired whether the patient is able to utilize bus transportation. CSW is awaiting a response.               Expected Discharge Plan and Services         Expected Discharge Date: 06/17/24                                     Social Drivers of Health (SDOH) Interventions SDOH Screenings   Food Insecurity: No Food Insecurity (06/14/2024)  Housing: Unknown (06/14/2024)  Transportation Needs: No Transportation Needs (06/14/2024)  Utilities: Not At Risk (06/14/2024)  Depression (PHQ2-9): Low Risk (06/07/2024)  Financial Resource Strain: Low Risk  (02/09/2024)   Received from [facility]  Social Connections: Moderately Isolated (06/14/2024)  Tobacco Use: Low Risk (06/04/2024)    Readmission Risk Interventions     No data to display

## 2024-06-17 NOTE — Plan of Care (Signed)
  Problem: Clinical Measurements: Goal: Ability to maintain clinical measurements within normal limits will improve Outcome: Progressing   Problem: Activity: Goal: Risk for activity intolerance will decrease Outcome: Progressing   Problem: Nutrition: Goal: Adequate nutrition will be maintained Outcome: Progressing   Problem: Coping: Goal: Level of anxiety will decrease Outcome: Progressing   Problem: Elimination: Goal: Will not experience complications related to bowel motility Outcome: Progressing   Problem: Pain Managment: Goal: General experience of comfort will improve and/or be controlled Outcome: Progressing

## 2024-06-18 LAB — CULTURE, BLOOD (ROUTINE X 2)
Culture: NO GROWTH
Culture: NO GROWTH

## 2024-07-02 ENCOUNTER — Inpatient Hospital Stay (HOSPITAL_BASED_OUTPATIENT_CLINIC_OR_DEPARTMENT_OTHER): Admitting: Internal Medicine

## 2024-07-02 ENCOUNTER — Encounter: Payer: Self-pay | Admitting: Internal Medicine

## 2024-07-02 ENCOUNTER — Telehealth: Payer: Self-pay | Admitting: *Deleted

## 2024-07-02 ENCOUNTER — Inpatient Hospital Stay

## 2024-07-02 ENCOUNTER — Inpatient Hospital Stay: Attending: Internal Medicine

## 2024-07-02 VITALS — BP 84/57 | HR 86 | Temp 97.3°F | Resp 16 | Ht 63.0 in | Wt 166.2 lb

## 2024-07-02 VITALS — BP 103/65 | HR 96 | Temp 98.0°F | Resp 17

## 2024-07-02 DIAGNOSIS — M549 Dorsalgia, unspecified: Secondary | ICD-10-CM | POA: Insufficient documentation

## 2024-07-02 DIAGNOSIS — C8591 Non-Hodgkin lymphoma, unspecified, lymph nodes of head, face, and neck: Secondary | ICD-10-CM

## 2024-07-02 DIAGNOSIS — D709 Neutropenia, unspecified: Secondary | ICD-10-CM | POA: Diagnosis not present

## 2024-07-02 DIAGNOSIS — N183 Chronic kidney disease, stage 3 unspecified: Secondary | ICD-10-CM | POA: Diagnosis not present

## 2024-07-02 DIAGNOSIS — Z5189 Encounter for other specified aftercare: Secondary | ICD-10-CM | POA: Insufficient documentation

## 2024-07-02 DIAGNOSIS — C8331 Diffuse large B-cell lymphoma, lymph nodes of head, face, and neck: Secondary | ICD-10-CM | POA: Insufficient documentation

## 2024-07-02 DIAGNOSIS — R5081 Fever presenting with conditions classified elsewhere: Secondary | ICD-10-CM | POA: Insufficient documentation

## 2024-07-02 DIAGNOSIS — Z5111 Encounter for antineoplastic chemotherapy: Secondary | ICD-10-CM | POA: Insufficient documentation

## 2024-07-02 DIAGNOSIS — Z5112 Encounter for antineoplastic immunotherapy: Secondary | ICD-10-CM | POA: Insufficient documentation

## 2024-07-02 DIAGNOSIS — Z7901 Long term (current) use of anticoagulants: Secondary | ICD-10-CM | POA: Diagnosis not present

## 2024-07-02 DIAGNOSIS — I82C11 Acute embolism and thrombosis of right internal jugular vein: Secondary | ICD-10-CM | POA: Diagnosis not present

## 2024-07-02 LAB — CMP (CANCER CENTER ONLY)
ALT: 10 U/L (ref 0–44)
AST: 19 U/L (ref 15–41)
Albumin: 3.7 g/dL (ref 3.5–5.0)
Alkaline Phosphatase: 90 U/L (ref 38–126)
Anion gap: 7 (ref 5–15)
BUN: 18 mg/dL (ref 8–23)
CO2: 28 mmol/L (ref 22–32)
Calcium: 9.1 mg/dL (ref 8.9–10.3)
Chloride: 102 mmol/L (ref 98–111)
Creatinine: 1.27 mg/dL — ABNORMAL HIGH (ref 0.44–1.00)
GFR, Estimated: 43 mL/min — ABNORMAL LOW
Glucose, Bld: 129 mg/dL — ABNORMAL HIGH (ref 70–99)
Potassium: 3.8 mmol/L (ref 3.5–5.1)
Sodium: 137 mmol/L (ref 135–145)
Total Bilirubin: <0.2 mg/dL (ref 0.0–1.2)
Total Protein: 6.2 g/dL — ABNORMAL LOW (ref 6.5–8.1)

## 2024-07-02 LAB — CBC WITH DIFFERENTIAL (CANCER CENTER ONLY)
Abs Immature Granulocytes: 0.03 K/uL (ref 0.00–0.07)
Basophils Absolute: 0.1 K/uL (ref 0.0–0.1)
Basophils Relative: 1 %
Eosinophils Absolute: 0.1 K/uL (ref 0.0–0.5)
Eosinophils Relative: 2 %
HCT: 33.5 % — ABNORMAL LOW (ref 36.0–46.0)
Hemoglobin: 11 g/dL — ABNORMAL LOW (ref 12.0–15.0)
Immature Granulocytes: 1 %
Lymphocytes Relative: 22 %
Lymphs Abs: 1.1 K/uL (ref 0.7–4.0)
MCH: 31.7 pg (ref 26.0–34.0)
MCHC: 32.8 g/dL (ref 30.0–36.0)
MCV: 96.5 fL (ref 80.0–100.0)
Monocytes Absolute: 0.7 K/uL (ref 0.1–1.0)
Monocytes Relative: 14 %
Neutro Abs: 3 K/uL (ref 1.7–7.7)
Neutrophils Relative %: 60 %
Platelet Count: 247 K/uL (ref 150–400)
RBC: 3.47 MIL/uL — ABNORMAL LOW (ref 3.87–5.11)
RDW: 17.4 % — ABNORMAL HIGH (ref 11.5–15.5)
WBC Count: 5 K/uL (ref 4.0–10.5)
nRBC: 0 % (ref 0.0–0.2)

## 2024-07-02 MED ORDER — SODIUM CHLORIDE 0.9 % IV SOLN
750.0000 mg/m2 | Freq: Once | INTRAVENOUS | Status: AC
  Administered 2024-07-02: 1500 mg via INTRAVENOUS
  Filled 2024-07-02: qty 75

## 2024-07-02 MED ORDER — PREDNISONE 50 MG PO TABS: ORAL_TABLET | ORAL | 1 refills | Status: AC

## 2024-07-02 MED ORDER — PALONOSETRON HCL INJECTION 0.25 MG/5ML
0.2500 mg | Freq: Once | INTRAVENOUS | Status: AC
  Administered 2024-07-02: 0.25 mg via INTRAVENOUS
  Filled 2024-07-02: qty 5

## 2024-07-02 MED ORDER — SODIUM CHLORIDE 0.9 % IV SOLN
40.0000 mg/m2 | Freq: Once | INTRAVENOUS | Status: AC
  Administered 2024-07-02: 72 mg via INTRAVENOUS
  Filled 2024-07-02: qty 3.6

## 2024-07-02 MED ORDER — SODIUM CHLORIDE 0.9 % IV SOLN
375.0000 mg/m2 | Freq: Once | INTRAVENOUS | Status: AC
  Administered 2024-07-02: 700 mg via INTRAVENOUS
  Filled 2024-07-02: qty 20

## 2024-07-02 MED ORDER — VINCRISTINE SULFATE CHEMO INJECTION 1 MG/ML
2.0000 mg | Freq: Once | INTRAVENOUS | Status: AC
  Administered 2024-07-02: 2 mg via INTRAVENOUS
  Filled 2024-07-02: qty 2

## 2024-07-02 MED ORDER — SODIUM CHLORIDE 0.9 % IV SOLN
INTRAVENOUS | Status: DC
  Filled 2024-07-02: qty 250

## 2024-07-02 MED ORDER — DEXAMETHASONE SOD PHOSPHATE PF 10 MG/ML IJ SOLN
10.0000 mg | Freq: Once | INTRAMUSCULAR | Status: AC
  Administered 2024-07-02: 10 mg via INTRAVENOUS

## 2024-07-02 MED ORDER — DIPHENHYDRAMINE HCL 25 MG PO TABS
50.0000 mg | ORAL_TABLET | Freq: Once | ORAL | Status: AC
  Administered 2024-07-02: 50 mg via ORAL
  Filled 2024-07-02: qty 2

## 2024-07-02 MED ORDER — ACETAMINOPHEN 325 MG PO TABS
650.0000 mg | ORAL_TABLET | Freq: Once | ORAL | Status: AC
  Administered 2024-07-02: 650 mg via ORAL
  Filled 2024-07-02: qty 2
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
## 2024-07-02 NOTE — Progress Notes (Signed)
"   No concerns today  "

## 2024-07-02 NOTE — Patient Instructions (Signed)
 [facility] - A DEPT OF [facility]  Discharge Instructions: Thank you for choosing [facility] to provide your oncology and hematology care.  If you have a lab appointment with the Cancer Center, please go directly to the Cancer Center and check in at the registration area.  Wear comfortable clothing and clothing appropriate for easy access to any Portacath or PICC line.   We strive to give you quality time with your provider. You may need to reschedule your appointment if you arrive late (15 or more minutes).  Arriving late affects you and other patients whose appointments are after yours.  Also, if you miss three or more appointments without notifying the office, you may be dismissed from the clinic at the providers discretion.      For prescription refill requests, have your pharmacy contact our office and allow 72 hours for refills to be completed.    Today you received the following chemotherapy and/or immunotherapy agents: Vincristine , Cytoxan , Etoposide , Rituxan       To help prevent nausea and vomiting after your treatment, we encourage you to take your nausea medication as directed.  BELOW ARE SYMPTOMS THAT SHOULD BE REPORTED IMMEDIATELY: *FEVER GREATER THAN 100.4 F (38 C) OR HIGHER *CHILLS OR SWEATING *NAUSEA AND VOMITING THAT IS NOT CONTROLLED WITH YOUR NAUSEA MEDICATION *UNUSUAL SHORTNESS OF BREATH *UNUSUAL BRUISING OR BLEEDING *URINARY PROBLEMS (pain or burning when urinating, or frequent urination) *BOWEL PROBLEMS (unusual diarrhea, constipation, pain near the anus) TENDERNESS IN MOUTH AND THROAT WITH OR WITHOUT PRESENCE OF ULCERS (sore throat, sores in mouth, or a toothache) UNUSUAL RASH, SWELLING OR PAIN  UNUSUAL VAGINAL DISCHARGE OR ITCHING   Items with * indicate a potential emergency and should be followed up as soon as possible or go to the Emergency Department if any problems should occur.  Please show the  CHEMOTHERAPY ALERT CARD or IMMUNOTHERAPY ALERT CARD at check-in to the Emergency Department and triage nurse.  Should you have questions after your visit or need to cancel or reschedule your appointment, please contact [facility] - A DEPT OF [facility]  [phone] and follow the prompts.  Office hours are 8:00 a.m. to 4:30 p.m. Monday - Friday. Please note that voicemails left after 4:00 p.m. may not be returned until the following business day.  We are closed weekends and major holidays. You have access to a nurse at all times for urgent questions. Please call the main number to the clinic [phone] and follow the prompts.  For any non-urgent questions, you may also contact your provider using MyChart. We now offer e-Visits for anyone 57 and older to request care online for non-urgent symptoms. For details visit [email or web address].   Also download the MyChart app! Go to the app store, search MyChart, open the app, select [facility], and log in with your MyChart username and password.

## 2024-07-02 NOTE — Progress Notes (Signed)
 Ok per MD proceed with BP 84/57

## 2024-07-02 NOTE — Telephone Encounter (Signed)
 Phone call to daughter, Amy to instruct her to remove furosemide  from her pill box as her BP is now running low. Hold until instructed otherwise. Also prednisone  has been called into pharmacy to take for 2 tablets daily starting on 2nd day of chemo for 5 days. Amy said the hospital discontinued it as this is listed as an allergy for her. Please pick up and start tomorrow.
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
## 2024-07-03 ENCOUNTER — Inpatient Hospital Stay

## 2024-07-03 VITALS — BP 111/63 | HR 85 | Temp 96.8°F | Resp 17

## 2024-07-03 DIAGNOSIS — Z5111 Encounter for antineoplastic chemotherapy: Secondary | ICD-10-CM | POA: Diagnosis not present

## 2024-07-03 DIAGNOSIS — C8591 Non-Hodgkin lymphoma, unspecified, lymph nodes of head, face, and neck: Secondary | ICD-10-CM

## 2024-07-03 MED ORDER — SODIUM CHLORIDE 0.9 % IV SOLN
INTRAVENOUS | Status: DC
  Filled 2024-07-03: qty 250

## 2024-07-03 MED ORDER — SODIUM CHLORIDE 0.9 % IV SOLN
40.0000 mg/m2 | Freq: Once | INTRAVENOUS | Status: AC
  Administered 2024-07-03: 72 mg via INTRAVENOUS
  Filled 2024-07-03: qty 3.6

## 2024-07-03 MED ORDER — PROCHLORPERAZINE MALEATE 10 MG PO TABS
10.0000 mg | ORAL_TABLET | Freq: Once | ORAL | Status: AC
  Administered 2024-07-03: 10 mg via ORAL
  Filled 2024-07-03: qty 1

## 2024-07-04 ENCOUNTER — Inpatient Hospital Stay

## 2024-07-04 VITALS — BP 93/54 | HR 79 | Temp 98.1°F | Resp 18

## 2024-07-04 DIAGNOSIS — Z5111 Encounter for antineoplastic chemotherapy: Secondary | ICD-10-CM | POA: Diagnosis not present

## 2024-07-04 DIAGNOSIS — C8591 Non-Hodgkin lymphoma, unspecified, lymph nodes of head, face, and neck: Secondary | ICD-10-CM

## 2024-07-04 MED ORDER — SODIUM CHLORIDE 0.9 % IV SOLN
40.0000 mg/m2 | Freq: Once | INTRAVENOUS | Status: AC
  Administered 2024-07-04: 72 mg via INTRAVENOUS
  Filled 2024-07-04: qty 3.6

## 2024-07-04 MED ORDER — PROCHLORPERAZINE MALEATE 10 MG PO TABS
10.0000 mg | ORAL_TABLET | Freq: Once | ORAL | Status: AC
  Administered 2024-07-04: 10 mg via ORAL
  Filled 2024-07-04: qty 1

## 2024-07-04 MED ORDER — SODIUM CHLORIDE 0.9 % IV SOLN
INTRAVENOUS | Status: DC
  Filled 2024-07-04: qty 250

## 2024-07-05 ENCOUNTER — Inpatient Hospital Stay

## 2024-07-05 DIAGNOSIS — C8591 Non-Hodgkin lymphoma, unspecified, lymph nodes of head, face, and neck: Secondary | ICD-10-CM

## 2024-07-05 DIAGNOSIS — Z5111 Encounter for antineoplastic chemotherapy: Secondary | ICD-10-CM | POA: Diagnosis not present

## 2024-07-05 MED ORDER — PEGFILGRASTIM-CBQV 6 MG/0.6ML ~~LOC~~ SOSY
6.0000 mg | PREFILLED_SYRINGE | Freq: Once | SUBCUTANEOUS | Status: AC
  Administered 2024-07-05: 6 mg via SUBCUTANEOUS
  Filled 2024-07-05: qty 0.6

## 2024-07-10 ENCOUNTER — Other Ambulatory Visit: Payer: Self-pay | Admitting: *Deleted

## 2024-07-10 MED ORDER — APIXABAN 5 MG PO TABS: 5.0000 mg | ORAL_TABLET | Freq: Two times a day (BID) | ORAL | 0 refills | Status: AC

## 2024-07-10 NOTE — Telephone Encounter (Signed)
 Patient called triage and requested a RF on eliquis . Script pended. (Previously prescribed by inpatient provider)

## 2024-07-19 ENCOUNTER — Other Ambulatory Visit: Payer: Self-pay | Admitting: Internal Medicine

## 2024-07-20 ENCOUNTER — Other Ambulatory Visit: Payer: Self-pay

## 2024-07-23 ENCOUNTER — Inpatient Hospital Stay

## 2024-07-23 ENCOUNTER — Inpatient Hospital Stay: Admitting: Internal Medicine

## 2024-07-24 ENCOUNTER — Inpatient Hospital Stay

## 2024-07-25 ENCOUNTER — Inpatient Hospital Stay

## 2024-07-26 ENCOUNTER — Inpatient Hospital Stay

## 2024-07-30 ENCOUNTER — Ambulatory Visit: Admitting: Radiation Oncology

## 2024-08-06 ENCOUNTER — Inpatient Hospital Stay: Admitting: Internal Medicine

## 2024-08-06 ENCOUNTER — Inpatient Hospital Stay: Attending: Internal Medicine

## 2024-08-06 ENCOUNTER — Inpatient Hospital Stay

## 2024-08-07 ENCOUNTER — Inpatient Hospital Stay

## 2024-08-08 ENCOUNTER — Inpatient Hospital Stay

## 2024-08-09 ENCOUNTER — Inpatient Hospital Stay

## 2024-08-09 ENCOUNTER — Ambulatory Visit: Admitting: Radiation Oncology

## 2024-08-10 ENCOUNTER — Inpatient Hospital Stay
